# Patient Record
Sex: Female | Born: 1940
Health system: Southern US, Community
[De-identification: ages and names within clinical notes are randomized; demographics above are authoritative.]

## PROBLEM LIST (undated history)

## (undated) DIAGNOSIS — Z973 Presence of spectacles and contact lenses: Secondary | ICD-10-CM

## (undated) DIAGNOSIS — R351 Nocturia: Secondary | ICD-10-CM

## (undated) DIAGNOSIS — R0600 Dyspnea, unspecified: Secondary | ICD-10-CM

## (undated) DIAGNOSIS — C679 Malignant neoplasm of bladder, unspecified: Secondary | ICD-10-CM

## (undated) DIAGNOSIS — R7989 Other specified abnormal findings of blood chemistry: Secondary | ICD-10-CM

## (undated) DIAGNOSIS — I2692 Saddle embolus of pulmonary artery without acute cor pulmonale: Secondary | ICD-10-CM

## (undated) DIAGNOSIS — R319 Hematuria, unspecified: Secondary | ICD-10-CM

## (undated) DIAGNOSIS — I2699 Other pulmonary embolism without acute cor pulmonale: Secondary | ICD-10-CM

## (undated) DIAGNOSIS — R778 Other specified abnormalities of plasma proteins: Secondary | ICD-10-CM

## (undated) DIAGNOSIS — R350 Frequency of micturition: Secondary | ICD-10-CM

## (undated) DIAGNOSIS — R300 Dysuria: Secondary | ICD-10-CM

## (undated) DIAGNOSIS — I10 Essential (primary) hypertension: Secondary | ICD-10-CM

## (undated) DIAGNOSIS — D649 Anemia, unspecified: Secondary | ICD-10-CM

## (undated) DIAGNOSIS — R0609 Other forms of dyspnea: Secondary | ICD-10-CM

## (undated) DIAGNOSIS — I2510 Atherosclerotic heart disease of native coronary artery without angina pectoris: Secondary | ICD-10-CM

## (undated) DIAGNOSIS — Z972 Presence of dental prosthetic device (complete) (partial): Secondary | ICD-10-CM

## (undated) DIAGNOSIS — I272 Pulmonary hypertension, unspecified: Secondary | ICD-10-CM

## (undated) HISTORY — DX: Pulmonary hypertension, unspecified: I27.20

## (undated) HISTORY — DX: Essential (primary) hypertension: I10

## (undated) HISTORY — DX: Other specified abnormalities of plasma proteins: R77.8

## (undated) HISTORY — DX: Other pulmonary embolism without acute cor pulmonale: I26.99

## (undated) HISTORY — DX: Anemia, unspecified: D64.9

## (undated) HISTORY — DX: Dyspnea, unspecified: R06.00

## (undated) HISTORY — DX: Malignant neoplasm of bladder, unspecified: C67.9

## (undated) HISTORY — DX: Other specified abnormal findings of blood chemistry: R79.89

## (undated) HISTORY — DX: Other forms of dyspnea: R06.09

## (undated) HISTORY — DX: Saddle embolus of pulmonary artery without acute cor pulmonale: I26.92

## (undated) HISTORY — DX: Atherosclerotic heart disease of native coronary artery without angina pectoris: I25.10

## (undated) HISTORY — PX: NO PAST SURGERIES: SHX2092

---

## 2014-10-30 ENCOUNTER — Ambulatory Visit: Payer: Self-pay | Admitting: Internal Medicine

## 2014-11-12 ENCOUNTER — Encounter: Payer: Self-pay | Admitting: *Deleted

## 2014-11-27 ENCOUNTER — Encounter: Payer: Self-pay | Admitting: Internal Medicine

## 2014-11-27 ENCOUNTER — Ambulatory Visit (INDEPENDENT_AMBULATORY_CARE_PROVIDER_SITE_OTHER): Payer: PPO | Admitting: Internal Medicine

## 2014-11-27 ENCOUNTER — Ambulatory Visit: Payer: Self-pay | Admitting: Internal Medicine

## 2014-11-27 VITALS — BP 140/100 | HR 75 | Temp 98.0°F | Resp 20 | Ht 63.5 in | Wt 175.0 lb

## 2014-11-27 DIAGNOSIS — Z23 Encounter for immunization: Secondary | ICD-10-CM | POA: Diagnosis not present

## 2014-11-27 DIAGNOSIS — I10 Essential (primary) hypertension: Secondary | ICD-10-CM

## 2014-11-27 DIAGNOSIS — N814 Uterovaginal prolapse, unspecified: Secondary | ICD-10-CM | POA: Diagnosis not present

## 2014-11-27 DIAGNOSIS — R319 Hematuria, unspecified: Secondary | ICD-10-CM | POA: Diagnosis not present

## 2014-11-27 HISTORY — DX: Essential (primary) hypertension: I10

## 2014-11-27 MED ORDER — AMLODIPINE BESYLATE 10 MG PO TABS: 10.0000 mg | ORAL_TABLET | Freq: Every day | ORAL | Status: DC

## 2014-11-27 NOTE — Progress Notes (Signed)
Patient ID: Catherine Snyder, female   DOB: Mar 20, 1940, 74 y.o.   MRN: [id number]    Location:    [facility]   Place of Service:  OFFICE    Advanced Directive information Does patient have an advance directive?: No, Would patient like information on creating an advanced directive?: Yes - Educational materials given  Chief Complaint  Patient presents with  . Establish Care    New patient Establish care  . Medical Management of Chronic Issues    Hypertensiion    HPI:  74 yo female seen today as a new pt. She has a hx HTN but has been out of med x several weeks. She was taking amlodipine 10mg  daily.   She has intermittent hematuria (passes blood clot) at end of urine stream x 1 yr. No dysuria but has increased frequency. Non smoker. She has never seen a urologist. She c/o prolapse uterus and would like to be seen by GYN.  She relocated from [place] in March 2015 and no labs since then  Past Medical History  Diagnosis Date  . Hypertension     History reviewed. No pertinent past surgical history.  Patient Care Team: Gildardo Cranker, DO as PCP - General (Internal Medicine)  Social History   Social History  . Marital Status: Divorced    Spouse Name: N/A  . Number of Children: N/A  . Years of Education: N/A   Occupational History  . Not on file.   Social History Main Topics  . Smoking status: Never Smoker   . Smokeless tobacco: Never Used  . Alcohol Use: No  . Drug Use: Not on file  . Sexual Activity: Not on file   Other Topics Concern  . Not on file   Social History Narrative    Diet:   Do you drink/eat things with caffeine? Yes coffee   Marital status: Divorced                              What year were you married?    Do you live in a house, apartment, assisted living, condo, trailer, etc)? House   Is it one or more stories? 1 story   How many persons live in your home? 4   Do you have any pets in your home? 0   Current or past profession: Dietitian   Do you exercise?  Yes                                       Type & how often:  Walk daily   Do you have a living will? No   Do you have a DNR Form? No   Do you have a POA/HPOA forms? No     reports that she has never smoked. She has never used smokeless tobacco. She reports that she does not drink alcohol. Her drug history is not on file.  Family History  Problem Relation Age of Onset  . Heart disease Father    Family Status  Relation Status Death Age  . Mother Deceased   . Father Deceased   . Sister Alive   . Brother Alive   . Daughter Alive   . Son Alive   . Sister Alive   . Sister Alive   . Brother Deceased   . Brother Deceased   . Brother Deceased   .  Brother Deceased   . Brother Deceased      There is no immunization history on file for this patient.  No Known Allergies  Medications: Patient's Medications  New Prescriptions   No medications on file  Previous Medications   AMLODIPINE (NORVASC) 10 MG TABLET    Take 10 mg by mouth daily.   ASPIRIN 81 MG CHEWABLE TABLET    Chew 81 mg by mouth daily.   CHOLECALCIFEROL (VITAMIN D) 1000 UNITS TABLET    Take 1,000 Units by mouth daily.   IBUPROFEN (ADVIL,MOTRIN) 200 MG TABLET    Take 200 mg by mouth as needed.   MULTIPLE VITAMINS-MINERALS (ONE-A-DAY WOMENS 50+ ADVANTAGE) TABS    Take 1 tablet by mouth daily.  Modified Medications   No medications on file  Discontinued Medications   No medications on file    Review of Systems  Constitutional: Negative for fever, chills, diaphoresis, activity change, appetite change and fatigue.  HENT: Positive for dental problem (dentures; upper). Negative for ear pain and sore throat.   Eyes: Positive for visual disturbance (wears glasses; has cats).  Respiratory: Negative for cough, chest tightness and shortness of breath.   Cardiovascular: Negative for chest pain, palpitations and leg swelling.  Gastrointestinal: Negative for nausea, vomiting, abdominal pain, diarrhea, constipation and blood in  stool.  Genitourinary: Positive for frequency and hematuria. Negative for dysuria.  Musculoskeletal: Positive for back pain. Negative for arthralgias.  Neurological: Negative for dizziness, tremors, numbness and headaches.  Psychiatric/Behavioral: Negative for sleep disturbance. The patient is not nervous/anxious.     Filed Vitals:   11/27/14 0854  BP: 140/100  Pulse: 75  Temp: 98 F (36.7 C)  TempSrc: Oral  Resp: 20  Height: 5' 3.5" (1.613 m)  Weight: 175 lb (79.379 kg)  SpO2: 97%   Body mass index is 30.51 kg/(m^2).  Physical Exam  Constitutional: She is oriented to person, place, and time. She appears well-developed and well-nourished.  HENT:  Mouth/Throat: Oropharynx is clear and moist. No oropharyngeal exudate.  Eyes: Pupils are equal, round, and reactive to light. No scleral icterus.  Neck: Neck supple. Carotid bruit is not present. No tracheal deviation present. No thyromegaly present.  Cardiovascular: Normal rate, regular rhythm, normal heart sounds and intact distal pulses.  Exam reveals no gallop and no friction rub.   No murmur heard. trace LE edema b/l. no calf TTP.   Pulmonary/Chest: Effort normal and breath sounds normal. No stridor. No respiratory distress. She has no wheezes. She has no rales.  Abdominal: Soft. Bowel sounds are normal. She exhibits no distension and no mass. There is no hepatomegaly. There is no tenderness. There is no rebound and no guarding.  Lymphadenopathy:    She has no cervical adenopathy.  Neurological: She is alert and oriented to person, place, and time.  Skin: Skin is warm and dry. No rash noted.  Psychiatric: She has a normal mood and affect. Her behavior is normal. Judgment and thought content normal.     Labs reviewed: No results found for any previous visit.  No results found.   Assessment/Plan   ICD-9-CM ICD-10-CM   1. Essential hypertension - uncontrolled; off meds 401.9 I10 amLODipine (NORVASC) 10 MG tablet     CBC  with Differential     CMP     TSH     Lipid Panel  2. Hematuria - etiology unknown but possibly due to #3 599.70 R31.9 CMP     TSH  3. Prolapsed uterus 618.1 N81.4 Ambulatory  referral to Gynecology     Urinalysis with Reflex Microscopic    --flu shot today  --cont current meds as ordered  --fasting labs today  --f/u in 1 mo for CPE/MMSE/ECG  Lashawna Poche S. Perlie Gold  [facility] [address] [place], [place] [address] [phone] Cell (Monday-Friday 8 AM - 5 PM) [phone] After 5 PM and follow prompts

## 2014-11-27 NOTE — Patient Instructions (Signed)
Flu shot given today  Take all medications as ordered  Follow up in 1 month for CPE  Will call with lab results/GYN referral

## 2014-11-28 LAB — COMPREHENSIVE METABOLIC PANEL
ALBUMIN: 4.1 g/dL (ref 3.5–4.8)
ALT: 16 IU/L (ref 0–32)
AST: 24 IU/L (ref 0–40)
Albumin/Globulin Ratio: 1.3 (ref 1.1–2.5)
Alkaline Phosphatase: 110 IU/L (ref 39–117)
BILIRUBIN TOTAL: 0.5 mg/dL (ref 0.0–1.2)
BUN / CREAT RATIO: 18 (ref 11–26)
BUN: 13 mg/dL (ref 8–27)
CALCIUM: 9.5 mg/dL (ref 8.7–10.3)
CHLORIDE: 102 mmol/L (ref 97–108)
CO2: 23 mmol/L (ref 18–29)
Creatinine, Ser: 0.73 mg/dL (ref 0.57–1.00)
GFR, EST AFRICAN AMERICAN: 95 mL/min/{1.73_m2} (ref >59)
GFR, EST NON AFRICAN AMERICAN: 83 mL/min/{1.73_m2} (ref >59)
GLUCOSE: 111 mg/dL — AB (ref 65–99)
Globulin, Total: 3.2 g/dL (ref 1.5–4.5)
Potassium: 3.9 mmol/L (ref 3.5–5.2)
Sodium: 142 mmol/L (ref 134–144)
TOTAL PROTEIN: 7.3 g/dL (ref 6.0–8.5)

## 2014-11-28 LAB — MICROSCOPIC EXAMINATION
Casts: NONE SEEN /lpf
RBC, UA: >30 /hpf — AB (ref 0–?)

## 2014-11-28 LAB — CBC WITH DIFFERENTIAL/PLATELET
BASOS: 1 %
Basophils Absolute: 0 10*3/uL (ref 0.0–0.2)
EOS (ABSOLUTE): 0.1 10*3/uL (ref 0.0–0.4)
Eos: 3 %
HEMOGLOBIN: 12.7 g/dL (ref 11.1–15.9)
Hematocrit: 37.9 % (ref 34.0–46.6)
IMMATURE GRANS (ABS): 0 10*3/uL (ref 0.0–0.1)
Immature Granulocytes: 0 %
LYMPHS: 36 %
Lymphocytes Absolute: 1.3 10*3/uL (ref 0.7–3.1)
MCH: 28.9 pg (ref 26.6–33.0)
MCHC: 33.5 g/dL (ref 31.5–35.7)
MCV: 86 fL (ref 79–97)
MONOCYTES: 11 %
Monocytes Absolute: 0.4 10*3/uL (ref 0.1–0.9)
NEUTROS ABS: 1.8 10*3/uL (ref 1.4–7.0)
Neutrophils: 49 %
Platelets: 258 10*3/uL (ref 150–379)
RBC: 4.39 x10E6/uL (ref 3.77–5.28)
RDW: 14.6 % (ref 12.3–15.4)
WBC: 3.6 10*3/uL (ref 3.4–10.8)

## 2014-11-28 LAB — URINALYSIS, ROUTINE W REFLEX MICROSCOPIC
Bilirubin, UA: NEGATIVE
Glucose, UA: NEGATIVE
Ketones, UA: NEGATIVE
Leukocytes, UA: NEGATIVE
NITRITE UA: NEGATIVE
PH UA: 5.5 (ref 5.0–7.5)
Specific Gravity, UA: 1.017 (ref 1.005–1.030)
UUROB: 0.2 mg/dL (ref 0.2–1.0)

## 2014-11-28 LAB — LIPID PANEL
CHOLESTEROL TOTAL: 182 mg/dL (ref 100–199)
Chol/HDL Ratio: 4.2 ratio units (ref 0.0–4.4)
HDL: 43 mg/dL (ref >39)
LDL CALC: 113 mg/dL — AB (ref 0–99)
TRIGLYCERIDES: 128 mg/dL (ref 0–149)
VLDL CHOLESTEROL CAL: 26 mg/dL (ref 5–40)

## 2014-11-28 LAB — TSH: TSH: 0.914 u[IU]/mL (ref 0.450–4.500)

## 2014-12-05 ENCOUNTER — Other Ambulatory Visit: Payer: Self-pay

## 2014-12-05 DIAGNOSIS — R319 Hematuria, unspecified: Secondary | ICD-10-CM

## 2015-01-03 ENCOUNTER — Encounter: Payer: Self-pay | Admitting: Internal Medicine

## 2015-02-26 ENCOUNTER — Encounter: Payer: PPO | Admitting: Internal Medicine

## 2015-04-04 ENCOUNTER — Encounter: Payer: Self-pay | Admitting: Internal Medicine

## 2015-04-04 ENCOUNTER — Ambulatory Visit (INDEPENDENT_AMBULATORY_CARE_PROVIDER_SITE_OTHER): Payer: PPO | Admitting: Internal Medicine

## 2015-04-04 VITALS — BP 132/92 | HR 68 | Temp 98.1°F | Resp 20 | Ht 64.0 in | Wt 171.0 lb

## 2015-04-04 DIAGNOSIS — I10 Essential (primary) hypertension: Secondary | ICD-10-CM

## 2015-04-04 DIAGNOSIS — R319 Hematuria, unspecified: Secondary | ICD-10-CM

## 2015-04-04 DIAGNOSIS — H60391 Other infective otitis externa, right ear: Secondary | ICD-10-CM

## 2015-04-04 LAB — POCT URINALYSIS DIPSTICK

## 2015-04-04 MED ORDER — NEOMYCIN-POLYMYXIN-HC 3.5-10000-1 OT SOLN: 4.0000 [drp] | Freq: Three times a day (TID) | OTIC | Status: DC

## 2015-04-04 NOTE — Progress Notes (Signed)
Patient ID: Catherine Snyder, female   DOB: March 12, 1941, 75 y.o.   MRN: [id number]    Location:    [facility]   Place of Service:   OFFICE  Chief Complaint  Patient presents with  . Acute Visit    Patients c/o blood in urine patient says she is also passing clots thruogh her urine and having pain in right  ear  . OTHER    Daughter in room with patient    HPI:  75 yo female seen today for gross hematuria. She says urine is dark colored (cola-like) all week. She denies N/V, abdominal pain, f/c, dysuria, urgency/frequency. She saw GYN and no further w/u needed. No hx kidney problems. She has HTN. No personal hx tobacco abuse. She cancelled her appt to see urology when it was scheduled after her last visit in September 2016.  She also c/o episode of severe right otalgia. No hearing loss. (+) tinnitus. No d/c.  Daughter and infant great granddaughter present  Past Medical History  Diagnosis Date  . Hypertension     History reviewed. No pertinent past surgical history.  Patient Care Team: Gildardo Cranker, DO as PCP - General (Internal Medicine)  Social History   Social History  . Marital Status: Divorced    Spouse Name: N/A  . Number of Children: N/A  . Years of Education: N/A   Occupational History  . Not on file.   Social History Main Topics  . Smoking status: Never Smoker   . Smokeless tobacco: Never Used  . Alcohol Use: No  . Drug Use: Not on file  . Sexual Activity: Not on file   Other Topics Concern  . Not on file   Social History Narrative    Diet:   Do you drink/eat things with caffeine? Yes coffee   Marital status: Divorced                              What year were you married?    Do you live in a house, apartment, assisted living, condo, trailer, etc)? House   Is it one or more stories? 1 story   How many persons live in your home? 4   Do you have any pets in your home? 0   Current or past profession: Dietitian   Do you exercise? Yes                                        Type & how often:  Walk daily   Do you have a living will? No   Do you have a DNR Form? No   Do you have a POA/HPOA forms? No     reports that she has never smoked. She has never used smokeless tobacco. She reports that she does not drink alcohol. Her drug history is not on file.  No Known Allergies  Medications: Patient's Medications  New Prescriptions   No medications on file  Previous Medications   AMLODIPINE (NORVASC) 10 MG TABLET    Take 1 tablet (10 mg total) by mouth daily.   ASPIRIN 81 MG CHEWABLE TABLET    Chew 81 mg by mouth daily.   CHOLECALCIFEROL (VITAMIN D) 1000 UNITS TABLET    Take 1,000 Units by mouth daily.   IBUPROFEN (ADVIL,MOTRIN) 200 MG TABLET    Take 200 mg by mouth as  needed.   MULTIPLE VITAMINS-MINERALS (ONE-A-DAY WOMENS 50+ ADVANTAGE) TABS    Take 1 tablet by mouth daily.  Modified Medications   No medications on file  Discontinued Medications   No medications on file    Review of Systems  HENT: Positive for tinnitus.   Genitourinary: Positive for hematuria.  All other systems reviewed and are negative.   Filed Vitals:   04/04/15 1141  BP: 132/92  Pulse: 68  Temp: 98.1 F (36.7 C)  TempSrc: Oral  Resp: 20  Height: 5\' 4"  (1.626 m)  Weight: 171 lb (77.565 kg)  SpO2: 97%   Body mass index is 29.34 kg/(m^2).  Physical Exam  Constitutional: She is oriented to person, place, and time. She appears well-developed and well-nourished. No distress.  HENT:  Left TM airfluid level but no redness of d/c in external ear canal. Right external ear canal swelling with yellowish d/c and TTP. TM intact b/l without bulging  Cardiovascular: Normal rate, regular rhythm and intact distal pulses.  Exam reveals no gallop and no friction rub.   Murmur (1/6 SEM) heard. No LE edema b/l. No calf TTP  Pulmonary/Chest: Effort normal and breath sounds normal. No respiratory distress. She has no wheezes. She has no rales. She exhibits no tenderness.  Abdominal:  Soft. Bowel sounds are normal. She exhibits no distension and no mass. There is no tenderness (no CVAT or suprapubic TTP). There is no rebound and no guarding.  No pulsatile mass or bruit noted  Neurological: She is oriented to person, place, and time.  Skin: Skin is warm and dry. No rash noted.  Psychiatric: She has a normal mood and affect. Her behavior is normal. Thought content normal.     Labs reviewed: No visits with results within 3 Month(s) from this visit. Latest known visit with results is:  Office Visit on 11/27/2014  Component Date Value Ref Range Status  . WBC 11/27/2014 3.6  3.4 - 10.8 x10E3/uL Final  . RBC 11/27/2014 4.39  3.77 - 5.28 x10E6/uL Final  . Hemoglobin 11/27/2014 12.7  11.1 - 15.9 g/dL Final  . Hematocrit 11/27/2014 37.9  34.0 - 46.6 % Final  . MCV 11/27/2014 86  79 - 97 fL Final  . MCH 11/27/2014 28.9  26.6 - 33.0 pg Final  . MCHC 11/27/2014 33.5  31.5 - 35.7 g/dL Final  . RDW 11/27/2014 14.6  12.3 - 15.4 % Final  . Platelets 11/27/2014 258  150 - 379 x10E3/uL Final  . Neutrophils 11/27/2014 49   Final  . Lymphs 11/27/2014 36   Final  . Monocytes 11/27/2014 11   Final  . Eos 11/27/2014 3   Final  . Basos 11/27/2014 1   Final  . Neutrophils Absolute 11/27/2014 1.8  1.4 - 7.0 x10E3/uL Final  . Lymphocytes Absolute 11/27/2014 1.3  0.7 - 3.1 x10E3/uL Final  . Monocytes Absolute 11/27/2014 0.4  0.1 - 0.9 x10E3/uL Final  . EOS (ABSOLUTE) 11/27/2014 0.1  0.0 - 0.4 x10E3/uL Final  . Basophils Absolute 11/27/2014 0.0  0.0 - 0.2 x10E3/uL Final  . Immature Granulocytes 11/27/2014 0   Final  . Immature Grans (Abs) 11/27/2014 0.0  0.0 - 0.1 x10E3/uL Final  . Glucose 11/27/2014 111* 65 - 99 mg/dL Final  . BUN 11/27/2014 13  8 - 27 mg/dL Final  . Creatinine, Ser 11/27/2014 0.73  0.57 - 1.00 mg/dL Final  . GFR calc non Af Amer 11/27/2014 83  >59 mL/min/1.73 Final  . GFR calc Af Amer 11/27/2014 95  >  59 mL/min/1.73 Final  . BUN/Creatinine Ratio 11/27/2014 18  11 - 26  Final  . Sodium 11/27/2014 142  134 - 144 mmol/L Final  . Potassium 11/27/2014 3.9  3.5 - 5.2 mmol/L Final  . Chloride 11/27/2014 102  97 - 108 mmol/L Final  . CO2 11/27/2014 23  18 - 29 mmol/L Final  . Calcium 11/27/2014 9.5  8.7 - 10.3 mg/dL Final  . Total Protein 11/27/2014 7.3  6.0 - 8.5 g/dL Final  . Albumin 11/27/2014 4.1  3.5 - 4.8 g/dL Final  . Globulin, Total 11/27/2014 3.2  1.5 - 4.5 g/dL Final  . Albumin/Globulin Ratio 11/27/2014 1.3  1.1 - 2.5 Final  . Bilirubin Total 11/27/2014 0.5  0.0 - 1.2 mg/dL Final  . Alkaline Phosphatase 11/27/2014 110  39 - 117 IU/L Final  . AST 11/27/2014 24  0 - 40 IU/L Final  . ALT 11/27/2014 16  0 - 32 IU/L Final  . TSH 11/27/2014 0.914  0.450 - 4.500 uIU/mL Final  . Specific Gravity, UA 11/27/2014 1.017  1.005 - 1.030 Final  . pH, UA 11/27/2014 5.5  5.0 - 7.5 Final  . Color, UA 11/27/2014 Yellow  Yellow Final  . Appearance Ur 11/27/2014 Cloudy* Clear Final  . Leukocytes, UA 11/27/2014 Negative  Negative Final  . Protein, UA 11/27/2014 1+* Negative/Trace Final  . Glucose, UA 11/27/2014 Negative  Negative Final  . Ketones, UA 11/27/2014 Negative  Negative Final  . RBC, UA 11/27/2014 3+* Negative Final  . Bilirubin, UA 11/27/2014 Negative  Negative Final  . Urobilinogen, Ur 11/27/2014 0.2  0.2 - 1.0 mg/dL Final  . Nitrite, UA 11/27/2014 Negative  Negative Final  . Microscopic Examination 11/27/2014 See below:   Final   Microscopic was indicated and was performed.  . Cholesterol, Total 11/27/2014 182  100 - 199 mg/dL Final  . Triglycerides 11/27/2014 128  0 - 149 mg/dL Final  . HDL 11/27/2014 43  >39 mg/dL Final   Comment: According to ATP-III Guidelines, HDL-C >59 mg/dL is considered a negative risk factor for CHD.   Marland Kitchen VLDL Cholesterol Cal 11/27/2014 26  5 - 40 mg/dL Final  . LDL Calculated 11/27/2014 113* 0 - 99 mg/dL Final  . Chol/HDL Ratio 11/27/2014 4.2  0.0 - 4.4 ratio units Final   Comment:                                   T.  Chol/HDL Ratio                                             Men  Women                               1/2 Avg.Risk  3.4    3.3                                   Avg.Risk  5.0    4.4                                2X Avg.Risk  9.6    7.1  3X Avg.Risk 23.4   11.0   . WBC, UA 11/27/2014 0-5  0 -  5 /hpf Final  . RBC, UA 11/27/2014 >30* 0 -  2 /hpf Final  . Epithelial Cells (non renal) 11/27/2014 0-10  0 - 10 /hpf Final  . Casts 11/27/2014 None seen  None seen /lpf Final  . Mucus, UA 11/27/2014 Present  Not Estab. Final  . Bacteria, UA 11/27/2014 Many* None seen/Few Final    No results found.   Assessment/Plan   ICD-9-CM ICD-10-CM   1. Hematuria 599.70 [id number] Basic Metabolic Panel     CBC with Differential  2. Otitis, externa, infective, right 380.10 H60.391 neomycin-polymyxin-hydrocortisone (CORTISPORIN) otic solution  3. Essential hypertension 401.9 [id number] Basic Metabolic Panel   Use ear drops for 1 week  Continue current medications as ordered  Will call with urology referral. Emphasized importance of her following through with appt. She verbalized understanding  Follow up as scheduled for CPE  [facility] S. Perlie Gold  [facility] and [facility] [address] [place], [place] [address] [phone] Cell (Monday-Friday 8 AM - 5 PM) [phone] After 5 PM and follow prompts

## 2015-04-04 NOTE — Patient Instructions (Signed)
Continue current medications as ordered  Will call with urology referral  Follow up as scheduled for CPE

## 2015-04-05 LAB — CBC WITH DIFFERENTIAL/PLATELET
Basophils Absolute: 0 10*3/uL (ref 0.0–0.2)
Basos: 0 %
EOS (ABSOLUTE): 0.1 10*3/uL (ref 0.0–0.4)
Eos: 2 %
HEMATOCRIT: 37.7 % (ref 34.0–46.6)
HEMOGLOBIN: 12.8 g/dL (ref 11.1–15.9)
Immature Grans (Abs): 0 10*3/uL (ref 0.0–0.1)
Immature Granulocytes: 0 %
LYMPHS ABS: 1.3 10*3/uL (ref 0.7–3.1)
Lymphs: 32 %
MCH: 29.8 pg (ref 26.6–33.0)
MCHC: 34 g/dL (ref 31.5–35.7)
MCV: 88 fL (ref 79–97)
Monocytes Absolute: 0.5 10*3/uL (ref 0.1–0.9)
Monocytes: 11 %
NEUTROS ABS: 2.2 10*3/uL (ref 1.4–7.0)
Neutrophils: 55 %
Platelets: 306 10*3/uL (ref 150–379)
RBC: 4.3 x10E6/uL (ref 3.77–5.28)
RDW: 14.9 % (ref 12.3–15.4)
WBC: 4.1 10*3/uL (ref 3.4–10.8)

## 2015-04-05 LAB — BASIC METABOLIC PANEL
BUN / CREAT RATIO: 18 (ref 11–26)
BUN: 12 mg/dL (ref 8–27)
CHLORIDE: 101 mmol/L (ref 96–106)
CO2: 24 mmol/L (ref 18–29)
Calcium: 9.3 mg/dL (ref 8.7–10.3)
Creatinine, Ser: 0.67 mg/dL (ref 0.57–1.00)
GFR calc Af Amer: 101 mL/min/{1.73_m2} (ref >59)
GFR calc non Af Amer: 87 mL/min/{1.73_m2} (ref >59)
GLUCOSE: 106 mg/dL — AB (ref 65–99)
Potassium: 3.8 mmol/L (ref 3.5–5.2)
SODIUM: 142 mmol/L (ref 134–144)

## 2015-04-06 LAB — URINE CULTURE: Organism ID, Bacteria: NO GROWTH

## 2015-05-07 ENCOUNTER — Encounter: Payer: Self-pay | Admitting: Internal Medicine

## 2015-05-07 ENCOUNTER — Ambulatory Visit (INDEPENDENT_AMBULATORY_CARE_PROVIDER_SITE_OTHER): Payer: PPO | Admitting: Internal Medicine

## 2015-05-07 VITALS — BP 138/80 | HR 68 | Temp 98.1°F | Resp 20 | Ht 64.0 in | Wt 174.8 lb

## 2015-05-07 DIAGNOSIS — I10 Essential (primary) hypertension: Secondary | ICD-10-CM | POA: Diagnosis not present

## 2015-05-07 DIAGNOSIS — Z0000 Encounter for general adult medical examination without abnormal findings: Secondary | ICD-10-CM | POA: Diagnosis not present

## 2015-05-07 DIAGNOSIS — R319 Hematuria, unspecified: Secondary | ICD-10-CM | POA: Diagnosis not present

## 2015-05-07 NOTE — Patient Instructions (Signed)
Encouraged her to exercise 30-45 minutes 4-5 times per week. Eat a well balanced diet. Avoid smoking. Limit alcohol intake. Wear seatbelt when riding in the car. Wear sun block (SPF >50) when spending extended times outside.  Follow up in 6 mos for routine visit.   Keep appt with urology as scheduled

## 2015-05-07 NOTE — Progress Notes (Signed)
Patient ID: Catherine Snyder, female   DOB: [birth year], 76 y.o.   MRN: [id number] Subjective:     Catherine Snyder is a 75 y.o. female and is here for a comprehensive physical exam. The patient reports problems - dysuria withoccasional clots in urine. She has appt to see urology on March 13th.  HTN - BP controlled on amlodipine. She takes ASA daily  She has intermittent hematuria (passes blood clot) at end of urine stream x 1 yr. No dysuria but has increased frequency. Non smoker.   She relocated from [place] in March 2015  Past Medical History  Diagnosis Date  . Hypertension    No past surgical history on file. Family History  Problem Relation Age of Onset  . Heart disease Father     Social History   Social History  . Marital Status: Divorced    Spouse Name: N/A  . Number of Children: N/A  . Years of Education: N/A   Occupational History  . Not on file.   Social History Main Topics  . Smoking status: Never Smoker   . Smokeless tobacco: Never Used  . Alcohol Use: No  . Drug Use: Not on file  . Sexual Activity: Not on file   Other Topics Concern  . Not on file   Social History Narrative    Diet:   Do you drink/eat things with caffeine? Yes coffee   Marital status: Divorced                              What year were you married?    Do you live in a house, apartment, assisted living, condo, trailer, etc)? House   Is it one or more stories? 1 story   How many persons live in your home? 4   Do you have any pets in your home? 0   Current or past profession: Dietitian   Do you exercise? Yes                                       Type & how often:  Walk daily   Do you have a living will? No   Do you have a DNR Form? No   Do you have a POA/HPOA forms? No   Health Maintenance  Topic Date Due  . MAMMOGRAM  01/08/1960  . TETANUS/TDAP  01/07/1961  . COLONOSCOPY  01/08/1992  . ZOSTAVAX  01/07/2002  . DEXA SCAN  01/08/2007  . PNA vac Low Risk Adult (1 of 2 - PCV13) 01/08/2007   . INFLUENZA VACCINE  10/14/2015   Current Outpatient Prescriptions on File Prior to Visit  Medication Sig Dispense Refill  . amLODipine (NORVASC) 10 MG tablet Take 1 tablet (10 mg total) by mouth daily. 90 tablet 1  . aspirin 81 MG chewable tablet Chew 81 mg by mouth daily.    . cholecalciferol (VITAMIN D) 1000 UNITS tablet Take 1,000 Units by mouth daily.    Marland Kitchen ibuprofen (ADVIL,MOTRIN) 200 MG tablet Take 200 mg by mouth as needed.    . Multiple Vitamins-Minerals (ONE-A-DAY WOMENS 50+ ADVANTAGE) TABS Take 1 tablet by mouth daily.    Marland Kitchen neomycin-polymyxin-hydrocortisone (CORTISPORIN) otic solution Place 4 drops into the right ear 3 (three) times daily. Use for 1 week 10 mL 0   No current facility-administered medications on file prior to visit.  Review of Systems   Review of Systems  Constitutional: Negative for fever, chills and malaise/fatigue.  HENT: Negative for sore throat and tinnitus.   Eyes: Negative for blurred vision and double vision.  Respiratory: Negative for cough, shortness of breath and wheezing.   Cardiovascular: Negative for chest pain, palpitations, orthopnea and leg swelling.  Gastrointestinal: Negative for heartburn, nausea, vomiting, abdominal pain, diarrhea, constipation and blood in stool.  Genitourinary: Positive for dysuria (at end of urine stream) and hematuria (occasional clots). Negative for urgency and frequency.  Musculoskeletal: Positive for back pain and joint pain. Negative for myalgias and falls.  Skin: Negative for rash.  Neurological: Negative for dizziness, tingling, tremors, sensory change, focal weakness, seizures, loss of consciousness, weakness and headaches.  Endo/Heme/Allergies: Negative for environmental allergies. Does not bruise/bleed easily.  Psychiatric/Behavioral: Negative for depression and memory loss. The patient is not nervous/anxious and does not have insomnia.      Objective:   Filed Vitals:   05/07/15 1500  BP: 138/80   Pulse: 68  Temp: 98.1 F (36.7 C)  TempSrc: Oral  Resp: 20  Height: 5\' 4"  (1.626 m)  Weight: 174 lb 12.8 oz (79.289 kg)  SpO2: 98%       Physical Exam  Constitutional: She is oriented to person, place, and time and well-developed, well-nourished, and in no distress.  HENT:  Head: Normocephalic and atraumatic.  Right Ear: External ear normal.  Left Ear: External ear normal.  Mouth/Throat: Oropharynx is clear and moist. No oropharyngeal exudate.  Eyes: Conjunctivae and EOM are normal. Pupils are equal, round, and reactive to light. No scleral icterus.  Neck: Normal range of motion. Neck supple. Carotid bruit is not present. No tracheal deviation present. No thyromegaly present.  Cardiovascular: Normal rate, regular rhythm, normal heart sounds and intact distal pulses.  Exam reveals no gallop and no friction rub.   No murmur heard. Pulmonary/Chest: Effort normal and breath sounds normal. She has no wheezes. She has no rhonchi. She has no rales. She exhibits no tenderness. Right breast exhibits no inverted nipple, no mass, no nipple discharge, no skin change and no tenderness. Left breast exhibits no inverted nipple, no mass, no nipple discharge, no skin change and no tenderness. Breasts are symmetrical.  Abdominal: Soft. Bowel sounds are normal. She exhibits no distension and no mass. There is no hepatosplenomegaly. There is no tenderness. There is no rebound and no guarding.  Musculoskeletal: Normal range of motion.  Lymphadenopathy:    She has no cervical adenopathy.  Neurological: She is alert and oriented to person, place, and time. She has normal reflexes. Gait normal.  Skin: Skin is warm and dry. No rash noted.  Psychiatric: Mood, memory, affect and judgment normal.   Recent Results (from the past 2160 hour(s))  Basic Metabolic Panel     Status: Abnormal   Collection Time: 04/04/15 12:42 PM  Result Value Ref Range   Glucose 106 (H) 65 - 99 mg/dL   BUN 12 8 - 27 mg/dL    Creatinine, Ser 0.67 0.57 - 1.00 mg/dL   GFR calc non Af Catherine 87 >59 mL/min/1.73   GFR calc Af Catherine 101 >59 mL/min/1.73   BUN/Creatinine Ratio 18 11 - 26   Sodium 142 134 - 144 mmol/L   Potassium 3.8 3.5 - 5.2 mmol/L   Chloride 101 96 - 106 mmol/L   CO2 24 18 - 29 mmol/L   Calcium 9.3 8.7 - 10.3 mg/dL  CBC with Differential     Status: None  Collection Time: 04/04/15 12:42 PM  Result Value Ref Range   WBC 4.1 3.4 - 10.8 x10E3/uL   RBC 4.30 3.77 - 5.28 x10E6/uL   Hemoglobin 12.8 11.1 - 15.9 g/dL   Hematocrit 37.7 34.0 - 46.6 %   MCV 88 79 - 97 fL   MCH 29.8 26.6 - 33.0 pg   MCHC 34.0 31.5 - 35.7 g/dL   RDW 14.9 12.3 - 15.4 %   Platelets 306 150 - 379 x10E3/uL   Neutrophils 55 %   Lymphs 32 %   Monocytes 11 %   Eos 2 %   Basos 0 %   Neutrophils Absolute 2.2 1.4 - 7.0 x10E3/uL   Lymphocytes Absolute 1.3 0.7 - 3.1 x10E3/uL   Monocytes Absolute 0.5 0.1 - 0.9 x10E3/uL   EOS (ABSOLUTE) 0.1 0.0 - 0.4 x10E3/uL   Basophils Absolute 0.0 0.0 - 0.2 x10E3/uL   Immature Granulocytes 0 %   Immature Grans (Abs) 0.0 0.0 - 0.1 x10E3/uL  Urine culture     Status: None   Collection Time: 04/04/15  2:09 PM  Result Value Ref Range   Urine Culture, Routine Final report    Urine Culture result 1 No growth   POC Urinalysis Dipstick     Status: Abnormal   Collection Time: 04/04/15  3:27 PM  Result Value Ref Range   Color, UA dk burgundy     Comment: All values are N/A or left blank due to hematuria.   Clarity, UA opaque    Glucose, UA N/A    Bilirubin, UA N/A    Ketones, UA N/A    Spec Grav, UA     Blood, UA Large    pH, UA     Protein, UA N/A    Urobilinogen, UA     Nitrite, UA N/A    Leukocytes, UA  Negative   ECG OBTAINED AND REVIEWED BY MYSELF: NSR @ 66 bpm, PACs, LAD, LAE, poor R wave progression, no acute ischemic changes. No other ECG available to compare   Assessment:    Healthy female exam.       ICD-9-CM ICD-10-CM   1. Well adult exam V70.0 Z00.00   2. Hematuria 599.70  R31.9 Urinalysis with Reflex Microscopic  3. Essential hypertension 401.9 I10   4. Morbid obesity due to excess calories ([place]) 278.01 E66.01     Plan:     See After Visit Summary for Counseling Recommendations     Pt is UTD on health maintenance. Vaccinations are UTD. Pt maintains a healthy lifestyle. Encouraged pt to exercise 30-45 minutes 4-5 times per week. Eat a well balanced diet. Avoid smoking. Limit alcohol intake. Wear seatbelt when riding in the car. Wear sun block (SPF >50) when spending extended times outside.  Follow up in 6 mos for routine visit.   Keep appt with urology as scheduled  [place] S. Perlie Gold  [facility] [address] [place], [place] [address] [phone] Cell (Monday-Friday 8 AM - 5 PM) [phone] After 5 PM and follow prompts

## 2015-05-08 ENCOUNTER — Other Ambulatory Visit: Payer: Self-pay

## 2015-05-08 LAB — MICROSCOPIC EXAMINATION
CASTS: NONE SEEN /LPF
Epithelial Cells (non renal): NONE SEEN /hpf (ref 0–10)

## 2015-05-08 LAB — URINALYSIS, ROUTINE W REFLEX MICROSCOPIC
BILIRUBIN UA: NEGATIVE
GLUCOSE, UA: NEGATIVE
NITRITE UA: POSITIVE — AB
PH UA: 5.5 (ref 5.0–7.5)
Specific Gravity, UA: 1.027 (ref 1.005–1.030)
UUROB: 1 mg/dL (ref 0.2–1.0)

## 2015-05-08 MED ORDER — FLUCONAZOLE 100 MG PO TABS: ORAL_TABLET | ORAL | Status: DC

## 2015-05-10 LAB — URINE CULTURE

## 2015-05-10 LAB — SPECIMEN STATUS REPORT

## 2015-05-26 DIAGNOSIS — R310 Gross hematuria: Secondary | ICD-10-CM | POA: Diagnosis not present

## 2015-05-26 DIAGNOSIS — Z0000 Encounter for general adult medical examination without abnormal findings: Secondary | ICD-10-CM | POA: Diagnosis not present

## 2015-05-26 DIAGNOSIS — R300 Dysuria: Secondary | ICD-10-CM | POA: Diagnosis not present

## 2015-06-05 DIAGNOSIS — R310 Gross hematuria: Secondary | ICD-10-CM | POA: Diagnosis not present

## 2015-06-10 ENCOUNTER — Other Ambulatory Visit: Payer: Self-pay | Admitting: Urology

## 2015-06-10 ENCOUNTER — Telehealth: Payer: Self-pay | Admitting: *Deleted

## 2015-06-10 NOTE — Telephone Encounter (Signed)
Received fax from [facility], Dr. Nicolette Bang [phone] Fax: [phone] for surgery clearance for Transurethral Resection of bladder. Tumor, Bilateral Retragrade Pyelograms. Scheduled for July 07, 2015-[facility]  Given to Dr. Eulas Post to review

## 2015-06-18 DIAGNOSIS — R310 Gross hematuria: Secondary | ICD-10-CM | POA: Diagnosis not present

## 2015-06-18 DIAGNOSIS — N3289 Other specified disorders of bladder: Secondary | ICD-10-CM | POA: Diagnosis not present

## 2015-06-18 DIAGNOSIS — Z0000 Encounter for general adult medical examination without abnormal findings: Secondary | ICD-10-CM | POA: Diagnosis not present

## 2015-07-01 ENCOUNTER — Encounter (HOSPITAL_BASED_OUTPATIENT_CLINIC_OR_DEPARTMENT_OTHER): Payer: Self-pay | Admitting: *Deleted

## 2015-07-01 NOTE — Progress Notes (Addendum)
SPOKE W/ PT AND DAUGHTER .  NPO AFTER MN W/ EXCEPTION CLEAR LIQUIDS UNTIL 0930 (NO CREAM/ MILK PRODUCTS).  ARRIVE AT 1400.  NEEDS ISTAT.  CURRENT EKG IN CHART AND EPIC.  WILL TAKE NORVASC AM DOS W/ SIPS OF WATER.

## 2015-07-07 ENCOUNTER — Encounter (HOSPITAL_BASED_OUTPATIENT_CLINIC_OR_DEPARTMENT_OTHER)
Admission: RE | Disposition: A | Discharge status: Home / Self Care | Payer: Self-pay | Source: Ambulatory Visit | Attending: Urology

## 2015-07-07 ENCOUNTER — Ambulatory Visit (HOSPITAL_BASED_OUTPATIENT_CLINIC_OR_DEPARTMENT_OTHER): Payer: PPO | Admitting: Anesthesiology

## 2015-07-07 ENCOUNTER — Ambulatory Visit (HOSPITAL_BASED_OUTPATIENT_CLINIC_OR_DEPARTMENT_OTHER)
Admission: RE | Admit: 2015-07-07 | Discharge: 2015-07-07 | Disposition: A | Discharge status: Home / Self Care | Payer: PPO | Source: Ambulatory Visit | Attending: Urology | Admitting: Urology

## 2015-07-07 ENCOUNTER — Encounter (HOSPITAL_BASED_OUTPATIENT_CLINIC_OR_DEPARTMENT_OTHER): Payer: Self-pay | Admitting: *Deleted

## 2015-07-07 DIAGNOSIS — Z7982 Long term (current) use of aspirin: Secondary | ICD-10-CM | POA: Insufficient documentation

## 2015-07-07 DIAGNOSIS — N3289 Other specified disorders of bladder: Secondary | ICD-10-CM | POA: Diagnosis not present

## 2015-07-07 DIAGNOSIS — Z79899 Other long term (current) drug therapy: Secondary | ICD-10-CM | POA: Diagnosis not present

## 2015-07-07 DIAGNOSIS — C678 Malignant neoplasm of overlapping sites of bladder: Secondary | ICD-10-CM | POA: Insufficient documentation

## 2015-07-07 DIAGNOSIS — D494 Neoplasm of unspecified behavior of bladder: Secondary | ICD-10-CM | POA: Diagnosis not present

## 2015-07-07 DIAGNOSIS — I10 Essential (primary) hypertension: Secondary | ICD-10-CM | POA: Diagnosis not present

## 2015-07-07 DIAGNOSIS — C679 Malignant neoplasm of bladder, unspecified: Secondary | ICD-10-CM | POA: Diagnosis not present

## 2015-07-07 HISTORY — DX: Dysuria: R30.0

## 2015-07-07 HISTORY — PX: TRANSURETHRAL RESECTION OF BLADDER TUMOR WITH GYRUS (TURBT-GYRUS): SHX6458

## 2015-07-07 HISTORY — DX: Presence of dental prosthetic device (complete) (partial): Z97.2

## 2015-07-07 HISTORY — PX: CYSTOSCOPY W/ RETROGRADES: SHX1426

## 2015-07-07 HISTORY — DX: Frequency of micturition: R35.0

## 2015-07-07 HISTORY — DX: Nocturia: R35.1

## 2015-07-07 HISTORY — DX: Presence of spectacles and contact lenses: Z97.3

## 2015-07-07 HISTORY — DX: Hematuria, unspecified: R31.9

## 2015-07-07 LAB — POCT I-STAT, CHEM 8
BUN: 13 mg/dL (ref 6–20)
CALCIUM ION: 1.16 mmol/L (ref 1.13–1.30)
CHLORIDE: 107 mmol/L (ref 101–111)
CREATININE: 0.6 mg/dL (ref 0.44–1.00)
GLUCOSE: 96 mg/dL (ref 65–99)
HCT: 31 % — ABNORMAL LOW (ref 36.0–46.0)
HEMOGLOBIN: 10.5 g/dL — AB (ref 12.0–15.0)
POTASSIUM: 3.4 mmol/L — AB (ref 3.5–5.1)
Sodium: 143 mmol/L (ref 135–145)
TCO2: 20 mmol/L (ref 0–100)

## 2015-07-07 SURGERY — TRANSURETHRAL RESECTION OF BLADDER TUMOR WITH GYRUS (TURBT-GYRUS)
Anesthesia: General | Site: Bladder

## 2015-07-07 MED ORDER — LIDOCAINE HCL (CARDIAC) 20 MG/ML IV SOLN
INTRAVENOUS | Status: DC | PRN
  Administered 2015-07-07: 60 mg via INTRAVENOUS

## 2015-07-07 MED ORDER — ONDANSETRON HCL 4 MG/2ML IJ SOLN
INTRAMUSCULAR | Status: AC
  Filled 2015-07-07: qty 2

## 2015-07-07 MED ORDER — IOPAMIDOL (ISOVUE-370) INJECTION 76%
INTRAVENOUS | Status: DC | PRN
  Administered 2015-07-07: 10 mL

## 2015-07-07 MED ORDER — CEFAZOLIN SODIUM-DEXTROSE 2-4 GM/100ML-% IV SOLN
2.0000 g | INTRAVENOUS | Status: AC
  Administered 2015-07-07: 2 g via INTRAVENOUS
  Filled 2015-07-07: qty 100

## 2015-07-07 MED ORDER — PROPOFOL 10 MG/ML IV BOLUS
INTRAVENOUS | Status: DC | PRN
  Administered 2015-07-07: 160 mg via INTRAVENOUS

## 2015-07-07 MED ORDER — MITOMYCIN CHEMO FOR BLADDER INSTILLATION 40 MG
40.0000 mg | Freq: Once | INTRAVENOUS | Status: AC
  Administered 2015-07-07: 40 mg via INTRAVESICAL
  Filled 2015-07-07: qty 40

## 2015-07-07 MED ORDER — CEFAZOLIN SODIUM 1-5 GM-% IV SOLN
1.0000 g | INTRAVENOUS | Status: DC
  Filled 2015-07-07: qty 50

## 2015-07-07 MED ORDER — CEFAZOLIN SODIUM-DEXTROSE 2-4 GM/100ML-% IV SOLN
INTRAVENOUS | Status: AC
  Filled 2015-07-07: qty 100

## 2015-07-07 MED ORDER — FENTANYL CITRATE (PF) 100 MCG/2ML IJ SOLN
INTRAMUSCULAR | Status: DC | PRN
  Administered 2015-07-07 (×4): 25 ug via INTRAVENOUS

## 2015-07-07 MED ORDER — SUCCINYLCHOLINE CHLORIDE 20 MG/ML IJ SOLN
INTRAMUSCULAR | Status: DC | PRN
  Administered 2015-07-07: 100 mg via INTRAVENOUS

## 2015-07-07 MED ORDER — PROPOFOL 10 MG/ML IV BOLUS
INTRAVENOUS | Status: AC
  Filled 2015-07-07: qty 20

## 2015-07-07 MED ORDER — DEXAMETHASONE SODIUM PHOSPHATE 10 MG/ML IJ SOLN
INTRAMUSCULAR | Status: AC
  Filled 2015-07-07: qty 1

## 2015-07-07 MED ORDER — KETOROLAC TROMETHAMINE 30 MG/ML IJ SOLN
INTRAMUSCULAR | Status: DC | PRN
  Administered 2015-07-07: 30 mg via INTRAVENOUS

## 2015-07-07 MED ORDER — LIDOCAINE HCL (CARDIAC) 20 MG/ML IV SOLN
INTRAVENOUS | Status: AC
  Filled 2015-07-07: qty 5

## 2015-07-07 MED ORDER — FENTANYL CITRATE (PF) 100 MCG/2ML IJ SOLN
25.0000 ug | INTRAMUSCULAR | Status: DC | PRN
  Filled 2015-07-07: qty 1

## 2015-07-07 MED ORDER — LACTATED RINGERS IV SOLN
INTRAVENOUS | Status: DC
  Administered 2015-07-07 (×2): via INTRAVENOUS
  Filled 2015-07-07: qty 1000

## 2015-07-07 MED ORDER — ONDANSETRON HCL 4 MG/2ML IJ SOLN
INTRAMUSCULAR | Status: DC | PRN
  Administered 2015-07-07: 4 mg via INTRAVENOUS

## 2015-07-07 MED ORDER — EPHEDRINE SULFATE 50 MG/ML IJ SOLN
INTRAMUSCULAR | Status: DC | PRN
  Administered 2015-07-07 (×2): 10 mg via INTRAVENOUS

## 2015-07-07 MED ORDER — PROMETHAZINE HCL 25 MG/ML IJ SOLN
6.2500 mg | INTRAMUSCULAR | Status: DC | PRN
Start: 2015-07-07 — End: 2015-07-07
  Filled 2015-07-07: qty 1

## 2015-07-07 MED ORDER — DEXAMETHASONE SODIUM PHOSPHATE 4 MG/ML IJ SOLN
INTRAMUSCULAR | Status: DC | PRN
  Administered 2015-07-07: 10 mg via INTRAVENOUS

## 2015-07-07 MED ORDER — HYDROCODONE-ACETAMINOPHEN 5-325 MG PO TABS: 1.0000 | ORAL_TABLET | Freq: Four times a day (QID) | ORAL | Status: DC | PRN

## 2015-07-07 MED ORDER — SODIUM CHLORIDE 0.9 % IR SOLN
Status: DC | PRN
  Administered 2015-07-07: 3000 mL via INTRAVESICAL
  Administered 2015-07-07: 2000 mL via INTRAVESICAL
  Administered 2015-07-07: 1000 mL via INTRAVESICAL

## 2015-07-07 MED ORDER — FENTANYL CITRATE (PF) 100 MCG/2ML IJ SOLN
INTRAMUSCULAR | Status: AC
  Filled 2015-07-07: qty 2

## 2015-07-07 SURGICAL SUPPLY — 24 items
BAG DRAIN URO-CYSTO SKYTR STRL (DRAIN) ×8 IMPLANT
BAG URINE DRAINAGE (UROLOGICAL SUPPLIES) ×4 IMPLANT
BASKET ZERO TIP NITINOL 2.4FR (BASKET) IMPLANT
CATH FOLEY 3WAY 30CC 22F (CATHETERS) ×4 IMPLANT
CATH INTERMIT  6FR 70CM (CATHETERS) ×4 IMPLANT
CLOTH BEACON ORANGE TIMEOUT ST (SAFETY) ×4 IMPLANT
GLOVE BIO SURGEON STRL SZ8 (GLOVE) ×4 IMPLANT
GLOVE SURG SS PI 7.5 STRL IVOR (GLOVE) ×4 IMPLANT
GOWN STRL REUS W/ TWL LRG LVL3 (GOWN DISPOSABLE) ×4 IMPLANT
GOWN STRL REUS W/ TWL XL LVL3 (GOWN DISPOSABLE) ×2 IMPLANT
GOWN STRL REUS W/TWL LRG LVL3 (GOWN DISPOSABLE) ×8 IMPLANT
GOWN STRL REUS W/TWL XL LVL3 (GOWN DISPOSABLE) ×6 IMPLANT
GUIDEWIRE ANG ZIPWIRE 038X150 (WIRE) IMPLANT
GUIDEWIRE STR DUAL SENSOR (WIRE) ×4 IMPLANT
HOLDER FOLEY CATH W/STRAP (MISCELLANEOUS) ×4 IMPLANT
IV NS 1000ML (IV SOLUTION) ×4
IV NS 1000ML BAXH (IV SOLUTION) ×4 IMPLANT
IV NS IRRIG 3000ML ARTHROMATIC (IV SOLUTION) ×4 IMPLANT
KIT ROOM TURNOVER WOR (KITS) ×4 IMPLANT
LOOP CUT BIPOLAR 24F LRG (ELECTROSURGICAL) ×4 IMPLANT
MANIFOLD NEPTUNE II (INSTRUMENTS) ×4 IMPLANT
PACK CYSTO (CUSTOM PROCEDURE TRAY) ×4 IMPLANT
TUBE CONNECTING 12'X1/4 (SUCTIONS) ×1
TUBE CONNECTING 12X1/4 (SUCTIONS) ×3 IMPLANT

## 2015-07-07 NOTE — Transfer of Care (Signed)
Immediate Anesthesia Transfer of Care Note  Patient: Catherine Snyder  Procedure(s) Performed: Procedure(s) (LRB): TRANSURETHRAL RESECTION OF BLADDER TUMOR WITH GYRUS (TURBT-GYRUS) (N/A) CYSTOSCOPY WITH RETROGRADE PYELOGRAM (Bilateral)  Patient Location: PACU  Anesthesia Type: General  Level of Consciousness: awake, sedated, patient cooperative and responds to stimulation  Airway & Oxygen Therapy: Patient Spontanous Breathing and Patient connected to face mask oxygen  Post-op Assessment: Report given to PACU RN, Post -op Vital signs reviewed and stable and Patient moving all extremities  Post vital signs: Reviewed and stable  Complications: No apparent anesthesia complications

## 2015-07-07 NOTE — H&P (Signed)
Urology Admission H&P  Chief Complaint: gross hematuria  History of Present Illness: Catherine Snyder is a 75yo with a 6 month history of gross hematuria who was found on CT scan to have a large bladder tumor. She has dysuria and intermittent gross hematuria.  Past Medical History  Diagnosis Date  . Hypertension   . Bladder tumor   . Hematuria   . Frequency of urination   . Dysuria   . Nocturia   . Wears glasses   . Wears partial dentures     upper   Past Surgical History  Procedure Laterality Date  . No past surgeries      Home Medications:  Prescriptions prior to admission  Medication Sig Dispense Refill Last Dose  . amLODipine (NORVASC) 10 MG tablet Take 1 tablet (10 mg total) by mouth daily. (Patient taking differently: Take 10 mg by mouth every morning. ) 90 tablet 1 07/07/2015 at 0930  . aspirin 81 MG chewable tablet Chew 81 mg by mouth daily.   Past Week at Unknown time  . ibuprofen (ADVIL,MOTRIN) 200 MG tablet Take 200 mg by mouth as needed.   Past Month at Unknown time  . cholecalciferol (VITAMIN D) 1000 UNITS tablet Take 1,000 Units by mouth daily.   More than a month at Unknown time  . Multiple Vitamins-Minerals (ONE-A-DAY WOMENS 50+ ADVANTAGE) TABS Take 1 tablet by mouth daily.   More than a month at Unknown time   Allergies: No Known Allergies  Family History  Problem Relation Age of Onset  . Heart disease Father    Social History:  reports that she has never smoked. She has never used smokeless tobacco. She reports that she does not drink alcohol or use illicit drugs.  Review of Systems  Genitourinary: Positive for dysuria and hematuria.  All other systems reviewed and are negative.   Physical Exam:  Vital signs in last 24 hours: Temp:  [98.3 F (36.8 C)] 98.3 F (36.8 C) (04/24 1258) Pulse Rate:  [79] 79 (04/24 1258) Resp:  [20] 20 (04/24 1258) BP: (144)/(85) 144/85 mmHg (04/24 1258) SpO2:  [100 %] 100 % (04/24 1258) Weight:  [79.833 kg (176 lb)] 79.833 kg  (176 lb) (04/24 1258) Physical Exam  Constitutional: She is oriented to person, place, and time. She appears well-developed and well-nourished.  HENT:  Head: Normocephalic and atraumatic.  Eyes: EOM are normal. Pupils are equal, round, and reactive to light.  Neck: Normal range of motion. No thyromegaly present.  Cardiovascular: Normal rate and regular rhythm.   Respiratory: Effort normal. No respiratory distress.  GI: Soft. She exhibits no distension and no mass. There is no tenderness. There is no rebound and no guarding.  Musculoskeletal: Normal range of motion.  Neurological: She is alert and oriented to person, place, and time.  Skin: Skin is warm and dry.  Psychiatric: She has a normal mood and affect. Her behavior is normal. Judgment and thought content normal.    Laboratory Data:  Results for orders placed or performed during the hospital encounter of 07/07/15 (from the past 24 hour(s))  I-STAT, chem 8     Status: Abnormal   Collection Time: 07/07/15  1:33 PM  Result Value Ref Range   Sodium 143 135 - 145 mmol/L   Potassium 3.4 (L) 3.5 - 5.1 mmol/L   Chloride 107 101 - 111 mmol/L   BUN 13 6 - 20 mg/dL   Creatinine, Ser 0.60 0.44 - 1.00 mg/dL   Glucose, Bld 96 65 -  99 mg/dL   Calcium, Ion 1.16 1.13 - 1.30 mmol/L   TCO2 20 0 - 100 mmol/L   Hemoglobin 10.5 (L) 12.0 - 15.0 g/dL   HCT 31.0 (L) 36.0 - 46.0 %   No results found for this or any previous visit (from the past 240 hour(s)). Creatinine:  Recent Labs  07/07/15 1333  CREATININE 0.60   Baseline Creatinine: 0.6  Impression/Assessment:  74yo with bladder tumor, gross hematuria  Plan:  The risks/benefits/alternatives to TURBT with mitomycin C instillation was explained to the patient and she understands and wishes to proceed with surgery  Nicolette Bang 07/07/2015, 3:08 PM

## 2015-07-07 NOTE — Anesthesia Procedure Notes (Signed)
Procedure Name: Intubation Date/Time: 07/07/2015 3:33 PM Performed by: Justice Rocher Pre-anesthesia Checklist: Patient identified, Emergency Drugs available, Suction available and Patient being monitored Patient Re-evaluated:Patient Re-evaluated prior to inductionOxygen Delivery Method: Circle System Utilized Preoxygenation: Pre-oxygenation with 100% oxygen Intubation Type: IV induction Ventilation: Mask ventilation without difficulty Laryngoscope Size: Mac and 3 Grade View: Grade II Tube type: Oral Tube size: 7.0 mm Number of attempts: 1 Airway Equipment and Method: Stylet and Oral airway Placement Confirmation: ETT inserted through vocal cords under direct vision,  positive ETCO2 and breath sounds checked- equal and bilateral Secured at: 22 cm Tube secured with: Tape Dental Injury: Teeth and Oropharynx as per pre-operative assessment  Comments: 3 teeth intact after intubation as preop

## 2015-07-07 NOTE — Brief Op Note (Signed)
07/07/2015  4:21 PM  PATIENT:  Catherine Snyder  74 y.o. female  PRE-OPERATIVE DIAGNOSIS:  BLADDER TUMOR  POST-OPERATIVE DIAGNOSIS:  BLADDER TUMOR  PROCEDURE:  Procedure(s): TRANSURETHRAL RESECTION OF BLADDER TUMOR WITH GYRUS (TURBT-GYRUS) (N/A) CYSTOSCOPY WITH RETROGRADE PYELOGRAM (Bilateral)  SURGEON:  Surgeon(s) and Role:    * Cleon Gustin, MD - Primary  PHYSICIAN ASSISTANT:   ASSISTANTS: none   ANESTHESIA:   general  EBL:  Total I/O In: 1000 [I.V.:1000] Out: 5 [Blood:5]  BLOOD ADMINISTERED:none  DRAINS: Urinary Catheter (Foley)   LOCAL MEDICATIONS USED:  NONE  SPECIMEN:  Source of Specimen:  right lateral wall tumor, bladder neck tumor, posterior wall tumor  DISPOSITION OF SPECIMEN:  PATHOLOGY  COUNTS:  YES  TOURNIQUET:  * No tourniquets in log *  DICTATION: .Note written in EPIC  PLAN OF CARE: Discharge to home after PACU  PATIENT DISPOSITION:  PACU - hemodynamically stable.   Delay start of Pharmacological VTE agent (>24hrs) due to surgical blood loss or risk of bleeding: not applicable

## 2015-07-07 NOTE — Discharge Instructions (Signed)
Transurethral Procedure  Medications: Resume all your other meds from home  Activity: 1. No heavy lifting > 10 pounds for 2 weeks. 2. No sexual activity for 2 weeks. 3. No strenuous activity for 2 weeks. 4. No driving while on narcotic pain medications. 5. Drink plenty of water. 6. Continue to walk at home - you can still get blood clots when you are at home so keep active but don't over do it. 7. Your urine may have some blood in it - make sure you drink plenty of water. Call or come to the ER immediately if you catheter stops draining or you are unable to urinate.  Bathing: You can shower. You can take a bath unless you have a foley catheter in place.  Signs / Symptoms to call: 1. Call if you have a fever greater than 101.5  2. Uncontrolled nausea / vomiting, uncontrolled pain / dizziness, unable to urinate, leg swelling / leg pain, or any other concerns.   You can reach [place] at [phone]    Foley Catheter Care, Adult A Foley catheter is a soft, flexible tube that is placed into the bladder to drain urine. A Foley catheter may be inserted if:  You leak urine or are not able to control when you urinate (urinary incontinence).  You are not able to urinate when you need to (urinary retention).  You had prostate surgery or surgery on the genitals.  You have certain medical conditions, such as multiple sclerosis, dementia, or a spinal cord injury. If you are going home with a Foley catheter in place, follow the instructions below. TAKING CARE OF THE CATHETER 1. Wash your hands with soap and water. 2. Using mild soap and warm water on a clean washcloth:  Clean the area on your body closest to the catheter insertion site using a circular motion, moving away from the catheter. Never wipe toward the catheter because this could sweep bacteria up into the urethra and cause infection.  Remove all traces of soap. Pat the area dry with a  clean towel. For males, reposition the foreskin. 3. Attach the catheter to your leg so there is no tension on the catheter. Use adhesive tape or a leg strap. If you are using adhesive tape, remove any sticky residue left behind by the previous tape you used. 4. Keep the drainage bag below the level of the bladder, but keep it off the floor. 5. Check throughout the day to be sure the catheter is working and urine is draining freely. Make sure the tubing does not become kinked. 6. Do not pull on the catheter or try to remove it. Pulling could damage internal tissues. TAKING CARE OF THE DRAINAGE BAGS You will be given two drainage bags to take home. One is a large overnight drainage bag, and the other is a smaller leg bag that fits underneath clothing. You may wear the overnight bag at any time, but you should never wear the smaller leg bag at night. Follow the instructions below for how to empty, change, and clean your drainage bags. Emptying the Drainage Bag You must empty your drainage bag when it is  - full or at least 2-3 times a day. 1. Wash your hands with soap and water. 2. Keep the drainage bag below your hips, below the level of your bladder. This stops urine from going back into the tubing and into your bladder. 3. Hold the dirty bag over the toilet or a clean container. 4. Open the pour  spout at the bottom of the bag and empty the urine into the toilet or container. Do not let the pour spout touch the toilet, container, or any other surface. Doing so can place bacteria on the bag, which can cause an infection. 5. Clean the pour spout with a gauze pad or cotton ball that has rubbing alcohol on it. 6. Close the pour spout. 7. Attach the bag to your leg with adhesive tape or a leg strap. 8. Wash your hands well. Changing the Drainage Bag Change your drainage bag once a month or sooner if it starts to smell bad or look dirty. Below are steps to follow when changing the drainage bag. 1. Wash  your hands with soap and water. 2. Pinch off the rubber catheter so that urine does not spill out. 3. Disconnect the catheter tube from the drainage tube at the connection valve. Do not let the tubes touch any surface. 4. Clean the end of the catheter tube with an alcohol wipe. Use a different alcohol wipe to clean the end of the drainage tube. 5. Connect the catheter tube to the drainage tube of the clean drainage bag. 6. Attach the new bag to the leg with adhesive tape or a leg strap. Avoid attaching the new bag too tightly. 7. Wash your hands well. Cleaning the Drainage Bag 1. Wash your hands with soap and water. 2. Wash the bag in warm, soapy water. 3. Rinse the bag thoroughly with warm water. 4. Fill the bag with a solution of white vinegar and water (1 cup vinegar to 1 qt warm water [.2 L vinegar to 1 L warm water]). Close the bag and soak it for 30 minutes in the solution. 5. Rinse the bag with warm water. 6. Hang the bag to dry with the pour spout open and hanging downward. 7. Store the clean bag (once it is dry) in a clean plastic bag. 8. Wash your hands well. PREVENTING INFECTION  Wash your hands before and after handling your catheter.  Take showers daily and wash the area where the catheter enters your body. Do not take baths. Replace wet leg straps with dry ones, if this applies.  Do not use powders, sprays, or lotions on the genital area. Only use creams, lotions, or ointments as directed by your caregiver.  For females, wipe from front to back after each bowel movement.  Drink enough fluids to keep your urine clear or pale yellow unless you have a fluid restriction.  Do not let the drainage bag or tubing touch or lie on the floor.  Wear cotton underwear to absorb moisture and to keep your skin drier. SEEK MEDICAL CARE IF:   Your urine is cloudy or smells unusually bad.  Your catheter becomes clogged.  You are not draining urine into the bag or your bladder feels  full.  Your catheter starts to leak. SEEK IMMEDIATE MEDICAL CARE IF:   You have pain, swelling, redness, or pus where the catheter enters the body.  You have pain in the abdomen, legs, lower back, or bladder.  You have a fever.  You see blood fill the catheter, or your urine is pink or red.  You have nausea, vomiting, or chills.  Your catheter gets pulled out. MAKE SURE YOU:   Understand these instructions.  Will watch your condition.  Will get help right away if you are not doing well or get worse.   This information is not intended to replace advice given to you  by your health care provider. Make sure you discuss any questions you have with your health care provider.   Document Released: 03/01/2005 Document Revised: 07/16/2013 Document Reviewed: 02/21/2012 Elsevier Interactive Patient Education 2016 [place] Anesthesia Home Care Instructions  Activity: Get plenty of rest for the remainder of the day. A responsible adult should stay with you for 24 hours following the procedure.  For the next 24 hours, DO NOT: -Drive a car -Paediatric nurse -Drink alcoholic beverages -Take any medication unless instructed by your physician -Make any legal decisions or sign important papers.  Meals: Start with liquid foods such as gelatin or soup. Progress to regular foods as tolerated. Avoid greasy, spicy, heavy foods. If nausea and/or vomiting occur, drink only clear liquids until the nausea and/or vomiting subsides. Call your physician if vomiting continues.  Special Instructions/Symptoms: Your throat may feel dry or sore from the anesthesia or the breathing tube placed in your throat during surgery. If this causes discomfort, gargle with warm salt water. The discomfort should disappear within 24 hours.  If you had a scopolamine patch placed behind your ear for the management of post- operative nausea and/or vomiting:  1. The medication in the patch is effective for  72 hours, after which it should be removed.  Wrap patch in a tissue and discard in the trash. Wash hands thoroughly with soap and water. 2. You may remove the patch earlier than 72 hours if you experience unpleasant side effects which may include dry mouth, dizziness or visual disturbances. 3. Avoid touching the patch. Wash your hands with soap and water after contact with the patch.

## 2015-07-07 NOTE — Op Note (Signed)
Preoperative diagnosis: Bladder Tumor   Postop diagnosis: Same   Procedure: 1. Cystoscopy  2. Bilateral retrograde pyelography  3. Intra-operative fluoroscopy, under 1 hour, with interpretation  4.Transurethral resection of bladder tumor, large 5cm  5. Instillation of mitomycin C 40mg    Attending: Nicolette Bang   Anesthesia: General   Estimated blood loss: 5 cc   Drains: 1. 22 French Foley catheter   2. Left 6x26 JJ ureteral Stent   Specimens: Bladder tumor   Antibiotics: Ancef   Findings: 5cm papillary bladder tumor involving the right lateral wall. 1cm tumor involving the left bladder neck. 2 1cm tumors on the posterior wall. No filling defects in bilateral collecting system   Indications: Patient is a 75 year old with a history of gross hematuria and a bladder tumor found on CT scan. After discussing treatment options patient decided to proceed with transurethral resection of bladder tumor   Procedure in detail: Prior to procedure consetn was obtained. Patient was brought to the operating room and briefing was done sure correct patient, correct procedure, correct site. General anesthesia was in administered patient was placed in the dorsal lithotomy position. The rigid 33 French cystoscope was passed urethra and bladder. Bladder was inspected masses or lesions and we noted a d 6 cm lesion on the left lateral wall of the bladder involving the right lateral wall, bladder neck and posterior wall. We then cannulated the right ureteral orifice with a 6 French ureteral catheter. A gentle retrograde was obtained in findings noted above. We then turned our attention to the left ureteral orifice. The left ureteral orifice was cannulated with a 6 French ureteral catheter. A gentle retrograde was obtained in findings noted above.  We removed the cystoscope and placed a 27 French resectoscope in the bladder. Using bipolar electrocautery were then removed the 2 bladder tumors. We removed the  bladder tumors down to the base exposing muscle. We then removed the pieces and sent them for pathology. Once good hemostasis was noted the bladder was then drained and a 22 French Foley catheter was placed. We then instilled mitomycin C 40mg  into the bladder and capped the foley. This concluded the procedure which resulted by the patient.   Complications: None   Condition: Stable, extubated, transferred to PACU.   Plan: The patient will be discharged home after 1 hour instillation time for mitomycin. She will followup in 5 days for foley removal.

## 2015-07-07 NOTE — Anesthesia Postprocedure Evaluation (Signed)
Anesthesia Post Note  Patient: Catherine Snyder  Procedure(s) Performed: Procedure(s) (LRB): TRANSURETHRAL RESECTION OF BLADDER TUMOR WITH GYRUS (TURBT-GYRUS) (N/A) CYSTOSCOPY WITH RETROGRADE PYELOGRAM (Bilateral)  Patient location during evaluation: PACU Anesthesia Type: General Level of consciousness: awake and alert Pain management: pain level controlled Vital Signs Assessment: post-procedure vital signs reviewed and stable Respiratory status: spontaneous breathing, nonlabored ventilation, respiratory function stable and patient connected to nasal cannula oxygen Cardiovascular status: blood pressure returned to baseline and stable Postop Assessment: no signs of nausea or vomiting Anesthetic complications: no    Last Vitals:  Filed Vitals:   07/07/15 1645 07/07/15 1700  BP: 137/81 147/88  Pulse: 74 70  Temp:    Resp: 20 16    Last Pain:  Filed Vitals:   07/07/15 1702  PainSc: [place] DAVID

## 2015-07-07 NOTE — Anesthesia Preprocedure Evaluation (Addendum)
Anesthesia Evaluation  Patient identified by MRN, date of birth, ID band Patient awake    Reviewed: Allergy & Precautions, H&P , NPO status , Patient's Chart, lab work & pertinent test results  History of Anesthesia Complications Negative for: history of anesthetic complications  Airway Mallampati: II  TM Distance: >3 FB Neck ROM: full    Dental  (+) Missing, Partial Upper   Pulmonary neg pulmonary ROS,    Pulmonary exam normal breath sounds clear to auscultation       Cardiovascular hypertension, Pt. on medications Normal cardiovascular exam Rhythm:regular Rate:Normal - Friction Rub    Neuro/Psych negative neurological ROS     GI/Hepatic negative GI ROS, Neg liver ROS,   Endo/Other  negative endocrine ROS  Renal/GU negative Renal ROS     Musculoskeletal   Abdominal   Peds  Hematology negative hematology ROS (+)   Anesthesia Other Findings   Reproductive/Obstetrics negative OB ROS                            Anesthesia Physical Anesthesia Plan  ASA: II  Anesthesia Plan: General   Post-op Pain Management:    Induction: Intravenous  Airway Management Planned: Oral ETT  Additional Equipment:   Intra-op Plan:   Post-operative Plan:   Informed Consent: I have reviewed the patients History and Physical, chart, labs and discussed the procedure including the risks, benefits and alternatives for the proposed anesthesia with the patient or authorized representative who has indicated his/her understanding and acceptance.   Dental Advisory Given  Plan Discussed with: Anesthesiologist, CRNA and Surgeon  Anesthesia Plan Comments: (Will intubate for paralysis given surgeon preference)       Anesthesia Quick Evaluation

## 2015-07-08 ENCOUNTER — Encounter (HOSPITAL_BASED_OUTPATIENT_CLINIC_OR_DEPARTMENT_OTHER): Payer: Self-pay | Admitting: Urology

## 2015-07-15 DIAGNOSIS — N3289 Other specified disorders of bladder: Secondary | ICD-10-CM | POA: Diagnosis not present

## 2015-07-23 DIAGNOSIS — Z0000 Encounter for general adult medical examination without abnormal findings: Secondary | ICD-10-CM | POA: Diagnosis not present

## 2015-07-23 DIAGNOSIS — R300 Dysuria: Secondary | ICD-10-CM | POA: Diagnosis not present

## 2015-07-23 DIAGNOSIS — C679 Malignant neoplasm of bladder, unspecified: Secondary | ICD-10-CM | POA: Diagnosis not present

## 2015-07-23 DIAGNOSIS — N390 Urinary tract infection, site not specified: Secondary | ICD-10-CM | POA: Diagnosis not present

## 2015-08-15 DIAGNOSIS — R300 Dysuria: Secondary | ICD-10-CM | POA: Diagnosis not present

## 2015-10-17 DIAGNOSIS — C670 Malignant neoplasm of trigone of bladder: Secondary | ICD-10-CM | POA: Diagnosis not present

## 2015-10-20 ENCOUNTER — Other Ambulatory Visit: Payer: Self-pay | Admitting: Internal Medicine

## 2015-10-20 DIAGNOSIS — I10 Essential (primary) hypertension: Secondary | ICD-10-CM

## 2015-11-30 ENCOUNTER — Encounter (HOSPITAL_COMMUNITY): Payer: Self-pay

## 2015-11-30 ENCOUNTER — Emergency Department (HOSPITAL_COMMUNITY): Payer: PPO

## 2015-11-30 ENCOUNTER — Emergency Department (HOSPITAL_COMMUNITY)
Admission: EM | Admit: 2015-11-30 | Discharge: 2015-12-01 | Disposition: A | Discharge status: Home / Self Care | Payer: PPO | Attending: Emergency Medicine | Admitting: Emergency Medicine

## 2015-11-30 DIAGNOSIS — M533 Sacrococcygeal disorders, not elsewhere classified: Secondary | ICD-10-CM | POA: Insufficient documentation

## 2015-11-30 DIAGNOSIS — M25552 Pain in left hip: Secondary | ICD-10-CM | POA: Diagnosis not present

## 2015-11-30 DIAGNOSIS — M5432 Sciatica, left side: Secondary | ICD-10-CM

## 2015-11-30 DIAGNOSIS — M545 Low back pain: Secondary | ICD-10-CM | POA: Diagnosis not present

## 2015-11-30 DIAGNOSIS — Z7982 Long term (current) use of aspirin: Secondary | ICD-10-CM | POA: Diagnosis not present

## 2015-11-30 DIAGNOSIS — I10 Essential (primary) hypertension: Secondary | ICD-10-CM | POA: Insufficient documentation

## 2015-11-30 MED ORDER — DEXAMETHASONE SODIUM PHOSPHATE 10 MG/ML IJ SOLN
10.0000 mg | Freq: Once | INTRAMUSCULAR | Status: AC
  Administered 2015-11-30: 10 mg via INTRAMUSCULAR
  Filled 2015-11-30: qty 1

## 2015-11-30 MED ORDER — OXYCODONE-ACETAMINOPHEN 5-325 MG PO TABS
1.0000 | ORAL_TABLET | Freq: Once | ORAL | Status: AC
Start: 2015-11-30 — End: 2015-11-30
  Administered 2015-11-30: 1 via ORAL
  Filled 2015-11-30: qty 1

## 2015-11-30 MED ORDER — METHYLPREDNISOLONE 4 MG PO TBPK: ORAL_TABLET | ORAL | 0 refills | Status: DC

## 2015-11-30 MED ORDER — TRAMADOL HCL 50 MG PO TABS: 50.0000 mg | ORAL_TABLET | Freq: Four times a day (QID) | ORAL | 0 refills | Status: DC | PRN

## 2015-11-30 NOTE — ED Notes (Signed)
Updated Pt and family on wait. Pt did not need anything at the moment. Vitals reassessed.

## 2015-11-30 NOTE — ED Triage Notes (Signed)
Patient complains of back and left hip pain x 2 days. States that the pain is worse with any movement. Denies trauma.

## 2015-11-30 NOTE — ED Provider Notes (Signed)
[facility] DEPT Provider Note   CSN: [id number] Arrival date & time: 11/30/15  1930  By signing my name below, I, Catherine Snyder, attest that this documentation has been prepared under the direction and in the presence of Catherine Greek, MD. Electronically Signed: Reola Snyder, ED Scribe. 11/30/15. 11:14 PM.   History   Chief Complaint Chief Complaint  Patient presents with  . Back Pain  . Hip Pain   The history is provided by the patient and a relative. No language interpreter was used.   HPI Comments: Catherine Snyder is a 75 y.o. female with a PMHx of HTN, who presents to the Emergency Department complaining of gradual onset left-sided lower back/hip pain onset ~2 days ago. Her pain does not radiate and she notes that it is constant. Denies any known trauma. Her pain is exacerbated with any movement. Pt has been taking Ibuprofen at home with minimal relief of her pain. Denies numbness, weakness, dysuria, urgency, frequency, hematuria, or any other associated symptoms.   Past Medical History:  Diagnosis Date  . Bladder tumor   . Dysuria   . Frequency of urination   . Hematuria   . Hypertension   . Nocturia   . Wears glasses   . Wears partial dentures    upper   Patient Active Problem List   Diagnosis Date Noted  . Hypertension 11/27/2014   Past Surgical History:  Procedure Laterality Date  . CYSTOSCOPY W/ RETROGRADES Bilateral 07/07/2015   Procedure: CYSTOSCOPY WITH RETROGRADE PYELOGRAM;  Surgeon: Cleon Gustin, MD;  Location: [facility];  Service: Urology;  Laterality: Bilateral;  . NO PAST SURGERIES    . TRANSURETHRAL RESECTION OF BLADDER TUMOR WITH GYRUS (TURBT-GYRUS) N/A 07/07/2015   Procedure: TRANSURETHRAL RESECTION OF BLADDER TUMOR WITH GYRUS (TURBT-GYRUS);  Surgeon: Cleon Gustin, MD;  Location: [facility];  Service: Urology;  Laterality: N/A;    OB History    No data available       Home  Medications    Prior to Admission medications   Medication Sig Start Date End Date Taking? Authorizing Provider  amLODipine (NORVASC) 10 MG tablet take 1 tablet by mouth once daily 10/21/15   Gildardo Cranker, DO  aspirin 81 MG chewable tablet Chew 81 mg by mouth daily.    Historical Provider, MD  cholecalciferol (VITAMIN D) 1000 UNITS tablet Take 1,000 Units by mouth daily.    Historical Provider, MD  HYDROcodone-acetaminophen (NORCO) 5-325 MG tablet Take 1 tablet by mouth every 6 (six) hours as needed for moderate pain. 07/07/15   Cleon Gustin, MD  ibuprofen (ADVIL,MOTRIN) 200 MG tablet Take 200 mg by mouth as needed.    Historical Provider, MD  methylPREDNISolone (MEDROL DOSEPAK) 4 MG TBPK tablet As directed 11/30/15   Catherine Greek, MD  Multiple Vitamins-Minerals (ONE-A-DAY WOMENS 50+ ADVANTAGE) TABS Take 1 tablet by mouth daily.    Historical Provider, MD  traMADol (ULTRAM) 50 MG tablet Take 1 tablet (50 mg total) by mouth every 6 (six) hours as needed. 11/30/15   Catherine Greek, MD    Family History Family History  Problem Relation Age of Onset  . Heart disease Father     Social History Social History  Substance Use Topics  . Smoking status: Never Smoker  . Smokeless tobacco: Never Used  . Alcohol use No     Allergies   Review of patient's allergies indicates no known allergies.   Review of Systems Review  of Systems  Genitourinary: Negative for difficulty urinating, dysuria, frequency, hematuria and urgency.  Musculoskeletal: Positive for arthralgias (left hip) and back pain (lower).  Neurological: Negative for weakness and numbness.  All other systems reviewed and are negative.  Physical Exam Updated Vital Signs BP 146/80   Pulse (!) 56   Temp 98.3 F (36.8 C) (Oral)   Resp 12   SpO2 100%   Physical Exam  Constitutional: She is oriented to person, place, and time. She appears well-developed and well-nourished. No distress.  HENT:  Head:  Normocephalic and atraumatic.  Right Ear: Hearing normal.  Left Ear: Hearing normal.  Nose: Nose normal.  Mouth/Throat: Oropharynx is clear and moist and mucous membranes are normal.  Eyes: Conjunctivae and EOM are normal. Pupils are equal, round, and reactive to light.  Neck: Normal range of motion. Neck supple.  Cardiovascular: Regular rhythm, S1 normal and S2 normal.  Exam reveals no gallop and no friction rub.   No murmur heard. Pulmonary/Chest: Effort normal and breath sounds normal. No respiratory distress. She exhibits no tenderness.  Abdominal: Soft. Normal appearance and bowel sounds are normal. There is no hepatosplenomegaly. There is no tenderness. There is no rebound, no guarding, no tenderness at McBurney's point and negative Murphy's sign. No hernia.  Musculoskeletal: Normal range of motion.  Mild positive straight leg raise at 45 degrees on the left. Strength, reflex, and sensation intact. Point tenderness in the SI joint in the left. DP pulses are intact.   Neurological: She is alert and oriented to person, place, and time. She has normal strength and normal reflexes. She displays normal reflexes. No cranial nerve deficit or sensory deficit. Coordination normal. GCS eye subscore is 4. GCS verbal subscore is 5. GCS motor subscore is 6.  Reflex Scores:      Patellar reflexes are 2+ on the right side and 2+ on the left side. Skin: Skin is warm, dry and intact. No rash noted. No cyanosis.  Psychiatric: She has a normal mood and affect. Her speech is normal and behavior is normal. Thought content normal.  Nursing note and vitals reviewed.  ED Treatments / Results  DIAGNOSTIC STUDIES: Oxygen Saturation is 100% on RA, normal by my interpretation.   COORDINATION OF CARE: 11:10 PM-Discussed next steps with pt. Pt verbalized understanding and is agreeable with the plan.   Labs (all labs ordered are listed, but only abnormal results are displayed) Labs Reviewed - No data to  display  Radiology Dg Hip Unilat W Or Wo Pelvis 2-3 Views Left  Result Date: 11/30/2015 CLINICAL DATA:  Left hip pain 3 days with limited range of motion. No injury. EXAM: DG HIP (WITH OR WITHOUT PELVIS) 2-3V LEFT COMPARISON:  None. FINDINGS: There mild symmetric degenerative changes of the hips. No evidence of acute fracture or dislocation. Degenerative change of the spine and sacroiliac joints. IMPRESSION: No acute findings. Mild degenerative change of the hips. Electronically Signed   By: Marin Olp M.D.   On: 11/30/2015 21:29    Procedures Procedures (including critical care time)  Medications Ordered in ED Medications  oxyCODONE-acetaminophen (PERCOCET/ROXICET) 5-325 MG per tablet 1 tablet (not administered)  dexamethasone (DECADRON) injection 10 mg (10 mg Intramuscular Given 11/30/15 2318)     Initial Impression / Assessment and Plan / ED Course  I have reviewed the triage vital signs and the nursing notes.  Pertinent labs & imaging results that were available during my care of the patient were reviewed by me and considered in my medical  decision making (see chart for details).  Clinical Course   Patient presents with complaints of pain in the left hip area. Patient having pain that starts in the SI joint region and radiates across the hip. Pain is significantly worsened with flexion at the hip. She has normal strength, sensation, reflexes. No foot drop. No bowel or bladder changes. The significant reproducibility of the pain with movement is consistent with musculoskeletal pain. As she does not have any neurologic deficits, to be treated empirically and conservatively. She will contact her primary doctor in the morning for a recheck to see if she is improving with steroids. Will also provide analgesia.  Final Clinical Impressions(s) / ED Diagnoses   Final diagnoses:  Sciatica of left side    New Prescriptions New Prescriptions   METHYLPREDNISOLONE (MEDROL DOSEPAK) 4 MG  TBPK TABLET    As directed   TRAMADOL (ULTRAM) 50 MG TABLET    Take 1 tablet (50 mg total) by mouth every 6 (six) hours as needed.     Catherine Greek, MD 11/30/15 [phone]

## 2015-12-11 LAB — HM MAMMOGRAPHY

## 2016-02-02 ENCOUNTER — Encounter: Payer: Self-pay | Admitting: Nurse Practitioner

## 2016-02-02 ENCOUNTER — Ambulatory Visit (INDEPENDENT_AMBULATORY_CARE_PROVIDER_SITE_OTHER): Payer: PPO | Admitting: Nurse Practitioner

## 2016-02-02 VITALS — BP 126/82 | HR 85 | Temp 97.6°F | Resp 18 | Ht 63.0 in | Wt 170.6 lb

## 2016-02-02 DIAGNOSIS — I10 Essential (primary) hypertension: Secondary | ICD-10-CM

## 2016-02-02 DIAGNOSIS — R9431 Abnormal electrocardiogram [ECG] [EKG]: Secondary | ICD-10-CM

## 2016-02-02 DIAGNOSIS — R0609 Other forms of dyspnea: Secondary | ICD-10-CM

## 2016-02-02 LAB — CBC WITH DIFFERENTIAL/PLATELET
Basophils Absolute: 0 cells/uL (ref 0–200)
Basophils Relative: 0 %
EOS PCT: 1 %
Eosinophils Absolute: 54 cells/uL (ref 15–500)
HCT: 34.8 % — ABNORMAL LOW (ref 35.0–45.0)
HEMOGLOBIN: 10.7 g/dL — AB (ref 11.7–15.5)
LYMPHS ABS: 1836 {cells}/uL (ref 850–3900)
LYMPHS PCT: 34 %
MCH: 22.7 pg — AB (ref 27.0–33.0)
MCHC: 30.7 g/dL — AB (ref 32.0–36.0)
MCV: 73.7 fL — ABNORMAL LOW (ref 80.0–100.0)
MONOS PCT: 12 %
MPV: 9.4 fL (ref 7.5–12.5)
Monocytes Absolute: 648 cells/uL (ref 200–950)
NEUTROS PCT: 53 %
Neutro Abs: 2862 cells/uL (ref 1500–7800)
PLATELETS: 277 10*3/uL (ref 140–400)
RBC: 4.72 MIL/uL (ref 3.80–5.10)
RDW: 20.3 % — ABNORMAL HIGH (ref 11.0–15.0)
WBC: 5.4 10*3/uL (ref 3.8–10.8)

## 2016-02-02 LAB — BASIC METABOLIC PANEL WITH GFR
BUN: 13 mg/dL (ref 7–25)
CALCIUM: 9.4 mg/dL (ref 8.6–10.4)
CO2: 25 mmol/L (ref 20–31)
CREATININE: 0.76 mg/dL (ref 0.60–0.93)
Chloride: 107 mmol/L (ref 98–110)
GFR, EST AFRICAN AMERICAN: 89 mL/min (ref >=60)
GFR, EST NON AFRICAN AMERICAN: 78 mL/min (ref >=60)
Glucose, Bld: 116 mg/dL — ABNORMAL HIGH (ref 65–99)
Potassium: 4.1 mmol/L (ref 3.5–5.3)
SODIUM: 140 mmol/L (ref 135–146)

## 2016-02-02 MED ORDER — FUROSEMIDE 20 MG PO TABS: 20.0000 mg | ORAL_TABLET | Freq: Every day | ORAL | 1 refills | Status: DC

## 2016-02-02 NOTE — Patient Instructions (Addendum)
Chest x-ray ordered --- to complete at [facility]. Labs drawn --- will contact with results Begin Lasix 20 mg daily   If symptoms worsen or chest pains occur go to hospital immediately

## 2016-02-02 NOTE — Progress Notes (Signed)
Careteam: Patient Care Team: Gildardo Cranker, DO as PCP - General (Internal Medicine)  Advanced Directive information Does patient have an advance directive?: No  No Known Allergies  Chief Complaint  Patient presents with  . Acute Visit    SOB x 2-3 days with any activity. Pt had a cold/chest congestion 1 month ago.   . Other    Does not want flu vaccine     HPI: Patient is a 75 y.o. female seen in the office today with complaints of shortness of breath. The patients daughter and granddaughter have accompanied her to this appointment. The patient reports she gets extremely short of breath with any activity and that it started within the past week. When she is short of breath she also has a nonproductive cough. She is unable to walk up the 6 stairs they have in their home or from the living room to the kitchen, which is approximately 10-15 feet. The patient denies pain with the shortness of breath. Shortness of breath has never occurred when at rest. No history of COPD or asthma; the patient is a nonsmoker. The daughter and granddaughter do smoke, but not in the house. When the shortness of breath occurs the patient sits or lies down and rests and it goes away.  She does report feeling like her heart skips a beat or flutters sometimes. Keeps feet elevated to prevent swelling. Notices increase in edema in the evening, improves in am.  Lots of cramps in legs.  Sleeps in recliner for comfort, denies shortness of breath when lying down.  Does not eat foods high in sodium. Denies any chest pain. Denies dark, tarry stools or bruising. Admits that she sits quite a bit since this has started.  Review of Systems:  Review of Systems  Constitutional: Negative for appetite change, chills, fever and unexpected weight change.  HENT: Positive for congestion. Negative for rhinorrhea, sneezing and sore throat.   Respiratory: Positive for cough, chest tightness (when coughing and feeling SOB), shortness  of breath and wheezing (a little). Negative for choking.   Cardiovascular: Positive for palpitations and leg swelling. Negative for chest pain.  Gastrointestinal: Negative for abdominal pain and blood in stool.  Neurological: Negative for dizziness, weakness and light-headedness.  Hematological: Does not bruise/bleed easily.  Psychiatric/Behavioral: Negative for sleep disturbance.    Past Medical History:  Diagnosis Date  . Bladder tumor   . Dysuria   . Frequency of urination   . Hematuria   . Hypertension   . Nocturia   . Wears glasses   . Wears partial dentures    upper   Past Surgical History:  Procedure Laterality Date  . CYSTOSCOPY W/ RETROGRADES Bilateral 07/07/2015   Procedure: CYSTOSCOPY WITH RETROGRADE PYELOGRAM;  Surgeon: Cleon Gustin, MD;  Location: [facility];  Service: Urology;  Laterality: Bilateral;  . NO PAST SURGERIES    . TRANSURETHRAL RESECTION OF BLADDER TUMOR WITH GYRUS (TURBT-GYRUS) N/A 07/07/2015   Procedure: TRANSURETHRAL RESECTION OF BLADDER TUMOR WITH GYRUS (TURBT-GYRUS);  Surgeon: Cleon Gustin, MD;  Location: [facility];  Service: Urology;  Laterality: N/A;   Social History:   reports that she has never smoked. She has never used smokeless tobacco. She reports that she does not drink alcohol or use drugs.  Family History  Problem Relation Age of Onset  . Heart disease Father     Medications: Patient's Medications  New Prescriptions   No medications on file  Previous Medications  AMLODIPINE (NORVASC) 10 MG TABLET    take 1 tablet by mouth once daily   ASPIRIN 81 MG CHEWABLE TABLET    Chew 81 mg by mouth daily.  Modified Medications   No medications on file  Discontinued Medications   CHOLECALCIFEROL (VITAMIN D) 1000 UNITS TABLET    Take 1,000 Units by mouth daily.   HYDROCODONE-ACETAMINOPHEN (NORCO) 5-325 MG TABLET    Take 1 tablet by mouth every 6 (six) hours as needed for moderate pain.   IBUPROFEN  (ADVIL,MOTRIN) 200 MG TABLET    Take 200 mg by mouth as needed.   METHYLPREDNISOLONE (MEDROL DOSEPAK) 4 MG TBPK TABLET    As directed   MULTIPLE VITAMINS-MINERALS (ONE-A-DAY WOMENS 50+ ADVANTAGE) TABS    Take 1 tablet by mouth daily.   TRAMADOL (ULTRAM) 50 MG TABLET    Take 1 tablet (50 mg total) by mouth every 6 (six) hours as needed.     Physical Exam:  Vitals:   02/02/16 1506  BP: 126/82  Pulse: 85  Resp: 18  Temp: 97.6 F (36.4 C)  TempSrc: Oral  SpO2: 95%  Weight: 170 lb 9.6 oz (77.4 kg)  Height: '5\' 3"'  (1.6 m)   Body mass index is 30.22 kg/m.  Physical Exam  Constitutional: She is oriented to person, place, and time. She appears well-developed and well-nourished.  HENT:  Head: Normocephalic.  Mouth/Throat: Oropharynx is clear and moist.  Eyes: Pupils are equal, round, and reactive to light.  Cardiovascular: Normal rate, regular rhythm and normal heart sounds.   Pulmonary/Chest: Breath sounds normal.  Shortness of breath upon exertion and with talking.  Abdominal: Soft. Bowel sounds are normal.  Neurological: She is alert and oriented to person, place, and time.  Skin: Skin is warm and dry.  Psychiatric: She has a normal mood and affect. Her behavior is normal. Judgment and thought content normal.    Labs reviewed: Basic Metabolic Panel:  Recent Labs  04/04/15 1242 07/07/15 1333  NA 142 143  K 3.8 3.4*  CL 101 107  CO2 24  --   GLUCOSE 106* 96  BUN 12 13  CREATININE 0.67 0.60  CALCIUM 9.3  --    Liver Function Tests: No results for input(s): AST, ALT, ALKPHOS, BILITOT, PROT, ALBUMIN in the last 8760 hours. No results for input(s): LIPASE, AMYLASE in the last 8760 hours. No results for input(s): AMMONIA in the last 8760 hours. CBC:  Recent Labs  04/04/15 1242 07/07/15 1333  WBC 4.1  --   NEUTROABS 2.2  --   HGB  --  10.5*  HCT 37.7 31.0*  MCV 88  --   PLT 306  --    Lipid Panel: No results for input(s): CHOL, HDL, LDLCALC, TRIG, CHOLHDL,  LDLDIRECT in the last 8760 hours. TSH: No results for input(s): TSH in the last 8760 hours. A1C: No results found for: HGBA1C   Assessment/Plan 1. Dyspnea on exertion - No increase in lower extremity swelling. No chest pain.  - O2 sat dropped from 94% to 91% while walking down the hall. Upon sitting the O2 sat came back up to 94% - Hgb has decreased from 12.8 on 04/04/15 to 10.5 on 07/07/15, f/u CBC today - EKG: T wave inversion in V2 and V3 that are new since 05/07/15 - Cardiology referral place and pt has appt tomorrow at 3 pm - DG Chest 2 View - BMP with eGFR to re-check potassium level  - Started Lasix 20 mg PO daily - No  recent lipids   2. Essential hypertension - Controlled. BP 126/82 today. - Continue taking ASA 81 mg and Norvasc 10 mg  3. EKG abnormalities - T wave inversion in V2 and V3 that are new since 05/07/15 - Ambulatory referral to Cardiology - ASA 81 mg daily  Pt, daughter, and granddaughter education extensively that is shortness of breath worsens, chest pains occur or pt has any decline to go to the ED immediately. All express understanding.   Carlos American. Harle Battiest  [facility] [phone] 8 am - 5 pm) [phone] (after hours)

## 2016-02-03 ENCOUNTER — Inpatient Hospital Stay (HOSPITAL_COMMUNITY)
Admission: EM | Admit: 2016-02-03 | Discharge: 2016-02-11 | DRG: 286 | Disposition: A | Discharge status: Home / Self Care | Payer: PPO | Attending: Internal Medicine | Admitting: Internal Medicine

## 2016-02-03 ENCOUNTER — Other Ambulatory Visit: Payer: Self-pay | Admitting: Student

## 2016-02-03 ENCOUNTER — Ambulatory Visit (INDEPENDENT_AMBULATORY_CARE_PROVIDER_SITE_OTHER): Payer: PPO | Admitting: Student

## 2016-02-03 ENCOUNTER — Encounter (HOSPITAL_COMMUNITY): Payer: Self-pay

## 2016-02-03 ENCOUNTER — Encounter: Payer: Self-pay | Admitting: Student

## 2016-02-03 ENCOUNTER — Telehealth: Payer: Self-pay | Admitting: Physician Assistant

## 2016-02-03 ENCOUNTER — Encounter: Payer: Self-pay | Admitting: Physician Assistant

## 2016-02-03 ENCOUNTER — Other Ambulatory Visit: Payer: Self-pay

## 2016-02-03 ENCOUNTER — Inpatient Hospital Stay (HOSPITAL_COMMUNITY): Payer: PPO

## 2016-02-03 VITALS — BP 126/70 | HR 78 | Ht 63.0 in | Wt 171.0 lb

## 2016-02-03 DIAGNOSIS — I255 Ischemic cardiomyopathy: Secondary | ICD-10-CM | POA: Diagnosis not present

## 2016-02-03 DIAGNOSIS — R0609 Other forms of dyspnea: Secondary | ICD-10-CM | POA: Diagnosis not present

## 2016-02-03 DIAGNOSIS — R7989 Other specified abnormal findings of blood chemistry: Secondary | ICD-10-CM | POA: Diagnosis not present

## 2016-02-03 DIAGNOSIS — I2602 Saddle embolus of pulmonary artery with acute cor pulmonale: Secondary | ICD-10-CM

## 2016-02-03 DIAGNOSIS — I25118 Atherosclerotic heart disease of native coronary artery with other forms of angina pectoris: Secondary | ICD-10-CM | POA: Diagnosis present

## 2016-02-03 DIAGNOSIS — Z7982 Long term (current) use of aspirin: Secondary | ICD-10-CM | POA: Diagnosis not present

## 2016-02-03 DIAGNOSIS — Z8249 Family history of ischemic heart disease and other diseases of the circulatory system: Secondary | ICD-10-CM | POA: Diagnosis not present

## 2016-02-03 DIAGNOSIS — E876 Hypokalemia: Secondary | ICD-10-CM | POA: Diagnosis not present

## 2016-02-03 DIAGNOSIS — I2729 Other secondary pulmonary hypertension: Secondary | ICD-10-CM | POA: Diagnosis not present

## 2016-02-03 DIAGNOSIS — Z8551 Personal history of malignant neoplasm of bladder: Secondary | ICD-10-CM | POA: Diagnosis not present

## 2016-02-03 DIAGNOSIS — R748 Abnormal levels of other serum enzymes: Secondary | ICD-10-CM | POA: Diagnosis not present

## 2016-02-03 DIAGNOSIS — D509 Iron deficiency anemia, unspecified: Secondary | ICD-10-CM | POA: Diagnosis not present

## 2016-02-03 DIAGNOSIS — I214 Non-ST elevation (NSTEMI) myocardial infarction: Secondary | ICD-10-CM | POA: Diagnosis not present

## 2016-02-03 DIAGNOSIS — I2489 Other forms of acute ischemic heart disease: Secondary | ICD-10-CM

## 2016-02-03 DIAGNOSIS — I97618 Postprocedural hemorrhage and hematoma of a circulatory system organ or structure following other circulatory system procedure: Secondary | ICD-10-CM | POA: Diagnosis not present

## 2016-02-03 DIAGNOSIS — J9601 Acute respiratory failure with hypoxia: Secondary | ICD-10-CM | POA: Diagnosis present

## 2016-02-03 DIAGNOSIS — I2699 Other pulmonary embolism without acute cor pulmonale: Secondary | ICD-10-CM | POA: Clinically undetermined

## 2016-02-03 DIAGNOSIS — I2692 Saddle embolus of pulmonary artery without acute cor pulmonale: Secondary | ICD-10-CM

## 2016-02-03 DIAGNOSIS — Z79899 Other long term (current) drug therapy: Secondary | ICD-10-CM

## 2016-02-03 DIAGNOSIS — I2609 Other pulmonary embolism with acute cor pulmonale: Secondary | ICD-10-CM | POA: Diagnosis not present

## 2016-02-03 DIAGNOSIS — I2510 Atherosclerotic heart disease of native coronary artery without angina pectoris: Secondary | ICD-10-CM | POA: Diagnosis not present

## 2016-02-03 DIAGNOSIS — I248 Other forms of acute ischemic heart disease: Secondary | ICD-10-CM | POA: Diagnosis not present

## 2016-02-03 DIAGNOSIS — Z66 Do not resuscitate: Secondary | ICD-10-CM | POA: Diagnosis not present

## 2016-02-03 DIAGNOSIS — R0600 Dyspnea, unspecified: Secondary | ICD-10-CM

## 2016-02-03 DIAGNOSIS — R778 Other specified abnormalities of plasma proteins: Secondary | ICD-10-CM

## 2016-02-03 DIAGNOSIS — Z01818 Encounter for other preprocedural examination: Secondary | ICD-10-CM

## 2016-02-03 DIAGNOSIS — I2721 Secondary pulmonary arterial hypertension: Secondary | ICD-10-CM | POA: Diagnosis not present

## 2016-02-03 DIAGNOSIS — R0602 Shortness of breath: Secondary | ICD-10-CM | POA: Diagnosis not present

## 2016-02-03 DIAGNOSIS — I272 Pulmonary hypertension, unspecified: Secondary | ICD-10-CM

## 2016-02-03 DIAGNOSIS — I959 Hypotension, unspecified: Secondary | ICD-10-CM | POA: Diagnosis not present

## 2016-02-03 DIAGNOSIS — I10 Essential (primary) hypertension: Secondary | ICD-10-CM | POA: Diagnosis present

## 2016-02-03 DIAGNOSIS — D649 Anemia, unspecified: Secondary | ICD-10-CM

## 2016-02-03 HISTORY — DX: Other forms of acute ischemic heart disease: I24.89

## 2016-02-03 HISTORY — DX: Other forms of acute ischemic heart disease: I24.8

## 2016-02-03 LAB — BASIC METABOLIC PANEL WITH GFR
BUN: 15 mg/dL (ref 7–25)
CHLORIDE: 106 mmol/L (ref 98–110)
CO2: 23 mmol/L (ref 20–31)
Calcium: 8.9 mg/dL (ref 8.6–10.4)
Creat: 0.84 mg/dL (ref 0.60–0.93)
GFR, Est African American: 79 mL/min (ref >=60)
GFR, Est Non African American: 69 mL/min (ref >=60)
Glucose, Bld: 109 mg/dL — ABNORMAL HIGH (ref 65–99)
POTASSIUM: 3.4 mmol/L — AB (ref 3.5–5.3)
Sodium: 140 mmol/L (ref 135–146)

## 2016-02-03 LAB — CBC WITH DIFFERENTIAL/PLATELET
BASOS ABS: 0 {cells}/uL (ref 0–200)
BASOS ABS: 0 10*3/uL (ref 0.0–0.1)
Basophils Relative: 0 %
Basophils Relative: 0 %
EOS PCT: 1 %
Eosinophils Absolute: 50 cells/uL (ref 15–500)
Eosinophils Absolute: 0.1 10*3/uL (ref 0.0–0.7)
Eosinophils Relative: 1 %
HCT: 34.4 % — ABNORMAL LOW (ref 35.0–45.0)
HEMATOCRIT: 33.9 % — AB (ref 36.0–46.0)
HEMOGLOBIN: 10.7 g/dL — AB (ref 11.7–15.5)
Hemoglobin: 10.6 g/dL — ABNORMAL LOW (ref 12.0–15.0)
LYMPHS ABS: 1600 {cells}/uL (ref 850–3900)
LYMPHS PCT: 30 %
Lymphocytes Relative: 32 %
Lymphs Abs: 1.4 10*3/uL (ref 0.7–4.0)
MCH: 22 pg — ABNORMAL LOW (ref 27.0–33.0)
MCH: 22.5 pg — ABNORMAL LOW (ref 26.0–34.0)
MCHC: 31.1 g/dL — ABNORMAL LOW (ref 32.0–36.0)
MCHC: 31.3 g/dL (ref 30.0–36.0)
MCV: 70.6 fL — ABNORMAL LOW (ref 80.0–100.0)
MCV: 72 fL — AB (ref 78.0–100.0)
MPV: 9 fL (ref 7.5–12.5)
Monocytes Absolute: 450 cells/uL (ref 200–950)
Monocytes Absolute: 0.4 10*3/uL (ref 0.1–1.0)
Monocytes Relative: 9 %
Monocytes Relative: 8 %
NEUTROS ABS: 2900 {cells}/uL (ref 1500–7800)
NEUTROS ABS: 2.8 10*3/uL (ref 1.7–7.7)
Neutrophils Relative %: 58 %
Neutrophils Relative %: 61 %
Platelets: 261 10*3/uL (ref 140–400)
Platelets: 256 10*3/uL (ref 150–400)
RBC: 4.87 MIL/uL (ref 3.80–5.10)
RBC: 4.71 MIL/uL (ref 3.87–5.11)
RDW: 20.6 % — ABNORMAL HIGH (ref 11.0–15.0)
RDW: 19.9 % — ABNORMAL HIGH (ref 11.5–15.5)
WBC: 5 10*3/uL (ref 3.8–10.8)
WBC: 4.6 10*3/uL (ref 4.0–10.5)

## 2016-02-03 LAB — BASIC METABOLIC PANEL
ANION GAP: 11 (ref 5–15)
BUN: 16 mg/dL (ref 6–20)
CO2: 21 mmol/L — AB (ref 22–32)
Calcium: 9.1 mg/dL (ref 8.9–10.3)
Chloride: 107 mmol/L (ref 101–111)
Creatinine, Ser: 1 mg/dL (ref 0.44–1.00)
GFR calc Af Amer: >60 mL/min (ref >60)
GFR calc non Af Amer: 54 mL/min — ABNORMAL LOW (ref >60)
GLUCOSE: 130 mg/dL — AB (ref 65–99)
POTASSIUM: 3.2 mmol/L — AB (ref 3.5–5.1)
Sodium: 139 mmol/L (ref 135–145)

## 2016-02-03 LAB — TSH: TSH: 1.87 m[IU]/L

## 2016-02-03 LAB — BRAIN NATRIURETIC PEPTIDE: BRAIN NATRIURETIC PEPTIDE: 477.2 pg/mL — AB (ref <100)

## 2016-02-03 LAB — I-STAT TROPONIN, ED: Troponin i, poc: 0.19 ng/mL (ref 0.00–0.08)

## 2016-02-03 LAB — TROPONIN I: TROPONIN I: 0.43 ng/mL — AB (ref <=0.05)

## 2016-02-03 LAB — APTT: APTT: 25 s (ref 22–34)

## 2016-02-03 LAB — PROTIME-INR
INR: 1.2 — ABNORMAL HIGH
PROTHROMBIN TIME: 12.5 s — AB (ref 9.0–11.5)

## 2016-02-03 MED ORDER — SODIUM CHLORIDE 0.9 % IV SOLN: 250.0000 mL | INTRAVENOUS | Status: DC | PRN

## 2016-02-03 MED ORDER — ASPIRIN 81 MG PO CHEW: 324.0000 mg | CHEWABLE_TABLET | ORAL | Status: AC

## 2016-02-03 MED ORDER — SODIUM CHLORIDE 0.9% FLUSH: 3.0000 mL | INTRAVENOUS | Status: DC | PRN

## 2016-02-03 MED ORDER — SODIUM CHLORIDE 0.9% FLUSH: 3.0000 mL | Freq: Two times a day (BID) | INTRAVENOUS | Status: DC

## 2016-02-03 MED ORDER — ONDANSETRON HCL 4 MG/2ML IJ SOLN: 4.0000 mg | Freq: Four times a day (QID) | INTRAMUSCULAR | Status: DC | PRN

## 2016-02-03 MED ORDER — ASPIRIN 300 MG RE SUPP: 300.0000 mg | RECTAL | Status: AC

## 2016-02-03 MED ORDER — LISINOPRIL 2.5 MG PO TABS: 2.5000 mg | ORAL_TABLET | Freq: Every day | ORAL | 1 refills | Status: DC

## 2016-02-03 MED ORDER — SODIUM CHLORIDE 0.9 % WEIGHT BASED INFUSION: 1.0000 mL/kg/h | INTRAVENOUS | Status: DC

## 2016-02-03 MED ORDER — ASPIRIN EC 81 MG PO TBEC: 81.0000 mg | DELAYED_RELEASE_TABLET | Freq: Every day | ORAL | Status: DC

## 2016-02-03 MED ORDER — SODIUM CHLORIDE 0.9 % WEIGHT BASED INFUSION
3.0000 mL/kg/h | INTRAVENOUS | Status: DC
  Administered 2016-02-04: 3 mL/kg/h via INTRAVENOUS

## 2016-02-03 MED ORDER — HEPARIN BOLUS VIA INFUSION
4000.0000 [IU] | Freq: Once | INTRAVENOUS | Status: AC
  Administered 2016-02-03: 4000 [IU] via INTRAVENOUS
  Filled 2016-02-03: qty 4000

## 2016-02-03 MED ORDER — NITROGLYCERIN 0.4 MG SL SUBL: 0.4000 mg | SUBLINGUAL_TABLET | SUBLINGUAL | Status: DC | PRN

## 2016-02-03 MED ORDER — HEPARIN (PORCINE) IN NACL 100-0.45 UNIT/ML-% IJ SOLN
850.0000 [IU]/h | INTRAMUSCULAR | Status: DC
  Administered 2016-02-03: 850 [IU]/h via INTRAVENOUS
  Filled 2016-02-03: qty 250

## 2016-02-03 MED ORDER — ATORVASTATIN CALCIUM 80 MG PO TABS
80.0000 mg | ORAL_TABLET | Freq: Every day | ORAL | Status: DC
  Administered 2016-02-03 – 2016-02-10 (×8): 80 mg via ORAL
  Filled 2016-02-03 (×9): qty 1

## 2016-02-03 MED ORDER — ACETAMINOPHEN 325 MG PO TABS
650.0000 mg | ORAL_TABLET | ORAL | Status: DC | PRN
  Administered 2016-02-04: 650 mg via ORAL
  Filled 2016-02-03 (×2): qty 2

## 2016-02-03 MED ORDER — ASPIRIN 81 MG PO CHEW
81.0000 mg | CHEWABLE_TABLET | ORAL | Status: AC
  Administered 2016-02-04: 81 mg via ORAL
  Filled 2016-02-03: qty 1

## 2016-02-03 NOTE — ED Triage Notes (Signed)
Pt states she had SOB x 1 week; pt states she was seen yesterday and earlier today; pt states she was released but was called at home to come back because something was off in her blood work; pt states she was told to come back to be admitted; pt denies pain on arrival. Pt a&ox 4 on arrival; no acute distress noted a triage;

## 2016-02-03 NOTE — Progress Notes (Signed)
ANTICOAGULATION CONSULT NOTE - Initial Consult  Pharmacy Consult for Heparin  Indication: chest pain/ACS  No Known Allergies  Patient Measurements: Height: 5\' 3"  (160 cm) Weight: 166 lb 12.8 oz (75.7 kg) IBW/kg (Calculated) : 52.4  Vital Signs: Temp: 98.1 F (36.7 C) (11/21 2259) Temp Source: Oral (11/21 2259) BP: 123/93 (11/21 2218) Pulse Rate: 72 (11/21 2218)  Labs:  Recent Labs  02/02/16 1607 02/03/16 1619 02/03/16 2046  HGB 10.7* 10.7* 10.6*  HCT 34.8* 34.4* 33.9*  PLT 277 261 256  APTT  --  25  --   LABPROT  --  12.5*  --   INR  --  1.2*  --   CREATININE 0.76 0.84 1.00  TROPONINI  --  0.43*  --    Estimated Creatinine Clearance: 48.1 mL/min (by C-G formula based on SCr of 1 mg/dL).  Medical History: Past Medical History:  Diagnosis Date  . Bladder cancer ([place])    a. s/p resection in 06/2015  . Dysuria   . Frequency of urination   . Hematuria   . Hypertension   . Nocturia   . Wears glasses   . Wears partial dentures    upper   Assessment: 75 y/o F to start heparin for NSTEMI. Hgb 10.6, renal function good, other labs reviewed. PTA meds reviewed.   Goal of Therapy:  Heparin level 0.3-0.7 units/ml Monitor platelets by anticoagulation protocol: Yes   Plan:  -Heparin 4000 units BOLUS -Start heparin drip at 850 units/hr -0800 HL -Daily CBC/HL -Monitor for bleeding  Narda Bonds 02/03/2016,11:31 PM

## 2016-02-03 NOTE — H&P (Signed)
History & Physical    Patient ID: Catherine Snyder MRN: [id number], DOB/AGE: Mar 09, 1941   Admit date: 02/03/2016   Primary Physician: Gildardo Cranker, DO Primary Cardiologist: Claiborne Billings  Patient Profile    40 y o woman with NSTEMI  Past Medical History    Past Medical History:  Diagnosis Date  . Bladder cancer ([place])    a. s/p resection in 06/2015  . Dysuria   . Frequency of urination   . Hematuria   . Hypertension   . Nocturia   . Wears glasses   . Wears partial dentures    upper    Past Surgical History:  Procedure Laterality Date  . CYSTOSCOPY W/ RETROGRADES Bilateral 07/07/2015   Procedure: CYSTOSCOPY WITH RETROGRADE PYELOGRAM;  Surgeon: Cleon Gustin, MD;  Location: [facility];  Service: Urology;  Laterality: Bilateral;  . NO PAST SURGERIES    . TRANSURETHRAL RESECTION OF BLADDER TUMOR WITH GYRUS (TURBT-GYRUS) N/A 07/07/2015   Procedure: TRANSURETHRAL RESECTION OF BLADDER TUMOR WITH GYRUS (TURBT-GYRUS);  Surgeon: Cleon Gustin, MD;  Location: [facility];  Service: Urology;  Laterality: N/A;     Allergies  No Known Allergies  History of Present Illness    Catherine Snyder is a 75 y.o. female with past medical history of HTN, bladder CA (s/p resection in 06/2015), and family history of CAD who presents to ED as direct admit for NSTEMI. She was seen in cardiology clinic earlier today for evaluation of DOE suspected to be her anginal equivalent.   She reports having worsening dyspnea with exertion for the past week. She is usually able to perform activities without any symptoms. Has been raking leaves within the past month and also mopping floors. Starting last Wednesday, she was unable to perform these activities. She now becomes short of breath with walking less than 5 feet in her house or climbing 6 stairs. She denies any associated chest discomfort, palpitations, lightheadedness, or dizziness. No associated orthopnea, PND, or  lower extremity edema. Her daughter who is with her today is very concerned at how last her mother's condition has deteriorated.   EKG was reported as showing new TWI in V2 and V3 which were new from 04/2015, granted this has not yet been scanned into the computer for review. She was started on PO Lasix 20mg  daily and referred for outpatient Cardiology follow-up.   She denies any prior cardiac history. No known MIs or cardiac arrhythmias. No history of hyperlipidemia or Type II DM. No prior tobacco use. Does have an occasional glass of red wine. No recreational drug use. She does have a significant family history of coronary disease, with her brother having a fatal MI in his 75's, father having an MI in his 11's, and her mother had a fatal MI at age 32.  In cardiology clinic a trop was drawn and came back 0.4. She was sent to ED for admit with plan for LHC in am.  Home Medications    Prior to Admission medications   Medication Sig Start Date End Date Taking? Authorizing Provider  amLODipine (NORVASC) 10 MG tablet take 1 tablet by mouth once daily 10/21/15   Gildardo Cranker, DO  aspirin 81 MG chewable tablet Chew 81 mg by mouth daily.    Historical Provider, MD  furosemide (LASIX) 20 MG tablet Take 1 tablet (20 mg total) by mouth daily. 02/02/16   Lauree Chandler, NP  lisinopril (PRINIVIL,ZESTRIL) 2.5 MG tablet Take 1 tablet (2.5 mg  total) by mouth daily. 02/03/16 05/03/16  Erma Heritage, PA    Family History    Family History  Problem Relation Age of Onset  . CAD Mother 65  . Heart disease Father 27  . CAD Brother 67    Social History    Social History   Social History  . Marital status: Single    Spouse name: N/A  . Number of children: N/A  . Years of education: N/A   Occupational History  . Retired Automotive engineer    Social History Main Topics  . Smoking status: Never Smoker  . Smokeless tobacco: Never Used  . Alcohol use No  . Drug use: No  . Sexual activity: No   Other  Topics Concern  . Not on file   Social History Narrative    Diet:   Do you drink/eat things with caffeine? Yes coffee   Marital status: Divorced                              What year were you married?    Do you live in a house, apartment, assisted living, condo, trailer, etc)? House   Is it one or more stories? 1 story   How many persons live in your home? 4   Do you have any pets in your home? 0   Current or past profession: Dietitian   Do you exercise? Yes                                       Type & how often:  Walk daily   Do you have a living will? No   Do you have a DNR Form? No   Do you have a POA/HPOA forms? No     Review of Systems    General:  No chills, fever, night sweats or weight changes.  Cardiovascular:  No chest pain, dyspnea on exertion, edema, orthopnea, palpitations, paroxysmal nocturnal dyspnea. Dermatological: No rash, lesions/masses Respiratory: No cough, dyspnea Urologic: No hematuria, dysuria Abdominal:   No nausea, vomiting, diarrhea, bright red blood per rectum, melena, or hematemesis Neurologic:  No visual changes, wkns, changes in mental status. All other systems reviewed and are otherwise negative except as noted above.  Physical Exam    Blood pressure 115/77, pulse 84, temperature 97.7 F (36.5 C), temperature source Oral, resp. rate 19, height 5\' 3"  (1.6 m), weight 77.1 kg (170 lb), SpO2 100 %.  General: Pleasant, NAD Psych: Normal affect. Neuro: Alert and oriented X 3. Moves all extremities spontaneously. HEENT: Normal  Neck: Supple without bruits or JVD. Lungs:  Resp regular and unlabored, CTA. Heart: RRR no s3, s4, or murmurs. Abdomen: Soft, non-tender, non-distended, BS + x 4.  Extremities: No clubbing, cyanosis or edema. DP/PT/Radials 2+ and equal bilaterally.  Labs    Troponin (Point of Care Test) No results for input(s): TROPIPOC in the last 72 hours.  Recent Labs  02/03/16 1619  TROPONINI 0.43*   Lab Results  Component  Value Date   WBC 5.0 02/03/2016   HGB 10.7 (L) 02/03/2016   HCT 34.4 (L) 02/03/2016   MCV 70.6 (L) 02/03/2016   PLT 261 02/03/2016    Recent Labs Lab 02/03/16 1619  NA 140  K 3.4*  CL 106  CO2 23  BUN 15  CREATININE 0.84  CALCIUM 8.9  GLUCOSE 109*  Lab Results  Component Value Date   CHOL 182 11/27/2014   HDL 43 11/27/2014   LDLCALC 113 (H) 11/27/2014   TRIG 128 11/27/2014   No results found for: [facility]   Radiology Studies    No results found.  ECG & Cardiac Imaging    NSR wit T wave inversions in II/III also in V3. No q waves  Assessment & Plan    89 y o woman with NSTEMI. No other sign comorbidities besides controlled HTN.  Plan: - ASA full dose - Hep drip - LHC in am - TTE ordered - Atorvastatin ordered.  - She is already on ACEI and newly started on lasix. This can likely be stopped as she appears euvolemic today  Signed, Cristina Gong, MD 02/03/2016, 9:19 PM

## 2016-02-03 NOTE — Telephone Encounter (Signed)
Paged by [facility] lab. Troponin of 0.43. Spoke with patient and daughter. No chest pain however continues to have dyspnea. Daughter will take the patient to [facility] ER for further evaluation. She will need admission and cath tomorrow. Pending other labs.

## 2016-02-03 NOTE — ED Provider Notes (Signed)
[facility] DEPT Provider Note   CSN: [id number] Arrival date & time: 02/03/16  1942     History   Chief Complaint Chief Complaint  Patient presents with  . Shortness of Breath    HPI Catherine Snyder is a 75 y.o. female.  HPI Patient presents to the emergency room to be admitted to the hospital. Patient has been having trouble with shortness of breath. Patient has had difficulty with worsening over the past week. Prior to that she would be raking leaves in the yard and mopping floors without any difficulty. Starting last Wednesday she was unable to do those activities without getting short of breath. No even walking across the room in her house or climbing 6 stairs she will get short of breath. Denies any chest discomfort. No lightheadedness or dizziness. Patient saw her primary care provider yesterday.  She was urgently referred to cardiology and was seen in the office today. Plans were made for an outpatient echocardiogram and cardiac catheterization this Monday. Patient had outpatient laboratory tests that showed an elevated troponin. She was told to come back to the hospital to be admitted to the hospital in anticipation of cardiac catheterization tomorrow.  She denies any chest pain.  She denies any shortness of breath at rest. Past Medical History:  Diagnosis Date  . Bladder cancer ([place])    a. s/p resection in 06/2015  . Dysuria   . Frequency of urination   . Hematuria   . Hypertension   . Nocturia   . Wears glasses   . Wears partial dentures    upper    Patient Active Problem List   Diagnosis Date Noted  . Hypertension 11/27/2014    Past Surgical History:  Procedure Laterality Date  . CYSTOSCOPY W/ RETROGRADES Bilateral 07/07/2015   Procedure: CYSTOSCOPY WITH RETROGRADE PYELOGRAM;  Surgeon: Cleon Gustin, MD;  Location: [facility];  Service: Urology;  Laterality: Bilateral;  . NO PAST SURGERIES    . TRANSURETHRAL RESECTION OF BLADDER TUMOR  WITH GYRUS (TURBT-GYRUS) N/A 07/07/2015   Procedure: TRANSURETHRAL RESECTION OF BLADDER TUMOR WITH GYRUS (TURBT-GYRUS);  Surgeon: Cleon Gustin, MD;  Location: [facility];  Service: Urology;  Laterality: N/A;    OB History    No data available       Home Medications    Prior to Admission medications   Medication Sig Start Date End Date Taking? Authorizing Provider  amLODipine (NORVASC) 10 MG tablet take 1 tablet by mouth once daily 10/21/15   Gildardo Cranker, DO  aspirin 81 MG chewable tablet Chew 81 mg by mouth daily.    Historical Provider, MD  furosemide (LASIX) 20 MG tablet Take 1 tablet (20 mg total) by mouth daily. 02/02/16   Lauree Chandler, NP  lisinopril (PRINIVIL,ZESTRIL) 2.5 MG tablet Take 1 tablet (2.5 mg total) by mouth daily. 02/03/16 05/03/16  Erma Heritage, PA    Family History Family History  Problem Relation Age of Onset  . CAD Mother 38  . Heart disease Father 59  . CAD Brother 42    Social History Social History  Substance Use Topics  . Smoking status: Never Smoker  . Smokeless tobacco: Never Used  . Alcohol use No     Allergies   Patient has no known allergies.   Review of Systems Review of Systems  All other systems reviewed and are negative.    Physical Exam Updated Vital Signs BP 115/77 (BP Location: Right Arm)   Pulse  84   Temp 97.7 F (36.5 C) (Oral)   Resp 19   Ht 5\' 3"  (1.6 m)   Wt 77.1 kg   SpO2 100%   BMI 30.11 kg/m   Physical Exam  Constitutional: No distress.  HENT:  Head: Normocephalic and atraumatic.  Right Ear: External ear normal.  Left Ear: External ear normal.  Eyes: Conjunctivae are normal. Right eye exhibits no discharge. Left eye exhibits no discharge. No scleral icterus.  Neck: Neck supple. No tracheal deviation present.  Cardiovascular: Normal rate, regular rhythm and intact distal pulses.   Pulmonary/Chest: Effort normal and breath sounds normal. No stridor. No respiratory distress.  She has no wheezes. She has no rales.  Abdominal: Soft. Bowel sounds are normal. She exhibits no distension. There is no tenderness. There is no rebound and no guarding.  Musculoskeletal: She exhibits no edema or tenderness.  Neurological: She is alert. She has normal strength. No cranial nerve deficit (no facial droop, extraocular movements intact, no slurred speech) or sensory deficit. She exhibits normal muscle tone. She displays no seizure activity. Coordination normal.  Skin: Skin is warm and dry. No rash noted. She is not diaphoretic.  Psychiatric: She has a normal mood and affect.  Nursing note and vitals reviewed.    ED Treatments / Results  Labs (all labs ordered are listed, but only abnormal results are displayed) Labs Reviewed  CBC WITH DIFFERENTIAL/PLATELET  BASIC METABOLIC PANEL  I-STAT TROPOININ, ED    EKG  EKG Interpretation  Date/Time:  Tuesday February 03 2016 19:47:07 EST Ventricular Rate:  90 PR Interval:  142 QRS Duration: 86 QT Interval:  394 QTC Calculation: 481 R Axis:   -13 Text Interpretation:  Normal sinus rhythm Septal infarct , age undetermined Abnormal ECG Nonspecific T wave abnormality No old tracing to compare Confirmed by [facility]  MD, DAVID ([id number]) on 02/03/2016 7:57:11 PM        Procedures Procedures (including critical care time)  Medications Ordered in ED Medications - No data to display   Initial Impression / Assessment and Plan / ED Course  I have reviewed the triage vital signs and the nursing notes.  Pertinent labs & imaging results that were available during my care of the patient were reviewed by me and considered in my medical decision making (see chart for details).  Clinical Course as of Feb 02 2053  Tue Feb 03, 2016  2050 Labs reviewed from earlier today.  Elevated troponin and BNP noted.  [JK]    Clinical Course User Index [JK] Dorie Rank, MD    Pt was seen in the cardiology office today.  Sx are concerning for an  anginal equivalent.  No chest pain now at rest.  Abnormal outpatient troponin.  Plan is for cardiac cath tomorrow.  Discussed with Cardiology on call.  Will plan on admission.  Final Clinical Impressions(s) / ED Diagnoses   Final diagnoses:  Elevated troponin  Dyspnea on exertion    New Prescriptions New Prescriptions   No medications on file     Dorie Rank, MD 02/03/16 2055

## 2016-02-03 NOTE — ED Notes (Signed)
Delay in lab draw,  edp talking to pt.

## 2016-02-03 NOTE — Progress Notes (Signed)
Cardiology Office Note    Date:  02/03/2016   ID:  Catherine Snyder, DOB 03-Jan-1941, MRN [id number]  PCP:  Gildardo Cranker, DO  Cardiologist:  New to [facility] - Dr. Claiborne Billings  Chief Complaint  Patient presents with  . Shortness of Breath    History of Present Illness:    Catherine Snyder is a 75 y.o. female with past medical history of HTN, bladder CA (s/p resection in 06/2015), and family history of CAD who presents to the office today as a new patient referral from her PCP.  Was just seen by her PCP yesterday for worsening dyspnea with exertion. Reported having these symptoms when walking around the house or climbing 6 stairs in the home. She denies any associated chest discomfort but did report having a dry cough and occasional palpitations. Oxygen saturations were checked in the office and noted to go from 94% to 91% with ambulation. BMET performed and showed stable electrolytes with creatinine stable at 0.76.  EKG was reported as showing new TWI in V2 and V3 which were new from 04/2015, granted this has not yet been scanned into the computer for review. She was started on PO Lasix 20mg  daily and referred for outpatient Cardiology follow-up.   In talking with the patient today, she reports having worsening dyspnea with exertion for the past week. She is usually able to perform activities without any symptoms. Has been raking leaves within the past month and also mopping floors. Starting last Wednesday, she was unable to perform these activities. She now becomes short of breath with walking less than 5 feet in her house or climbing 6 stairs. She denies any associated chest discomfort, palpitations, lightheadedness, or dizziness. No associated orthopnea, PND, or lower extremity edema. Her daughter who is with her today is very concerned at how last her mother's condition has deteriorated.   She denies any prior cardiac history. No known MIs or cardiac arrhythmias. No history of hyperlipidemia  or Type II DM. No prior tobacco use. Does have an occasional glass of red wine. No recreational drug use. She does have a significant family history of coronary disease, with her brother having a fatal MI in his 61's, father having an MI in his 65's, and her mother had a fatal MI at age 67.   Past Medical History:  Diagnosis Date  . Bladder cancer ([place])    a. s/p resection in 06/2015  . Dysuria   . Frequency of urination   . Hematuria   . Hypertension   . Nocturia   . Wears glasses   . Wears partial dentures    upper    Past Surgical History:  Procedure Laterality Date  . CYSTOSCOPY W/ RETROGRADES Bilateral 07/07/2015   Procedure: CYSTOSCOPY WITH RETROGRADE PYELOGRAM;  Surgeon: Cleon Gustin, MD;  Location: [facility];  Service: Urology;  Laterality: Bilateral;  . NO PAST SURGERIES    . TRANSURETHRAL RESECTION OF BLADDER TUMOR WITH GYRUS (TURBT-GYRUS) N/A 07/07/2015   Procedure: TRANSURETHRAL RESECTION OF BLADDER TUMOR WITH GYRUS (TURBT-GYRUS);  Surgeon: Cleon Gustin, MD;  Location[facility];  Service: Urology;  Laterality: N/A;    Current Medications: Outpatient Medications Prior to Visit  Medication Sig Dispense Refill  . amLODipine (NORVASC) 10 MG tablet take 1 tablet by mouth once daily 90 tablet 1  . aspirin 81 MG chewable tablet Chew 81 mg by mouth daily.    . furosemide (LASIX) 20 MG tablet Take 1  tablet (20 mg total) by mouth daily. 30 tablet 1   No facility-administered medications prior to visit.      Allergies:   Patient has no known allergies.   Social History   Social History  . Marital status: Single    Spouse name: N/A  . Number of children: N/A  . Years of education: N/A   Occupational History  . Retired Automotive engineer    Social History Main Topics  . Smoking status: Never Smoker  . Smokeless tobacco: Never Used  . Alcohol use No  . Drug use: No  . Sexual activity: No   Other Topics Concern  . None    Social History Narrative    Diet:   Do you drink/eat things with caffeine? Yes coffee   Marital status: Divorced                              What year were you married?    Do you live in a house, apartment, assisted living, condo, trailer, etc)? House   Is it one or more stories? 1 story   How many persons live in your home? 4   Do you have any pets in your home? 0   Current or past profession: Dietitian   Do you exercise? Yes                                       Type & how often:  Walk daily   Do you have a living will? No   Do you have a DNR Form? No   Do you have a POA/HPOA forms? No     Family History:  The patient's family history includes Heart disease in her father.   Review of Systems:   Please see the history of present illness.     General:  No chills, fever, night sweats or weight changes.  Cardiovascular:  No chest pain, edema, orthopnea, palpitations, paroxysmal nocturnal dyspnea. Positive for dyspnea with exertion.  Dermatological: No rash, lesions/masses Respiratory: No cough, Positive for dyspnea. Urologic: No hematuria, dysuria Abdominal:   No nausea, vomiting, diarrhea, bright red blood per rectum, melena, or hematemesis Neurologic:  No visual changes, wkns, changes in mental status. All other systems reviewed and are otherwise negative except as noted above.   Physical Exam:    VS:  BP 126/70   Pulse 78   Ht 5\' 3"  (1.6 m)   Wt 171 lb (77.6 kg)   BMI 30.29 kg/m    General: Well developed, well nourished [place] American female appearing in no acute distress. Head: Normocephalic, atraumatic, sclera non-icteric, no xanthomas, nares are without discharge.  Neck: No carotid bruits. JVD not elevated.  Lungs: Respirations regular and unlabored, without wheezes or rales.  Heart: Regular rate and rhythm with occasional ectopic beats. No S3 or S4.  No murmur, no rubs, or gallops appreciated. Abdomen: Soft, non-tender, non-distended with normoactive bowel  sounds. No hepatomegaly. No rebound/guarding. No obvious abdominal masses. Msk:  Strength and tone appear normal for age. No joint deformities or effusions. Extremities: No clubbing or cyanosis. No edema.  Distal pedal pulses are 2+ bilaterally. Neuro: Alert and oriented X 3. Moves all extremities spontaneously. No focal deficits noted. Psych:  Responds to questions appropriately with a normal affect. Skin: No rashes or lesions noted  Wt Readings from Last 3 Encounters:  02/03/16 171 lb (77.6 kg)  02/02/16 170 lb 9.6 oz (77.4 kg)  07/07/15 176 lb (79.8 kg)     Studies/Labs Reviewed:   EKG:  EKG is ordered today. The ekg ordered today demonstrates NSR with PAC's, HR 81, and TWI in V3 (new from tracing in 04/2015) with diffuse T-wave   Recent Labs: 02/02/2016: BUN 13; Creat 0.76; Hemoglobin 10.7; Platelets 277; Potassium 4.1; Sodium 140   Lipid Panel    Component Value Date/Time   CHOL 182 11/27/2014 1018   TRIG 128 11/27/2014 1018   HDL 43 11/27/2014 1018   CHOLHDL 4.2 11/27/2014 1018   LDLCALC 113 (H) 11/27/2014 1018    Additional studies/ records that were reviewed today include:  EKG: 04/2015: NSR with occasional PAC's. T-wave flattening along anterior leads.   Assessment:    1. DOE (dyspnea on exertion)   2. Essential hypertension   3. Family history of premature CAD   4. Preoperative testing     Plan:   In order of problems listed above:  1. Dyspnea on Exertion - the patient reports having worsening dyspnea with exertion for the past week. Had been very active at baseline, performing yard work and cooking. Now, she becomes short of breath with walking less than 5 feet in her house or climbing 6 stairs. She denies any associated chest discomfort, palpitations, lightheadedness, or dizziness. No associated orthopnea, PND, or lower extremity edema. No prior cardiac history.  - she does not appear volume overloaded on physical exam. I am concerned this new onset dyspnea  with exertion is her anginal equivalent. She does have cardiac risk factors including HTN and a strong family history of CAD. - discussed the patient with Dr. Claiborne Billings ([facility] DOD) who also evaluated the patient. The option was given to have a cardiac catheterization tomorrow or this upcoming Monday (02/09/2016). The patient wishes to wait until Monday so she can be home for Thanksgiving. We will plan for a STAT echocardiogram tomorrow to assess LV function and wall motion. Will check pre-cath labs along with a STAT Troponin today. If her troponin is elevated, she is aware she will need to be directly admitted to the hospital today. If she develops chest discomfort with exertion or at rest, she was instructed to proceed to the ED.  - we will plan for a cardiac catheterization on 02/09/2016 with Dr. Claiborne Billings. The patient understands that risks included but are not limited to stroke (1 in 1000), death (1 in 84), kidney failure [usually temporary] (1 in 500), bleeding (1 in 200), allergic reaction [possibly serious] (1 in 200).  - continue ASA, Amlodipine, and Lasix. Will add Lisinopril 2.5mg  daily to assist with afterload reduction (reports a history of hypotension so will start with a lower-dose which can be titrated as BP allows).   2. Essential HTN - BP well-controlled at 126/70 at today's visit.  - continue Amlodipine 10mg  daily and Lasix 20mg  daily. Add Lisinopril 2.5mg  daily as above.   3. Family History of CAD - the patient has a significant family history of CAD with her brother having a fatal MI in his 63's, father having an MI in his 84's, and her mother having a fatal MI at age 71. - plan for ischemic evaluation as above in the setting of her new-onset dyspnea with exertion.   4. Chronic Microcytic Anemia - Hgb at 10.7 on 02/02/2016, close to baseline. - the patient denies any evidence of active bleeding. Continue to monitor.    Medication Adjustments/Labs  and Tests Ordered: Current medicines  are reviewed at length with the patient today.  Concerns regarding medicines are outlined above.  Medication changes, Labs and Tests ordered today are listed in the Patient Instructions below. Patient Instructions  Medication Instructions: START Lisinopril 2.5 mg daily.  Labwork: Your physician recommends that you return for lab work today: STAT--BNP, Troponin; CBC, BMET, TSH, PTT, PT-INR   Testing/Procedures: Your physician has requested that you have an echocardiogram--scheduled for 11/22 at 1 pm at [address] Echocardiography is a painless test that uses sound waves to create images of your heart. It provides your doctor with information about the size and shape of your heart and how well your heart's chambers and valves are working. This procedure takes approximately one hour. There are no restrictions for this procedure.  Your physician has requested that you have a cardiac catheterization--11/27 with Dr. Claiborne Billings. Cardiac catheterization is used to diagnose and/or treat various heart conditions. Doctors may recommend this procedure for a number of different reasons. The most common reason is to evaluate chest pain. Chest pain can be a symptom of coronary artery disease (CAD), and cardiac catheterization can show whether plaque is narrowing or blocking your heart's arteries. This procedure is also used to evaluate the valves, as well as measure the blood flow and oxygen levels in different parts of your heart. For further information please visit [email or web address][email or web address].   Following your catheterization, you will not be allowed to drive for 3 days.  No lifting, pushing, or pulling greater that 10 pounds is allowed for 1 week.  You will be required to have the following tests prior to the procedure:  1. Blood work-the blood work can be done no more than 14 days prior to the procedure.  It can be done at any [facility] lab.  There is one downstairs on the first floor of this building  and one in the [facility] building [phone]).  2. Chest Xray-the chest xray order has already been placed at the [facility] Building--schedule for 02/04/16..     Follow-Up: After CATH,  Signed, Erma Heritage, PA  02/03/2016 5:40 PM    [facility] [address], [place], [place]  [address] Phone: [phone]; Fax: [phone]  [address] [place], [place] [address] Phone: [phone]

## 2016-02-03 NOTE — Patient Instructions (Signed)
Medication Instructions: START Lisinopril 2.5 mg daily.  Labwork: Your physician recommends that you return for lab work today: STAT--BNP, Troponin; CBC, BMET, TSH, PTT, PT-INR   Testing/Procedures: Your physician has requested that you have an echocardiogram--scheduled for 11/22 at 1 pm at [address] [place] Echocardiography is a painless test that uses sound waves to create images of your heart. It provides your doctor with information about the size and shape of your heart and how well your heart's chambers and valves are working. This procedure takes approximately one hour. There are no restrictions for this procedure.  Your physician has requested that you have a cardiac catheterization--11/27 with Dr. Claiborne Billings. Cardiac catheterization is used to diagnose and/or treat various heart conditions. Doctors may recommend this procedure for a number of different reasons. The most common reason is to evaluate chest pain. Chest pain can be a symptom of coronary artery disease (CAD), and cardiac catheterization can show whether plaque is narrowing or blocking your heart's arteries. This procedure is also used to evaluate the valves, as well as measure the blood flow and oxygen levels in different parts of your heart. For further information please visit [email or web address][email or web address].   Following your catheterization, you will not be allowed to drive for 3 days.  No lifting, pushing, or pulling greater that 10 pounds is allowed for 1 week.  You will be required to have the following tests prior to the procedure:  1. Blood work-the blood work can be done no more than 14 days prior to the procedure.  It can be done at any [facility] lab.  There is one downstairs on the first floor of this building and one in the [facility] building [phone] [address]).  2. Chest Xray-the chest xray order has already been placed at the [facility] Building--schedule for 02/04/16..       Follow-Up: After CATH,

## 2016-02-04 ENCOUNTER — Inpatient Hospital Stay (HOSPITAL_COMMUNITY): Payer: PPO

## 2016-02-04 ENCOUNTER — Other Ambulatory Visit (HOSPITAL_COMMUNITY): Payer: PPO

## 2016-02-04 ENCOUNTER — Encounter (HOSPITAL_COMMUNITY): Payer: Self-pay | Admitting: Cardiovascular Disease

## 2016-02-04 ENCOUNTER — Encounter (HOSPITAL_COMMUNITY)
Admission: EM | Disposition: A | Discharge status: Home / Self Care | Payer: Self-pay | Source: Home / Self Care | Attending: Cardiology

## 2016-02-04 DIAGNOSIS — I2721 Secondary pulmonary arterial hypertension: Secondary | ICD-10-CM | POA: Diagnosis not present

## 2016-02-04 DIAGNOSIS — I2609 Other pulmonary embolism with acute cor pulmonale: Secondary | ICD-10-CM | POA: Diagnosis not present

## 2016-02-04 DIAGNOSIS — I10 Essential (primary) hypertension: Secondary | ICD-10-CM | POA: Diagnosis not present

## 2016-02-04 DIAGNOSIS — I97618 Postprocedural hemorrhage and hematoma of a circulatory system organ or structure following other circulatory system procedure: Secondary | ICD-10-CM | POA: Diagnosis not present

## 2016-02-04 DIAGNOSIS — R0609 Other forms of dyspnea: Secondary | ICD-10-CM

## 2016-02-04 DIAGNOSIS — Z66 Do not resuscitate: Secondary | ICD-10-CM | POA: Diagnosis not present

## 2016-02-04 DIAGNOSIS — R918 Other nonspecific abnormal finding of lung field: Secondary | ICD-10-CM | POA: Diagnosis not present

## 2016-02-04 DIAGNOSIS — I2510 Atherosclerotic heart disease of native coronary artery without angina pectoris: Secondary | ICD-10-CM | POA: Diagnosis not present

## 2016-02-04 DIAGNOSIS — I959 Hypotension, unspecified: Secondary | ICD-10-CM | POA: Diagnosis not present

## 2016-02-04 DIAGNOSIS — I255 Ischemic cardiomyopathy: Secondary | ICD-10-CM

## 2016-02-04 DIAGNOSIS — I248 Other forms of acute ischemic heart disease: Secondary | ICD-10-CM | POA: Diagnosis not present

## 2016-02-04 DIAGNOSIS — Z8551 Personal history of malignant neoplasm of bladder: Secondary | ICD-10-CM | POA: Diagnosis not present

## 2016-02-04 DIAGNOSIS — I2729 Other secondary pulmonary hypertension: Secondary | ICD-10-CM | POA: Diagnosis not present

## 2016-02-04 DIAGNOSIS — I2692 Saddle embolus of pulmonary artery without acute cor pulmonale: Secondary | ICD-10-CM | POA: Diagnosis not present

## 2016-02-04 DIAGNOSIS — R778 Other specified abnormalities of plasma proteins: Secondary | ICD-10-CM

## 2016-02-04 DIAGNOSIS — J9601 Acute respiratory failure with hypoxia: Secondary | ICD-10-CM | POA: Diagnosis not present

## 2016-02-04 DIAGNOSIS — R7989 Other specified abnormal findings of blood chemistry: Secondary | ICD-10-CM

## 2016-02-04 DIAGNOSIS — I25118 Atherosclerotic heart disease of native coronary artery with other forms of angina pectoris: Secondary | ICD-10-CM | POA: Diagnosis not present

## 2016-02-04 HISTORY — PX: CARDIAC CATHETERIZATION: SHX172

## 2016-02-04 LAB — CBC
HEMATOCRIT: 32.7 % — AB (ref 36.0–46.0)
HEMATOCRIT: 31.9 % — AB (ref 36.0–46.0)
Hemoglobin: 10.1 g/dL — ABNORMAL LOW (ref 12.0–15.0)
Hemoglobin: 9.7 g/dL — ABNORMAL LOW (ref 12.0–15.0)
MCH: 22.3 pg — AB (ref 26.0–34.0)
MCH: 22 pg — ABNORMAL LOW (ref 26.0–34.0)
MCHC: 30.9 g/dL (ref 30.0–36.0)
MCHC: 30.4 g/dL (ref 30.0–36.0)
MCV: 72.3 fL — AB (ref 78.0–100.0)
MCV: 72.3 fL — ABNORMAL LOW (ref 78.0–100.0)
Platelets: 234 10*3/uL (ref 150–400)
Platelets: 223 10*3/uL (ref 150–400)
RBC: 4.52 MIL/uL (ref 3.87–5.11)
RBC: 4.41 MIL/uL (ref 3.87–5.11)
RDW: 20 % — AB (ref 11.5–15.5)
RDW: 19.8 % — AB (ref 11.5–15.5)
WBC: 4.9 10*3/uL (ref 4.0–10.5)
WBC: 4.3 10*3/uL (ref 4.0–10.5)

## 2016-02-04 LAB — BASIC METABOLIC PANEL
Anion gap: 10 (ref 5–15)
BUN: 12 mg/dL (ref 6–20)
CHLORIDE: 109 mmol/L (ref 101–111)
CO2: 22 mmol/L (ref 22–32)
Calcium: 8.8 mg/dL — ABNORMAL LOW (ref 8.9–10.3)
Creatinine, Ser: 0.84 mg/dL (ref 0.44–1.00)
GFR calc Af Amer: >60 mL/min (ref >60)
GFR calc non Af Amer: >60 mL/min (ref >60)
GLUCOSE: 134 mg/dL — AB (ref 65–99)
POTASSIUM: 3.6 mmol/L (ref 3.5–5.1)
Sodium: 141 mmol/L (ref 135–145)

## 2016-02-04 LAB — POCT I-STAT 3, ART BLOOD GAS (G3+)
ACID-BASE DEFICIT: 3 mmol/L — AB (ref 0.0–2.0)
BICARBONATE: 20.1 mmol/L (ref 20.0–28.0)
O2 SAT: 93 %
PO2 ART: 62 mmHg — AB (ref 83.0–108.0)
TCO2: 21 mmol/L (ref 0–100)
pCO2 arterial: 28.1 mmHg — ABNORMAL LOW (ref 32.0–48.0)
pH, Arterial: 7.462 — ABNORMAL HIGH (ref 7.350–7.450)

## 2016-02-04 LAB — POCT I-STAT 3, VENOUS BLOOD GAS (G3P V)
Acid-base deficit: 2 mmol/L (ref 0.0–2.0)
Bicarbonate: 21.7 mmol/L (ref 20.0–28.0)
O2 SAT: 48 %
PCO2 VEN: 32.3 mmHg — AB (ref 44.0–60.0)
PO2 VEN: 25 mmHg — AB (ref 32.0–45.0)
TCO2: 23 mmol/L (ref 0–100)
pH, Ven: 7.435 — ABNORMAL HIGH (ref 7.250–7.430)

## 2016-02-04 LAB — TROPONIN I
TROPONIN I: 0.27 ng/mL — AB (ref <0.03)
TROPONIN I: 0.28 ng/mL — AB (ref <0.03)
TROPONIN I: 0.29 ng/mL — AB (ref <0.03)

## 2016-02-04 LAB — ECHOCARDIOGRAM COMPLETE
Height: 63 in
Weight: 2668.8 oz

## 2016-02-04 LAB — CREATININE, SERUM: Creatinine, Ser: 0.79 mg/dL (ref 0.44–1.00)

## 2016-02-04 LAB — CARDIAC CATHETERIZATION: Cath EF Quantitative: 50 %

## 2016-02-04 LAB — POCT ACTIVATED CLOTTING TIME: ACTIVATED CLOTTING TIME: 120 s

## 2016-02-04 LAB — HEPARIN LEVEL (UNFRACTIONATED): Heparin Unfractionated: 0.44 IU/mL (ref 0.30–0.70)

## 2016-02-04 SURGERY — RIGHT/LEFT HEART CATH AND CORONARY ANGIOGRAPHY
Anesthesia: LOCAL

## 2016-02-04 MED ORDER — FENTANYL CITRATE (PF) 100 MCG/2ML IJ SOLN
INTRAMUSCULAR | Status: DC | PRN
  Administered 2016-02-04: 25 ug via INTRAVENOUS

## 2016-02-04 MED ORDER — IOPAMIDOL (ISOVUE-370) INJECTION 76%
INTRAVENOUS | Status: AC
  Filled 2016-02-04: qty 100

## 2016-02-04 MED ORDER — SODIUM CHLORIDE 0.9 % IV SOLN: 250.0000 mL | INTRAVENOUS | Status: DC | PRN

## 2016-02-04 MED ORDER — ONDANSETRON HCL 4 MG/2ML IJ SOLN: 4.0000 mg | Freq: Four times a day (QID) | INTRAMUSCULAR | Status: DC | PRN

## 2016-02-04 MED ORDER — MIDAZOLAM HCL 2 MG/2ML IJ SOLN
INTRAMUSCULAR | Status: AC
  Filled 2016-02-04: qty 2

## 2016-02-04 MED ORDER — HEPARIN (PORCINE) IN NACL 2-0.9 UNIT/ML-% IJ SOLN
INTRAMUSCULAR | Status: AC
  Filled 2016-02-04: qty 1000

## 2016-02-04 MED ORDER — IOPAMIDOL (ISOVUE-370) INJECTION 76%
INTRAVENOUS | Status: DC | PRN
  Administered 2016-02-04: 75 mL via INTRA_ARTERIAL

## 2016-02-04 MED ORDER — SODIUM CHLORIDE 0.9 % IV SOLN: INTRAVENOUS | Status: DC

## 2016-02-04 MED ORDER — SODIUM CHLORIDE 0.9% FLUSH
3.0000 mL | Freq: Two times a day (BID) | INTRAVENOUS | Status: DC
  Administered 2016-02-04 – 2016-02-07 (×5): 3 mL via INTRAVENOUS

## 2016-02-04 MED ORDER — AMLODIPINE BESYLATE 10 MG PO TABS
10.0000 mg | ORAL_TABLET | Freq: Every day | ORAL | Status: DC
  Administered 2016-02-04 – 2016-02-11 (×8): 10 mg via ORAL
  Filled 2016-02-04 (×9): qty 1

## 2016-02-04 MED ORDER — SODIUM CHLORIDE 0.9% FLUSH: 3.0000 mL | INTRAVENOUS | Status: DC | PRN

## 2016-02-04 MED ORDER — TECHNETIUM TO 99M ALBUMIN AGGREGATED
4.1000 | Freq: Once | INTRAVENOUS | Status: AC | PRN
Start: 2016-02-04 — End: 2016-02-04
  Administered 2016-02-04: 4.1 via INTRAVENOUS

## 2016-02-04 MED ORDER — LIDOCAINE HCL (PF) 1 % IJ SOLN
INTRAMUSCULAR | Status: DC | PRN
  Administered 2016-02-04: 15 mL

## 2016-02-04 MED ORDER — LIDOCAINE HCL (PF) 1 % IJ SOLN
INTRAMUSCULAR | Status: AC
  Filled 2016-02-04: qty 30

## 2016-02-04 MED ORDER — ACETAMINOPHEN 325 MG PO TABS
650.0000 mg | ORAL_TABLET | ORAL | Status: DC | PRN
  Administered 2016-02-04 – 2016-02-07 (×2): 650 mg via ORAL
  Filled 2016-02-04: qty 2

## 2016-02-04 MED ORDER — FENTANYL CITRATE (PF) 100 MCG/2ML IJ SOLN
INTRAMUSCULAR | Status: AC
  Filled 2016-02-04: qty 2

## 2016-02-04 MED ORDER — ASPIRIN 81 MG PO CHEW
81.0000 mg | CHEWABLE_TABLET | Freq: Every day | ORAL | Status: DC
  Administered 2016-02-05 – 2016-02-11 (×7): 81 mg via ORAL
  Filled 2016-02-04 (×7): qty 1

## 2016-02-04 MED ORDER — MIDAZOLAM HCL 2 MG/2ML IJ SOLN
INTRAMUSCULAR | Status: DC | PRN
  Administered 2016-02-04: 2 mg via INTRAVENOUS

## 2016-02-04 MED ORDER — HEPARIN (PORCINE) IN NACL 2-0.9 UNIT/ML-% IJ SOLN
INTRAMUSCULAR | Status: DC | PRN
  Administered 2016-02-04: 1000 mL

## 2016-02-04 MED ORDER — DIAZEPAM 5 MG PO TABS
5.0000 mg | ORAL_TABLET | ORAL | Status: DC | PRN
  Administered 2016-02-04: 5 mg via ORAL
  Filled 2016-02-04: qty 1

## 2016-02-04 MED ORDER — TECHNETIUM TC 99M DIETHYLENETRIAME-PENTAACETIC ACID: 30.3000 | Freq: Once | INTRAVENOUS | Status: DC | PRN

## 2016-02-04 MED ORDER — ENOXAPARIN SODIUM 40 MG/0.4ML ~~LOC~~ SOLN: 40.0000 mg | SUBCUTANEOUS | Status: DC

## 2016-02-04 SURGICAL SUPPLY — 11 items
CATH INFINITI MULTIPACK ST 5F (CATHETERS) ×2 IMPLANT
CATH SWAN GANZ 7F STRAIGHT (CATHETERS) ×2 IMPLANT
CATH SWAN GANZ 7FR S TIP (CATHETERS) ×2 IMPLANT
KIT HEART LEFT (KITS) ×2 IMPLANT
PACK CARDIAC CATHETERIZATION (CUSTOM PROCEDURE TRAY) ×2 IMPLANT
SHEATH PINNACLE 5F 10CM (SHEATH) ×2 IMPLANT
SHEATH PINNACLE 7F 10CM (SHEATH) ×2 IMPLANT
SYR MEDRAD MARK V 150ML (SYRINGE) ×2 IMPLANT
TRANSDUCER W/STOPCOCK (MISCELLANEOUS) ×2 IMPLANT
WIRE EMERALD 3MM-J .025X260CM (WIRE) ×2 IMPLANT
WIRE EMERALD 3MM-J .035X150CM (WIRE) ×2 IMPLANT

## 2016-02-04 NOTE — Progress Notes (Addendum)
    The patient was seen in the morning prior to catheterization by Dr. Claiborne Billings who Know her quite well yesterday prior to her admission. She underwent cardiac catheterization which revealed minimal CAD but pulmonary hypertension was noted. I discussed with Dr. Claiborne Billings his thoughts, he was concerned about evaluating her overall global function and right ventricular function as well as pressures with an echocardiogram. We then discussed potential diagnoses that would lead to pulmonary hypertension with her started at presentation over last several months. We felt like excluding pulmonary embolus was necessary and therefore we will order a ventilation perfusion scan for this evening hopefully to be done prior to discharge. Would tentatively start her on oral intake coagulation in the morning pending the results of this evaluation.  She is on amlodipine at home which would be beneficial for pulmonary hypertension. She had a relatively low LVEDP, therefore I would be reluctant to push diuresis too far.  The plan for tonight will be to have her echocardiogram done and read as well as potentially VQ scan. The remainder evaluation can probably be done as an outpatient. I would want to do inhibitory oxygen saturations to evaluate if she would benefit from home oxygen therapy.  Otherwise I think the plan will be for evaluation overnight and then consider discharge tomorrow if her studies are done and she is stable from a hemodynamically standpoint.   Glenetta Hew, M.D., M.S. Interventional Cardiologist   Pager # [phone] Phone # [phone] [address] [place], [place] [address]   Addendum: The patient did have a VQ scan which showed intermediate probability with least to moderate wedge-shaped peripheral defects within the superior posterior right upper lobe as well as a small wedge-shaped defect in the posterior left upper lobe. This in conjunction with mild RV dilation and reduced RV  function on echo with elevated pressures on cath and echo would suggest potentially chronic PE.  We will start Xarelto treatment dose beginning tomorrow.  Glenetta Hew, MD

## 2016-02-04 NOTE — Progress Notes (Signed)
Pt bedrest has ended. No bleeding or complications at site. Will continue to monitor. Etta Quill, RN

## 2016-02-04 NOTE — Progress Notes (Signed)
  Echocardiogram 2D Echocardiogram has been performed.  Catherine Snyder 02/04/2016, 3:13 PM

## 2016-02-04 NOTE — Progress Notes (Signed)
Site area: rt groin Site Prior to Removal:  Level 0 Pressure Applied For:20 minutes Manual:   yes Patient Status During Pull: sleeping  Post Pull Site:  Level 0 Post Pull Instructions Given:  yes Post Pull Pulses Present: rt DP palpable Dressing Applied:  yes Bedrest begins @ 12:00 Comments:

## 2016-02-04 NOTE — Interval H&P Note (Signed)
Cath Lab Visit (complete for each Cath Lab visit)  Clinical Evaluation Leading to the Procedure:   ACS: No.  Non-ACS:    Anginal Classification: CCS III  Anti-ischemic medical therapy: Minimal Therapy (1 class of medications)  Non-Invasive Test Results: No non-invasive testing performed  Prior CABG: No previous CABG      History and Physical Interval Note:  02/04/2016 10:12 AM  Catherine Snyder  has presented today for surgery, with the diagnosis of SOB  The various methods of treatment have been discussed with the patient and family. After consideration of risks, benefits and other options for treatment, the patient has consented to  Procedure(s): RIGHT/LEFT HEART CATH AND CORONARY ANGIOGRAPHY (N/A) as a surgical intervention .  The patient's history has been reviewed, patient examined, no change in status, stable for surgery.  I have reviewed the patient's chart and labs.  Questions were answered to the patient's satisfaction.     Shelva Majestic

## 2016-02-05 DIAGNOSIS — I2699 Other pulmonary embolism without acute cor pulmonale: Secondary | ICD-10-CM

## 2016-02-05 DIAGNOSIS — R748 Abnormal levels of other serum enzymes: Secondary | ICD-10-CM

## 2016-02-05 DIAGNOSIS — I248 Other forms of acute ischemic heart disease: Secondary | ICD-10-CM

## 2016-02-05 DIAGNOSIS — I2729 Other secondary pulmonary hypertension: Secondary | ICD-10-CM

## 2016-02-05 DIAGNOSIS — I2510 Atherosclerotic heart disease of native coronary artery without angina pectoris: Secondary | ICD-10-CM | POA: Diagnosis not present

## 2016-02-05 DIAGNOSIS — I10 Essential (primary) hypertension: Secondary | ICD-10-CM

## 2016-02-05 HISTORY — DX: Other pulmonary embolism without acute cor pulmonale: I26.99

## 2016-02-05 HISTORY — DX: Other pulmonary embolism without acute cor pulmonale: I27.29

## 2016-02-05 LAB — CBC
HEMATOCRIT: 30.4 % — AB (ref 36.0–46.0)
Hemoglobin: 9.3 g/dL — ABNORMAL LOW (ref 12.0–15.0)
MCH: 22.2 pg — ABNORMAL LOW (ref 26.0–34.0)
MCHC: 30.6 g/dL (ref 30.0–36.0)
MCV: 72.6 fL — AB (ref 78.0–100.0)
Platelets: 221 10*3/uL (ref 150–400)
RBC: 4.19 MIL/uL (ref 3.87–5.11)
RDW: 19.9 % — ABNORMAL HIGH (ref 11.5–15.5)
WBC: 5.9 10*3/uL (ref 4.0–10.5)

## 2016-02-05 LAB — BASIC METABOLIC PANEL
Anion gap: 8 (ref 5–15)
BUN: 10 mg/dL (ref 6–20)
CALCIUM: 8.7 mg/dL — AB (ref 8.9–10.3)
CO2: 21 mmol/L — AB (ref 22–32)
CREATININE: 0.79 mg/dL (ref 0.44–1.00)
Chloride: 111 mmol/L (ref 101–111)
GFR calc non Af Amer: >60 mL/min (ref >60)
GLUCOSE: 116 mg/dL — AB (ref 65–99)
Potassium: 3.7 mmol/L (ref 3.5–5.1)
Sodium: 140 mmol/L (ref 135–145)

## 2016-02-05 MED ORDER — RIVAROXABAN 15 MG PO TABS
15.0000 mg | ORAL_TABLET | Freq: Two times a day (BID) | ORAL | Status: DC
  Administered 2016-02-05 – 2016-02-06 (×3): 15 mg via ORAL
  Filled 2016-02-05 (×3): qty 1

## 2016-02-05 NOTE — Progress Notes (Signed)
Patient Name: Catherine Snyder Date of Encounter: 02/05/2016  Primary Cardiologist: Claiborne Billings   Patient Profile     75-year-old woman with a history of hypertension, bladder cancer status post resection as well as a family history of CAD he was seen by cardiology on November 21 for progressively worsening exertional dyspnea simply walking around the house. No associated chest discomfort. She did have some T-wave inversions in V2 and V3 that were new from February 2017. Original plan was for her to have an echocardiogram and undergo outpatient right and left heart catheterization, however for the evaluation they checked BNP level and troponin levels which were elevated. She was therefore brought to the hospital for admission for possible non-STEMI. She was taken to the Cath Lab on November 22 did not show any obstructive coronary disease. What was noted was elevated right ventricular pressures and pulmonary hypertension.  Post Plan to check an echocardiogram and VQ scan. The echocardiogram confirmed elevated right-sided pressures with mild to moderately reduced RV function with mild RV dilation suggestive of pulmonary hypertension. The Cath Lab holding pressures were slightly lower than echocardiographic evaluation which is usually the case. There was however significant pulmonary hypertension noted. VQ scan performed yesterday suggested at least 3 possible wedge-shaped defects that could be consistent with pulmonary emboli of on certain acuity.  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 Her daughter is worried because she's been sleeping a lot since she's been here. Her only on  oxygen.  Inpatient Medications    Scheduled Meds: . amLODipine  10 mg Oral Daily  . aspirin  81 mg Oral Daily  . atorvastatin  80 mg Oral q1800  . Rivaroxaban  15 mg Oral BID WC  . sodium chloride flush  3 mL Intravenous Q12H   Continuous Infusions: . sodium chloride 125 mL/hr at 02/04/16 2300   PRN Meds: sodium chloride, acetaminophen, diazepam, nitroGLYCERIN, ondansetron (ZOFRAN) IV, sodium chloride flush, technetium TC 104M diethylenetriame-pentaacetic acid   Vital Signs    Vitals:   02/04/16 2152 02/04/16 2252 02/04/16 2327 02/05/16 0539  BP: (!) 150/86 120/88  138/88  Pulse: 71 62 66 68  Resp: 15 (!) 30 (!) 33 (!) 25  Temp: 98.2 F (36.8 C)   98.3 F (36.8 C)  TempSrc: Oral   Oral  SpO2: 96% (!) 88% 92% 96%  Weight:    76.3 kg (168 lb 4.8 oz)  Height:    5\' 3"  (1.6 m)    Intake/Output Summary (Last 24 hours) at 02/05/16 1015 Last data filed at 02/05/16 0900  Gross per 24 hour  Intake          1131.25 ml  Output                0 ml  Net          1131.25 ml   Filed Weights   02/03/16 2259 02/04/16 0536 02/05/16 0539  Weight: 75.7 kg (166 lb 12.8 oz) 75.7 kg (166 lb 12.8 oz) 76.3 kg (168 lb 4.8 oz)    Physical Exam   General: Well developed, well nourished African American female appearing in no acute distress.Relatively drowsy appearing  Neck: No carotid bruits. JVD not elevated.  Lungs: Respirations regular and unlabored, without wheezes or rales.  Heart: Regular rate and rhythm with occasional ectopic beats. No S3 or S4.  No murmur, no rubs, or gallops appreciated. Abdomen: Soft, non-tender, non-distended with normoactive bowel sounds. No hepatomegaly. No rebound/guarding. No obvious abdominal masses. Extremities: No clubbing or cyanosis. No edema.  Distal pedal pulses are 2+ bilaterally. Neuro: Alert and oriented X 3. Moves all extremities spontaneously. No focal deficits noted. Psych:  Responds to questions appropriately with a normal affect.  Labs     CBC  Recent Labs  02/03/16 1619 02/03/16 2046  02/04/16 1633 02/05/16 0301  WBC 5.0 4.6  < > 4.3 5.9  NEUTROABS 2,900 2.8  --   --   --   HGB 10.7* 10.6*  < > 9.7* 9.3*  HCT 34.4* 33.9*  < > 31.9* 30.4*  MCV 70.6* 72.0*  < > 72.3* 72.6*  PLT 261 256  < > 223 221  < > = values in this interval not displayed. Basic Metabolic Panel  Recent Labs  02/04/16 0741 02/04/16 1633 02/05/16 0301  NA 141  --  140  K 3.6  --  3.7  CL 109  --  111  CO2 22  --  21*  GLUCOSE 134*  --  116*  BUN 12  --  10  CREATININE 0.84 0.79 0.79  CALCIUM 8.8*  --  8.7*   Liver Function Tests No results for input(s): AST, ALT, ALKPHOS, BILITOT, PROT, ALBUMIN in the last 72 hours. No results for input(s): LIPASE, AMYLASE in the last 72 hours. Cardiac Enzymes  Recent Labs  02/04/16 0000 02/04/16 0741 02/04/16 1633  TROPONINI 0.29* 0.28* 0.27*   BNP Invalid input(s): POCBNP D-Dimer No results for input(s): DDIMER in the last 72 hours. Hemoglobin A1C No results for input(s): HGBA1C in the last 72 hours. Fasting Lipid Panel No results for input(s): CHOL, HDL, LDLCALC, TRIG, CHOLHDL, LDLDIRECT in the last 72 hours. Thyroid Function Tests  Recent Labs  02/03/16 1619  TSH 1.87    Telemetry    Normal sinus rhythm in 60s - Personally Reviewed  ECG    Not checked  Radiology    Dg Chest 2 View  Result Date: 02/03/2016 CLINICAL DATA:  Shortness of breath EXAM: CHEST  2 VIEW COMPARISON:  None. FINDINGS: No acute infiltrate, consolidation, or pleural effusion. Mild cardiomegaly without overt failure. Tortuous ectatic aorta with atherosclerosis. No pneumothorax. IMPRESSION: 1. Mild cardiomegaly without overt failure 2. Mildly tortuous aorta with atherosclerosis 3. No acute infiltrate Electronically Signed   By: Donavan Foil M.D.   On: 02/03/2016 22:18   Nm Pulmonary Perf And Vent  Result Date: 02/04/2016 CLINICAL DATA:  Pulmonary hypertension EXAM: NUCLEAR MEDICINE VENTILATION -  PERFUSION LUNG SCAN TECHNIQUE: Ventilation images were obtained in multiple projections using inhaled aerosol Tc-4m DTPA. Perfusion images were obtained in multiple projections after intravenous injection of Tc-59m MAA. RADIOPHARMACEUTICALS:  30.3 mCi Technetium-19m DTPA aerosol inhalation and 4.10 mCi Technetium-23m MAA IV COMPARISON:  Chest x-ray 02/03/2016 FINDINGS: Ventilation: No focal ventilation defect. Perfusion: There are at least 2 moderate wedge-shaped peripheral defects within the superior and posterior upper right lung. Additional small wedge-shaped perfusion defect posterior left upper lobe. There is cardiomegaly. IMPRESSION: Overall intermediate probability V/Q scan. Electronically Signed   By: Donavan Foil M.D.   On: 02/04/2016 21:31    Cardiac Studies    Right and Left Heart Catheterization 02/04/2016:  Mid LAD lesion, 40 %  stenosed.  Hemodynamic findings consistent with pulmonary hypertension. Fick Cardiac Output 3.85 L/min  Fick Cardiac Output Index 2.15 (L/min)/BSA  Thermal Cardiac Output 4.07 L/min  Thermal Cardiac Output Index 2.27 (L/min)/BSA  RA A Wave 8 mmHg  RA V Wave 5 mmHg  RA Mean 5 mmHg  RV Systolic Pressure 54 mmHg  RV Diastolic Pressure 0 mmHg  RV EDP 8 mmHg  PA Systolic Pressure 55 mmHg  PA Diastolic Pressure 3 mmHg  PA Mean 29 mmHg  PW A Wave 10 mmHg  PW V Wave 11 mmHg  PW Mean 7 mmHg  AO Systolic Pressure [id number] mmHg  AO Diastolic Pressure 67 mmHg  AO Mean 87 mmHg  LV Systolic Pressure [id number] mmHg  LV Diastolic Pressure 1 mmHg  LV EDP 3 mmHg  Arterial Occlusion Pressure Extended Systolic Pressure 99991111 mmHg  Arterial Occlusion Pressure Extended Diastolic Pressure 69 mmHg  Arterial Occlusion Pressure Extended Mean Pressure 87 mmHg  Left Ventricular Apex Extended Systolic Pressure [id number] mmHg  Left Ventricular Apex Extended Diastolic Pressure 2 mmHg  Left Ventricular Apex Extended EDP Pressure 5 mmHg -- would suggest primary pulmonary HTN     Low normal  to mild LV dysfunction with an ejection fraction of 50%.  There is left ventricular hypertrophy and a suggestion of subtle mild mid anterolateral hypocontractility.  Mild nonobstructive CAD with evidence for a 40% LAD.  She stenosis in the region of calcification in the LAD immediately after these septal perforator and diagonal vessel; normal left circumflex and normal RCA.  Dilated aortic root.  RECOMMENDATION: The patient has experienced significant dyspnea with exertion over the past several weeks which is new. Pulmonary pressures are elevated with  RV/PA systolic in the [id number].   She has normal left ventricular end-diastolic pressure.  I suspect the patient's dyspnea is secondary to her pulmonary hypertension.  A 2-D echo Doppler study will be done today to assess right-sided heart chambers.  Consider evaluation for possible PE in the etiology of PHTN, and consider an evaluation for possible obstructive sleep apnea.  If the above studies are negative, an evaluation for primary pulmonary hypertension may be necessary.   2-D Echocardiogram 02/04/2016: Moderate LVH. EF 55-60%. No regional wall motion and amount is. Grade 1 diastolic dysfunction. Moderately thickened aortic leaflets but no stenosis with mild regurgitation. Mildly dilated RV with mild moderately reduced function. Moderately dilated right atrium. Peak PA pressures estimated at 67 mmHg.  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 Based on her right heart findings, cannot exclude chronic pulmonary embolism as etiology for her pulmonary hypertension. --> Will start treatment dose Xarelto today.  Would still consider continued evaluation for other etiologies for pulmonary attention including OSA and pulmonary fibrosis.   Active Problems:   Elevated troponin/Demand ischemia of myocardium (HCC) - Not  NSTEMI; minimal coronary artery disease noted. .  Available data suggested secondary to potentially chronic thromboembolism is the source of the elevated right heart pressures and elevated troponin.      Dyspnea on exertion - most consistent now with prominent pulmonary hypertension with the leading cause potentially being chronic pulmonary emboli.  Okay to continue low-dose Lasix for discharge, but most likely with her low EDP, this is not a primary jugular issue.    Essential hypertension -- blood pressure looks stable on current medications   Coronary artery disease, non-occlusive - 40% LAD  lesion. Would continue risk factor modification - with statin and aspirin  I would like to see her in bleeding in the hallways today. We want to measure oxygen levels both at rest and while walking as well as while sleeping. This would be in determine if she would benefit from home oxygen. With her being his sleepy as she is I need to see she does off any sedation medicines. She is ambulating the hallway prove that she is stable. Without in mind I think the best plan will be to monitor her today ambulating with nursing, monitor oxygen levels were 44 hours to determine if she benefits from home oxygen. I would hope to discharge her tomorrow morning.  She will follow-up as scheduled with Dr. Claiborne Billings, and would potentially recommend Pulmonary/CHF-Pulmonary Hypertension clinic evaluation to consider potential additional treatment option being on amlodipine   Signed, Glenetta Hew, MD  02/05/2016, 10:15 AM

## 2016-02-05 NOTE — Discharge Instructions (Signed)
Information on my medicine - XARELTO (rivaroxaban)  This medication education was reviewed with me or my healthcare representative as part of my discharge preparation.   WHY WAS XARELTO PRESCRIBED FOR YOU? Xarelto was prescribed to treat blood clots that may have been found in the veins of your legs (deep vein thrombosis) or in your lungs (pulmonary embolism) and to reduce the risk of them occurring again.  What do you need to know about Xarelto? The starting dose is one 15 mg tablet taken TWICE daily with food for the FIRST 21 DAYS then on (enter date) 12/15  the dose is changed to one 20 mg tablet taken ONCE A DAY with your evening meal.  DO NOT stop taking Xarelto without talking to the health care provider who prescribed the medication.  Refill your prescription for 20 mg tablets before you run out.  After discharge, you should have regular check-up appointments with your healthcare provider that is prescribing your Xarelto.  In the future your dose may need to be changed if your kidney function changes by a significant amount.  What do you do if you miss a dose? If you are taking Xarelto TWICE DAILY and you miss a dose, take it as soon as you remember. You may take two 15 mg tablets (total 30 mg) at the same time then resume your regularly scheduled 15 mg twice daily the next day.  If you are taking Xarelto ONCE DAILY and you miss a dose, take it as soon as you remember on the same day then continue your regularly scheduled once daily regimen the next day. Do not take two doses of Xarelto at the same time.   Important Safety Information Xarelto is a blood thinner medicine that can cause bleeding. You should call your healthcare provider right away if you experience any of the following: ? Bleeding from an injury or your nose that does not stop. ? Unusual colored urine (red or dark brown) or unusual colored stools (red or black). ? Unusual bruising for unknown reasons. ? A  serious fall or if you hit your head (even if there is no bleeding).  Some medicines may interact with Xarelto and might increase your risk of bleeding while on Xarelto. To help avoid this, consult your healthcare provider or pharmacist prior to using any new prescription or non-prescription medications, including herbals, vitamins, non-steroidal anti-inflammatory drugs (NSAIDs) and supplements.  This website has more information on Xarelto: [email or web address]

## 2016-02-05 NOTE — Progress Notes (Signed)
SATURATION QUALIFICATIONS: (This note is used to comply with regulatory documentation for home oxygen)  Patient Saturations on Room Air at Rest = 100%  Patient Saturations on Room Air while Ambulating = 98%  Patient Saturations on 0 Liters of oxygen while Ambulating = 98%  Please briefly explain why patient needs home oxygen:  N/A

## 2016-02-06 ENCOUNTER — Inpatient Hospital Stay (HOSPITAL_COMMUNITY): Payer: PPO

## 2016-02-06 ENCOUNTER — Encounter (HOSPITAL_COMMUNITY): Payer: Self-pay | Admitting: Radiology

## 2016-02-06 DIAGNOSIS — R748 Abnormal levels of other serum enzymes: Secondary | ICD-10-CM | POA: Diagnosis not present

## 2016-02-06 DIAGNOSIS — R0602 Shortness of breath: Secondary | ICD-10-CM | POA: Diagnosis not present

## 2016-02-06 DIAGNOSIS — I2729 Other secondary pulmonary hypertension: Secondary | ICD-10-CM | POA: Diagnosis not present

## 2016-02-06 DIAGNOSIS — I2699 Other pulmonary embolism without acute cor pulmonale: Secondary | ICD-10-CM

## 2016-02-06 DIAGNOSIS — I248 Other forms of acute ischemic heart disease: Secondary | ICD-10-CM | POA: Diagnosis not present

## 2016-02-06 DIAGNOSIS — I2510 Atherosclerotic heart disease of native coronary artery without angina pectoris: Secondary | ICD-10-CM | POA: Diagnosis not present

## 2016-02-06 DIAGNOSIS — I2609 Other pulmonary embolism with acute cor pulmonale: Secondary | ICD-10-CM | POA: Diagnosis not present

## 2016-02-06 LAB — HEPARIN LEVEL (UNFRACTIONATED): Heparin Unfractionated: >2.2 IU/mL — ABNORMAL HIGH (ref 0.30–0.70)

## 2016-02-06 LAB — APTT: aPTT: 37 seconds — ABNORMAL HIGH (ref 24–36)

## 2016-02-06 MED ORDER — IOPAMIDOL (ISOVUE-370) INJECTION 76%
INTRAVENOUS | Status: AC
  Administered 2016-02-06: 80 mL
  Filled 2016-02-06: qty 100

## 2016-02-06 MED ORDER — HEPARIN (PORCINE) IN NACL 100-0.45 UNIT/ML-% IJ SOLN
1050.0000 [IU]/h | INTRAMUSCULAR | Status: DC
  Administered 2016-02-06: 1150 [IU]/h via INTRAVENOUS
  Administered 2016-02-07: 1050 [IU]/h via INTRAVENOUS
  Filled 2016-02-06 (×3): qty 250

## 2016-02-06 NOTE — Progress Notes (Signed)
Chief Complaint: Patient was seen in consultation today for PE lysis at the request of Dr. Glenetta Hew  Referring Physician(s): Dr. Glenetta Hew Dr. Baltazar Apo  Supervising Physician: Jacqulynn Cadet  Patient Status: [facility] - In-pt  History of Present Illness: Catherine Snyder is a 75 y.o. female admitted with SOB and chest discomfort. Had extensive workup and is found to have significant bilateral PE with evidence of right strain by both CTA and Echo imaging. She was initially placed on Xarelto. She continues to have DOE and feels very winded/SOB even with standing at the sink. PCMM and IR have been asked to eval for possible PE lysis. Pulmonary note and recs reviewed. PMHx, meds, labs, imaging, allergies reviewed Family at bedside. NO history of brain bleed, PUD,  Had TURBT in April of this year for bladder cancer and has recovered well from that.  Past Medical History:  Diagnosis Date  . Bladder cancer ([place])    a. s/p resection in 06/2015  . Dysuria   . Frequency of urination   . Hematuria   . Hypertension   . Nocturia   . Wears glasses   . Wears partial dentures    upper    Past Surgical History:  Procedure Laterality Date  . CARDIAC CATHETERIZATION N/A 02/04/2016   Procedure: RIGHT/LEFT HEART CATH AND CORONARY ANGIOGRAPHY;  Surgeon: Troy Sine, MD;  Location: [place] [facility];  Service: Cardiovascular;  Laterality: N/A;  . CYSTOSCOPY W/ RETROGRADES Bilateral 07/07/2015   Procedure: CYSTOSCOPY WITH RETROGRADE PYELOGRAM;  Surgeon: Cleon Gustin, MD;  Location: [facility];  Service: Urology;  Laterality: Bilateral;  . NO PAST SURGERIES    . TRANSURETHRAL RESECTION OF BLADDER TUMOR WITH GYRUS (TURBT-GYRUS) N/A 07/07/2015   Procedure: TRANSURETHRAL RESECTION OF BLADDER TUMOR WITH GYRUS (TURBT-GYRUS);  Surgeon: Cleon Gustin, MD;  Location: [facility];  Service: Urology;  Laterality: N/A;    Allergies: Patient has no  known allergies.  Medications:  Current Facility-Administered Medications:  .  0.9 %  sodium chloride infusion, 250 mL, Intravenous, PRN, Troy Sine, MD .  acetaminophen (TYLENOL) tablet 650 mg, 650 mg, Oral, Q4H PRN, Troy Sine, MD, 650 mg at 02/04/16 1233 .  amLODipine (NORVASC) tablet 10 mg, 10 mg, Oral, Daily, Leonie Man, MD, 10 mg at 02/06/16 0955 .  aspirin chewable tablet 81 mg, 81 mg, Oral, Daily, Troy Sine, MD, 81 mg at 02/06/16 0955 .  atorvastatin (LIPITOR) tablet 80 mg, 80 mg, Oral, q1800, Cristina Gong, MD, 80 mg at 02/05/16 1653 .  heparin ADULT infusion 100 units/mL (25000 units/22mL sodium chloride 0.45%), 1,150 Units/hr, Intravenous, Continuous, Leonie Man, MD .  nitroGLYCERIN (NITROSTAT) SL tablet 0.4 mg, 0.4 mg, Sublingual, Q5 Min x 3 PRN, Cristina Gong, MD .  ondansetron (ZOFRAN) injection 4 mg, 4 mg, Intravenous, Q6H PRN, Troy Sine, MD .  sodium chloride flush (NS) 0.9 % injection 3 mL, 3 mL, Intravenous, Q12H, Troy Sine, MD, 3 mL at 02/06/16 1000 .  sodium chloride flush (NS) 0.9 % injection 3 mL, 3 mL, Intravenous, PRN, Troy Sine, MD .  technetium TC 15M diethylenetriame-pentaacetic acid (DTPA) injection 123456 millicurie, 123456 millicurie, Inhalation, Once PRN, Sherryl Barters, MD    Family History  Problem Relation Age of Onset  . CAD Mother 73  . Heart disease Father 70  . CAD Brother 57    Social History   Social History  . Marital status:  Single    Spouse name: N/A  . Number of children: N/A  . Years of education: N/A   Occupational History  . Retired Automotive engineer    Social History Main Topics  . Smoking status: Never Smoker  . Smokeless tobacco: Never Used  . Alcohol use No  . Drug use: No  . Sexual activity: No   Other Topics Concern  . None   Social History Narrative    Diet:   Do you drink/eat things with caffeine? Yes coffee   Marital status: Divorced                              What year were you married?     Do you live in a house, apartment, assisted living, condo, trailer, etc)? House   Is it one or more stories? 1 story   How many persons live in your home? 4   Do you have any pets in your home? 0   Current or past profession: Dietitian   Do you exercise? Yes                                       Type & how often:  Walk daily   Do you have a living will? No   Do you have a DNR Form? No   Do you have a POA/HPOA forms? No    Review of Systems: A 12 point ROS discussed and pertinent positives are indicated in the HPI above.  All other systems are negative.  Review of Systems  Vital Signs: BP 120/72 (BP Location: Left Arm)   Pulse 68   Temp 98.4 F (36.9 C) (Oral)   Resp (!) 22   Ht 5\' 3"  (1.6 m)   Wt 166 lb 9.6 oz (75.6 kg)   SpO2 91%   BMI 29.51 kg/m   Physical Exam  Constitutional: She is oriented to person, place, and time. She appears well-developed. No distress.  HENT:  Head: Normocephalic.  Mouth/Throat: Oropharynx is clear and moist.  Neck: Normal range of motion. No JVD present. No tracheal deviation present.  Cardiovascular: Normal rate, regular rhythm and normal heart sounds.   Pulmonary/Chest: Effort normal and breath sounds normal. No respiratory distress. She has no wheezes.  Abdominal: Soft. Bowel sounds are normal. She exhibits no distension. There is no tenderness.  Neurological: She is alert and oriented to person, place, and time.  Psychiatric: She has a normal mood and affect. Judgment normal.    Mallampati Score:  MD Evaluation Airway: WNL Heart: WNL Abdomen: WNL Chest/ Lungs: WNL ASA  Classification: 3 Mallampati/Airway Score: One  Imaging: Dg Chest 2 View  Result Date: 02/03/2016 CLINICAL DATA:  Shortness of breath EXAM: CHEST  2 VIEW COMPARISON:  None. FINDINGS: No acute infiltrate, consolidation, or pleural effusion. Mild cardiomegaly without overt failure. Tortuous ectatic aorta with atherosclerosis. No pneumothorax. IMPRESSION: 1. Mild  cardiomegaly without overt failure 2. Mildly tortuous aorta with atherosclerosis 3. No acute infiltrate Electronically Signed   By: Donavan Foil M.D.   On: 02/03/2016 22:18   Ct Angio Chest Pe W Or Wo Contrast  Result Date: 02/06/2016 CLINICAL DATA:  Shortness of Breath, of positive V/Q scan EXAM: CT ANGIOGRAPHY CHEST WITH CONTRAST TECHNIQUE: Multidetector CT imaging of the chest was performed using the standard protocol during bolus administration of intravenous contrast. Multiplanar CT  image reconstructions and MIPs were obtained to evaluate the vascular anatomy. CONTRAST:  80 mL Isovue 370. COMPARISON:  02/04/2016 FINDINGS: Cardiovascular: Thoracic aorta shows diffuse atherosclerotic calcifications without aneurysmal dilatation. Pulmonary artery is well visualized and demonstrates significant bilateral pulmonary emboli. There is dilatation of the right ventricle with a an RV/LV ratio of 1.8 consistent with right heart strain. Coronary calcifications are noted. Mediastinum/Nodes: Thoracic inlet is within normal limits. No significant hilar or mediastinal adenopathy is noted. No axillary adenopathy is seen. Lungs/Pleura: Lungs are well aerated bilaterally without focal confluent infiltrate or sizable effusion. No findings to suggest pulmonary infarct are noted. No pneumothorax is seen. Upper Abdomen: Within normal limits. Musculoskeletal: Within normal limits. Review of the MIP images confirms the above findings. IMPRESSION: Positive for acute PE with CT evidence of right heart strain (RV/LV Ratio = 1.8) consistent with at least submassive (intermediate risk) PE. The presence of right heart strain has been associated with an increased risk of morbidity and mortality. Please activate Code PE by paging [phone]. Critical Value/emergent results were called by telephone at the time of interpretation on 02/06/2016 at 12:30 pm to Dr. Ellyn Hack, who verbally acknowledged these results. Electronically Signed   By:  Inez Catalina M.D.   On: 02/06/2016 12:31   Nm Pulmonary Perf And Vent  Result Date: 02/04/2016 CLINICAL DATA:  Pulmonary hypertension EXAM: NUCLEAR MEDICINE VENTILATION - PERFUSION LUNG SCAN TECHNIQUE: Ventilation images were obtained in multiple projections using inhaled aerosol Tc-9m DTPA. Perfusion images were obtained in multiple projections after intravenous injection of Tc-88m MAA. RADIOPHARMACEUTICALS:  30.3 mCi Technetium-76m DTPA aerosol inhalation and 4.10 mCi Technetium-1m MAA IV COMPARISON:  Chest x-ray 02/03/2016 FINDINGS: Ventilation: No focal ventilation defect. Perfusion: There are at least 2 moderate wedge-shaped peripheral defects within the superior and posterior upper right lung. Additional small wedge-shaped perfusion defect posterior left upper lobe. There is cardiomegaly. IMPRESSION: Overall intermediate probability V/Q scan. Electronically Signed   By: Donavan Foil M.D.   On: 02/04/2016 21:31    Labs:  CBC:  Recent Labs  02/03/16 2046 02/04/16 0741 02/04/16 1633 02/05/16 0301  WBC 4.6 4.9 4.3 5.9  HGB 10.6* 10.1* 9.7* 9.3*  HCT 33.9* 32.7* 31.9* 30.4*  PLT 256 234 223 221    COAGS:  Recent Labs  02/03/16 1619 02/06/16 1312  INR 1.2*  --   APTT 25 37*    BMP:  Recent Labs  02/03/16 1619 02/03/16 2046 02/04/16 0741 02/04/16 1633 02/05/16 0301  NA 140 139 141  --  140  K 3.4* 3.2* 3.6  --  3.7  CL 106 107 109  --  111  CO2 23 21* 22  --  21*  GLUCOSE 109* 130* 134*  --  116*  BUN 15 16 12   --  10  CALCIUM 8.9 9.1 8.8*  --  8.7*  CREATININE 0.84 1.00 0.84 0.79 0.79  GFRNONAA 69 54* >60 >60 >60  GFRAA 79 >60 >60 >60 >60    Assessment and Plan: Submassive PE with right heart strain Has not made much symptomatic progress on anticoagulation She is a good candidate for catheter directed/EKOS PE lysis therapy. Thorough discussion explaining indication and goal of this procedure vs conservative mgmt. Family and pt opts for lytic  therapy Labs reviewed. Xarelto has been stopped, heparin drip started. Risks and Benefits discussed with the patient including, but not limited to bleeding, possible life threatening bleeding and need for blood product transfusion, vascular injury, stroke, contrast induced renal failure, limb loss and  infection. All of the patient's questions were answered, patient is agreeable to proceed. Consent signed and in chart.    Thank you for this interesting consult.  I greatly enjoyed meeting Catherine Snyder and look forward to participating in their care.  A copy of this report was sent to the requesting provider on this date.  Electronically Signed: Ascencion Dike 02/06/2016, 4:49 PM   I spent a total of 40 minutes in face to face in clinical consultation, greater than 50% of which was counseling/coordinating care for PE lysis

## 2016-02-06 NOTE — Consult Note (Signed)
Name: Catherine Snyder MRN: [id number] DOB: November 14, 1940    ADMISSION DATE:  02/03/2016 CONSULTATION DATE:  02/06/16   REFERRING MD :  Dr Ellyn Hack, Cardiology  REASON FOR CONSULTATION:  Acute pulmonary embolism with right heart strain  BRIEF PATIENT DESCRIPTION: 75 year old woman with hypertension and treated bladder cancer, admitted for evaluation of severe dyspnea and found to have acute bilateral PE with right heart strain. Being considered for possible catheter directed lysis  SIGNIFICANT EVENTS  11/21 admitted for acute dyspnea, concern for possible CAD  STUDIES:  11/22 left and right catheterization >> nonobstructive coronary artery disease, pulmonary hypertension with estimated PA systolic in the [id number] >> V/q intermediate prob with wedge shaped defects,  11/24 CT-PA >> confirmed B large PE w evidence r heart strain   HISTORY OF PRESENT ILLNESS:  75 y.o.femalewith past medical history of HTN, bladder CA (s/p resection in 06/2015), and family history of CAD who presented to ED 11/21 with DOE and hypoxemia beginning 6 days PTA. Underwent L and R cath given suspicion for CAD. L cath was reassuring, R showed RV strain and PAH. V/q was intermediate prob, wedge shaped perfusion defects. CT-PA confirmed B large PE. PCCM consulted regarding suitability for catheter directed lytics.   PAST MEDICAL HISTORY :   has a past medical history of Bladder cancer ([place]); Dysuria; Frequency of urination; Hematuria; Hypertension; Nocturia; Wears glasses; and Wears partial dentures.  has a past surgical history that includes No past surgeries; Transurethral resection of bladder tumor with gyrus (turbt-gyrus) (N/A, 07/07/2015); Cystoscopy w/ retrogrades (Bilateral, 07/07/2015); and Cardiac catheterization (N/A, 02/04/2016). Prior to Admission medications   Medication Sig Start Date End Date Taking? Authorizing Provider  amLODipine (NORVASC) 10 MG tablet take 1 tablet by mouth once daily 10/21/15  Yes  Gildardo Cranker, DO  aspirin 81 MG chewable tablet Chew 81 mg by mouth daily.   Yes Historical Provider, MD  furosemide (LASIX) 20 MG tablet Take 1 tablet (20 mg total) by mouth daily. 02/02/16  Yes Lauree Chandler, NP  lisinopril (PRINIVIL,ZESTRIL) 2.5 MG tablet Take 1 tablet (2.5 mg total) by mouth daily. 02/03/16 05/03/16 Yes Erma Heritage, PA   No Known Allergies  FAMILY HISTORY:  family history includes CAD (age of onset: 81) in her brother; CAD (age of onset: 67) in her mother; Heart disease (age of onset: 60) in her father. SOCIAL HISTORY:  reports that she has never smoked. She has never used smokeless tobacco. She reports that she does not drink alcohol or use drugs.  REVIEW OF SYSTEMS:   Constitutional: Negative for fever, chills, weight loss, malaise/fatigue and diaphoresis.  HENT: Negative for hearing loss, ear pain, nosebleeds, congestion, sore throat, neck pain, tinnitus and ear discharge.   Eyes: Negative for blurred vision, double vision, photophobia, pain, discharge and redness.  Respiratory: Negative for cough, hemoptysis, sputum production, shortness of breath, wheezing and stridor.   Cardiovascular: Negative for chest pain, palpitations, orthopnea, claudication, leg swelling and PND.  Gastrointestinal: Negative for heartburn, nausea, vomiting, abdominal pain, diarrhea, constipation, blood in stool and melena.  Genitourinary: Negative for dysuria, urgency, frequency, hematuria and flank pain.  Musculoskeletal: Negative for myalgias, back pain, joint pain and falls.  Skin: Negative for itching and rash.  Neurological: Negative for dizziness, tingling, tremors, sensory change, speech change, focal weakness, seizures, loss of consciousness, weakness and headaches.  Endo/Heme/Allergies: Negative for environmental allergies and polydipsia. Does not bruise/bleed easily.  SUBJECTIVE:  Pt still with significant SOB with any exertion.   VITAL  SIGNS: Temp:  [98.3 F (36.8  C)-99 F (37.2 C)] 98.4 F (36.9 C) (11/24 1442) Pulse Rate:  [64-78] 68 (11/24 1442) Resp:  [22-56] 22 (11/24 0600) BP: (120-125)/(72-85) 120/72 (11/24 1442) SpO2:  [91 %-99 %] 91 % (11/24 1442) Weight:  [75.6 kg (166 lb 9.6 oz)] 75.6 kg (166 lb 9.6 oz) (11/24 0600)  PHYSICAL EXAMINATION: General:  Pleasant overwt woman, NAD Neuro:  Awake and appropriate, non-focal HEENT:  Op clear, no lesions Cardiovascular:  Regular, 2/6 sys M Lungs:  Distant, decreased at bases.  Abdomen:  benign   Recent Labs Lab 02/03/16 2046 02/04/16 0741 02/04/16 1633 02/05/16 0301  NA 139 141  --  140  K 3.2* 3.6  --  3.7  CL 107 109  --  111  CO2 21* 22  --  21*  BUN 16 12  --  10  CREATININE 1.00 0.84 0.79 0.79  GLUCOSE 130* 134*  --  116*    Recent Labs Lab 02/04/16 0741 02/04/16 1633 02/05/16 0301  HGB 10.1* 9.7* 9.3*  HCT 32.7* 31.9* 30.4*  WBC 4.9 4.3 5.9  PLT 234 223 221   Ct Angio Chest Pe W Or Wo Contrast  Result Date: 02/06/2016 CLINICAL DATA:  Shortness of Breath, of positive V/Q scan EXAM: CT ANGIOGRAPHY CHEST WITH CONTRAST TECHNIQUE: Multidetector CT imaging of the chest was performed using the standard protocol during bolus administration of intravenous contrast. Multiplanar CT image reconstructions and MIPs were obtained to evaluate the vascular anatomy. CONTRAST:  80 mL Isovue 370. COMPARISON:  02/04/2016 FINDINGS: Cardiovascular: Thoracic aorta shows diffuse atherosclerotic calcifications without aneurysmal dilatation. Pulmonary artery is well visualized and demonstrates significant bilateral pulmonary emboli. There is dilatation of the right ventricle with a an RV/LV ratio of 1.8 consistent with right heart strain. Coronary calcifications are noted. Mediastinum/Nodes: Thoracic inlet is within normal limits. No significant hilar or mediastinal adenopathy is noted. No axillary adenopathy is seen. Lungs/Pleura: Lungs are well aerated bilaterally without focal confluent infiltrate  or sizable effusion. No findings to suggest pulmonary infarct are noted. No pneumothorax is seen. Upper Abdomen: Within normal limits. Musculoskeletal: Within normal limits. Review of the MIP images confirms the above findings. IMPRESSION: Positive for acute PE with CT evidence of right heart strain (RV/LV Ratio = 1.8) consistent with at least submassive (intermediate risk) PE. The presence of right heart strain has been associated with an increased risk of morbidity and mortality. Please activate Code PE by paging [phone]. Critical Value/emergent results were called by telephone at the time of interpretation on 02/06/2016 at 12:30 pm to Dr. Ellyn Hack, who verbally acknowledged these results. Electronically Signed   By: Inez Catalina M.D.   On: 02/06/2016 12:31   Nm Pulmonary Perf And Vent  Result Date: 02/04/2016 CLINICAL DATA:  Pulmonary hypertension EXAM: NUCLEAR MEDICINE VENTILATION - PERFUSION LUNG SCAN TECHNIQUE: Ventilation images were obtained in multiple projections using inhaled aerosol Tc-23m DTPA. Perfusion images were obtained in multiple projections after intravenous injection of Tc-51m MAA. RADIOPHARMACEUTICALS:  30.3 mCi Technetium-57m DTPA aerosol inhalation and 4.10 mCi Technetium-45m MAA IV COMPARISON:  Chest x-ray 02/03/2016 FINDINGS: Ventilation: No focal ventilation defect. Perfusion: There are at least 2 moderate wedge-shaped peripheral defects within the superior and posterior upper right lung. Additional small wedge-shaped perfusion defect posterior left upper lobe. There is cardiomegaly. IMPRESSION: Overall intermediate probability V/Q scan. Electronically Signed   By: Donavan Foil M.D.   On: 02/04/2016 21:31    ASSESSMENT / PLAN: Acute bilateral pulmonary emboli Acute respiratory  failure with hypoxemia Secondary pulmonary hypertension with RV strain  Recommendations:  Based on her PASP, her degree of dyspnea and RV fxn I agree with referral to IR for catheter directed  lysis. I discussed some of the risks and benefits with the patient and her daughter today. They are agreeable to speak with IR about performing. She will need to move to ICU to undergo. In prep for possible procedure we will hold xarelto and start heparin this pm.    Baltazar Apo, MD, PhD 02/06/2016, 4:28 PM [facility] [phone] or if no answer [phone]

## 2016-02-06 NOTE — Progress Notes (Addendum)
ANTICOAGULATION CONSULT NOTE - Initial Consult  Pharmacy Consult for Heparin  Indication: chest pain/ACS  No Known Allergies  Patient Measurements: Height: 5\' 3"  (160 cm) Weight: 166 lb 9.6 oz (75.6 kg) IBW/kg (Calculated) : 52.4  Vital Signs: Temp: 98.3 F (36.8 C) (11/24 0600) Temp Source: Oral (11/24 0600) BP: 124/85 (11/24 0600) Pulse Rate: 69 (11/24 0600)  Labs:  Recent Labs  02/03/16 1619  02/04/16 0000 02/04/16 0741 02/04/16 1633 02/05/16 0301  HGB 10.7*  < >  --  10.1* 9.7* 9.3*  HCT 34.4*  < >  --  32.7* 31.9* 30.4*  PLT 261  < >  --  234 223 221  APTT 25  --   --   --   --   --   LABPROT 12.5*  --   --   --   --   --   INR 1.2*  --   --   --   --   --   HEPARINUNFRC  --   --   --  0.44  --   --   CREATININE 0.84  < >  --  0.84 0.79 0.79  TROPONINI 0.43*  --  0.29* 0.28* 0.27*  --   < > = values in this interval not displayed. Estimated Creatinine Clearance: 60.1 mL/min (by C-G formula based on SCr of 0.79 mg/dL).  Medical History: Past Medical History:  Diagnosis Date  . Bladder cancer ([place])    a. s/p resection in 06/2015  . Dysuria   . Frequency of urination   . Hematuria   . Hypertension   . Nocturia   . Wears glasses   . Wears partial dentures    upper   Assessment: 75 y/o F to restart heparin for bilateral PE. CT angio positive for acute PE consistent with at least submassive PE.  He was initially transitioned to xarelto, for which he received 3 doses (last dose 0955 this am). Per MD, thrombolysis is being considered. Baseline aPTT and HL ordered. Due to severity of condition, will start heparin sooner than recommended by our protocol, but withhold bolus. CBC stable. No signs/symptoms of bleeding noted.   Goal of Therapy:  Heparin level 0.3-0.7 units/ml Monitor platelets by anticoagulation protocol: Yes  Goal aPTT 66-102s   Plan:  - Start heparin 1200 units/hr beginning at 1600 - Discontinue aPTT orders if heparin level correlative at  baseline - Daily CBC/HL - Monitor for bleeding - F/u thrombolysis plans  Dierdre Harness, Cain Sieve, PharmD Clinical Pharmacy Resident [phone] (Pager) 02/06/2016 1:12 PM

## 2016-02-06 NOTE — Progress Notes (Signed)
Case d/w by eMD with bedside MD Dr Lamonte Sakai - who feels patient meets indication for EKOS. I then relayed this to Dr Catha Brow of IR who will eval patient and get patient in schedule for IR EKOS 02/07/16 . Patient will need to go to ICU after procedure  Dr. Brand Males, M.D., [facility].C.P Pulmonary and Critical Care Medicine Staff Physician [facility] Pager: [phone], If no answer or between  15:00h - 7:00h: call [phone]  02/06/2016 4:15 PM

## 2016-02-06 NOTE — Progress Notes (Addendum)
Patient Name: Catherine Snyder Date of Encounter: 02/06/2016  Primary Cardiologist: Claiborne Billings   Patient Profile     75-year-old woman with a history of hypertension, bladder cancer status post resection as well as a family history of CAD he was seen by cardiology on November 21 for progressively worsening exertional dyspnea simply walking around the house. No associated chest discomfort. She did have some T-wave inversions in V2 and V3 that were new from February 2017. Original plan was for her to have an echocardiogram and undergo outpatient right and left heart catheterization, however for the evaluation they checked BNP level and troponin levels which were elevated. She was therefore brought to the hospital for admission for possible non-STEMI. She was taken to the Cath Lab on November 22 did not show any obstructive coronary disease. What was noted was elevated right ventricular pressures and pulmonary hypertension.  Post Plan to check an echocardiogram and VQ scan. The echocardiogram confirmed elevated right-sided pressures with mild to moderately reduced RV function with mild RV dilation suggestive of pulmonary hypertension. The Cath Lab holding pressures were slightly lower than echocardiographic evaluation which is usually the case. There was however significant pulmonary hypertension noted. VQ scan performed yesterday suggested at least 3 possible wedge-shaped defects that could be consistent with pulmonary emboli of on certain acuity.   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 Did not desat with walking - but she noted only being able to walk to RN station.  Inpatient Medications    Scheduled Meds: .  amLODipine  10 mg Oral Daily  . aspirin  81 mg Oral Daily  . atorvastatin  80 mg Oral q1800  . Rivaroxaban  15 mg Oral BID WC  . sodium chloride flush  3 mL Intravenous Q12H   Continuous Infusions:  PRN Meds: sodium chloride, acetaminophen, nitroGLYCERIN, ondansetron (ZOFRAN) IV, sodium chloride flush, technetium TC 25M diethylenetriame-pentaacetic acid   Vital Signs    Vitals:   02/05/16 2311 02/05/16 2335 02/06/16 0113 02/06/16 0600  BP:    124/85  Pulse: 64 66 70 69  Resp: (!) 27 (!) 26 (!) 28 (!) 22  Temp:    98.3 F (36.8 C)  TempSrc:    Oral  SpO2: 96% 96% 94% 95%  Weight:    166 lb 9.6 oz (75.6 kg)  Height:        Intake/Output Summary (Last 24 hours) at 02/06/16 0903 Last data filed at 02/05/16 2335  Gross per 24 hour  Intake              243 ml  Output                0 ml  Net              243 ml   Filed Weights   02/04/16 0536 02/05/16 0539 02/06/16 0600  Weight: 166 lb 12.8 oz (75.7 kg) 168 lb 4.8 oz (76.3 kg) 166 lb 9.6 oz (75.6 kg)    Physical Exam    GEN: Well nourished, well developed, in no acute distress, but looks fatigued. HEENT: Grossly normal.  Neck: Supple, mildly elevated JVD, carotid bruits, or masses. Cardiac: RRR, no murmurs, rubs, or  gallops. No clubbing, cyanosis, edema.  Radials/DP/PT 2+ and equal bilaterally.  Respiratory:  Respirations regular and unlabored, clear to auscultation bilaterally. GI: Soft, nontender, nondistended, BS + x 4. Neuro:  Strength and sensation are intact. Psych: AAOx3.  Very flat affect.    Labs    CBC  Recent Labs  02/03/16 1619 02/03/16 2046  02/04/16 1633 02/05/16 0301  WBC 5.0 4.6  < > 4.3 5.9  NEUTROABS 2,900 2.8  --   --   --   HGB 10.7* 10.6*  < > 9.7* 9.3*  HCT 34.4* 33.9*  < > 31.9* 30.4*  MCV 70.6* 72.0*  < > 72.3* 72.6*  PLT 261 256  < > 223 221  < > = values in this interval not displayed. Basic Metabolic Panel  Recent Labs  02/04/16 0741 02/04/16 1633 02/05/16 0301  NA 141  --   140  K 3.6  --  3.7  CL 109  --  111  CO2 22  --  21*  GLUCOSE 134*  --  116*  BUN 12  --  10  CREATININE 0.84 0.79 0.79  CALCIUM 8.8*  --  8.7*   Liver Function Tests No results for input(s): AST, ALT, ALKPHOS, BILITOT, PROT, ALBUMIN in the last 72 hours. No results for input(s): LIPASE, AMYLASE in the last 72 hours. Cardiac Enzymes  Recent Labs  02/04/16 0000 02/04/16 0741 02/04/16 1633  TROPONINI 0.29* 0.28* 0.27*   BNP Invalid input(s): POCBNP D-Dimer No results for input(s): DDIMER in the last 72 hours. Hemoglobin A1C No results for input(s): HGBA1C in the last 72 hours. Fasting Lipid Panel No results for input(s): CHOL, HDL, LDLCALC, TRIG, CHOLHDL, LDLDIRECT in the last 72 hours. Thyroid Function Tests  Recent Labs  02/03/16 1619  TSH 1.87    Telemetry    NSR - Personally Reviewed  ECG    Not checked. - Personally Reviewed  Radiology    Nm Pulmonary Perf And Vent  Result Date: 02/04/2016 CLINICAL DATA:  Pulmonary hypertension EXAM: NUCLEAR MEDICINE VENTILATION - PERFUSION LUNG SCAN TECHNIQUE: Ventilation images were obtained in multiple projections using inhaled aerosol Tc-53m DTPA. Perfusion images were obtained in multiple projections after intravenous injection of Tc-35m MAA. RADIOPHARMACEUTICALS:  30.3 mCi Technetium-42m DTPA aerosol inhalation and 4.10 mCi Technetium-43m MAA IV COMPARISON:  Chest x-ray 02/03/2016 FINDINGS: Ventilation: No focal ventilation defect. Perfusion: There are at least 2 moderate wedge-shaped peripheral defects within the superior and posterior upper right lung. Additional small wedge-shaped perfusion defect posterior left upper lobe. There is cardiomegaly. IMPRESSION: Overall intermediate probability V/Q scan. Electronically Signed   By: Donavan Foil M.D.   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 Based on her right heart findings, cannot exclude chronic pulmonary embolism as etiology for her pulmonary hypertension. --> Started treatment dose Xarelto today.  Would still consider continued evaluation for other etiologies for pulmonary attention including OSA and pulmonary fibrosis. -- with her feeling so dyspneic, will order PFTs & consult Pulm Med to be sure we are not missing anything.  I discussed her case with Dr. Haroldine Laws - we reviewed the Echo images -- he recommended Chest CTA (to look for large PE as well as pulmonary architecture).  Also ordered Serology panels for Pulm HTN.  Active Problems:  Elevated troponin/Demand ischemia of myocardium (HCC) - Not NSTEMI; minimal coronary artery disease noted. .  Available data suggested secondary to potentially chronic thromboembolism is the source of the elevated right heart pressures and elevated troponin.      Dyspnea on exertion - most consistent now with prominent pulmonary hypertension with the leading cause potentially being chronic pulmonary emboli.  Okay to continue low-dose Lasix for discharge, but most likely with her low EDP, this is not a primary pulmonary issue.  Concerning how weak & dyspneic she is - order PFTs & pulm consult.    Essential hypertension -- blood pressure looks stable on current medications   Coronary artery disease, non-occlusive - 40% LAD lesion. Would continue risk factor modification - with statin and aspirin  I would like to see her continue to ambulate in the hallways today.  Did not see desat yesterday. This would be in determine if she would benefit from home oxygen.  She is essentially OK for d/c from a Cardiac Standpoint with minimal CAD & mild Troponin elevation, but with her level of dyspnea will broaden work-up & ask for Pulm Med assistance (already discussed with Dr. Haroldine Laws)  She will follow-up as  scheduled with Dr. Claiborne Billings, and would potentially recommend Pulmonary/CHF-Pulmonary Hypertension clinic evaluation to consider potential additional treatment option being on amlodipine   Signed, Glenetta Hew, MD  02/06/2016 10:43 AM   ADDENDUM - called by Radiologist 2/2 large bilateral PEs noted on CTA. He recommended considering IR consult.  Talked with Dr. Geroge Baseman from IR who recommends PCCM involvement .  Will convert to IV Heparin from Xarelto.  PCCM to see - consider possible Directed Lytics by tomorrow if she does not improve.  Glenetta Hew, MD

## 2016-02-07 ENCOUNTER — Encounter (HOSPITAL_COMMUNITY): Payer: Self-pay | Admitting: Interventional Radiology

## 2016-02-07 ENCOUNTER — Inpatient Hospital Stay (HOSPITAL_COMMUNITY): Payer: PPO

## 2016-02-07 DIAGNOSIS — I2692 Saddle embolus of pulmonary artery without acute cor pulmonale: Secondary | ICD-10-CM

## 2016-02-07 DIAGNOSIS — I248 Other forms of acute ischemic heart disease: Secondary | ICD-10-CM | POA: Diagnosis not present

## 2016-02-07 DIAGNOSIS — I2729 Other secondary pulmonary hypertension: Secondary | ICD-10-CM | POA: Diagnosis not present

## 2016-02-07 DIAGNOSIS — I2699 Other pulmonary embolism without acute cor pulmonale: Secondary | ICD-10-CM | POA: Diagnosis not present

## 2016-02-07 DIAGNOSIS — R748 Abnormal levels of other serum enzymes: Secondary | ICD-10-CM | POA: Diagnosis not present

## 2016-02-07 DIAGNOSIS — I2510 Atherosclerotic heart disease of native coronary artery without angina pectoris: Secondary | ICD-10-CM | POA: Diagnosis not present

## 2016-02-07 DIAGNOSIS — I2609 Other pulmonary embolism with acute cor pulmonale: Secondary | ICD-10-CM

## 2016-02-07 HISTORY — PX: IR GENERIC HISTORICAL: IMG1180011

## 2016-02-07 LAB — GLUCOSE, CAPILLARY: GLUCOSE-CAPILLARY: 117 mg/dL — AB (ref 65–99)

## 2016-02-07 LAB — CBC
HCT: 33.6 % — ABNORMAL LOW (ref 36.0–46.0)
HCT: 31.9 % — ABNORMAL LOW (ref 36.0–46.0)
HCT: 28.6 % — ABNORMAL LOW (ref 36.0–46.0)
HEMATOCRIT: 33.1 % — AB (ref 36.0–46.0)
HEMOGLOBIN: 10.3 g/dL — AB (ref 12.0–15.0)
Hemoglobin: 10.1 g/dL — ABNORMAL LOW (ref 12.0–15.0)
Hemoglobin: 9.6 g/dL — ABNORMAL LOW (ref 12.0–15.0)
Hemoglobin: 8.8 g/dL — ABNORMAL LOW (ref 12.0–15.0)
MCH: 22.7 pg — AB (ref 26.0–34.0)
MCH: 22 pg — ABNORMAL LOW (ref 26.0–34.0)
MCH: 22.1 pg — ABNORMAL LOW (ref 26.0–34.0)
MCH: 22.5 pg — ABNORMAL LOW (ref 26.0–34.0)
MCHC: 31.1 g/dL (ref 30.0–36.0)
MCHC: 30.1 g/dL (ref 30.0–36.0)
MCHC: 30.1 g/dL (ref 30.0–36.0)
MCHC: 30.8 g/dL (ref 30.0–36.0)
MCV: 73.1 fL — AB (ref 78.0–100.0)
MCV: 73.2 fL — ABNORMAL LOW (ref 78.0–100.0)
MCV: 73.5 fL — ABNORMAL LOW (ref 78.0–100.0)
MCV: 73.1 fL — ABNORMAL LOW (ref 78.0–100.0)
PLATELETS: 250 10*3/uL (ref 150–400)
Platelets: 277 10*3/uL (ref 150–400)
Platelets: 269 10*3/uL (ref 150–400)
Platelets: 249 K/uL (ref 150–400)
RBC: 4.53 MIL/uL (ref 3.87–5.11)
RBC: 4.59 MIL/uL (ref 3.87–5.11)
RBC: 4.34 MIL/uL (ref 3.87–5.11)
RBC: 3.91 MIL/uL (ref 3.87–5.11)
RDW: 20 % — ABNORMAL HIGH (ref 11.5–15.5)
RDW: 20 % — AB (ref 11.5–15.5)
RDW: 20 % — ABNORMAL HIGH (ref 11.5–15.5)
RDW: 19.9 % — ABNORMAL HIGH (ref 11.5–15.5)
WBC: 5.4 10*3/uL (ref 4.0–10.5)
WBC: 4.2 10*3/uL (ref 4.0–10.5)
WBC: 4.6 10*3/uL (ref 4.0–10.5)
WBC: 4.3 K/uL (ref 4.0–10.5)

## 2016-02-07 LAB — FIBRINOGEN
FIBRINOGEN: 386 mg/dL (ref 210–475)
FIBRINOGEN: 342 mg/dL (ref 210–475)

## 2016-02-07 LAB — MRSA PCR SCREENING: MRSA by PCR: NEGATIVE

## 2016-02-07 LAB — HEPATITIS PANEL, ACUTE
HEP A IGM: NEGATIVE
HEP B C IGM: NEGATIVE
HEP B S AG: NEGATIVE

## 2016-02-07 LAB — ANTI-SCLERODERMA ANTIBODY: Scleroderma (Scl-70) (ENA) Antibody, IgG: <0.2 AI (ref 0.0–0.9)

## 2016-02-07 LAB — MPO/PR-3 (ANCA) ANTIBODIES

## 2016-02-07 LAB — HEPARIN LEVEL (UNFRACTIONATED)
HEPARIN UNFRACTIONATED: 1.25 [IU]/mL — AB (ref 0.30–0.70)
HEPARIN UNFRACTIONATED: 0.76 [IU]/mL — AB (ref 0.30–0.70)
Heparin Unfractionated: 1.48 IU/mL — ABNORMAL HIGH (ref 0.30–0.70)

## 2016-02-07 LAB — APTT
APTT: 92 s — AB (ref 24–36)
aPTT: 113 seconds — ABNORMAL HIGH (ref 24–36)
aPTT: 80 seconds — ABNORMAL HIGH (ref 24–36)

## 2016-02-07 LAB — RHEUMATOID FACTOR: Rhuematoid fact SerPl-aCnc: <10 IU/mL (ref 0.0–13.9)

## 2016-02-07 LAB — HIV ANTIBODY (ROUTINE TESTING W REFLEX): HIV Screen 4th Generation wRfx: NONREACTIVE

## 2016-02-07 MED ORDER — MIDAZOLAM HCL 2 MG/2ML IJ SOLN
INTRAMUSCULAR | Status: AC | PRN
  Administered 2016-02-07: 0.5 mg via INTRAVENOUS

## 2016-02-07 MED ORDER — FENTANYL CITRATE (PF) 100 MCG/2ML IJ SOLN
INTRAMUSCULAR | Status: AC | PRN
  Administered 2016-02-07: 50 ug via INTRAVENOUS

## 2016-02-07 MED ORDER — SODIUM CHLORIDE 0.9% FLUSH: 3.0000 mL | INTRAVENOUS | Status: DC | PRN

## 2016-02-07 MED ORDER — SODIUM CHLORIDE 0.9 % IV SOLN
INTRAVENOUS | Status: DC
  Administered 2016-02-07: 13:00:00 via INTRAVENOUS

## 2016-02-07 MED ORDER — LIDOCAINE HCL 1 % IJ SOLN
INTRAMUSCULAR | Status: AC
  Filled 2016-02-07: qty 20

## 2016-02-07 MED ORDER — SODIUM CHLORIDE 0.9 % IV SOLN
12.0000 mg | Freq: Once | INTRAVENOUS | Status: AC
  Administered 2016-02-07: 12 mg via INTRAVENOUS
  Filled 2016-02-07: qty 12

## 2016-02-07 MED ORDER — FENTANYL CITRATE (PF) 100 MCG/2ML IJ SOLN
INTRAMUSCULAR | Status: AC
  Administered 2016-02-07: 12.5 ug via INTRAVENOUS
  Filled 2016-02-07: qty 2

## 2016-02-07 MED ORDER — IOPAMIDOL (ISOVUE-300) INJECTION 61%
INTRAVENOUS | Status: AC
  Administered 2016-02-07: 20 mL
  Filled 2016-02-07: qty 50

## 2016-02-07 MED ORDER — SODIUM CHLORIDE 0.9 % IV SOLN: 250.0000 mL | INTRAVENOUS | Status: DC | PRN

## 2016-02-07 MED ORDER — MIDAZOLAM HCL 2 MG/2ML IJ SOLN
INTRAMUSCULAR | Status: AC
  Filled 2016-02-07: qty 2

## 2016-02-07 MED ORDER — SODIUM CHLORIDE 0.9 % IV SOLN: INTRAVENOUS | Status: DC

## 2016-02-07 MED ORDER — SODIUM CHLORIDE 0.9% FLUSH
3.0000 mL | Freq: Two times a day (BID) | INTRAVENOUS | Status: DC
  Administered 2016-02-07: 3 mL via INTRAVENOUS

## 2016-02-07 MED ORDER — LIDOCAINE HCL 1 % IJ SOLN
INTRAMUSCULAR | Status: AC | PRN
  Administered 2016-02-07: 10 mL

## 2016-02-07 NOTE — Sedation Documentation (Signed)
Patient denies pain and is resting comfortably.  

## 2016-02-07 NOTE — Progress Notes (Signed)
ANTICOAGULATION CONSULT NOTE  Pharmacy Consult for Heparin  Indication: chest pain/ACS  No Known Allergies  Patient Measurements: Height: 5\' 3"  (160 cm) Weight: 166 lb 9.6 oz (75.6 kg) IBW/kg (Calculated) : 52.4  Vital Signs: Temp: 98.4 F (36.9 C) (11/24 1942) Temp Source: Oral (11/24 1942) BP: 114/72 (11/24 1942) Pulse Rate: 69 (11/24 2100)  Labs:  Recent Labs  02/04/16 0741 02/04/16 1633 02/05/16 0301 02/06/16 1312 02/06/16 2345  HGB 10.1* 9.7* 9.3*  --   --   HCT 32.7* 31.9* 30.4*  --   --   PLT 234 223 221  --   --   APTT  --   --   --  37* 113*  HEPARINUNFRC 0.44  --   --  >2.20*  --   CREATININE 0.84 0.79 0.79  --   --   TROPONINI 0.28* 0.27*  --   --   --    Estimated Creatinine Clearance: 60.1 mL/min (by C-G formula based on SCr of 0.79 mg/dL).  Assessment: 75 y.o. female with PE for heparin.   Goal of Therapy:  Heparin level 0.3-0.7 units/ml Monitor platelets by anticoagulation protocol: Yes  Goal aPTT 66-102s   Plan:  Decrease Heparin 1050 units/hr APTT in 8 hrs F/U plan for thrombolysis  Phillis Knack, PharmD, BCPS  02/07/2016 12:46 AM

## 2016-02-07 NOTE — Progress Notes (Signed)
Pt arrived on the unit. Alert, oriented, denies pail. IR team at the bedside connecting EKO system

## 2016-02-07 NOTE — Progress Notes (Signed)
Interventional Radiology Progress Note  Called to pt bedside for bleeding around sheath.  Arrived at pt bedside, PCCM physician holding pressure and bleeding under control.  Significant blood on bedsheets, estimate 1 unit lost.   Site evaluated, there is brisk bleeding from around the more lateral sheath site.  Bleeding easily controlled with manual pressure.  STAT CBC, Type and Cross and fibrinogen sent.   tPA is 98% completed (only 30 minutes infusion remaining).  EKOS catheters removed and discarded.   Vital signs initially stable but pt then became hypotensive to 68/48.  1.8 L fluid bolus administered through 14F sheaths followed by 1 unit PRBCs.  Pressure responded very quickly, pt remained alert.  Following fluid administration, the more lateral sheath was removed.  Pressure held for 40 minutes with successful hemostasis.  Site re-prepped with chlorhexadine and a sterile transparent bandage was applied.  Care and plan discussed with patient and family.  They are aware and at bedside.   1.) Monitor closely for signs of continued bleeding 2.) Trend H&H transfuse additional if needed.    Signed,  Criselda Peaches, MD

## 2016-02-07 NOTE — Progress Notes (Signed)
Name: Catherine Snyder MRN: [id number] DOB: 1940-08-10    ADMISSION DATE:  02/03/2016 CONSULTATION DATE:  02/06/16   REFERRING MD :  Dr Ellyn Hack, Cardiology  REASON FOR CONSULTATION:  Acute pulmonary embolism with right heart strain  BRIEF PATIENT DESCRIPTION: 75 year old woman with hypertension and treated bladder cancer, admitted for evaluation of severe dyspnea and found to have acute bilateral PE with right heart strain. Being considered for possible catheter directed lysis  SIGNIFICANT EVENTS  11/21 admitted for acute dyspnea, concern for possible CAD  STUDIES:  11/22 left and right catheterization >> nonobstructive coronary artery disease, pulmonary hypertension with estimated PA systolic in the [id number] >> V/q intermediate prob with wedge shaped defects,  11/24 CT-PA >> confirmed B large PE w evidence r heart strain   HISTORY OF PRESENT ILLNESS:  75 y.o.femalewith past medical history of HTN, bladder CA (s/p resection in 06/2015), and family history of CAD who presented to ED 11/21 with DOE and hypoxemia beginning 6 days PTA. Underwent L and R cath given suspicion for CAD. L cath was reassuring, R showed RV strain and PAH. V/q was intermediate prob, wedge shaped perfusion defects. CT-PA confirmed B large PE. PCCM consulted regarding suitability for catheter directed lytics.    SUBJECTIVE:  Significantly less SOB. Comfortable laying flat.  On going EKOS lytic therapy.   VITAL SIGNS: Temp:  [98.4 F (36.9 C)-98.8 F (37.1 C)] 98.8 F (37.1 C) (11/25 1257) Pulse Rate:  [57-70] 63 (11/25 1212) Resp:  [18-27] 22 (11/25 1212) BP: (105-149)/(72-85) 132/85 (11/25 1212) SpO2:  [90 %-97 %] 91 % (11/25 1212) Weight:  [74.8 kg (165 lb)] 74.8 kg (165 lb) (11/25 0534)  PHYSICAL EXAMINATION: General:  Pleasant overwt woman, NAD.  Neuro:  Awake and appropriate, non-focal HEENT:  Op clear, no lesions Cardiovascular:  Regular, 2/6 sys M Lungs:  Distant, decreased at bases.    Abdomen:  benign   Recent Labs Lab 02/03/16 2046 02/04/16 0741 02/04/16 1633 02/05/16 0301  NA 139 141  --  140  K 3.2* 3.6  --  3.7  CL 107 109  --  111  CO2 21* 22  --  21*  BUN 16 12  --  10  CREATININE 1.00 0.84 0.79 0.79  GLUCOSE 130* 134*  --  116*    Recent Labs Lab 02/05/16 0301 02/07/16 0725 02/07/16 1305  HGB 9.3* 10.3* 10.1*  HCT 30.4* 33.1* 33.6*  WBC 5.9 5.4 4.2  PLT 221 277 269   Ct Angio Chest Pe W Or Wo Contrast  Result Date: 02/06/2016 CLINICAL DATA:  Shortness of Breath, of positive V/Q scan EXAM: CT ANGIOGRAPHY CHEST WITH CONTRAST TECHNIQUE: Multidetector CT imaging of the chest was performed using the standard protocol during bolus administration of intravenous contrast. Multiplanar CT image reconstructions and MIPs were obtained to evaluate the vascular anatomy. CONTRAST:  80 mL Isovue 370. COMPARISON:  02/04/2016 FINDINGS: Cardiovascular: Thoracic aorta shows diffuse atherosclerotic calcifications without aneurysmal dilatation. Pulmonary artery is well visualized and demonstrates significant bilateral pulmonary emboli. There is dilatation of the right ventricle with a an RV/LV ratio of 1.8 consistent with right heart strain. Coronary calcifications are noted. Mediastinum/Nodes: Thoracic inlet is within normal limits. No significant hilar or mediastinal adenopathy is noted. No axillary adenopathy is seen. Lungs/Pleura: Lungs are well aerated bilaterally without focal confluent infiltrate or sizable effusion. No findings to suggest pulmonary infarct are noted. No pneumothorax is seen. Upper Abdomen: Within normal limits. Musculoskeletal: Within normal limits. Review of the MIP  images confirms the above findings. IMPRESSION: Positive for acute PE with CT evidence of right heart strain (RV/LV Ratio = 1.8) consistent with at least submassive (intermediate risk) PE. The presence of right heart strain has been associated with an increased risk of morbidity and  mortality. Please activate Code PE by paging [phone]. Critical Value/emergent results were called by telephone at the time of interpretation on 02/06/2016 at 12:30 pm to Dr. Ellyn Hack, who verbally acknowledged these results. Electronically Signed   By: Inez Catalina M.D.   On: 02/06/2016 12:31   Ir Angiogram Pulmonary Bilateral Selective  Result Date: 02/07/2016 INDICATION: 75 year old female with sub massive pulmonary embolus and acute cor pulmonale. EXAM: IR INFUSION THROMBOL ARTERIAL INITIAL (MS); ADDITIONAL ARTERIOGRAPHY; IR ULTRASOUND GUIDANCE VASC ACCESS RIGHT; BILATERAL PULMONARY ARTERIOGRAPHY COMPARISON:  Chest CTA 02/06/2016 MEDICATIONS: None. ANESTHESIA/SEDATION: Versed 0.5 mg IV; Fentanyl 50 mcg IV Moderate Sedation Time:  53 minutes The patient was continuously monitored during the procedure by the interventional radiology nurse under my direct supervision. FLUOROSCOPY TIME:  Fluoroscopy Time: 6 minutes 18 seconds (66 mGy). COMPLICATIONS: None immediate. TECHNIQUE: Informed written consent was obtained from the patient after a thorough discussion of the procedural risks, benefits and alternatives. All questions were addressed. Maximal Sterile Barrier Technique was utilized including caps, mask, sterile gowns, sterile gloves, sterile drape, hand hygiene and skin antiseptic. A timeout was performed prior to the initiation of the procedure. The right common femoral vein was interrogated with ultrasound. An image was obtained and stored for the medical record. Local anesthesia was attained by infiltration with 1% lidocaine. A small dermatotomy was made. Under real-time sonographic guidance, the vessel was punctured with a 21 gauge micropuncture needle. Using standard technique, the initial micro needle was exchanged over a 0.018 micro wire for a transitional 4 [place] micro sheath. The micro sheath was then exchanged over a 0.035 wire for a working 6 [place] vascular sheath. Using the same technique, a  second ultrasound-guided puncture of the right common femoral vein was made slightly medial to the first. The micro needle was then exchanged over the same steps for a second working 6 [place] vascular sheath. There are now 2 separate sheaths in the right common femoral vein. Starting with more lateral sheath, a C2 cobra catheter was advanced over a a Bentson wire in used to navigate through the heart and into the left main pulmonary artery. A left main pulmonary arteriogram was performed. Multiple filling defects are identified consistent with multifocal PG. The catheter was then navigated into the left lower lobe pulmonary artery. Repeat arteriography was performed confirming the anatomic position. A Rosen wire was advanced into the left lower lobe pulmonary artery through the catheter. Attention was turned to the more medial 6 French sheath. Again, a C2 cobra catheter was advanced over a Bentson wire into the heart and navigated through the heart and into the right main pulmonary artery. A right main pulmonary arteriogram was performed. There is significant outflow obstruction with a large clot at the bifurcation into the upper and lower lobe pulmonary arteries. Pressures were measured. The pulmonary arterial pressure was 53/29 for a mean of 30 mm Hg. The catheter was advanced into the right lower lobe pulmonary artery. Contrast injection was performed under fluoroscopy to confirm the anatomic position. A Rosen wire was advanced. Two EKOS 12 cm infusion length ultrasound assisted infusion catheters were then selected in advanced over the wires and positioned in the right and left lower lobe pulmonary arteries. Bilateral pulmonary arterial I cysts  was then initiated at a rate of 0.5 milligrams/hour per catheter for a total rate of 1.0 milligrams/hour. The catheters were secured in place. The patient tolerated the procedure well. FINDINGS: 1. Multifocal bilateral pulmonary emboli. 2. Mean main pulmonary artery  pressure 30 mm Hg consistent with pulmonary arterial hypertension. IMPRESSION: Successful initiation of bilateral pulmonary artery ultrasound accelerated catheter directed thrombolysis. Signed, Criselda Peaches, MD [facility] Radiology Electronically Signed   By: Jacqulynn Cadet M.D.   On: 02/07/2016 14:50   Ir Angiogram Selective Each Additional Vessel  Result Date: 02/07/2016 INDICATION: 75 year old female with sub massive pulmonary embolus and acute cor pulmonale. EXAM: IR INFUSION THROMBOL ARTERIAL INITIAL (MS); ADDITIONAL ARTERIOGRAPHY; IR ULTRASOUND GUIDANCE VASC ACCESS RIGHT; BILATERAL PULMONARY ARTERIOGRAPHY COMPARISON:  Chest CTA 02/06/2016 MEDICATIONS: None. ANESTHESIA/SEDATION: Versed 0.5 mg IV; Fentanyl 50 mcg IV Moderate Sedation Time:  53 minutes The patient was continuously monitored during the procedure by the interventional radiology nurse under my direct supervision. FLUOROSCOPY TIME:  Fluoroscopy Time: 6 minutes 18 seconds (66 mGy). COMPLICATIONS: None immediate. TECHNIQUE: Informed written consent was obtained from the patient after a thorough discussion of the procedural risks, benefits and alternatives. All questions were addressed. Maximal Sterile Barrier Technique was utilized including caps, mask, sterile gowns, sterile gloves, sterile drape, hand hygiene and skin antiseptic. A timeout was performed prior to the initiation of the procedure. The right common femoral vein was interrogated with ultrasound. An image was obtained and stored for the medical record. Local anesthesia was attained by infiltration with 1% lidocaine. A small dermatotomy was made. Under real-time sonographic guidance, the vessel was punctured with a 21 gauge micropuncture needle. Using standard technique, the initial micro needle was exchanged over a 0.018 micro wire for a transitional 4 [place] micro sheath. The micro sheath was then exchanged over a 0.035 wire  for a working 6 [place] vascular sheath. Using the same technique, a second ultrasound-guided puncture of the right common femoral vein was made slightly medial to the first. The micro needle was then exchanged over the same steps for a second working 6 [place] vascular sheath. There are now 2 separate sheaths in the right common femoral vein. Starting with more lateral sheath, a C2 cobra catheter was advanced over a a Bentson wire in used to navigate through the heart and into the left main pulmonary artery. A left main pulmonary arteriogram was performed. Multiple filling defects are identified consistent with multifocal PG. The catheter was then navigated into the left lower lobe pulmonary artery. Repeat arteriography was performed confirming the anatomic position. A Rosen wire was advanced into the left lower lobe pulmonary artery through the catheter. Attention was turned to the more medial 6 French sheath. Again, a C2 cobra catheter was advanced over a Bentson wire into the heart and navigated through the heart and into the right main pulmonary artery. A right main pulmonary arteriogram was performed. There is significant outflow obstruction with a large clot at the bifurcation into the upper and lower lobe pulmonary arteries. Pressures were measured. The pulmonary arterial pressure was 53/29 for a mean of 30 mm Hg. The catheter was advanced into the right lower lobe pulmonary artery. Contrast injection was performed under fluoroscopy to confirm the anatomic position. A Rosen wire was advanced. Two EKOS 12 cm infusion length ultrasound assisted infusion catheters were then selected in advanced over the wires and positioned in the right and left lower lobe pulmonary arteries. Bilateral pulmonary arterial I cysts was then initiated  at a rate of 0.5 milligrams/hour per catheter for a total rate of 1.0 milligrams/hour. The catheters were secured in place. The patient tolerated the procedure well. FINDINGS: 1.  Multifocal bilateral pulmonary emboli. 2. Mean main pulmonary artery pressure 30 mm Hg consistent with pulmonary arterial hypertension. IMPRESSION: Successful initiation of bilateral pulmonary artery ultrasound accelerated catheter directed thrombolysis. Signed, Criselda Peaches, MD [facility] Radiology Electronically Signed   By: Jacqulynn Cadet M.D.   On: 02/07/2016 14:50   Ir Angiogram Selective Each Additional Vessel  Result Date: 02/07/2016 INDICATION: 75 year old female with sub massive pulmonary embolus and acute cor pulmonale. EXAM: IR INFUSION THROMBOL ARTERIAL INITIAL (MS); ADDITIONAL ARTERIOGRAPHY; IR ULTRASOUND GUIDANCE VASC ACCESS RIGHT; BILATERAL PULMONARY ARTERIOGRAPHY COMPARISON:  Chest CTA 02/06/2016 MEDICATIONS: None. ANESTHESIA/SEDATION: Versed 0.5 mg IV; Fentanyl 50 mcg IV Moderate Sedation Time:  53 minutes The patient was continuously monitored during the procedure by the interventional radiology nurse under my direct supervision. FLUOROSCOPY TIME:  Fluoroscopy Time: 6 minutes 18 seconds (66 mGy). COMPLICATIONS: None immediate. TECHNIQUE: Informed written consent was obtained from the patient after a thorough discussion of the procedural risks, benefits and alternatives. All questions were addressed. Maximal Sterile Barrier Technique was utilized including caps, mask, sterile gowns, sterile gloves, sterile drape, hand hygiene and skin antiseptic. A timeout was performed prior to the initiation of the procedure. The right common femoral vein was interrogated with ultrasound. An image was obtained and stored for the medical record. Local anesthesia was attained by infiltration with 1% lidocaine. A small dermatotomy was made. Under real-time sonographic guidance, the vessel was punctured with a 21 gauge micropuncture needle. Using standard technique, the initial micro needle was exchanged over a 0.018 micro wire for a transitional 4  [place] micro sheath. The micro sheath was then exchanged over a 0.035 wire for a working 6 [place] vascular sheath. Using the same technique, a second ultrasound-guided puncture of the right common femoral vein was made slightly medial to the first. The micro needle was then exchanged over the same steps for a second working 6 [place] vascular sheath. There are now 2 separate sheaths in the right common femoral vein. Starting with more lateral sheath, a C2 cobra catheter was advanced over a a Bentson wire in used to navigate through the heart and into the left main pulmonary artery. A left main pulmonary arteriogram was performed. Multiple filling defects are identified consistent with multifocal PG. The catheter was then navigated into the left lower lobe pulmonary artery. Repeat arteriography was performed confirming the anatomic position. A Rosen wire was advanced into the left lower lobe pulmonary artery through the catheter. Attention was turned to the more medial 6 French sheath. Again, a C2 cobra catheter was advanced over a Bentson wire into the heart and navigated through the heart and into the right main pulmonary artery. A right main pulmonary arteriogram was performed. There is significant outflow obstruction with a large clot at the bifurcation into the upper and lower lobe pulmonary arteries. Pressures were measured. The pulmonary arterial pressure was 53/29 for a mean of 30 mm Hg. The catheter was advanced into the right lower lobe pulmonary artery. Contrast injection was performed under fluoroscopy to confirm the anatomic position. A Rosen wire was advanced. Two EKOS 12 cm infusion length ultrasound assisted infusion catheters were then selected in advanced over the wires and positioned in the right and left lower lobe pulmonary arteries. Bilateral pulmonary arterial I cysts was then initiated at a rate  of 0.5 milligrams/hour per catheter for a total rate of 1.0 milligrams/hour. The catheters were  secured in place. The patient tolerated the procedure well. FINDINGS: 1. Multifocal bilateral pulmonary emboli. 2. Mean main pulmonary artery pressure 30 mm Hg consistent with pulmonary arterial hypertension. IMPRESSION: Successful initiation of bilateral pulmonary artery ultrasound accelerated catheter directed thrombolysis. Signed, Criselda Peaches, MD [facility] Radiology Electronically Signed   By: Jacqulynn Cadet M.D.   On: 02/07/2016 14:50   Ir US Guide Vasc Access Right  Result Date: 02/07/2016 INDICATION: 75 year old female with sub massive pulmonary embolus and acute cor pulmonale. EXAM: IR INFUSION THROMBOL ARTERIAL INITIAL (MS); ADDITIONAL ARTERIOGRAPHY; IR ULTRASOUND GUIDANCE VASC ACCESS RIGHT; BILATERAL PULMONARY ARTERIOGRAPHY COMPARISON:  Chest CTA 02/06/2016 MEDICATIONS: None. ANESTHESIA/SEDATION: Versed 0.5 mg IV; Fentanyl 50 mcg IV Moderate Sedation Time:  53 minutes The patient was continuously monitored during the procedure by the interventional radiology nurse under my direct supervision. FLUOROSCOPY TIME:  Fluoroscopy Time: 6 minutes 18 seconds (66 mGy). COMPLICATIONS: None immediate. TECHNIQUE: Informed written consent was obtained from the patient after a thorough discussion of the procedural risks, benefits and alternatives. All questions were addressed. Maximal Sterile Barrier Technique was utilized including caps, mask, sterile gowns, sterile gloves, sterile drape, hand hygiene and skin antiseptic. A timeout was performed prior to the initiation of the procedure. The right common femoral vein was interrogated with ultrasound. An image was obtained and stored for the medical record. Local anesthesia was attained by infiltration with 1% lidocaine. A small dermatotomy was made. Under real-time sonographic guidance, the vessel was punctured with a 21 gauge micropuncture needle. Using standard technique, the initial micro needle was  exchanged over a 0.018 micro wire for a transitional 4 [place] micro sheath. The micro sheath was then exchanged over a 0.035 wire for a working 6 [place] vascular sheath. Using the same technique, a second ultrasound-guided puncture of the right common femoral vein was made slightly medial to the first. The micro needle was then exchanged over the same steps for a second working 6 [place] vascular sheath. There are now 2 separate sheaths in the right common femoral vein. Starting with more lateral sheath, a C2 cobra catheter was advanced over a a Bentson wire in used to navigate through the heart and into the left main pulmonary artery. A left main pulmonary arteriogram was performed. Multiple filling defects are identified consistent with multifocal PG. The catheter was then navigated into the left lower lobe pulmonary artery. Repeat arteriography was performed confirming the anatomic position. A Rosen wire was advanced into the left lower lobe pulmonary artery through the catheter. Attention was turned to the more medial 6 French sheath. Again, a C2 cobra catheter was advanced over a Bentson wire into the heart and navigated through the heart and into the right main pulmonary artery. A right main pulmonary arteriogram was performed. There is significant outflow obstruction with a large clot at the bifurcation into the upper and lower lobe pulmonary arteries. Pressures were measured. The pulmonary arterial pressure was 53/29 for a mean of 30 mm Hg. The catheter was advanced into the right lower lobe pulmonary artery. Contrast injection was performed under fluoroscopy to confirm the anatomic position. A Rosen wire was advanced. Two EKOS 12 cm infusion length ultrasound assisted infusion catheters were then selected in advanced over the wires and positioned in the right and left lower lobe pulmonary arteries. Bilateral pulmonary arterial I cysts was then initiated at a rate of 0.5 milligrams/hour  per catheter for a  total rate of 1.0 milligrams/hour. The catheters were secured in place. The patient tolerated the procedure well. FINDINGS: 1. Multifocal bilateral pulmonary emboli. 2. Mean main pulmonary artery pressure 30 mm Hg consistent with pulmonary arterial hypertension. IMPRESSION: Successful initiation of bilateral pulmonary artery ultrasound accelerated catheter directed thrombolysis. Signed, Criselda Peaches, MD [facility] Radiology Electronically Signed   By: Jacqulynn Cadet M.D.   On: 02/07/2016 14:50   Ir Infusion Thrombol Arterial Initial (ms)  Result Date: 02/07/2016 INDICATION: 75 year old female with sub massive pulmonary embolus and acute cor pulmonale. EXAM: IR INFUSION THROMBOL ARTERIAL INITIAL (MS); ADDITIONAL ARTERIOGRAPHY; IR ULTRASOUND GUIDANCE VASC ACCESS RIGHT; BILATERAL PULMONARY ARTERIOGRAPHY COMPARISON:  Chest CTA 02/06/2016 MEDICATIONS: None. ANESTHESIA/SEDATION: Versed 0.5 mg IV; Fentanyl 50 mcg IV Moderate Sedation Time:  53 minutes The patient was continuously monitored during the procedure by the interventional radiology nurse under my direct supervision. FLUOROSCOPY TIME:  Fluoroscopy Time: 6 minutes 18 seconds (66 mGy). COMPLICATIONS: None immediate. TECHNIQUE: Informed written consent was obtained from the patient after a thorough discussion of the procedural risks, benefits and alternatives. All questions were addressed. Maximal Sterile Barrier Technique was utilized including caps, mask, sterile gowns, sterile gloves, sterile drape, hand hygiene and skin antiseptic. A timeout was performed prior to the initiation of the procedure. The right common femoral vein was interrogated with ultrasound. An image was obtained and stored for the medical record. Local anesthesia was attained by infiltration with 1% lidocaine. A small dermatotomy was made. Under real-time sonographic guidance, the vessel was punctured with a 21 gauge micropuncture  needle. Using standard technique, the initial micro needle was exchanged over a 0.018 micro wire for a transitional 4 [place] micro sheath. The micro sheath was then exchanged over a 0.035 wire for a working 6 [place] vascular sheath. Using the same technique, a second ultrasound-guided puncture of the right common femoral vein was made slightly medial to the first. The micro needle was then exchanged over the same steps for a second working 6 [place] vascular sheath. There are now 2 separate sheaths in the right common femoral vein. Starting with more lateral sheath, a C2 cobra catheter was advanced over a a Bentson wire in used to navigate through the heart and into the left main pulmonary artery. A left main pulmonary arteriogram was performed. Multiple filling defects are identified consistent with multifocal PG. The catheter was then navigated into the left lower lobe pulmonary artery. Repeat arteriography was performed confirming the anatomic position. A Rosen wire was advanced into the left lower lobe pulmonary artery through the catheter. Attention was turned to the more medial 6 [place] sheath. Again, a C2 cobra catheter was advanced over a Bentson wire into the heart and navigated through the heart and into the right main pulmonary artery. A right main pulmonary arteriogram was performed. There is significant outflow obstruction with a large clot at the bifurcation into the upper and lower lobe pulmonary arteries. Pressures were measured. The pulmonary arterial pressure was 53/29 for a mean of 30 mm Hg. The catheter was advanced into the right lower lobe pulmonary artery. Contrast injection was performed under fluoroscopy to confirm the anatomic position. A Rosen wire was advanced. Two EKOS 12 cm infusion length ultrasound assisted infusion catheters were then selected in advanced over the wires and positioned in the right and left lower lobe pulmonary arteries. Bilateral pulmonary arterial I cysts was then  initiated at a rate of 0.5 milligrams/hour per catheter  for a total rate of 1.0 milligrams/hour. The catheters were secured in place. The patient tolerated the procedure well. FINDINGS: 1. Multifocal bilateral pulmonary emboli. 2. Mean main pulmonary artery pressure 30 mm Hg consistent with pulmonary arterial hypertension. IMPRESSION: Successful initiation of bilateral pulmonary artery ultrasound accelerated catheter directed thrombolysis. Signed, Criselda Peaches, MD [facility] Radiology Electronically Signed   By: Jacqulynn Cadet M.D.   On: 02/07/2016 14:50   Ir Infusion Thrombol Arterial Initial (ms)  Result Date: 02/07/2016 INDICATION: 75 year old female with sub massive pulmonary embolus and acute cor pulmonale. EXAM: IR INFUSION THROMBOL ARTERIAL INITIAL (MS); ADDITIONAL ARTERIOGRAPHY; IR ULTRASOUND GUIDANCE VASC ACCESS RIGHT; BILATERAL PULMONARY ARTERIOGRAPHY COMPARISON:  Chest CTA 02/06/2016 MEDICATIONS: None. ANESTHESIA/SEDATION: Versed 0.5 mg IV; Fentanyl 50 mcg IV Moderate Sedation Time:  53 minutes The patient was continuously monitored during the procedure by the interventional radiology nurse under my direct supervision. FLUOROSCOPY TIME:  Fluoroscopy Time: 6 minutes 18 seconds (66 mGy). COMPLICATIONS: None immediate. TECHNIQUE: Informed written consent was obtained from the patient after a thorough discussion of the procedural risks, benefits and alternatives. All questions were addressed. Maximal Sterile Barrier Technique was utilized including caps, mask, sterile gowns, sterile gloves, sterile drape, hand hygiene and skin antiseptic. A timeout was performed prior to the initiation of the procedure. The right common femoral vein was interrogated with ultrasound. An image was obtained and stored for the medical record. Local anesthesia was attained by infiltration with 1% lidocaine. A small dermatotomy was made. Under real-time sonographic  guidance, the vessel was punctured with a 21 gauge micropuncture needle. Using standard technique, the initial micro needle was exchanged over a 0.018 micro wire for a transitional 4 [place] micro sheath. The micro sheath was then exchanged over a 0.035 wire for a working 6 [place] vascular sheath. Using the same technique, a second ultrasound-guided puncture of the right common femoral vein was made slightly medial to the first. The micro needle was then exchanged over the same steps for a second working 6 [place] vascular sheath. There are now 2 separate sheaths in the right common femoral vein. Starting with more lateral sheath, a C2 cobra catheter was advanced over a a Bentson wire in used to navigate through the heart and into the left main pulmonary artery. A left main pulmonary arteriogram was performed. Multiple filling defects are identified consistent with multifocal PG. The catheter was then navigated into the left lower lobe pulmonary artery. Repeat arteriography was performed confirming the anatomic position. A Rosen wire was advanced into the left lower lobe pulmonary artery through the catheter. Attention was turned to the more medial 6 French sheath. Again, a C2 cobra catheter was advanced over a Bentson wire into the heart and navigated through the heart and into the right main pulmonary artery. A right main pulmonary arteriogram was performed. There is significant outflow obstruction with a large clot at the bifurcation into the upper and lower lobe pulmonary arteries. Pressures were measured. The pulmonary arterial pressure was 53/29 for a mean of 30 mm Hg. The catheter was advanced into the right lower lobe pulmonary artery. Contrast injection was performed under fluoroscopy to confirm the anatomic position. A Rosen wire was advanced. Two EKOS 12 cm infusion length ultrasound assisted infusion catheters were then selected in advanced over the wires and positioned in the right and left lower lobe  pulmonary arteries. Bilateral pulmonary arterial I cysts was then initiated at a rate of 0.5 milligrams/hour per catheter for a total  rate of 1.0 milligrams/hour. The catheters were secured in place. The patient tolerated the procedure well. FINDINGS: 1. Multifocal bilateral pulmonary emboli. 2. Mean main pulmonary artery pressure 30 mm Hg consistent with pulmonary arterial hypertension. IMPRESSION: Successful initiation of bilateral pulmonary artery ultrasound accelerated catheter directed thrombolysis. Signed, Criselda Peaches, MD [facility] Radiology Electronically Signed   By: Jacqulynn Cadet M.D.   On: 02/07/2016 14:50    ASSESSMENT  Acute bilateral pulmonary emboli. 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 Ongoing EKOS lytic.  Acute respiratory failure with hypoxemia. Clinically improved. On [place] now.  Secondary pulmonary hypertension with RV strain HTN  Plan : Keep in ICU while getting EKOS.  NPO for now as she is laying flat.  EKOS protocol Cont IVF.  Check CBGs while NPO for EKOS (laying flat) Potential transfer in am after EKOS.  Pt is DNR  J. Shirl Harris, MD 02/07/2016, 3:45 PM  Pulmonary and Critical Care Pager [phone] After 3 pm or if no answer, call [phone]

## 2016-02-07 NOTE — Progress Notes (Signed)
ANTICOAGULATION CONSULT NOTE - Follow Up Consult  Pharmacy Consult for Heparin  Indication: chest pain/ACS  No Known Allergies  Patient Measurements: Height: 5\' 3"  (160 cm) Weight: 165 lb (74.8 kg) IBW/kg (Calculated) : 52.4  Vital Signs: Temp: 98.6 F (37 C) (11/25 0534) Temp Source: Oral (11/25 0534) BP: 129/85 (11/25 0534) Pulse Rate: 70 (11/25 0534)  Labs:  Recent Labs  02/04/16 1633 02/05/16 0301 02/06/16 1312 02/06/16 2345 02/07/16 0725 02/07/16 0904  HGB 9.7* 9.3*  --   --  10.3*  --   HCT 31.9* 30.4*  --   --  33.1*  --   PLT 223 221  --   --  277  --   APTT  --   --  37* 113*  --  80*  HEPARINUNFRC  --   --  >2.20*  --  1.48*  --   CREATININE 0.79 0.79  --   --   --   --   TROPONINI 0.27*  --   --   --   --   --    Estimated Creatinine Clearance: 59.8 mL/min (by C-G formula based on SCr of 0.79 mg/dL).  Medical History: Past Medical History:  Diagnosis Date  . Bladder cancer ([place])    a. s/p resection in 06/2015  . Dysuria   . Frequency of urination   . Hematuria   . Hypertension   . Nocturia   . Wears glasses   . Wears partial dentures    upper   Assessment: 75 y/o F on heparin for bilateral PE. CT angio positive for acute PE consistent with at least submassive PE.  He was initially transitioned to xarelto, for which he received 3 doses (last dose 0955 on 11/24). EKOS planned for today. HL this am 1.48, aPTT 80 sec (within goal), still not correlative. Hgb stable. PLTC wnl. No s/sx of bleeding noted. Continue at current rate and check confirmatory aPTT.   Goal of Therapy:  Heparin level 0.3-0.7 units/ml Monitor platelets by anticoagulation protocol: Yes  Goal aPTT 66-102s   Plan:  - Continue heparin at 1050 units/hr - 8 hr aPTT - Goal aPTT is 66-102s - Daily CBC, heparin level, aPTT - F/u EKOS plans today.  Dierdre Harness, Cain Sieve, PharmD Clinical Pharmacy Resident [phone] (Pager) 02/07/2016 10:10 AM

## 2016-02-07 NOTE — Progress Notes (Signed)
ANTICOAGULATION CONSULT NOTE - Follow Up Consult  Pharmacy Consult for Heparin  Indication: chest pain/ACS  No Known Allergies  Patient Measurements: Height: 5\' 3"  (160 cm) Weight: 165 lb (74.8 kg) IBW/kg (Calculated) : 52.4  Vital Signs: Temp: 99 F (37.2 C) (11/25 1921) Temp Source: Oral (11/25 1921) BP: 98/81 (11/25 1845) Pulse Rate: 70 (11/25 1845)  Labs:  Recent Labs  02/05/16 0301  02/06/16 2345 02/07/16 0725 02/07/16 0904 02/07/16 1305 02/07/16 1824  HGB 9.3*  --   --  10.3*  --  10.1* 9.6*  HCT 30.4*  --   --  33.1*  --  33.6* 31.9*  PLT 221  --   --  277  --  269 250  APTT  --   < > 113*  --  80*  --  92*  HEPARINUNFRC  --   < >  --  1.48*  --  1.25* 0.76*  CREATININE 0.79  --   --   --   --   --   --   < > = values in this interval not displayed. Estimated Creatinine Clearance: 59.8 mL/min (by C-G formula based on SCr of 0.79 mg/dL).  Medical History: Past Medical History:  Diagnosis Date  . Bladder cancer ([place])    a. s/p resection in 06/2015  . Dysuria   . Frequency of urination   . Hematuria   . Hypertension   . Nocturia   . Wears glasses   . Wears partial dentures    upper   Assessment: 75 y/o F on heparin for bilateral PE. CT angio positive for acute PE consistent with at least submassive PE.  He was initially transitioned to xarelto, for which he received 3 doses (last dose 0955 on 11/24). She is now undergoing catheter directed thrombolysis for 12 hours - aPTT= 92, HL= 0.76   Goal of Therapy:  Heparin level= 0.3-0.5 Monitor platelets by anticoagulation protocol: Yes  Goal aPTT 66-84   Plan:  -Decrease heparin to 950 units/hr - Daily CBC, heparin level, aPTT  Hildred Laser, Pharm D 02/07/2016 7:33 PM

## 2016-02-07 NOTE — Procedures (Signed)
Interventional Radiology Procedure Note  Procedure:  1.) Bilateral pulmonary angiogram 2.) Main mean pulm artery pressure 30 mmHg 3.) Initiation bilateral pulmonary artery [place] accelerated catheter directed thrombolysis   Vascular Access: Right CFV - 104F x2  Complications: None  Estimated Blood Loss: 0  Recommendations: - [place] next 12 hrs - Will check in am  Signed,  Criselda Peaches, MD

## 2016-02-07 NOTE — Progress Notes (Signed)
Patient Name: Catherine Snyder Date of Encounter: 02/07/2016  Primary Cardiologist: Dr. 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  Inpatient Medications    Scheduled Meds: . alteplase (tPA/ ACTIVASE) PE Lysis 12 mg/250 mL (BILATERAL)  12 mg Intravenous Once  . alteplase (tPA/ ACTIVASE) PE Lysis 12 mg/250 mL (BILATERAL)  12 mg Intravenous Once  . amLODipine  10 mg Oral Daily  . aspirin  81 mg Oral Daily  . atorvastatin  80 mg Oral q1800  . sodium chloride flush  3 mL Intravenous Q12H  . sodium chloride flush  3 mL Intravenous Q12H   Continuous Infusions: . sodium chloride 35 mL/hr at 02/07/16 1245  . sodium chloride 35 mL/hr at 02/07/16 1245  . sodium chloride    . sodium chloride    . heparin 1,050 Units/hr (02/07/16 1241)   PRN Meds: sodium chloride, sodium chloride, acetaminophen, nitroGLYCERIN, ondansetron (ZOFRAN) IV, sodium chloride flush, sodium chloride flush, technetium TC 22M diethylenetriame-pentaacetic acid   Vital Signs    Vitals:   02/07/16 1150 02/07/16 1155 02/07/16 1212 02/07/16 1257  BP: 105/72 129/80 132/85   Pulse: 68 (!) 59 63   Resp: (!) 21 (!) 21 (!) 22   Temp:    98.8 F (37.1 C)  TempSrc:    Oral  SpO2: 90% 90% 91%   Weight:      Height:        Intake/Output Summary (Last 24 hours) at 02/07/16 1423 Last data filed at 02/07/16 0904  Gross per 24 hour  Intake           109.46 ml  Output                0 ml  Net           109.46 ml   Filed Weights   02/05/16 0539 02/06/16 0600 02/07/16 0534  Weight: 168 lb 4.8 oz (76.3 kg) 166 lb 9.6 oz (75.6 kg) 165 lb (74.8 kg)    Physical Exam   Gen.: Appears comfortable. HEENT:  Conjunctiva and lids normal, oropharynx clear. Neck: Supple, no elevated JVP or carotid bruits, no thyromegaly. Lungs: Clear to auscultation, nonlabored breathing at rest. Cardiac: Regular rate and rhythm, no S3, no pericardial rub. Abdomen: Soft, nontender, bowel sounds present, no guarding or rebound. Extremities: No pitting edema, distal pulses 2+.  Labs    CBC  Recent Labs  02/07/16 0725 02/07/16 1305  WBC 5.4 4.2  HGB 10.3* 10.1*  HCT 33.1* 33.6*  MCV 73.1* 73.2*  PLT 277 [id number]   Basic Metabolic Panel  Recent Labs  02/04/16 1633 02/05/16 0301  NA  --  140  K  --  3.7  CL  --  111  CO2  --  21*  GLUCOSE  --  116*  BUN  --  10  CREATININE 0.79 0.79  CALCIUM  --  8.7*   Cardiac Enzymes  Recent Labs  02/04/16 1633  TROPONINI 0.27*   Telemetry    I personally reviewed telemetry monitoring which shows sinus rhythm.  ECG    I personally reviewed the tracing from 02/04/2016 which shows sinus rhythm with R  in lead V1 and V2, nonspecific ST-T changes.   Radiology    Ct Angio Chest Pe W Or Wo Contrast  Result Date: 02/06/2016 CLINICAL DATA:  Shortness of Breath, of positive V/Q scan EXAM: CT ANGIOGRAPHY CHEST WITH CONTRAST TECHNIQUE: Multidetector CT imaging of the chest was performed using the standard protocol during bolus administration of intravenous contrast. Multiplanar CT image reconstructions and MIPs were obtained to evaluate the vascular anatomy. CONTRAST:  80 mL Isovue 370. COMPARISON:  02/04/2016 FINDINGS: Cardiovascular: Thoracic aorta shows diffuse atherosclerotic calcifications without aneurysmal dilatation. Pulmonary artery is well visualized and demonstrates significant bilateral pulmonary emboli. There is dilatation of the right ventricle with a an RV/LV ratio of 1.8 consistent with right heart strain. Coronary calcifications are noted. Mediastinum/Nodes: Thoracic inlet is within normal limits. No significant hilar or mediastinal adenopathy is noted.  No axillary adenopathy is seen. Lungs/Pleura: Lungs are well aerated bilaterally without focal confluent infiltrate or sizable effusion. No findings to suggest pulmonary infarct are noted. No pneumothorax is seen. Upper Abdomen: Within normal limits. Musculoskeletal: Within normal limits. Review of the MIP images confirms the above findings. IMPRESSION: Positive for acute PE with CT evidence of right heart strain (RV/LV Ratio = 1.8) consistent with at least submassive (intermediate risk) PE. The presence of right heart strain has been associated with an increased risk of morbidity and mortality. Please activate Code PE by paging [phone]. Critical Value/emergent results were called by telephone at the time of interpretation on 02/06/2016 at 12:30 pm to Dr. Ellyn Hack, who verbally acknowledged these results. Electronically Signed   By: Inez Catalina M.D.   On: 02/06/2016 12:31    Cardiac Studies   Echocardiogram 02/04/2016: Study Conclusions  - Left ventricle: The cavity size was normal. Wall thickness was   increased in a pattern of moderate LVH. Systolic function was   normal. The estimated ejection fraction was in the range of 55%   to 60%. Wall motion was normal; there were no regional wall   motion abnormalities. Doppler parameters are consistent with   abnormal left ventricular relaxation (grade 1 diastolic   dysfunction). - Aortic valve: Moderately calcified annulus. Moderately thickened,   moderately calcified leaflets. There was mild regurgitation. - Right ventricle: The cavity size was mildly dilated. Systolic   function was mildly to moderately reduced. - Right atrium: The atrium was moderately dilated. - Tricuspid valve: There was mild-moderate regurgitation. - Pulmonary arteries: Systolic pressure was moderately to severely   increased. PA peak pressure: 67 mm Hg (S).  Patient Profile     75 year old woman with hypertension and nonobstructive CAD by recent cardiac  catheterization, subsequent diagnosed with large bilateral pulmonary emboli associated with pulmonary hypertension and RV dysfunction. Patient seen by critical care and now undergoing catheter directed thrombolysis with observation in the ICU.  Assessment & Plan    1. Submassive bilateral pulmonary emboli, now undergoing catheter directed thrombolysis. Echocardiogram demonstrated RV dysfunction with PASP 67 mmHg.  2. Nonobstructive CAD based on cardiac catheterization from November 22.  3. Elevated troponin I, likely secondary to problem #1.  4. Essential hypertension.  Continue ICU observation with catheter directed thrombolysis. Otherwise he noted a medically stable from a cardiac perspective. She continues on ASA, Norvasc and Lipitor.  Signed, Satira Sark, M.D., F.A.C.C.  02/07/2016, 2:23 PM

## 2016-02-07 NOTE — Progress Notes (Signed)
Bladder scan was done. 990 ml retaining. Foley placed per standing order. Pt tolerated well. MD aware

## 2016-02-08 ENCOUNTER — Inpatient Hospital Stay (HOSPITAL_COMMUNITY): Payer: PPO

## 2016-02-08 DIAGNOSIS — I2729 Other secondary pulmonary hypertension: Secondary | ICD-10-CM | POA: Diagnosis not present

## 2016-02-08 DIAGNOSIS — R748 Abnormal levels of other serum enzymes: Secondary | ICD-10-CM | POA: Diagnosis not present

## 2016-02-08 DIAGNOSIS — I2699 Other pulmonary embolism without acute cor pulmonale: Secondary | ICD-10-CM | POA: Diagnosis not present

## 2016-02-08 DIAGNOSIS — I2609 Other pulmonary embolism with acute cor pulmonale: Secondary | ICD-10-CM | POA: Diagnosis not present

## 2016-02-08 DIAGNOSIS — I517 Cardiomegaly: Secondary | ICD-10-CM | POA: Diagnosis not present

## 2016-02-08 DIAGNOSIS — I2692 Saddle embolus of pulmonary artery without acute cor pulmonale: Secondary | ICD-10-CM | POA: Diagnosis not present

## 2016-02-08 DIAGNOSIS — I2510 Atherosclerotic heart disease of native coronary artery without angina pectoris: Secondary | ICD-10-CM | POA: Diagnosis not present

## 2016-02-08 DIAGNOSIS — I248 Other forms of acute ischemic heart disease: Secondary | ICD-10-CM | POA: Diagnosis not present

## 2016-02-08 LAB — FIBRINOGEN
FIBRINOGEN: 294 mg/dL (ref 210–475)
Fibrinogen: 313 mg/dL (ref 210–475)
Fibrinogen: 299 mg/dL (ref 210–475)
Fibrinogen: 333 mg/dL (ref 210–475)

## 2016-02-08 LAB — CBC
HEMATOCRIT: 27.6 % — AB (ref 36.0–46.0)
HEMATOCRIT: 28.1 % — AB (ref 36.0–46.0)
HEMATOCRIT: 29.7 % — AB (ref 36.0–46.0)
HEMOGLOBIN: 8.5 g/dL — AB (ref 12.0–15.0)
HEMOGLOBIN: 8.7 g/dL — AB (ref 12.0–15.0)
HEMOGLOBIN: 9.2 g/dL — AB (ref 12.0–15.0)
MCH: 23.2 pg — ABNORMAL LOW (ref 26.0–34.0)
MCH: 23.4 pg — AB (ref 26.0–34.0)
MCH: 23.5 pg — ABNORMAL LOW (ref 26.0–34.0)
MCHC: 30.8 g/dL (ref 30.0–36.0)
MCHC: 31 g/dL (ref 30.0–36.0)
MCHC: 31 g/dL (ref 30.0–36.0)
MCV: 75.4 fL — ABNORMAL LOW (ref 78.0–100.0)
MCV: 75.5 fL — AB (ref 78.0–100.0)
MCV: 75.8 fL — AB (ref 78.0–100.0)
PLATELETS: 194 10*3/uL (ref 150–400)
Platelets: 204 10*3/uL (ref 150–400)
Platelets: 213 10*3/uL (ref 150–400)
RBC: 3.66 MIL/uL — AB (ref 3.87–5.11)
RBC: 3.72 MIL/uL — AB (ref 3.87–5.11)
RBC: 3.92 MIL/uL (ref 3.87–5.11)
RDW: 20.9 % — ABNORMAL HIGH (ref 11.5–15.5)
RDW: 20.4 % — ABNORMAL HIGH (ref 11.5–15.5)
RDW: 20.7 % — AB (ref 11.5–15.5)
WBC: 7 10*3/uL (ref 4.0–10.5)
WBC: 6.5 10*3/uL (ref 4.0–10.5)
WBC: 6.3 10*3/uL (ref 4.0–10.5)

## 2016-02-08 LAB — APTT
APTT: 74 s — AB (ref 24–36)
aPTT: 28 seconds (ref 24–36)

## 2016-02-08 LAB — BASIC METABOLIC PANEL
Anion gap: 6 (ref 5–15)
BUN: 5 mg/dL — ABNORMAL LOW (ref 6–20)
CHLORIDE: 110 mmol/L (ref 101–111)
CO2: 23 mmol/L (ref 22–32)
Calcium: 8 mg/dL — ABNORMAL LOW (ref 8.9–10.3)
Creatinine, Ser: 0.66 mg/dL (ref 0.44–1.00)
Glucose, Bld: 123 mg/dL — ABNORMAL HIGH (ref 65–99)
POTASSIUM: 3.3 mmol/L — AB (ref 3.5–5.1)
SODIUM: 139 mmol/L (ref 135–145)

## 2016-02-08 LAB — HEPARIN LEVEL (UNFRACTIONATED)
HEPARIN UNFRACTIONATED: 0.59 [IU]/mL (ref 0.30–0.70)
HEPARIN UNFRACTIONATED: 0.31 [IU]/mL (ref 0.30–0.70)

## 2016-02-08 LAB — ABO/RH: ABO/RH(D): AB POS

## 2016-02-08 MED ORDER — SODIUM CHLORIDE 0.9 % IV SOLN
INTRAVENOUS | Status: DC
  Administered 2016-02-09: 70 mL/h via INTRAVENOUS

## 2016-02-08 MED ORDER — SODIUM CHLORIDE 0.9 % IV BOLUS (SEPSIS)
800.0000 mL | Freq: Once | INTRAVENOUS | Status: AC
  Administered 2016-02-07: 800 mL via INTRAVENOUS

## 2016-02-08 MED ORDER — SODIUM CHLORIDE 0.9 % IV BOLUS (SEPSIS)
1000.0000 mL | Freq: Once | INTRAVENOUS | Status: AC
  Administered 2016-02-07: 1000 mL via INTRAVENOUS

## 2016-02-08 MED ORDER — POTASSIUM CHLORIDE CRYS ER 20 MEQ PO TBCR
40.0000 meq | EXTENDED_RELEASE_TABLET | Freq: Once | ORAL | Status: AC
  Administered 2016-02-08: 40 meq via ORAL
  Filled 2016-02-08: qty 2

## 2016-02-08 MED ORDER — HEPARIN (PORCINE) IN NACL 100-0.45 UNIT/ML-% IJ SOLN
1000.0000 [IU]/h | INTRAMUSCULAR | Status: DC
  Administered 2016-02-08: 950 [IU]/h via INTRAVENOUS
  Administered 2016-02-09 – 2016-02-10 (×2): 1000 [IU]/h via INTRAVENOUS
  Filled 2016-02-08 (×4): qty 250

## 2016-02-08 MED ORDER — FENTANYL CITRATE (PF) 100 MCG/2ML IJ SOLN
12.5000 ug | Freq: Once | INTRAMUSCULAR | Status: AC
  Administered 2016-02-07: 12.5 ug via INTRAVENOUS
  Filled 2016-02-08: qty 2

## 2016-02-08 NOTE — Progress Notes (Signed)
ANTICOAGULATION CONSULT NOTE - Follow Up Consult  Pharmacy Consult for Heparin  Indication: chest pain/ACS  No Known Allergies  Patient Measurements: Height: 5\' 3"  (160 cm) Weight: 165 lb (74.8 kg) IBW/kg (Calculated) : 52.4  Vital Signs: Temp: 98.3 F (36.8 C) (11/26 1600) Temp Source: Oral (11/26 1600) BP: 104/66 (11/26 1800) Pulse Rate: 76 (11/26 1800)  Labs:  Recent Labs  02/07/16 1824 02/07/16 2319 02/08/16 0038 02/08/16 0420 02/08/16 0500 02/08/16 1222 02/08/16 1819  HGB 9.6* 8.8*  --   --  8.5* 8.7* 9.2*  HCT 31.9* 28.6*  --   --  27.6* 28.1* 29.7*  PLT 250 249  --   --  204 194 213  APTT 92*  --   --   --  28  --  74*  HEPARINUNFRC 0.76* 0.59  --  <0.10*  --   --  0.31  CREATININE  --   --  0.66  --   --   --   --    Estimated Creatinine Clearance: 59.8 mL/min (by C-G formula based on SCr of 0.66 mg/dL).  Medical History: Past Medical History:  Diagnosis Date  . Bladder cancer ([place])    a. s/p resection in 06/2015  . Dysuria   . Frequency of urination   . Hematuria   . Hypertension   . Nocturia   . Wears glasses   . Wears partial dentures    upper   Assessment: 75 y/o F on heparin for bilateral PE. CT angio positive for acute PE consistent with at least submassive PE.  He was initially transitioned to xarelto, for which he received 3 doses (last dose 0955 on 11/24).   Patient's heparin was stopped secondary to massive bleeding from EKOS sheath site. All catheters have been removed and will restart heparin per Dr. Laurence Ferrari at previous dose.  Initial aPTT and HL are therapeutic and look to be correlating. Nurse reports no further issues with bleeding. Will increase slightly to maintain therapeutic level and use heparin levels going forward.  Goal of Therapy:  Heparin level 0.3-0.7 Monitor platelets by anticoagulation protocol: Yes    Plan:  Increase heparin to 1000 units/hr 6h HL Daily HL/CBC Monitor s/sx of bleeding F/u oral anticoagulant  plan  Andrey Cota. Diona Foley, PharmD, [place] Clinical Pharmacist Pager [phone] 02/08/2016 7:00 PM

## 2016-02-08 NOTE — Progress Notes (Signed)
Patient Name: Catherine Snyder Date of Encounter: 02/08/2016  Primary Cardiologist: Dr. Lorie Phenix Problem List     Principal Problem:   Pulmonary hypertension due to thromboembolism [facility]) Active Problems:   Essential hypertension   Demand ischemia of myocardium (HCC) - Not NSTEMI   Elevated troponin   Dyspnea on exertion   Coronary artery disease, non-occlusive   Acute saddle pulmonary embolism without acute cor pulmonale (HCC)    Subjective   No complaints today. Events of the evening are noted.  Inpatient Medications    Scheduled Meds: . amLODipine  10 mg Oral Daily  . aspirin  81 mg Oral Daily  . atorvastatin  80 mg Oral q1800  . sodium chloride flush  3 mL Intravenous Q12H  . sodium chloride flush  3 mL Intravenous Q12H   Continuous Infusions: . sodium chloride 35 mL/hr at 02/07/16 1245  . sodium chloride 35 mL/hr at 02/07/16 1245  . sodium chloride    . sodium chloride    . sodium chloride     PRN Meds: sodium chloride, sodium chloride, acetaminophen, nitroGLYCERIN, ondansetron (ZOFRAN) IV, sodium chloride flush, sodium chloride flush, technetium TC 12M diethylenetriame-pentaacetic acid   Vital Signs    Vitals:   02/08/16 0915 02/08/16 0930 02/08/16 0945 02/08/16 1000  BP:    126/77  Pulse: 66 66 64 64  Resp: 20 (!) 21 (!) 23 (!) 21  Temp:      TempSrc:      SpO2: 100% 100% 100% 100%  Weight:      Height:        Intake/Output Summary (Last 24 hours) at 02/08/16 1122 Last data filed at 02/08/16 1000  Gross per 24 hour  Intake           3355.5 ml  Output             2625 ml  Net            730.5 ml   Filed Weights   02/05/16 0539 02/06/16 0600 02/07/16 0534  Weight: 168 lb 4.8 oz (76.3 kg) 166 lb 9.6 oz (75.6 kg) 165 lb (74.8 kg)    Physical Exam   Gen.: Appears comfortable. HEENT: Conjunctiva and lids normal, oropharynx clear. Neck: Supple, no elevated JVP or carotid bruits, no thyromegaly. Lungs: Clear to auscultation,  nonlabored breathing at rest. Cardiac: Regular rate and rhythm, no S3, no pericardial rub. Abdomen: Soft, nontender, bowel sounds present, no guarding or rebound. Extremities: No pitting edema, distal pulses 2+.  Labs    CBC  Recent Labs  02/07/16 2319 02/08/16 0500  WBC 4.3 7.0  HGB 8.8* 8.5*  HCT 28.6* 27.6*  MCV 73.1* 75.4*  PLT 249 0000000   Basic Metabolic Panel  Recent Labs  02/08/16 0038  NA 139  K 3.3*  CL 110  CO2 23  GLUCOSE 123*  BUN 5*  CREATININE 0.66  CALCIUM 8.0*    Telemetry    I personally reviewed telemetry monitoring which shows sinus rhythm.  ECG    I personally reviewed the tracing from 02/04/2016 which shows sinus rhythm with R in lead V1 and V2, nonspecific ST-T changes.   Radiology    Ct Angio Chest Pe W Or Wo Contrast  Result Date: 02/06/2016 CLINICAL DATA:  Shortness of Breath, of positive V/Q scan EXAM: CT ANGIOGRAPHY CHEST WITH CONTRAST TECHNIQUE: Multidetector CT imaging of the chest was performed using the standard protocol during bolus administration of intravenous contrast. Multiplanar CT  image reconstructions and MIPs were obtained to evaluate the vascular anatomy. CONTRAST:  80 mL Isovue 370. COMPARISON:  02/04/2016 FINDINGS: Cardiovascular: Thoracic aorta shows diffuse atherosclerotic calcifications without aneurysmal dilatation. Pulmonary artery is well visualized and demonstrates significant bilateral pulmonary emboli. There is dilatation of the right ventricle with a an RV/LV ratio of 1.8 consistent with right heart strain. Coronary calcifications are noted. Mediastinum/Nodes: Thoracic inlet is within normal limits. No significant hilar or mediastinal adenopathy is noted. No axillary adenopathy is seen. Lungs/Pleura: Lungs are well aerated bilaterally without focal confluent infiltrate or sizable effusion. No findings to suggest pulmonary infarct are noted. No pneumothorax is seen. Upper Abdomen: Within normal limits. Musculoskeletal:  Within normal limits. Review of the MIP images confirms the above findings. IMPRESSION: Positive for acute PE with CT evidence of right heart strain (RV/LV Ratio = 1.8) consistent with at least submassive (intermediate risk) PE. The presence of right heart strain has been associated with an increased risk of morbidity and mortality. Please activate Code PE by paging [phone]. Critical Value/emergent results were called by telephone at the time of interpretation on 02/06/2016 at 12:30 pm to Dr. Ellyn Hack, who verbally acknowledged these results. Electronically Signed   By: Inez Catalina M.D.   On: 02/06/2016 12:31   Ir Angiogram Pulmonary Bilateral Selective  Result Date: 02/07/2016 INDICATION: 75 year old female with sub massive pulmonary embolus and acute cor pulmonale. EXAM: IR INFUSION THROMBOL ARTERIAL INITIAL (MS); ADDITIONAL ARTERIOGRAPHY; IR ULTRASOUND GUIDANCE VASC ACCESS RIGHT; BILATERAL PULMONARY ARTERIOGRAPHY COMPARISON:  Chest CTA 02/06/2016 MEDICATIONS: None. ANESTHESIA/SEDATION: Versed 0.5 mg IV; Fentanyl 50 mcg IV Moderate Sedation Time:  53 minutes The patient was continuously monitored during the procedure by the interventional radiology nurse under my direct supervision. FLUOROSCOPY TIME:  Fluoroscopy Time: 6 minutes 18 seconds (66 mGy). COMPLICATIONS: None immediate. TECHNIQUE: Informed written consent was obtained from the patient after a thorough discussion of the procedural risks, benefits and alternatives. All questions were addressed. Maximal Sterile Barrier Technique was utilized including caps, mask, sterile gowns, sterile gloves, sterile drape, hand hygiene and skin antiseptic. A timeout was performed prior to the initiation of the procedure. The right common femoral vein was interrogated with ultrasound. An image was obtained and stored for the medical record. Local anesthesia was attained by infiltration with 1% lidocaine. A small dermatotomy was made. Under real-time sonographic  guidance, the vessel was punctured with a 21 gauge micropuncture needle. Using standard technique, the initial micro needle was exchanged over a 0.018 micro wire for a transitional 4 [place] micro sheath. The micro sheath was then exchanged over a 0.035 wire for a working 6 [place] vascular sheath. Using the same technique, a second ultrasound-guided puncture of the right common femoral vein was made slightly medial to the first. The micro needle was then exchanged over the same steps for a second working 6 [place] vascular sheath. There are now 2 separate sheaths in the right common femoral vein. Starting with more lateral sheath, a C2 cobra catheter was advanced over a a Bentson wire in used to navigate through the heart and into the left main pulmonary artery. A left main pulmonary arteriogram was performed. Multiple filling defects are identified consistent with multifocal PG. The catheter was then navigated into the left lower lobe pulmonary artery. Repeat arteriography was performed confirming the anatomic position. A Rosen wire was advanced into the left lower lobe pulmonary artery through the catheter. Attention was turned to the more medial 6 French sheath. Again, a C2 cobra catheter was advanced  over a Bentson wire into the heart and navigated through the heart and into the right main pulmonary artery. A right main pulmonary arteriogram was performed. There is significant outflow obstruction with a large clot at the bifurcation into the upper and lower lobe pulmonary arteries. Pressures were measured. The pulmonary arterial pressure was 53/29 for a mean of 30 mm Hg. The catheter was advanced into the right lower lobe pulmonary artery. Contrast injection was performed under fluoroscopy to confirm the anatomic position. A Rosen wire was advanced. Two EKOS 12 cm infusion length ultrasound assisted infusion catheters were then selected in advanced over the wires and positioned in the right and left lower lobe  pulmonary arteries. Bilateral pulmonary arterial I cysts was then initiated at a rate of 0.5 milligrams/hour per catheter for a total rate of 1.0 milligrams/hour. The catheters were secured in place. The patient tolerated the procedure well. FINDINGS: 1. Multifocal bilateral pulmonary emboli. 2. Mean main pulmonary artery pressure 30 mm Hg consistent with pulmonary arterial hypertension. IMPRESSION: Successful initiation of bilateral pulmonary artery ultrasound accelerated catheter directed thrombolysis. Signed, Criselda Peaches, MD [facility] Radiology Electronically Signed   By: Jacqulynn Cadet M.D.   On: 02/07/2016 14:50   Ir Angiogram Selective Each Additional Vessel  Result Date: 02/07/2016 INDICATION: 75 year old female with sub massive pulmonary embolus and acute cor pulmonale. EXAM: IR INFUSION THROMBOL ARTERIAL INITIAL (MS); ADDITIONAL ARTERIOGRAPHY; IR ULTRASOUND GUIDANCE VASC ACCESS RIGHT; BILATERAL PULMONARY ARTERIOGRAPHY COMPARISON:  Chest CTA 02/06/2016 MEDICATIONS: None. ANESTHESIA/SEDATION: Versed 0.5 mg IV; Fentanyl 50 mcg IV Moderate Sedation Time:  53 minutes The patient was continuously monitored during the procedure by the interventional radiology nurse under my direct supervision. FLUOROSCOPY TIME:  Fluoroscopy Time: 6 minutes 18 seconds (66 mGy). COMPLICATIONS: None immediate. TECHNIQUE: Informed written consent was obtained from the patient after a thorough discussion of the procedural risks, benefits and alternatives. All questions were addressed. Maximal Sterile Barrier Technique was utilized including caps, mask, sterile gowns, sterile gloves, sterile drape, hand hygiene and skin antiseptic. A timeout was performed prior to the initiation of the procedure. The right common femoral vein was interrogated with ultrasound. An image was obtained and stored for the medical record. Local anesthesia was attained by infiltration with 1%  lidocaine. A small dermatotomy was made. Under real-time sonographic guidance, the vessel was punctured with a 21 gauge micropuncture needle. Using standard technique, the initial micro needle was exchanged over a 0.018 micro wire for a transitional 4 [place] micro sheath. The micro sheath was then exchanged over a 0.035 wire for a working 6 [place] vascular sheath. Using the same technique, a second ultrasound-guided puncture of the right common femoral vein was made slightly medial to the first. The micro needle was then exchanged over the same steps for a second working 6 [place] vascular sheath. There are now 2 separate sheaths in the right common femoral vein. Starting with more lateral sheath, a C2 cobra catheter was advanced over a a Bentson wire in used to navigate through the heart and into the left main pulmonary artery. A left main pulmonary arteriogram was performed. Multiple filling defects are identified consistent with multifocal PG. The catheter was then navigated into the left lower lobe pulmonary artery. Repeat arteriography was performed confirming the anatomic position. A Rosen wire was advanced into the left lower lobe pulmonary artery through the catheter. Attention was turned to the more medial 6 French sheath. Again, a C2 cobra catheter was advanced over a [place]  wire into the heart and navigated through the heart and into the right main pulmonary artery. A right main pulmonary arteriogram was performed. There is significant outflow obstruction with a large clot at the bifurcation into the upper and lower lobe pulmonary arteries. Pressures were measured. The pulmonary arterial pressure was 53/29 for a mean of 30 mm Hg. The catheter was advanced into the right lower lobe pulmonary artery. Contrast injection was performed under fluoroscopy to confirm the anatomic position. A Rosen wire was advanced. Two EKOS 12 cm infusion length ultrasound assisted infusion catheters were then selected in  advanced over the wires and positioned in the right and left lower lobe pulmonary arteries. Bilateral pulmonary arterial I cysts was then initiated at a rate of 0.5 milligrams/hour per catheter for a total rate of 1.0 milligrams/hour. The catheters were secured in place. The patient tolerated the procedure well. FINDINGS: 1. Multifocal bilateral pulmonary emboli. 2. Mean main pulmonary artery pressure 30 mm Hg consistent with pulmonary arterial hypertension. IMPRESSION: Successful initiation of bilateral pulmonary artery ultrasound accelerated catheter directed thrombolysis. Signed, Criselda Peaches, MD [facility] Radiology Electronically Signed   By: Jacqulynn Cadet M.D.   On: 02/07/2016 14:50   Ir Angiogram Selective Each Additional Vessel  Result Date: 02/07/2016 INDICATION: 75 year old female with sub massive pulmonary embolus and acute cor pulmonale. EXAM: IR INFUSION THROMBOL ARTERIAL INITIAL (MS); ADDITIONAL ARTERIOGRAPHY; IR ULTRASOUND GUIDANCE VASC ACCESS RIGHT; BILATERAL PULMONARY ARTERIOGRAPHY COMPARISON:  Chest CTA 02/06/2016 MEDICATIONS: None. ANESTHESIA/SEDATION: Versed 0.5 mg IV; Fentanyl 50 mcg IV Moderate Sedation Time:  53 minutes The patient was continuously monitored during the procedure by the interventional radiology nurse under my direct supervision. FLUOROSCOPY TIME:  Fluoroscopy Time: 6 minutes 18 seconds (66 mGy). COMPLICATIONS: None immediate. TECHNIQUE: Informed written consent was obtained from the patient after a thorough discussion of the procedural risks, benefits and alternatives. All questions were addressed. Maximal Sterile Barrier Technique was utilized including caps, mask, sterile gowns, sterile gloves, sterile drape, hand hygiene and skin antiseptic. A timeout was performed prior to the initiation of the procedure. The right common femoral vein was interrogated with ultrasound. An image was obtained and stored for the  medical record. Local anesthesia was attained by infiltration with 1% lidocaine. A small dermatotomy was made. Under real-time sonographic guidance, the vessel was punctured with a 21 gauge micropuncture needle. Using standard technique, the initial micro needle was exchanged over a 0.018 micro wire for a transitional 4 [place] micro sheath. The micro sheath was then exchanged over a 0.035 wire for a working 6 [place] vascular sheath. Using the same technique, a second ultrasound-guided puncture of the right common femoral vein was made slightly medial to the first. The micro needle was then exchanged over the same steps for a second working 6 [place] vascular sheath. There are now 2 separate sheaths in the right common femoral vein. Starting with more lateral sheath, a C2 cobra catheter was advanced over a a Bentson wire in used to navigate through the heart and into the left main pulmonary artery. A left main pulmonary arteriogram was performed. Multiple filling defects are identified consistent with multifocal PG. The catheter was then navigated into the left lower lobe pulmonary artery. Repeat arteriography was performed confirming the anatomic position. A Rosen wire was advanced into the left lower lobe pulmonary artery through the catheter. Attention was turned to the more medial 6 French sheath. Again, a C2 cobra catheter was advanced over a Bentson wire into the  heart and navigated through the heart and into the right main pulmonary artery. A right main pulmonary arteriogram was performed. There is significant outflow obstruction with a large clot at the bifurcation into the upper and lower lobe pulmonary arteries. Pressures were measured. The pulmonary arterial pressure was 53/29 for a mean of 30 mm Hg. The catheter was advanced into the right lower lobe pulmonary artery. Contrast injection was performed under fluoroscopy to confirm the anatomic position. A Rosen wire was advanced. Two EKOS 12 cm infusion  length ultrasound assisted infusion catheters were then selected in advanced over the wires and positioned in the right and left lower lobe pulmonary arteries. Bilateral pulmonary arterial I cysts was then initiated at a rate of 0.5 milligrams/hour per catheter for a total rate of 1.0 milligrams/hour. The catheters were secured in place. The patient tolerated the procedure well. FINDINGS: 1. Multifocal bilateral pulmonary emboli. 2. Mean main pulmonary artery pressure 30 mm Hg consistent with pulmonary arterial hypertension. IMPRESSION: Successful initiation of bilateral pulmonary artery ultrasound accelerated catheter directed thrombolysis. Signed, Criselda Peaches, MD [facility] Radiology Electronically Signed   By: Jacqulynn Cadet M.D.   On: 02/07/2016 14:50   Ir US Guide Vasc Access Right  Result Date: 02/07/2016 INDICATION: 75 year old female with sub massive pulmonary embolus and acute cor pulmonale. EXAM: IR INFUSION THROMBOL ARTERIAL INITIAL (MS); ADDITIONAL ARTERIOGRAPHY; IR ULTRASOUND GUIDANCE VASC ACCESS RIGHT; BILATERAL PULMONARY ARTERIOGRAPHY COMPARISON:  Chest CTA 02/06/2016 MEDICATIONS: None. ANESTHESIA/SEDATION: Versed 0.5 mg IV; Fentanyl 50 mcg IV Moderate Sedation Time:  53 minutes The patient was continuously monitored during the procedure by the interventional radiology nurse under my direct supervision. FLUOROSCOPY TIME:  Fluoroscopy Time: 6 minutes 18 seconds (66 mGy). COMPLICATIONS: None immediate. TECHNIQUE: Informed written consent was obtained from the patient after a thorough discussion of the procedural risks, benefits and alternatives. All questions were addressed. Maximal Sterile Barrier Technique was utilized including caps, mask, sterile gowns, sterile gloves, sterile drape, hand hygiene and skin antiseptic. A timeout was performed prior to the initiation of the procedure. The right common femoral vein was interrogated with  ultrasound. An image was obtained and stored for the medical record. Local anesthesia was attained by infiltration with 1% lidocaine. A small dermatotomy was made. Under real-time sonographic guidance, the vessel was punctured with a 21 gauge micropuncture needle. Using standard technique, the initial micro needle was exchanged over a 0.018 micro wire for a transitional 4 [place] micro sheath. The micro sheath was then exchanged over a 0.035 wire for a working 6 [place] vascular sheath. Using the same technique, a second ultrasound-guided puncture of the right common femoral vein was made slightly medial to the first. The micro needle was then exchanged over the same steps for a second working 6 [place] vascular sheath. There are now 2 separate sheaths in the right common femoral vein. Starting with more lateral sheath, a C2 cobra catheter was advanced over a a Bentson wire in used to navigate through the heart and into the left main pulmonary artery. A left main pulmonary arteriogram was performed. Multiple filling defects are identified consistent with multifocal PG. The catheter was then navigated into the left lower lobe pulmonary artery. Repeat arteriography was performed confirming the anatomic position. A Rosen wire was advanced into the left lower lobe pulmonary artery through the catheter. Attention was turned to the more medial 6 French sheath. Again, a C2 cobra catheter was advanced over a Bentson wire into the heart and navigated  through the heart and into the right main pulmonary artery. A right main pulmonary arteriogram was performed. There is significant outflow obstruction with a large clot at the bifurcation into the upper and lower lobe pulmonary arteries. Pressures were measured. The pulmonary arterial pressure was 53/29 for a mean of 30 mm Hg. The catheter was advanced into the right lower lobe pulmonary artery. Contrast injection was performed under fluoroscopy to confirm the anatomic position. A  Rosen wire was advanced. Two EKOS 12 cm infusion length ultrasound assisted infusion catheters were then selected in advanced over the wires and positioned in the right and left lower lobe pulmonary arteries. Bilateral pulmonary arterial I cysts was then initiated at a rate of 0.5 milligrams/hour per catheter for a total rate of 1.0 milligrams/hour. The catheters were secured in place. The patient tolerated the procedure well. FINDINGS: 1. Multifocal bilateral pulmonary emboli. 2. Mean main pulmonary artery pressure 30 mm Hg consistent with pulmonary arterial hypertension. IMPRESSION: Successful initiation of bilateral pulmonary artery ultrasound accelerated catheter directed thrombolysis. Signed, Criselda Peaches, MD [facility] Radiology Electronically Signed   By: Jacqulynn Cadet M.D.   On: 02/07/2016 14:50   Dg Chest Port 1 View  Result Date: 02/08/2016 CLINICAL DATA:  75 year old female with sub massive pulmonary embolus status post bilateral pulmonary artery ultrasound accelerated catheter directed thrombolysis yesterday. Initial encounter. EXAM: PORTABLE CHEST 1 VIEW COMPARISON:  Chest CTA 02/06/2016. Chest radiographs 02/03/2016 and earlier. FINDINGS: Portable AP supine view at 0434 hours. Stable cardiomegaly and mediastinal contours. Relatively stable lung volumes compared to 02/03/2016. Allowing for portable technique the lungs are clear. No pneumothorax or pleural effusion. Visualized tracheal air column is within normal limits. IMPRESSION: Stable cardiomegaly and no acute cardiopulmonary abnormality following pulmonary artery catheter directed thrombolysis. Electronically Signed   By: Genevie Ann M.D.   On: 02/08/2016 06:36   Ir Infusion Thrombol Arterial Initial (ms)  Result Date: 02/07/2016 INDICATION: 75 year old female with sub massive pulmonary embolus and acute cor pulmonale. EXAM: IR INFUSION THROMBOL ARTERIAL INITIAL (MS); ADDITIONAL  ARTERIOGRAPHY; IR ULTRASOUND GUIDANCE VASC ACCESS RIGHT; BILATERAL PULMONARY ARTERIOGRAPHY COMPARISON:  Chest CTA 02/06/2016 MEDICATIONS: None. ANESTHESIA/SEDATION: Versed 0.5 mg IV; Fentanyl 50 mcg IV Moderate Sedation Time:  53 minutes The patient was continuously monitored during the procedure by the interventional radiology nurse under my direct supervision. FLUOROSCOPY TIME:  Fluoroscopy Time: 6 minutes 18 seconds (66 mGy). COMPLICATIONS: None immediate. TECHNIQUE: Informed written consent was obtained from the patient after a thorough discussion of the procedural risks, benefits and alternatives. All questions were addressed. Maximal Sterile Barrier Technique was utilized including caps, mask, sterile gowns, sterile gloves, sterile drape, hand hygiene and skin antiseptic. A timeout was performed prior to the initiation of the procedure. The right common femoral vein was interrogated with ultrasound. An image was obtained and stored for the medical record. Local anesthesia was attained by infiltration with 1% lidocaine. A small dermatotomy was made. Under real-time sonographic guidance, the vessel was punctured with a 21 gauge micropuncture needle. Using standard technique, the initial micro needle was exchanged over a 0.018 micro wire for a transitional 4 [place] micro sheath. The micro sheath was then exchanged over a 0.035 wire for a working 6 [place] vascular sheath. Using the same technique, a second ultrasound-guided puncture of the right common femoral vein was made slightly medial to the first. The micro needle was then exchanged over the same steps for a second working 6 [place] vascular sheath. There are now 2  separate sheaths in the right common femoral vein. Starting with more lateral sheath, a C2 cobra catheter was advanced over a a Bentson wire in used to navigate through the heart and into the left main pulmonary artery. A left main pulmonary arteriogram was performed. Multiple filling defects are  identified consistent with multifocal PG. The catheter was then navigated into the left lower lobe pulmonary artery. Repeat arteriography was performed confirming the anatomic position. A Rosen wire was advanced into the left lower lobe pulmonary artery through the catheter. Attention was turned to the more medial 6 [place] sheath. Again, a C2 cobra catheter was advanced over a Bentson wire into the heart and navigated through the heart and into the right main pulmonary artery. A right main pulmonary arteriogram was performed. There is significant outflow obstruction with a large clot at the bifurcation into the upper and lower lobe pulmonary arteries. Pressures were measured. The pulmonary arterial pressure was 53/29 for a mean of 30 mm Hg. The catheter was advanced into the right lower lobe pulmonary artery. Contrast injection was performed under fluoroscopy to confirm the anatomic position. A Rosen wire was advanced. Two EKOS 12 cm infusion length ultrasound assisted infusion catheters were then selected in advanced over the wires and positioned in the right and left lower lobe pulmonary arteries. Bilateral pulmonary arterial I cysts was then initiated at a rate of 0.5 milligrams/hour per catheter for a total rate of 1.0 milligrams/hour. The catheters were secured in place. The patient tolerated the procedure well. FINDINGS: 1. Multifocal bilateral pulmonary emboli. 2. Mean main pulmonary artery pressure 30 mm Hg consistent with pulmonary arterial hypertension. IMPRESSION: Successful initiation of bilateral pulmonary artery ultrasound accelerated catheter directed thrombolysis. Signed, Criselda Peaches, MD [facility] Radiology Electronically Signed   By: Jacqulynn Cadet M.D.   On: 02/07/2016 14:50   Ir Infusion Thrombol Arterial Initial (ms)  Result Date: 02/07/2016 INDICATION: 75 year old female with sub massive pulmonary embolus and acute cor pulmonale.  EXAM: IR INFUSION THROMBOL ARTERIAL INITIAL (MS); ADDITIONAL ARTERIOGRAPHY; IR ULTRASOUND GUIDANCE VASC ACCESS RIGHT; BILATERAL PULMONARY ARTERIOGRAPHY COMPARISON:  Chest CTA 02/06/2016 MEDICATIONS: None. ANESTHESIA/SEDATION: Versed 0.5 mg IV; Fentanyl 50 mcg IV Moderate Sedation Time:  53 minutes The patient was continuously monitored during the procedure by the interventional radiology nurse under my direct supervision. FLUOROSCOPY TIME:  Fluoroscopy Time: 6 minutes 18 seconds (66 mGy). COMPLICATIONS: None immediate. TECHNIQUE: Informed written consent was obtained from the patient after a thorough discussion of the procedural risks, benefits and alternatives. All questions were addressed. Maximal Sterile Barrier Technique was utilized including caps, mask, sterile gowns, sterile gloves, sterile drape, hand hygiene and skin antiseptic. A timeout was performed prior to the initiation of the procedure. The right common femoral vein was interrogated with ultrasound. An image was obtained and stored for the medical record. Local anesthesia was attained by infiltration with 1% lidocaine. A small dermatotomy was made. Under real-time sonographic guidance, the vessel was punctured with a 21 gauge micropuncture needle. Using standard technique, the initial micro needle was exchanged over a 0.018 micro wire for a transitional 4 [place] micro sheath. The micro sheath was then exchanged over a 0.035 wire for a working 6 [place] vascular sheath. Using the same technique, a second ultrasound-guided puncture of the right common femoral vein was made slightly medial to the first. The micro needle was then exchanged over the same steps for a second working 6 [place] vascular sheath. There are now 2 separate sheaths in  the right common femoral vein. Starting with more lateral sheath, a C2 cobra catheter was advanced over a a Bentson wire in used to navigate through the heart and into the left main pulmonary artery. A left main  pulmonary arteriogram was performed. Multiple filling defects are identified consistent with multifocal PG. The catheter was then navigated into the left lower lobe pulmonary artery. Repeat arteriography was performed confirming the anatomic position. A Rosen wire was advanced into the left lower lobe pulmonary artery through the catheter. Attention was turned to the more medial 6 French sheath. Again, a C2 cobra catheter was advanced over a Bentson wire into the heart and navigated through the heart and into the right main pulmonary artery. A right main pulmonary arteriogram was performed. There is significant outflow obstruction with a large clot at the bifurcation into the upper and lower lobe pulmonary arteries. Pressures were measured. The pulmonary arterial pressure was 53/29 for a mean of 30 mm Hg. The catheter was advanced into the right lower lobe pulmonary artery. Contrast injection was performed under fluoroscopy to confirm the anatomic position. A Rosen wire was advanced. Two EKOS 12 cm infusion length ultrasound assisted infusion catheters were then selected in advanced over the wires and positioned in the right and left lower lobe pulmonary arteries. Bilateral pulmonary arterial I cysts was then initiated at a rate of 0.5 milligrams/hour per catheter for a total rate of 1.0 milligrams/hour. The catheters were secured in place. The patient tolerated the procedure well. FINDINGS: 1. Multifocal bilateral pulmonary emboli. 2. Mean main pulmonary artery pressure 30 mm Hg consistent with pulmonary arterial hypertension. IMPRESSION: Successful initiation of bilateral pulmonary artery ultrasound accelerated catheter directed thrombolysis. Signed, Criselda Peaches, MD [facility] Radiology Electronically Signed   By: Jacqulynn Cadet M.D.   On: 02/07/2016 14:50    Cardiac Studies   Echocardiogram 02/04/2016: Study Conclusions  - Left ventricle: The  cavity size was normal. Wall thickness was   increased in a pattern of moderate LVH. Systolic function was   normal. The estimated ejection fraction was in the range of 55%   to 60%. Wall motion was normal; there were no regional wall   motion abnormalities. Doppler parameters are consistent with   abnormal left ventricular relaxation (grade 1 diastolic   dysfunction). - Aortic valve: Moderately calcified annulus. Moderately thickened,   moderately calcified leaflets. There was mild regurgitation. - Right ventricle: The cavity size was mildly dilated. Systolic   function was mildly to moderately reduced. - Right atrium: The atrium was moderately dilated. - Tricuspid valve: There was mild-moderate regurgitation. - Pulmonary arteries: Systolic pressure was moderately to severely   increased. PA peak pressure: 67 mm Hg (S).  Patient Profile     75 year old woman with hypertension and nonobstructive CAD by recent cardiac catheterization, subsequent diagnosed with large bilateral pulmonary emboli associated with pulmonary hypertension and RV dysfunction. Patient seen by critical care and underwent catheter directed thrombolysis with observation in the ICU. Experienced significant bleeding at sheath sites last night and treament stopped. Sheaths pulled and she is stable at this time.  Assessment & Plan    1. Submassive bilateral pulmonary emboli, underwent catheter directed thrombolysis. Had significant bleeding at sheath sites and treatment stopped. Sheaths pulled. Echocardiogram demonstrated RV dysfunction with PASP 67 mmHg.  2. Nonobstructive CAD based on cardiac catheterization from November 22. No angina.  3. Elevated troponin I, likely secondary to problem #1.  4. Essential hypertension.  She  continues on ASA, Norvasc and Lipitor from a cardiac perspective. Expect she will be able to move out of unit for further management of pulmonary emboli and eventual transition to oral  anticoagulant - Pulmonary managing.  Signed, Satira Sark, M.D., F.A.C.C.  02/08/2016, 11:22 AM

## 2016-02-08 NOTE — Progress Notes (Signed)
Arrived to bedside for routine eco and groin assessment, linen saturated with bright red blood and clots, catheters appear in position, pt resting calmly with no sensation of drainage or discomfort family at bedside. manual pressure immediately applied by this rn & charge nurse paged IR doctor, elink notified and camera visual done by Dr Elsworth Soho, orders received by md to stop heparin and tpa infusions. Pt daughter to bedside, IR doctor updated & md states will be in within 10 minutes.

## 2016-02-08 NOTE — Progress Notes (Signed)
IR md at bedside holding manual pressure to right groin

## 2016-02-08 NOTE — Progress Notes (Addendum)
Name: Catherine Snyder MRN: [id number] DOB: Jan 14, 1941    ADMISSION DATE:  02/03/2016 CONSULTATION DATE:  02/06/16   REFERRING MD :  Dr Ellyn Hack, Cardiology  REASON FOR CONSULTATION:  Acute pulmonary embolism with right heart strain  BRIEF PATIENT DESCRIPTION: 75 year old woman with hypertension and treated bladder cancer, admitted for evaluation of severe dyspnea and found to have acute bilateral PE with right heart strain. Being considered for possible catheter directed lysis  SIGNIFICANT EVENTS  11/21 admitted for acute dyspnea, concern for possible CAD  STUDIES:  11/22 left and right catheterization >> nonobstructive coronary artery disease, pulmonary hypertension with estimated PA systolic in the [id number] >> V/q intermediate prob with wedge shaped defects,  11/24 CT-PA >> confirmed B large PE w evidence r heart strain   HISTORY OF PRESENT ILLNESS:  75 y.o.femalewith past medical history of HTN, bladder CA (s/p resection in 06/2015), and family history of CAD who presented to ED 11/21 with DOE and hypoxemia beginning 6 days PTA. Underwent L and R cath given suspicion for CAD. L cath was reassuring, R showed RV strain and PAH. V/q was intermediate prob, wedge shaped perfusion defects. CT-PA confirmed B large PE. PCCM consulted regarding suitability for catheter directed lytics.    SUBJECTIVE:  Had bleeding per femoral IV site last night. Got 2 upRBC and IVF 1.7 L.   Comfortable. No bleeding this am.   VITAL SIGNS: Temp:  [97.3 F (36.3 C)-99.1 F (37.3 C)] 98.3 F (36.8 C) (11/26 1600) Pulse Rate:  [58-76] 76 (11/26 1800) Resp:  [14-23] 22 (11/26 1800) BP: (104-126)/(62-96) 104/66 (11/26 1800) SpO2:  [95 %-100 %] 98 % (11/26 1800)  PHYSICAL EXAMINATION: General:  Pleasant overwt woman, NAD.  Neuro:  Awake and appropriate, non-focal HEENT:  Op clear, no lesions Cardiovascular:  Regular, 2/6 sys M Lungs:  Distant, decreased at bases.  Abdomen:  benign   Recent  Labs Lab 02/04/16 0741 02/04/16 1633 02/05/16 0301 02/08/16 0038  NA 141  --  140 139  K 3.6  --  3.7 3.3*  CL 109  --  111 110  CO2 22  --  21* 23  BUN 12  --  10 5*  CREATININE 0.84 0.79 0.79 0.66  GLUCOSE 134*  --  116* 123*    Recent Labs Lab 02/08/16 0500 02/08/16 1222 02/08/16 1819  HGB 8.5* 8.7* 9.2*  HCT 27.6* 28.1* 29.7*  WBC 7.0 6.5 6.3  PLT 204 194 213   Ir Angiogram Pulmonary Bilateral Selective  Result Date: 02/07/2016 INDICATION: 75 year old female with sub massive pulmonary embolus and acute cor pulmonale. EXAM: IR INFUSION THROMBOL ARTERIAL INITIAL (MS); ADDITIONAL ARTERIOGRAPHY; IR ULTRASOUND GUIDANCE VASC ACCESS RIGHT; BILATERAL PULMONARY ARTERIOGRAPHY COMPARISON:  Chest CTA 02/06/2016 MEDICATIONS: None. ANESTHESIA/SEDATION: Versed 0.5 mg IV; Fentanyl 50 mcg IV Moderate Sedation Time:  53 minutes The patient was continuously monitored during the procedure by the interventional radiology nurse under my direct supervision. FLUOROSCOPY TIME:  Fluoroscopy Time: 6 minutes 18 seconds (66 mGy). COMPLICATIONS: None immediate. TECHNIQUE: Informed written consent was obtained from the patient after a thorough discussion of the procedural risks, benefits and alternatives. All questions were addressed. Maximal Sterile Barrier Technique was utilized including caps, mask, sterile gowns, sterile gloves, sterile drape, hand hygiene and skin antiseptic. A timeout was performed prior to the initiation of the procedure. The right common femoral vein was interrogated with ultrasound. An image was obtained and stored for the medical record. Local anesthesia was attained by infiltration with 1% lidocaine.  A small dermatotomy was made. Under real-time sonographic guidance, the vessel was punctured with a 21 gauge micropuncture needle. Using standard technique, the initial micro needle was exchanged over a 0.018 micro wire for a transitional 4 [place] micro sheath. The micro sheath was then  exchanged over a 0.035 wire for a working 6 [place] vascular sheath. Using the same technique, a second ultrasound-guided puncture of the right common femoral vein was made slightly medial to the first. The micro needle was then exchanged over the same steps for a second working 6 [place] vascular sheath. There are now 2 separate sheaths in the right common femoral vein. Starting with more lateral sheath, a C2 cobra catheter was advanced over a a Bentson wire in used to navigate through the heart and into the left main pulmonary artery. A left main pulmonary arteriogram was performed. Multiple filling defects are identified consistent with multifocal PG. The catheter was then navigated into the left lower lobe pulmonary artery. Repeat arteriography was performed confirming the anatomic position. A Rosen wire was advanced into the left lower lobe pulmonary artery through the catheter. Attention was turned to the more medial 6 French sheath. Again, a C2 cobra catheter was advanced over a Bentson wire into the heart and navigated through the heart and into the right main pulmonary artery. A right main pulmonary arteriogram was performed. There is significant outflow obstruction with a large clot at the bifurcation into the upper and lower lobe pulmonary arteries. Pressures were measured. The pulmonary arterial pressure was 53/29 for a mean of 30 mm Hg. The catheter was advanced into the right lower lobe pulmonary artery. Contrast injection was performed under fluoroscopy to confirm the anatomic position. A Rosen wire was advanced. Two EKOS 12 cm infusion length ultrasound assisted infusion catheters were then selected in advanced over the wires and positioned in the right and left lower lobe pulmonary arteries. Bilateral pulmonary arterial I cysts was then initiated at a rate of 0.5 milligrams/hour per catheter for a total rate of 1.0 milligrams/hour. The catheters were secured in place. The patient tolerated the  procedure well. FINDINGS: 1. Multifocal bilateral pulmonary emboli. 2. Mean main pulmonary artery pressure 30 mm Hg consistent with pulmonary arterial hypertension. IMPRESSION: Successful initiation of bilateral pulmonary artery ultrasound accelerated catheter directed thrombolysis. Signed, Criselda Peaches, MD [facility] Radiology Electronically Signed   By: Jacqulynn Cadet M.D.   On: 02/07/2016 14:50   Ir Angiogram Selective Each Additional Vessel  Result Date: 02/07/2016 INDICATION: 75 year old female with sub massive pulmonary embolus and acute cor pulmonale. EXAM: IR INFUSION THROMBOL ARTERIAL INITIAL (MS); ADDITIONAL ARTERIOGRAPHY; IR ULTRASOUND GUIDANCE VASC ACCESS RIGHT; BILATERAL PULMONARY ARTERIOGRAPHY COMPARISON:  Chest CTA 02/06/2016 MEDICATIONS: None. ANESTHESIA/SEDATION: Versed 0.5 mg IV; Fentanyl 50 mcg IV Moderate Sedation Time:  53 minutes The patient was continuously monitored during the procedure by the interventional radiology nurse under my direct supervision. FLUOROSCOPY TIME:  Fluoroscopy Time: 6 minutes 18 seconds (66 mGy). COMPLICATIONS: None immediate. TECHNIQUE: Informed written consent was obtained from the patient after a thorough discussion of the procedural risks, benefits and alternatives. All questions were addressed. Maximal Sterile Barrier Technique was utilized including caps, mask, sterile gowns, sterile gloves, sterile drape, hand hygiene and skin antiseptic. A timeout was performed prior to the initiation of the procedure. The right common femoral vein was interrogated with ultrasound. An image was obtained and stored for the medical record. Local anesthesia was attained by infiltration with 1% lidocaine. A small dermatotomy  was made. Under real-time sonographic guidance, the vessel was punctured with a 21 gauge micropuncture needle. Using standard technique, the initial micro needle was exchanged over a 0.018 micro  wire for a transitional 4 [place] micro sheath. The micro sheath was then exchanged over a 0.035 wire for a working 6 [place] vascular sheath. Using the same technique, a second ultrasound-guided puncture of the right common femoral vein was made slightly medial to the first. The micro needle was then exchanged over the same steps for a second working 6 [place] vascular sheath. There are now 2 separate sheaths in the right common femoral vein. Starting with more lateral sheath, a C2 cobra catheter was advanced over a a Bentson wire in used to navigate through the heart and into the left main pulmonary artery. A left main pulmonary arteriogram was performed. Multiple filling defects are identified consistent with multifocal PG. The catheter was then navigated into the left lower lobe pulmonary artery. Repeat arteriography was performed confirming the anatomic position. A Rosen wire was advanced into the left lower lobe pulmonary artery through the catheter. Attention was turned to the more medial 6 French sheath. Again, a C2 cobra catheter was advanced over a Bentson wire into the heart and navigated through the heart and into the right main pulmonary artery. A right main pulmonary arteriogram was performed. There is significant outflow obstruction with a large clot at the bifurcation into the upper and lower lobe pulmonary arteries. Pressures were measured. The pulmonary arterial pressure was 53/29 for a mean of 30 mm Hg. The catheter was advanced into the right lower lobe pulmonary artery. Contrast injection was performed under fluoroscopy to confirm the anatomic position. A Rosen wire was advanced. Two EKOS 12 cm infusion length ultrasound assisted infusion catheters were then selected in advanced over the wires and positioned in the right and left lower lobe pulmonary arteries. Bilateral pulmonary arterial I cysts was then initiated at a rate of 0.5 milligrams/hour per catheter for a total rate of 1.0  milligrams/hour. The catheters were secured in place. The patient tolerated the procedure well. FINDINGS: 1. Multifocal bilateral pulmonary emboli. 2. Mean main pulmonary artery pressure 30 mm Hg consistent with pulmonary arterial hypertension. IMPRESSION: Successful initiation of bilateral pulmonary artery ultrasound accelerated catheter directed thrombolysis. Signed, Criselda Peaches, MD [facility] Radiology Electronically Signed   By: Jacqulynn Cadet M.D.   On: 02/07/2016 14:50   Ir Angiogram Selective Each Additional Vessel  Result Date: 02/07/2016 INDICATION: 75 year old female with sub massive pulmonary embolus and acute cor pulmonale. EXAM: IR INFUSION THROMBOL ARTERIAL INITIAL (MS); ADDITIONAL ARTERIOGRAPHY; IR ULTRASOUND GUIDANCE VASC ACCESS RIGHT; BILATERAL PULMONARY ARTERIOGRAPHY COMPARISON:  Chest CTA 02/06/2016 MEDICATIONS: None. ANESTHESIA/SEDATION: Versed 0.5 mg IV; Fentanyl 50 mcg IV Moderate Sedation Time:  53 minutes The patient was continuously monitored during the procedure by the interventional radiology nurse under my direct supervision. FLUOROSCOPY TIME:  Fluoroscopy Time: 6 minutes 18 seconds (66 mGy). COMPLICATIONS: None immediate. TECHNIQUE: Informed written consent was obtained from the patient after a thorough discussion of the procedural risks, benefits and alternatives. All questions were addressed. Maximal Sterile Barrier Technique was utilized including caps, mask, sterile gowns, sterile gloves, sterile drape, hand hygiene and skin antiseptic. A timeout was performed prior to the initiation of the procedure. The right common femoral vein was interrogated with ultrasound. An image was obtained and stored for the medical record. Local anesthesia was attained by infiltration with 1% lidocaine. A small dermatotomy was made. Under  real-time sonographic guidance, the vessel was punctured with a 21 gauge micropuncture needle. Using  standard technique, the initial micro needle was exchanged over a 0.018 micro wire for a transitional 4 [place] micro sheath. The micro sheath was then exchanged over a 0.035 wire for a working 6 [place] vascular sheath. Using the same technique, a second ultrasound-guided puncture of the right common femoral vein was made slightly medial to the first. The micro needle was then exchanged over the same steps for a second working 6 [place] vascular sheath. There are now 2 separate sheaths in the right common femoral vein. Starting with more lateral sheath, a C2 cobra catheter was advanced over a a Bentson wire in used to navigate through the heart and into the left main pulmonary artery. A left main pulmonary arteriogram was performed. Multiple filling defects are identified consistent with multifocal PG. The catheter was then navigated into the left lower lobe pulmonary artery. Repeat arteriography was performed confirming the anatomic position. A Rosen wire was advanced into the left lower lobe pulmonary artery through the catheter. Attention was turned to the more medial 6 French sheath. Again, a C2 cobra catheter was advanced over a Bentson wire into the heart and navigated through the heart and into the right main pulmonary artery. A right main pulmonary arteriogram was performed. There is significant outflow obstruction with a large clot at the bifurcation into the upper and lower lobe pulmonary arteries. Pressures were measured. The pulmonary arterial pressure was 53/29 for a mean of 30 mm Hg. The catheter was advanced into the right lower lobe pulmonary artery. Contrast injection was performed under fluoroscopy to confirm the anatomic position. A Rosen wire was advanced. Two EKOS 12 cm infusion length ultrasound assisted infusion catheters were then selected in advanced over the wires and positioned in the right and left lower lobe pulmonary arteries. Bilateral pulmonary arterial I cysts was then initiated at a  rate of 0.5 milligrams/hour per catheter for a total rate of 1.0 milligrams/hour. The catheters were secured in place. The patient tolerated the procedure well. FINDINGS: 1. Multifocal bilateral pulmonary emboli. 2. Mean main pulmonary artery pressure 30 mm Hg consistent with pulmonary arterial hypertension. IMPRESSION: Successful initiation of bilateral pulmonary artery ultrasound accelerated catheter directed thrombolysis. Signed, Criselda Peaches, MD [facility] Radiology Electronically Signed   By: Jacqulynn Cadet M.D.   On: 02/07/2016 14:50   Ir US Guide Vasc Access Right  Result Date: 02/07/2016 INDICATION: 75 year old female with sub massive pulmonary embolus and acute cor pulmonale. EXAM: IR INFUSION THROMBOL ARTERIAL INITIAL (MS); ADDITIONAL ARTERIOGRAPHY; IR ULTRASOUND GUIDANCE VASC ACCESS RIGHT; BILATERAL PULMONARY ARTERIOGRAPHY COMPARISON:  Chest CTA 02/06/2016 MEDICATIONS: None. ANESTHESIA/SEDATION: Versed 0.5 mg IV; Fentanyl 50 mcg IV Moderate Sedation Time:  53 minutes The patient was continuously monitored during the procedure by the interventional radiology nurse under my direct supervision. FLUOROSCOPY TIME:  Fluoroscopy Time: 6 minutes 18 seconds (66 mGy). COMPLICATIONS: None immediate. TECHNIQUE: Informed written consent was obtained from the patient after a thorough discussion of the procedural risks, benefits and alternatives. All questions were addressed. Maximal Sterile Barrier Technique was utilized including caps, mask, sterile gowns, sterile gloves, sterile drape, hand hygiene and skin antiseptic. A timeout was performed prior to the initiation of the procedure. The right common femoral vein was interrogated with ultrasound. An image was obtained and stored for the medical record. Local anesthesia was attained by infiltration with 1% lidocaine. A small dermatotomy was made. Under real-time sonographic guidance,  the vessel was  punctured with a 21 gauge micropuncture needle. Using standard technique, the initial micro needle was exchanged over a 0.018 micro wire for a transitional 4 [place] micro sheath. The micro sheath was then exchanged over a 0.035 wire for a working 6 [place] vascular sheath. Using the same technique, a second ultrasound-guided puncture of the right common femoral vein was made slightly medial to the first. The micro needle was then exchanged over the same steps for a second working 6 [place] vascular sheath. There are now 2 separate sheaths in the right common femoral vein. Starting with more lateral sheath, a C2 cobra catheter was advanced over a a Bentson wire in used to navigate through the heart and into the left main pulmonary artery. A left main pulmonary arteriogram was performed. Multiple filling defects are identified consistent with multifocal PG. The catheter was then navigated into the left lower lobe pulmonary artery. Repeat arteriography was performed confirming the anatomic position. A Rosen wire was advanced into the left lower lobe pulmonary artery through the catheter. Attention was turned to the more medial 6 French sheath. Again, a C2 cobra catheter was advanced over a Bentson wire into the heart and navigated through the heart and into the right main pulmonary artery. A right main pulmonary arteriogram was performed. There is significant outflow obstruction with a large clot at the bifurcation into the upper and lower lobe pulmonary arteries. Pressures were measured. The pulmonary arterial pressure was 53/29 for a mean of 30 mm Hg. The catheter was advanced into the right lower lobe pulmonary artery. Contrast injection was performed under fluoroscopy to confirm the anatomic position. A Rosen wire was advanced. Two EKOS 12 cm infusion length ultrasound assisted infusion catheters were then selected in advanced over the wires and positioned in the right and left lower lobe pulmonary arteries.  Bilateral pulmonary arterial I cysts was then initiated at a rate of 0.5 milligrams/hour per catheter for a total rate of 1.0 milligrams/hour. The catheters were secured in place. The patient tolerated the procedure well. FINDINGS: 1. Multifocal bilateral pulmonary emboli. 2. Mean main pulmonary artery pressure 30 mm Hg consistent with pulmonary arterial hypertension. IMPRESSION: Successful initiation of bilateral pulmonary artery ultrasound accelerated catheter directed thrombolysis. Signed, Criselda Peaches, MD [facility] Radiology Electronically Signed   By: Jacqulynn Cadet M.D.   On: 02/07/2016 14:50   Dg Chest Port 1 View  Result Date: 02/08/2016 CLINICAL DATA:  75 year old female with sub massive pulmonary embolus status post bilateral pulmonary artery ultrasound accelerated catheter directed thrombolysis yesterday. Initial encounter. EXAM: PORTABLE CHEST 1 VIEW COMPARISON:  Chest CTA 02/06/2016. Chest radiographs 02/03/2016 and earlier. FINDINGS: Portable AP supine view at 0434 hours. Stable cardiomegaly and mediastinal contours. Relatively stable lung volumes compared to 02/03/2016. Allowing for portable technique the lungs are clear. No pneumothorax or pleural effusion. Visualized tracheal air column is within normal limits. IMPRESSION: Stable cardiomegaly and no acute cardiopulmonary abnormality following pulmonary artery catheter directed thrombolysis. Electronically Signed   By: Genevie Ann M.D.   On: 02/08/2016 06:36   Ir Infusion Thrombol Arterial Initial (ms)  Result Date: 02/07/2016 INDICATION: 75 year old female with sub massive pulmonary embolus and acute cor pulmonale. EXAM: IR INFUSION THROMBOL ARTERIAL INITIAL (MS); ADDITIONAL ARTERIOGRAPHY; IR ULTRASOUND GUIDANCE VASC ACCESS RIGHT; BILATERAL PULMONARY ARTERIOGRAPHY COMPARISON:  Chest CTA 02/06/2016 MEDICATIONS: None. ANESTHESIA/SEDATION: Versed 0.5 mg IV; Fentanyl 50 mcg IV Moderate  Sedation Time:  53 minutes The patient was continuously monitored during the  procedure by the interventional radiology nurse under my direct supervision. FLUOROSCOPY TIME:  Fluoroscopy Time: 6 minutes 18 seconds (66 mGy). COMPLICATIONS: None immediate. TECHNIQUE: Informed written consent was obtained from the patient after a thorough discussion of the procedural risks, benefits and alternatives. All questions were addressed. Maximal Sterile Barrier Technique was utilized including caps, mask, sterile gowns, sterile gloves, sterile drape, hand hygiene and skin antiseptic. A timeout was performed prior to the initiation of the procedure. The right common femoral vein was interrogated with ultrasound. An image was obtained and stored for the medical record. Local anesthesia was attained by infiltration with 1% lidocaine. A small dermatotomy was made. Under real-time sonographic guidance, the vessel was punctured with a 21 gauge micropuncture needle. Using standard technique, the initial micro needle was exchanged over a 0.018 micro wire for a transitional 4 [place] micro sheath. The micro sheath was then exchanged over a 0.035 wire for a working 6 [place] vascular sheath. Using the same technique, a second ultrasound-guided puncture of the right common femoral vein was made slightly medial to the first. The micro needle was then exchanged over the same steps for a second working 6 [place] vascular sheath. There are now 2 separate sheaths in the right common femoral vein. Starting with more lateral sheath, a C2 cobra catheter was advanced over a a Bentson wire in used to navigate through the heart and into the left main pulmonary artery. A left main pulmonary arteriogram was performed. Multiple filling defects are identified consistent with multifocal PG. The catheter was then navigated into the left lower lobe pulmonary artery. Repeat arteriography was performed confirming the anatomic position. A Rosen wire was advanced  into the left lower lobe pulmonary artery through the catheter. Attention was turned to the more medial 6 [place] sheath. Again, a C2 cobra catheter was advanced over a Bentson wire into the heart and navigated through the heart and into the right main pulmonary artery. A right main pulmonary arteriogram was performed. There is significant outflow obstruction with a large clot at the bifurcation into the upper and lower lobe pulmonary arteries. Pressures were measured. The pulmonary arterial pressure was 53/29 for a mean of 30 mm Hg. The catheter was advanced into the right lower lobe pulmonary artery. Contrast injection was performed under fluoroscopy to confirm the anatomic position. A Rosen wire was advanced. Two EKOS 12 cm infusion length ultrasound assisted infusion catheters were then selected in advanced over the wires and positioned in the right and left lower lobe pulmonary arteries. Bilateral pulmonary arterial I cysts was then initiated at a rate of 0.5 milligrams/hour per catheter for a total rate of 1.0 milligrams/hour. The catheters were secured in place. The patient tolerated the procedure well. FINDINGS: 1. Multifocal bilateral pulmonary emboli. 2. Mean main pulmonary artery pressure 30 mm Hg consistent with pulmonary arterial hypertension. IMPRESSION: Successful initiation of bilateral pulmonary artery ultrasound accelerated catheter directed thrombolysis. Signed, Criselda Peaches, MD [facility] Radiology Electronically Signed   By: Jacqulynn Cadet M.D.   On: 02/07/2016 14:50   Ir Infusion Thrombol Arterial Initial (ms)  Result Date: 02/07/2016 INDICATION: 75 year old female with sub massive pulmonary embolus and acute cor pulmonale. EXAM: IR INFUSION THROMBOL ARTERIAL INITIAL (MS); ADDITIONAL ARTERIOGRAPHY; IR ULTRASOUND GUIDANCE VASC ACCESS RIGHT; BILATERAL PULMONARY ARTERIOGRAPHY COMPARISON:  Chest CTA 02/06/2016 MEDICATIONS: None.  ANESTHESIA/SEDATION: Versed 0.5 mg IV; Fentanyl 50 mcg IV Moderate Sedation Time:  53 minutes The patient was continuously monitored during the procedure by the  interventional radiology nurse under my direct supervision. FLUOROSCOPY TIME:  Fluoroscopy Time: 6 minutes 18 seconds (66 mGy). COMPLICATIONS: None immediate. TECHNIQUE: Informed written consent was obtained from the patient after a thorough discussion of the procedural risks, benefits and alternatives. All questions were addressed. Maximal Sterile Barrier Technique was utilized including caps, mask, sterile gowns, sterile gloves, sterile drape, hand hygiene and skin antiseptic. A timeout was performed prior to the initiation of the procedure. The right common femoral vein was interrogated with ultrasound. An image was obtained and stored for the medical record. Local anesthesia was attained by infiltration with 1% lidocaine. A small dermatotomy was made. Under real-time sonographic guidance, the vessel was punctured with a 21 gauge micropuncture needle. Using standard technique, the initial micro needle was exchanged over a 0.018 micro wire for a transitional 4 [place] micro sheath. The micro sheath was then exchanged over a 0.035 wire for a working 6 [place] vascular sheath. Using the same technique, a second ultrasound-guided puncture of the right common femoral vein was made slightly medial to the first. The micro needle was then exchanged over the same steps for a second working 6 [place] vascular sheath. There are now 2 separate sheaths in the right common femoral vein. Starting with more lateral sheath, a C2 cobra catheter was advanced over a a Bentson wire in used to navigate through the heart and into the left main pulmonary artery. A left main pulmonary arteriogram was performed. Multiple filling defects are identified consistent with multifocal PG. The catheter was then navigated into the left lower lobe pulmonary artery. Repeat arteriography was  performed confirming the anatomic position. A Rosen wire was advanced into the left lower lobe pulmonary artery through the catheter. Attention was turned to the more medial 6 French sheath. Again, a C2 cobra catheter was advanced over a Bentson wire into the heart and navigated through the heart and into the right main pulmonary artery. A right main pulmonary arteriogram was performed. There is significant outflow obstruction with a large clot at the bifurcation into the upper and lower lobe pulmonary arteries. Pressures were measured. The pulmonary arterial pressure was 53/29 for a mean of 30 mm Hg. The catheter was advanced into the right lower lobe pulmonary artery. Contrast injection was performed under fluoroscopy to confirm the anatomic position. A Rosen wire was advanced. Two EKOS 12 cm infusion length ultrasound assisted infusion catheters were then selected in advanced over the wires and positioned in the right and left lower lobe pulmonary arteries. Bilateral pulmonary arterial I cysts was then initiated at a rate of 0.5 milligrams/hour per catheter for a total rate of 1.0 milligrams/hour. The catheters were secured in place. The patient tolerated the procedure well. FINDINGS: 1. Multifocal bilateral pulmonary emboli. 2. Mean main pulmonary artery pressure 30 mm Hg consistent with pulmonary arterial hypertension. IMPRESSION: Successful initiation of bilateral pulmonary artery ultrasound accelerated catheter directed thrombolysis. Signed, Criselda Peaches, MD [facility] Electronically Signed   By: Jacqulynn Cadet M.D.   On: 02/07/2016 14:50    ASSESSMENT  Acute bilateral pulmonary emboli. 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 Had bleeding per catheter site requiring 2upRBC 11/26. No signs of bleeding now.  Acute respiratory failure with hypoxemia. Clinically improved. On [place] now.  Secondary  pulmonary hypertension with RV strain HTN  Plan : Keep in ICU after episode of bleeding and hypotension with EKOS. Almost finished 12 hrs of EKOS.  No blood  draws 2/2 recent EKOS.  NPO for now as she is laying flat.  Cont IVF.  Potential transfer in am if no issues.  Pt is DNR. Plan d/w pt.   Monica Becton, MD 02/08/2016, 8:01 PM [place] [facility] Pager [phone] After 3 pm or if no answer, call [phone]

## 2016-02-08 NOTE — Progress Notes (Signed)
Spoke to [place]. It is ok to remove the sheath and hold pressure for at least 10-15 min. Rt femoral sheath removed. Pressure hold. Pressure dressing applied. No bleeding noted. Pt tolerated well. Will monitor sited to s/s of bleeding often

## 2016-02-08 NOTE — Progress Notes (Signed)
Dr. Ancil Linsey to bedside, md assumed manual pressure from this rn.

## 2016-02-08 NOTE — Progress Notes (Signed)
eLink Physician-Brief Progress Note Patient Name: ADRENE MANN DOB: 01/31/1941 MRN: [id number]   Date of Service  02/08/2016  HPI/Events of Note  Hypokalemia  eICU Interventions  Potassium replaced     Intervention Category Minor Interventions: Electrolytes abnormality - evaluation and management  Lydiana Milley 02/08/2016, 6:09 AM

## 2016-02-08 NOTE — Progress Notes (Signed)
ANTICOAGULATION CONSULT NOTE - Follow Up Consult  Pharmacy Consult for Heparin  Indication: chest pain/ACS  No Known Allergies  Patient Measurements: Height: 5\' 3"  (160 cm) Weight: 165 lb (74.8 kg) IBW/kg (Calculated) : 52.4  Vital Signs: Temp: 98.9 F (37.2 C) (11/26 0841) Temp Source: Oral (11/26 0841) BP: 126/77 (11/26 1000) Pulse Rate: 64 (11/26 1000)  Labs:  Recent Labs  02/07/16 0904  02/07/16 1824 02/07/16 2319 02/08/16 0038 02/08/16 0420 02/08/16 0500  HGB  --   < > 9.6* 8.8*  --   --  8.5*  HCT  --   < > 31.9* 28.6*  --   --  27.6*  PLT  --   < > 250 249  --   --  204  APTT 80*  --  92*  --   --   --  28  HEPARINUNFRC  --   < > 0.76* 0.59  --  <0.10*  --   CREATININE  --   --   --   --  0.66  --   --   < > = values in this interval not displayed. Estimated Creatinine Clearance: 59.8 mL/min (by C-G formula based on SCr of 0.66 mg/dL).  Medical History: Past Medical History:  Diagnosis Date  . Bladder cancer ([place])    a. s/p resection in 06/2015  . Dysuria   . Frequency of urination   . Hematuria   . Hypertension   . Nocturia   . Wears glasses   . Wears partial dentures    upper   Assessment: 75 y/o F on heparin for bilateral PE. CT angio positive for acute PE consistent with at least submassive PE.  He was initially transitioned to xarelto, for which he received 3 doses (last dose 0955 on 11/24).   Patient's heparin was stopped secondary to massive bleeding from EKOS sheath site. All catheters have been removed and will restart heparin per Dr. Laurence Ferrari at previous dose.  Goal of Therapy:  Heparin level 0.3-0.7 Monitor platelets by anticoagulation protocol: Yes  Goal aPTT 66-102   Plan:  Start heparin 950 units/hr 6h aPTT/HL Daily aPTT/HL/CBC Monitor s/sx of bleeding F/u oral anticoagulant plan  Andrey Cota. Diona Foley, PharmD, BCPS Clinical Pharmacist Pager [phone] 02/08/2016 11:40 AM

## 2016-02-08 NOTE — Progress Notes (Signed)
Called by elink to evaluate hemorrage at vascular access site. Came to bedside and held pressure to achieve hemostasis. IR came to assist shortly after. Large amounts of blood were in the bed, and the patient became hypotensive. 2L of IVF were rapidly infused via the sheathes after removal of the ekkos catheters, and 1 U of unmatched PRBCs, with good response. IR held pressure on the site and removed the catheter which had been hemorrhaging. After 30+ minutes of pressure, hemostasis was achieved and the site was dressed.  CRITICAL CARE Performed by: Luz Brazen   Total critical care time: 45 minutes  Critical care time was exclusive of separately billable procedures and treating other patients.  Critical care was necessary to treat or prevent imminent or life-threatening deterioration.  Critical care was time spent personally by me on the following activities: development of treatment plan with patient and/or surrogate as well as nursing, discussions with consultants, evaluation of patient's response to treatment, examination of patient, obtaining history from patient or surrogate, ordering and performing treatments and interventions, ordering and review of laboratory studies, ordering and review of radiographic studies, pulse oximetry and re-evaluation of patient's condition.  Luz Brazen, MD Pulmonary & Critical Care Medicine February 08, 2016, 2:20 AM

## 2016-02-08 NOTE — Progress Notes (Signed)
Patient ID: Catherine Snyder, female   DOB: 1940-12-27, 75 y.o.   MRN: [id number]    Referring Physician(s): Dr. Baltazar Apo  Supervising Physician: Jacqulynn Cadet  Patient Status: [facility] - In-pt  Chief Complaint: Submassive PE with right heart strain  Subjective: Events of overnight noted.  Massive bleeding at right inguinal site.  All EKOS catheters etc removed and pressure held.  Only the 36F catheter was left in place for central access.  She was transfused overnight.  She feels well this morning with no complaints.  She wants the other catheter out so she can sit up.  Denies SOB and states her breathing is improved.  Allergies: Patient has no known allergies.  Medications: Prior to Admission medications   Medication Sig Start Date End Date Taking? Authorizing Provider  amLODipine (NORVASC) 10 MG tablet take 1 tablet by mouth once daily 10/21/15  Yes Gildardo Cranker, DO  aspirin 81 MG chewable tablet Chew 81 mg by mouth daily.   Yes Historical Provider, MD  furosemide (LASIX) 20 MG tablet Take 1 tablet (20 mg total) by mouth daily. 02/02/16  Yes Lauree Chandler, NP  lisinopril (PRINIVIL,ZESTRIL) 2.5 MG tablet Take 1 tablet (2.5 mg total) by mouth daily. 02/03/16 05/03/16 Yes Erma Heritage, PA    Vital Signs: BP 126/77   Pulse 64   Temp 98.9 F (37.2 C) (Oral)   Resp (!) 21   Ht 5\' 3"  (1.6 m)   Wt 165 lb (74.8 kg)   SpO2 100%   BMI 29.23 kg/m   Physical Exam: Abd: soft, NT, right inguinal site with no further bleeding.  Catheter in place.  No evidence of hematoma, etc.  This is slightly tender to touch. Lungs: CTAB  Imaging: Ct Angio Chest Pe W Or Wo Contrast  Result Date: 02/06/2016 CLINICAL DATA:  Shortness of Breath, of positive V/Q scan EXAM: CT ANGIOGRAPHY CHEST WITH CONTRAST TECHNIQUE: Multidetector CT imaging of the chest was performed using the standard protocol during bolus administration of intravenous contrast. Multiplanar CT image reconstructions and  MIPs were obtained to evaluate the vascular anatomy. CONTRAST:  80 mL Isovue 370. COMPARISON:  02/04/2016 FINDINGS: Cardiovascular: Thoracic aorta shows diffuse atherosclerotic calcifications without aneurysmal dilatation. Pulmonary artery is well visualized and demonstrates significant bilateral pulmonary emboli. There is dilatation of the right ventricle with a an RV/LV ratio of 1.8 consistent with right heart strain. Coronary calcifications are noted. Mediastinum/Nodes: Thoracic inlet is within normal limits. No significant hilar or mediastinal adenopathy is noted. No axillary adenopathy is seen. Lungs/Pleura: Lungs are well aerated bilaterally without focal confluent infiltrate or sizable effusion. No findings to suggest pulmonary infarct are noted. No pneumothorax is seen. Upper Abdomen: Within normal limits. Musculoskeletal: Within normal limits. Review of the MIP images confirms the above findings. IMPRESSION: Positive for acute PE with CT evidence of right heart strain (RV/LV Ratio = 1.8) consistent with at least submassive (intermediate risk) PE. The presence of right heart strain has been associated with an increased risk of morbidity and mortality. Please activate Code PE by paging [phone]. Critical Value/emergent results were called by telephone at the time of interpretation on 02/06/2016 at 12:30 pm to Dr. Ellyn Hack, who verbally acknowledged these results. Electronically Signed   By: Inez Catalina M.D.   On: 02/06/2016 12:31   Ir Angiogram Pulmonary Bilateral Selective  Result Date: 02/07/2016 INDICATION: 75 year old female with sub massive pulmonary embolus and acute cor pulmonale. EXAM: IR INFUSION THROMBOL ARTERIAL INITIAL (MS); ADDITIONAL ARTERIOGRAPHY; IR ULTRASOUND GUIDANCE  VASC ACCESS RIGHT; BILATERAL PULMONARY ARTERIOGRAPHY COMPARISON:  Chest CTA 02/06/2016 MEDICATIONS: None. ANESTHESIA/SEDATION: Versed 0.5 mg IV; Fentanyl 50 mcg IV Moderate Sedation Time:  53 minutes The patient was  continuously monitored during the procedure by the interventional radiology nurse under my direct supervision. FLUOROSCOPY TIME:  Fluoroscopy Time: 6 minutes 18 seconds (66 mGy). COMPLICATIONS: None immediate. TECHNIQUE: Informed written consent was obtained from the patient after a thorough discussion of the procedural risks, benefits and alternatives. All questions were addressed. Maximal Sterile Barrier Technique was utilized including caps, mask, sterile gowns, sterile gloves, sterile drape, hand hygiene and skin antiseptic. A timeout was performed prior to the initiation of the procedure. The right common femoral vein was interrogated with ultrasound. An image was obtained and stored for the medical record. Local anesthesia was attained by infiltration with 1% lidocaine. A small dermatotomy was made. Under real-time sonographic guidance, the vessel was punctured with a 21 gauge micropuncture needle. Using standard technique, the initial micro needle was exchanged over a 0.018 micro wire for a transitional 4 [place] micro sheath. The micro sheath was then exchanged over a 0.035 wire for a working 6 [place] vascular sheath. Using the same technique, a second ultrasound-guided puncture of the right common femoral vein was made slightly medial to the first. The micro needle was then exchanged over the same steps for a second working 6 [place] vascular sheath. There are now 2 separate sheaths in the right common femoral vein. Starting with more lateral sheath, a C2 cobra catheter was advanced over a a Bentson wire in used to navigate through the heart and into the left main pulmonary artery. A left main pulmonary arteriogram was performed. Multiple filling defects are identified consistent with multifocal PG. The catheter was then navigated into the left lower lobe pulmonary artery. Repeat arteriography was performed confirming the anatomic position. A Rosen wire was advanced into the left lower lobe pulmonary artery  through the catheter. Attention was turned to the more medial 6 French sheath. Again, a C2 cobra catheter was advanced over a Bentson wire into the heart and navigated through the heart and into the right main pulmonary artery. A right main pulmonary arteriogram was performed. There is significant outflow obstruction with a large clot at the bifurcation into the upper and lower lobe pulmonary arteries. Pressures were measured. The pulmonary arterial pressure was 53/29 for a mean of 30 mm Hg. The catheter was advanced into the right lower lobe pulmonary artery. Contrast injection was performed under fluoroscopy to confirm the anatomic position. A Rosen wire was advanced. Two EKOS 12 cm infusion length ultrasound assisted infusion catheters were then selected in advanced over the wires and positioned in the right and left lower lobe pulmonary arteries. Bilateral pulmonary arterial I cysts was then initiated at a rate of 0.5 milligrams/hour per catheter for a total rate of 1.0 milligrams/hour. The catheters were secured in place. The patient tolerated the procedure well. FINDINGS: 1. Multifocal bilateral pulmonary emboli. 2. Mean main pulmonary artery pressure 30 mm Hg consistent with pulmonary arterial hypertension. IMPRESSION: Successful initiation of bilateral pulmonary artery ultrasound accelerated catheter directed thrombolysis. Signed, Criselda Peaches, MD [facility] Radiology Electronically Signed   By: Jacqulynn Cadet M.D.   On: 02/07/2016 14:50   Ir Angiogram Selective Each Additional Vessel  Result Date: 02/07/2016 INDICATION: 75 year old female with sub massive pulmonary embolus and acute cor pulmonale. EXAM: IR INFUSION THROMBOL ARTERIAL INITIAL (MS); ADDITIONAL ARTERIOGRAPHY; IR ULTRASOUND GUIDANCE VASC ACCESS RIGHT;  BILATERAL PULMONARY ARTERIOGRAPHY COMPARISON:  Chest CTA 02/06/2016 MEDICATIONS: None. ANESTHESIA/SEDATION: Versed 0.5 mg IV;  Fentanyl 50 mcg IV Moderate Sedation Time:  53 minutes The patient was continuously monitored during the procedure by the interventional radiology nurse under my direct supervision. FLUOROSCOPY TIME:  Fluoroscopy Time: 6 minutes 18 seconds (66 mGy). COMPLICATIONS: None immediate. TECHNIQUE: Informed written consent was obtained from the patient after a thorough discussion of the procedural risks, benefits and alternatives. All questions were addressed. Maximal Sterile Barrier Technique was utilized including caps, mask, sterile gowns, sterile gloves, sterile drape, hand hygiene and skin antiseptic. A timeout was performed prior to the initiation of the procedure. The right common femoral vein was interrogated with ultrasound. An image was obtained and stored for the medical record. Local anesthesia was attained by infiltration with 1% lidocaine. A small dermatotomy was made. Under real-time sonographic guidance, the vessel was punctured with a 21 gauge micropuncture needle. Using standard technique, the initial micro needle was exchanged over a 0.018 micro wire for a transitional 4 [place] micro sheath. The micro sheath was then exchanged over a 0.035 wire for a working 6 [place] vascular sheath. Using the same technique, a second ultrasound-guided puncture of the right common femoral vein was made slightly medial to the first. The micro needle was then exchanged over the same steps for a second working 6 [place] vascular sheath. There are now 2 separate sheaths in the right common femoral vein. Starting with more lateral sheath, a C2 cobra catheter was advanced over a a Bentson wire in used to navigate through the heart and into the left main pulmonary artery. A left main pulmonary arteriogram was performed. Multiple filling defects are identified consistent with multifocal PG. The catheter was then navigated into the left lower lobe pulmonary artery. Repeat arteriography was performed confirming the anatomic  position. A Rosen wire was advanced into the left lower lobe pulmonary artery through the catheter. Attention was turned to the more medial 6 French sheath. Again, a C2 cobra catheter was advanced over a Bentson wire into the heart and navigated through the heart and into the right main pulmonary artery. A right main pulmonary arteriogram was performed. There is significant outflow obstruction with a large clot at the bifurcation into the upper and lower lobe pulmonary arteries. Pressures were measured. The pulmonary arterial pressure was 53/29 for a mean of 30 mm Hg. The catheter was advanced into the right lower lobe pulmonary artery. Contrast injection was performed under fluoroscopy to confirm the anatomic position. A Rosen wire was advanced. Two EKOS 12 cm infusion length ultrasound assisted infusion catheters were then selected in advanced over the wires and positioned in the right and left lower lobe pulmonary arteries. Bilateral pulmonary arterial I cysts was then initiated at a rate of 0.5 milligrams/hour per catheter for a total rate of 1.0 milligrams/hour. The catheters were secured in place. The patient tolerated the procedure well. FINDINGS: 1. Multifocal bilateral pulmonary emboli. 2. Mean main pulmonary artery pressure 30 mm Hg consistent with pulmonary arterial hypertension. IMPRESSION: Successful initiation of bilateral pulmonary artery ultrasound accelerated catheter directed thrombolysis. Signed, Criselda Peaches, MD [facility] Radiology Electronically Signed   By: Jacqulynn Cadet M.D.   On: 02/07/2016 14:50   Ir Angiogram Selective Each Additional Vessel  Result Date: 02/07/2016 INDICATION: 75 year old female with sub massive pulmonary embolus and acute cor pulmonale. EXAM: IR INFUSION THROMBOL ARTERIAL INITIAL (MS); ADDITIONAL ARTERIOGRAPHY; IR ULTRASOUND GUIDANCE VASC ACCESS RIGHT; BILATERAL PULMONARY ARTERIOGRAPHY  COMPARISON:  Chest  CTA 02/06/2016 MEDICATIONS: None. ANESTHESIA/SEDATION: Versed 0.5 mg IV; Fentanyl 50 mcg IV Moderate Sedation Time:  53 minutes The patient was continuously monitored during the procedure by the interventional radiology nurse under my direct supervision. FLUOROSCOPY TIME:  Fluoroscopy Time: 6 minutes 18 seconds (66 mGy). COMPLICATIONS: None immediate. TECHNIQUE: Informed written consent was obtained from the patient after a thorough discussion of the procedural risks, benefits and alternatives. All questions were addressed. Maximal Sterile Barrier Technique was utilized including caps, mask, sterile gowns, sterile gloves, sterile drape, hand hygiene and skin antiseptic. A timeout was performed prior to the initiation of the procedure. The right common femoral vein was interrogated with ultrasound. An image was obtained and stored for the medical record. Local anesthesia was attained by infiltration with 1% lidocaine. A small dermatotomy was made. Under real-time sonographic guidance, the vessel was punctured with a 21 gauge micropuncture needle. Using standard technique, the initial micro needle was exchanged over a 0.018 micro wire for a transitional 4 [place] micro sheath. The micro sheath was then exchanged over a 0.035 wire for a working 6 [place] vascular sheath. Using the same technique, a second ultrasound-guided puncture of the right common femoral vein was made slightly medial to the first. The micro needle was then exchanged over the same steps for a second working 6 [place] vascular sheath. There are now 2 separate sheaths in the right common femoral vein. Starting with more lateral sheath, a C2 cobra catheter was advanced over a a Bentson wire in used to navigate through the heart and into the left main pulmonary artery. A left main pulmonary arteriogram was performed. Multiple filling defects are identified consistent with multifocal PG. The catheter was then navigated into the left lower lobe pulmonary  artery. Repeat arteriography was performed confirming the anatomic position. A Rosen wire was advanced into the left lower lobe pulmonary artery through the catheter. Attention was turned to the more medial 6 French sheath. Again, a C2 cobra catheter was advanced over a Bentson wire into the heart and navigated through the heart and into the right main pulmonary artery. A right main pulmonary arteriogram was performed. There is significant outflow obstruction with a large clot at the bifurcation into the upper and lower lobe pulmonary arteries. Pressures were measured. The pulmonary arterial pressure was 53/29 for a mean of 30 mm Hg. The catheter was advanced into the right lower lobe pulmonary artery. Contrast injection was performed under fluoroscopy to confirm the anatomic position. A Rosen wire was advanced. Two EKOS 12 cm infusion length ultrasound assisted infusion catheters were then selected in advanced over the wires and positioned in the right and left lower lobe pulmonary arteries. Bilateral pulmonary arterial I cysts was then initiated at a rate of 0.5 milligrams/hour per catheter for a total rate of 1.0 milligrams/hour. The catheters were secured in place. The patient tolerated the procedure well. FINDINGS: 1. Multifocal bilateral pulmonary emboli. 2. Mean main pulmonary artery pressure 30 mm Hg consistent with pulmonary arterial hypertension. IMPRESSION: Successful initiation of bilateral pulmonary artery ultrasound accelerated catheter directed thrombolysis. Signed, Criselda Peaches, MD [facility] Radiology Electronically Signed   By: Jacqulynn Cadet M.D.   On: 02/07/2016 14:50   Nm Pulmonary Perf And Vent  Result Date: 02/04/2016 CLINICAL DATA:  Pulmonary hypertension EXAM: NUCLEAR MEDICINE VENTILATION - PERFUSION LUNG SCAN TECHNIQUE: Ventilation images were obtained in multiple projections using inhaled aerosol Tc-10m DTPA. Perfusion images  were obtained in multiple projections  after intravenous injection of Tc-44m MAA. RADIOPHARMACEUTICALS:  30.3 mCi Technetium-75m DTPA aerosol inhalation and 4.10 mCi Technetium-30m MAA IV COMPARISON:  Chest x-ray 02/03/2016 FINDINGS: Ventilation: No focal ventilation defect. Perfusion: There are at least 2 moderate wedge-shaped peripheral defects within the superior and posterior upper right lung. Additional small wedge-shaped perfusion defect posterior left upper lobe. There is cardiomegaly. IMPRESSION: Overall intermediate probability V/Q scan. Electronically Signed   By: Donavan Foil M.D.   On: 02/04/2016 21:31   Ir US Guide Vasc Access Right  Result Date: 02/07/2016 INDICATION: 75 year old female with sub massive pulmonary embolus and acute cor pulmonale. EXAM: IR INFUSION THROMBOL ARTERIAL INITIAL (MS); ADDITIONAL ARTERIOGRAPHY; IR ULTRASOUND GUIDANCE VASC ACCESS RIGHT; BILATERAL PULMONARY ARTERIOGRAPHY COMPARISON:  Chest CTA 02/06/2016 MEDICATIONS: None. ANESTHESIA/SEDATION: Versed 0.5 mg IV; Fentanyl 50 mcg IV Moderate Sedation Time:  53 minutes The patient was continuously monitored during the procedure by the interventional radiology nurse under my direct supervision. FLUOROSCOPY TIME:  Fluoroscopy Time: 6 minutes 18 seconds (66 mGy). COMPLICATIONS: None immediate. TECHNIQUE: Informed written consent was obtained from the patient after a thorough discussion of the procedural risks, benefits and alternatives. All questions were addressed. Maximal Sterile Barrier Technique was utilized including caps, mask, sterile gowns, sterile gloves, sterile drape, hand hygiene and skin antiseptic. A timeout was performed prior to the initiation of the procedure. The right common femoral vein was interrogated with ultrasound. An image was obtained and stored for the medical record. Local anesthesia was attained by infiltration with 1% lidocaine. A small dermatotomy was made. Under real-time sonographic guidance, the  vessel was punctured with a 21 gauge micropuncture needle. Using standard technique, the initial micro needle was exchanged over a 0.018 micro wire for a transitional 4 [place] micro sheath. The micro sheath was then exchanged over a 0.035 wire for a working 6 [place] vascular sheath. Using the same technique, a second ultrasound-guided puncture of the right common femoral vein was made slightly medial to the first. The micro needle was then exchanged over the same steps for a second working 6 [place] vascular sheath. There are now 2 separate sheaths in the right common femoral vein. Starting with more lateral sheath, a C2 cobra catheter was advanced over a a Bentson wire in used to navigate through the heart and into the left main pulmonary artery. A left main pulmonary arteriogram was performed. Multiple filling defects are identified consistent with multifocal PG. The catheter was then navigated into the left lower lobe pulmonary artery. Repeat arteriography was performed confirming the anatomic position. A Rosen wire was advanced into the left lower lobe pulmonary artery through the catheter. Attention was turned to the more medial 6 French sheath. Again, a C2 cobra catheter was advanced over a Bentson wire into the heart and navigated through the heart and into the right main pulmonary artery. A right main pulmonary arteriogram was performed. There is significant outflow obstruction with a large clot at the bifurcation into the upper and lower lobe pulmonary arteries. Pressures were measured. The pulmonary arterial pressure was 53/29 for a mean of 30 mm Hg. The catheter was advanced into the right lower lobe pulmonary artery. Contrast injection was performed under fluoroscopy to confirm the anatomic position. A Rosen wire was advanced. Two EKOS 12 cm infusion length ultrasound assisted infusion catheters were then selected in advanced over the wires and positioned in the right and left lower lobe pulmonary  arteries. Bilateral pulmonary arterial I cysts was then initiated at a rate of 0.5 milligrams/hour per  catheter for a total rate of 1.0 milligrams/hour. The catheters were secured in place. The patient tolerated the procedure well. FINDINGS: 1. Multifocal bilateral pulmonary emboli. 2. Mean main pulmonary artery pressure 30 mm Hg consistent with pulmonary arterial hypertension. IMPRESSION: Successful initiation of bilateral pulmonary artery ultrasound accelerated catheter directed thrombolysis. Signed, Criselda Peaches, MD [facility] Radiology Electronically Signed   By: Jacqulynn Cadet M.D.   On: 02/07/2016 14:50   Dg Chest Port 1 View  Result Date: 02/08/2016 CLINICAL DATA:  75 year old female with sub massive pulmonary embolus status post bilateral pulmonary artery ultrasound accelerated catheter directed thrombolysis yesterday. Initial encounter. EXAM: PORTABLE CHEST 1 VIEW COMPARISON:  Chest CTA 02/06/2016. Chest radiographs 02/03/2016 and earlier. FINDINGS: Portable AP supine view at 0434 hours. Stable cardiomegaly and mediastinal contours. Relatively stable lung volumes compared to 02/03/2016. Allowing for portable technique the lungs are clear. No pneumothorax or pleural effusion. Visualized tracheal air column is within normal limits. IMPRESSION: Stable cardiomegaly and no acute cardiopulmonary abnormality following pulmonary artery catheter directed thrombolysis. Electronically Signed   By: Genevie Ann M.D.   On: 02/08/2016 06:36   Ir Infusion Thrombol Arterial Initial (ms)  Result Date: 02/07/2016 INDICATION: 75 year old female with sub massive pulmonary embolus and acute cor pulmonale. EXAM: IR INFUSION THROMBOL ARTERIAL INITIAL (MS); ADDITIONAL ARTERIOGRAPHY; IR ULTRASOUND GUIDANCE VASC ACCESS RIGHT; BILATERAL PULMONARY ARTERIOGRAPHY COMPARISON:  Chest CTA 02/06/2016 MEDICATIONS: None. ANESTHESIA/SEDATION: Versed 0.5 mg IV; Fentanyl 50 mcg IV  Moderate Sedation Time:  53 minutes The patient was continuously monitored during the procedure by the interventional radiology nurse under my direct supervision. FLUOROSCOPY TIME:  Fluoroscopy Time: 6 minutes 18 seconds (66 mGy). COMPLICATIONS: None immediate. TECHNIQUE: Informed written consent was obtained from the patient after a thorough discussion of the procedural risks, benefits and alternatives. All questions were addressed. Maximal Sterile Barrier Technique was utilized including caps, mask, sterile gowns, sterile gloves, sterile drape, hand hygiene and skin antiseptic. A timeout was performed prior to the initiation of the procedure. The right common femoral vein was interrogated with ultrasound. An image was obtained and stored for the medical record. Local anesthesia was attained by infiltration with 1% lidocaine. A small dermatotomy was made. Under real-time sonographic guidance, the vessel was punctured with a 21 gauge micropuncture needle. Using standard technique, the initial micro needle was exchanged over a 0.018 micro wire for a transitional 4 [place] micro sheath. The micro sheath was then exchanged over a 0.035 wire for a working 6 [place] vascular sheath. Using the same technique, a second ultrasound-guided puncture of the right common femoral vein was made slightly medial to the first. The micro needle was then exchanged over the same steps for a second working 6 [place] vascular sheath. There are now 2 separate sheaths in the right common femoral vein. Starting with more lateral sheath, a C2 cobra catheter was advanced over a a Bentson wire in used to navigate through the heart and into the left main pulmonary artery. A left main pulmonary arteriogram was performed. Multiple filling defects are identified consistent with multifocal PG. The catheter was then navigated into the left lower lobe pulmonary artery. Repeat arteriography was performed confirming the anatomic position. A Rosen wire was  advanced into the left lower lobe pulmonary artery through the catheter. Attention was turned to the more medial 6 [place] sheath. Again, a C2 cobra catheter was advanced over a Bentson wire into the heart and navigated through the heart and into the right main pulmonary  artery. A right main pulmonary arteriogram was performed. There is significant outflow obstruction with a large clot at the bifurcation into the upper and lower lobe pulmonary arteries. Pressures were measured. The pulmonary arterial pressure was 53/29 for a mean of 30 mm Hg. The catheter was advanced into the right lower lobe pulmonary artery. Contrast injection was performed under fluoroscopy to confirm the anatomic position. A Rosen wire was advanced. Two EKOS 12 cm infusion length ultrasound assisted infusion catheters were then selected in advanced over the wires and positioned in the right and left lower lobe pulmonary arteries. Bilateral pulmonary arterial I cysts was then initiated at a rate of 0.5 milligrams/hour per catheter for a total rate of 1.0 milligrams/hour. The catheters were secured in place. The patient tolerated the procedure well. FINDINGS: 1. Multifocal bilateral pulmonary emboli. 2. Mean main pulmonary artery pressure 30 mm Hg consistent with pulmonary arterial hypertension. IMPRESSION: Successful initiation of bilateral pulmonary artery ultrasound accelerated catheter directed thrombolysis. Signed, Criselda Peaches, MD [facility] Radiology Electronically Signed   By: Jacqulynn Cadet M.D.   On: 02/07/2016 14:50   Ir Infusion Thrombol Arterial Initial (ms)  Result Date: 02/07/2016 INDICATION: 75 year old female with sub massive pulmonary embolus and acute cor pulmonale. EXAM: IR INFUSION THROMBOL ARTERIAL INITIAL (MS); ADDITIONAL ARTERIOGRAPHY; IR ULTRASOUND GUIDANCE VASC ACCESS RIGHT; BILATERAL PULMONARY ARTERIOGRAPHY COMPARISON:  Chest CTA 02/06/2016 MEDICATIONS:  None. ANESTHESIA/SEDATION: Versed 0.5 mg IV; Fentanyl 50 mcg IV Moderate Sedation Time:  53 minutes The patient was continuously monitored during the procedure by the interventional radiology nurse under my direct supervision. FLUOROSCOPY TIME:  Fluoroscopy Time: 6 minutes 18 seconds (66 mGy). COMPLICATIONS: None immediate. TECHNIQUE: Informed written consent was obtained from the patient after a thorough discussion of the procedural risks, benefits and alternatives. All questions were addressed. Maximal Sterile Barrier Technique was utilized including caps, mask, sterile gowns, sterile gloves, sterile drape, hand hygiene and skin antiseptic. A timeout was performed prior to the initiation of the procedure. The right common femoral vein was interrogated with ultrasound. An image was obtained and stored for the medical record. Local anesthesia was attained by infiltration with 1% lidocaine. A small dermatotomy was made. Under real-time sonographic guidance, the vessel was punctured with a 21 gauge micropuncture needle. Using standard technique, the initial micro needle was exchanged over a 0.018 micro wire for a transitional 4 [place] micro sheath. The micro sheath was then exchanged over a 0.035 wire for a working 6 [place] vascular sheath. Using the same technique, a second ultrasound-guided puncture of the right common femoral vein was made slightly medial to the first. The micro needle was then exchanged over the same steps for a second working 6 [place] vascular sheath. There are now 2 separate sheaths in the right common femoral vein. Starting with more lateral sheath, a C2 cobra catheter was advanced over a a Bentson wire in used to navigate through the heart and into the left main pulmonary artery. A left main pulmonary arteriogram was performed. Multiple filling defects are identified consistent with multifocal PG. The catheter was then navigated into the left lower lobe pulmonary artery. Repeat arteriography  was performed confirming the anatomic position. A Rosen wire was advanced into the left lower lobe pulmonary artery through the catheter. Attention was turned to the more medial 6 [place] sheath. Again, a C2 cobra catheter was advanced over a Bentson wire into the heart and navigated through the heart and into the right main pulmonary artery. A right  main pulmonary arteriogram was performed. There is significant outflow obstruction with a large clot at the bifurcation into the upper and lower lobe pulmonary arteries. Pressures were measured. The pulmonary arterial pressure was 53/29 for a mean of 30 mm Hg. The catheter was advanced into the right lower lobe pulmonary artery. Contrast injection was performed under fluoroscopy to confirm the anatomic position. A Rosen wire was advanced. Two EKOS 12 cm infusion length ultrasound assisted infusion catheters were then selected in advanced over the wires and positioned in the right and left lower lobe pulmonary arteries. Bilateral pulmonary arterial I cysts was then initiated at a rate of 0.5 milligrams/hour per catheter for a total rate of 1.0 milligrams/hour. The catheters were secured in place. The patient tolerated the procedure well. FINDINGS: 1. Multifocal bilateral pulmonary emboli. 2. Mean main pulmonary artery pressure 30 mm Hg consistent with pulmonary arterial hypertension. IMPRESSION: Successful initiation of bilateral pulmonary artery ultrasound accelerated catheter directed thrombolysis. Signed, Criselda Peaches, MD [facility] Radiology Electronically Signed   By: Jacqulynn Cadet M.D.   On: 02/07/2016 14:50    Labs:  CBC:  Recent Labs  02/07/16 1305 02/07/16 1824 02/07/16 2319 02/08/16 0500  WBC 4.2 4.6 4.3 7.0  HGB 10.1* 9.6* 8.8* 8.5*  HCT 33.6* 31.9* 28.6* 27.6*  PLT 269 250 249 204    COAGS:  Recent Labs  02/03/16 1619  02/06/16 2345 02/07/16 0904 02/07/16 1824 02/08/16 0500   INR 1.2*  --   --   --   --   --   APTT 25  < > 113* 80* 92* 28  < > = values in this interval not displayed.  BMP:  Recent Labs  02/03/16 2046 02/04/16 0741 02/04/16 1633 02/05/16 0301 02/08/16 0038  NA 139 141  --  140 139  K 3.2* 3.6  --  3.7 3.3*  CL 107 109  --  111 110  CO2 21* 22  --  21* 23  GLUCOSE 130* 134*  --  116* 123*  BUN 16 12  --  10 5*  CALCIUM 9.1 8.8*  --  8.7* 8.0*  CREATININE 1.00 0.84 0.79 0.79 0.66  GFRNONAA 54* >60 >60 >60 >60  GFRAA >60 >60 >60 >60 >60    LIVER FUNCTION TESTS: No results for input(s): BILITOT, AST, ALT, ALKPHOS, PROT, ALBUMIN in the last 8760 hours.  Assessment and Plan: 1. Submassive PE, s/p EKOs with early completion secondary to bleeding -patient with massive bleeding around her sheath overnight.  This was removed and bleeding controlled.  She was transfused and her hgb is stable this morning.  All of her vitals are stable and the patient has no complaints. -the bedside RN is going to removed her remaining sheath this morning.   -we will reassess  this site tomorrow.  Electronically Signed: Henreitta Cea 02/08/2016, 10:36 AM   I spent a total of 15 Minutes at the the patient's bedside AND on the patient's hospital floor or unit, greater than 50% of which was counseling/coordinating care for PE

## 2016-02-09 ENCOUNTER — Ambulatory Visit (HOSPITAL_COMMUNITY): Admission: RE | Admit: 2016-02-09 | Payer: PPO | Source: Ambulatory Visit | Admitting: Cardiovascular Disease

## 2016-02-09 DIAGNOSIS — I2692 Saddle embolus of pulmonary artery without acute cor pulmonale: Secondary | ICD-10-CM | POA: Diagnosis not present

## 2016-02-09 DIAGNOSIS — I2729 Other secondary pulmonary hypertension: Secondary | ICD-10-CM | POA: Diagnosis not present

## 2016-02-09 DIAGNOSIS — I2699 Other pulmonary embolism without acute cor pulmonale: Secondary | ICD-10-CM | POA: Diagnosis not present

## 2016-02-09 LAB — BASIC METABOLIC PANEL
Anion gap: 7 (ref 5–15)
BUN: 5 mg/dL — ABNORMAL LOW (ref 6–20)
CALCIUM: 8.3 mg/dL — AB (ref 8.9–10.3)
CO2: 22 mmol/L (ref 22–32)
CREATININE: 0.65 mg/dL (ref 0.44–1.00)
Chloride: 109 mmol/L (ref 101–111)
GFR calc non Af Amer: >60 mL/min (ref >60)
Glucose, Bld: 100 mg/dL — ABNORMAL HIGH (ref 65–99)
Potassium: 3.6 mmol/L (ref 3.5–5.1)
SODIUM: 138 mmol/L (ref 135–145)

## 2016-02-09 LAB — CBC
HCT: 27.3 % — ABNORMAL LOW (ref 36.0–46.0)
Hemoglobin: 8.5 g/dL — ABNORMAL LOW (ref 12.0–15.0)
MCH: 23.5 pg — ABNORMAL LOW (ref 26.0–34.0)
MCHC: 31.1 g/dL (ref 30.0–36.0)
MCV: 75.4 fL — ABNORMAL LOW (ref 78.0–100.0)
PLATELETS: 230 10*3/uL (ref 150–400)
RBC: 3.62 MIL/uL — AB (ref 3.87–5.11)
RDW: 20.8 % — ABNORMAL HIGH (ref 11.5–15.5)
WBC: 7 10*3/uL (ref 4.0–10.5)

## 2016-02-09 LAB — HEPARIN LEVEL (UNFRACTIONATED): Heparin Unfractionated: 0.33 IU/mL (ref 0.30–0.70)

## 2016-02-09 LAB — ANTINUCLEAR ANTIBODIES, IFA: ANTINUCLEAR ANTIBODIES, IFA: NEGATIVE

## 2016-02-09 NOTE — Progress Notes (Signed)
Name: Catherine Snyder MRN: [id number] DOB: 05-Apr-1940    ADMISSION DATE:  02/03/2016 CONSULTATION DATE:  02/06/16   REFERRING MD :  Dr Ellyn Hack, Cardiology  REASON FOR CONSULTATION:  Acute pulmonary embolism with right heart strain  BRIEF PATIENT DESCRIPTION: 75 year old woman with hypertension and treated bladder cancer, admitted for evaluation of severe dyspnea and found to have acute bilateral PE with right heart strain. Being considered for possible catheter directed lysis  SIGNIFICANT EVENTS  11/21 admitted for acute dyspnea, concern for possible CAD  STUDIES:  11/22 left and right catheterization >> nonobstructive coronary artery disease, pulmonary hypertension with estimated PA systolic in the [id number] >> V/q intermediate prob with wedge shaped defects,  11/24 CT-PA >> confirmed B large PE w evidence r heart strain   HISTORY OF PRESENT ILLNESS:  75 y.o.femalewith past medical history of HTN, bladder CA (s/p resection in 06/2015), and family history of CAD who presented to ED 11/21 with DOE and hypoxemia beginning 6 days PTA. Underwent L and R cath given suspicion for CAD. L cath was reassuring, R showed RV strain and PAH. V/q was intermediate prob, wedge shaped perfusion defects. CT-PA confirmed B large PE. PCCM consulted regarding suitability for catheter directed lytics.    SUBJECTIVE:  No bleeding overnight. On heparin drip.  Hungry.      VITAL SIGNS: Temp:  [98.2 F (36.8 C)-99.2 F (37.3 C)] 99.2 F (37.3 C) (11/27 0822) Pulse Rate:  [60-76] 76 (11/26 1800) Resp:  [17-23] 22 (11/26 1800) BP: (104-126)/(62-96) 104/66 (11/26 1800) SpO2:  [95 %-100 %] 98 % (11/26 1800)  PHYSICAL EXAMINATION: General:  Pleasant overwt woman, NAD.  Neuro:  Awake and appropriate, non-focal HEENT:  Op clear, no lesions Cardiovascular:  Regular, 2/6 sys M Lungs:  Distant, decreased at bases.  Abdomen:  benign   Recent Labs Lab 02/05/16 0301 02/08/16 0038 02/09/16 0144    NA 140 139 138  K 3.7 3.3* 3.6  CL 111 110 109  CO2 21* 23 22  BUN 10 5* 5*  CREATININE 0.79 0.66 0.65  GLUCOSE 116* 123* 100*    Recent Labs Lab 02/08/16 1222 02/08/16 1819 02/09/16 0144  HGB 8.7* 9.2* 8.5*  HCT 28.1* 29.7* 27.3*  WBC 6.5 6.3 7.0  PLT 194 213 230   Ir Angiogram Pulmonary Bilateral Selective  Result Date: 02/07/2016 INDICATION: 75 year old female with sub massive pulmonary embolus and acute cor pulmonale. EXAM: IR INFUSION THROMBOL ARTERIAL INITIAL (MS); ADDITIONAL ARTERIOGRAPHY; IR ULTRASOUND GUIDANCE VASC ACCESS RIGHT; BILATERAL PULMONARY ARTERIOGRAPHY COMPARISON:  Chest CTA 02/06/2016 MEDICATIONS: None. ANESTHESIA/SEDATION: Versed 0.5 mg IV; Fentanyl 50 mcg IV Moderate Sedation Time:  53 minutes The patient was continuously monitored during the procedure by the interventional radiology nurse under my direct supervision. FLUOROSCOPY TIME:  Fluoroscopy Time: 6 minutes 18 seconds (66 mGy). COMPLICATIONS: None immediate. TECHNIQUE: Informed written consent was obtained from the patient after a thorough discussion of the procedural risks, benefits and alternatives. All questions were addressed. Maximal Sterile Barrier Technique was utilized including caps, mask, sterile gowns, sterile gloves, sterile drape, hand hygiene and skin antiseptic. A timeout was performed prior to the initiation of the procedure. The right common femoral vein was interrogated with ultrasound. An image was obtained and stored for the medical record. Local anesthesia was attained by infiltration with 1% lidocaine. A small dermatotomy was made. Under real-time sonographic guidance, the vessel was punctured with a 21 gauge micropuncture needle. Using standard technique, the initial micro needle was exchanged over a 0.018  micro wire for a transitional 4 [place] micro sheath. The micro sheath was then exchanged over a 0.035 wire for a working 6 [place] vascular sheath. Using the same technique, a second  ultrasound-guided puncture of the right common femoral vein was made slightly medial to the first. The micro needle was then exchanged over the same steps for a second working 6 [place] vascular sheath. There are now 2 separate sheaths in the right common femoral vein. Starting with more lateral sheath, a C2 cobra catheter was advanced over a a Bentson wire in used to navigate through the heart and into the left main pulmonary artery. A left main pulmonary arteriogram was performed. Multiple filling defects are identified consistent with multifocal PG. The catheter was then navigated into the left lower lobe pulmonary artery. Repeat arteriography was performed confirming the anatomic position. A Rosen wire was advanced into the left lower lobe pulmonary artery through the catheter. Attention was turned to the more medial 6 French sheath. Again, a C2 cobra catheter was advanced over a Bentson wire into the heart and navigated through the heart and into the right main pulmonary artery. A right main pulmonary arteriogram was performed. There is significant outflow obstruction with a large clot at the bifurcation into the upper and lower lobe pulmonary arteries. Pressures were measured. The pulmonary arterial pressure was 53/29 for a mean of 30 mm Hg. The catheter was advanced into the right lower lobe pulmonary artery. Contrast injection was performed under fluoroscopy to confirm the anatomic position. A Rosen wire was advanced. Two EKOS 12 cm infusion length ultrasound assisted infusion catheters were then selected in advanced over the wires and positioned in the right and left lower lobe pulmonary arteries. Bilateral pulmonary arterial I cysts was then initiated at a rate of 0.5 milligrams/hour per catheter for a total rate of 1.0 milligrams/hour. The catheters were secured in place. The patient tolerated the procedure well. FINDINGS: 1. Multifocal bilateral pulmonary emboli. 2. Mean main pulmonary artery pressure 30  mm Hg consistent with pulmonary arterial hypertension. IMPRESSION: Successful initiation of bilateral pulmonary artery ultrasound accelerated catheter directed thrombolysis. Signed, Criselda Peaches, MD [facility] Radiology Electronically Signed   By: Jacqulynn Cadet M.D.   On: 02/07/2016 14:50   Ir Angiogram Selective Each Additional Vessel  Result Date: 02/07/2016 INDICATION: 75 year old female with sub massive pulmonary embolus and acute cor pulmonale. EXAM: IR INFUSION THROMBOL ARTERIAL INITIAL (MS); ADDITIONAL ARTERIOGRAPHY; IR ULTRASOUND GUIDANCE VASC ACCESS RIGHT; BILATERAL PULMONARY ARTERIOGRAPHY COMPARISON:  Chest CTA 02/06/2016 MEDICATIONS: None. ANESTHESIA/SEDATION: Versed 0.5 mg IV; Fentanyl 50 mcg IV Moderate Sedation Time:  53 minutes The patient was continuously monitored during the procedure by the interventional radiology nurse under my direct supervision. FLUOROSCOPY TIME:  Fluoroscopy Time: 6 minutes 18 seconds (66 mGy). COMPLICATIONS: None immediate. TECHNIQUE: Informed written consent was obtained from the patient after a thorough discussion of the procedural risks, benefits and alternatives. All questions were addressed. Maximal Sterile Barrier Technique was utilized including caps, mask, sterile gowns, sterile gloves, sterile drape, hand hygiene and skin antiseptic. A timeout was performed prior to the initiation of the procedure. The right common femoral vein was interrogated with ultrasound. An image was obtained and stored for the medical record. Local anesthesia was attained by infiltration with 1% lidocaine. A small dermatotomy was made. Under real-time sonographic guidance, the vessel was punctured with a 21 gauge micropuncture needle. Using standard technique, the initial micro needle was exchanged over a 0.018 micro wire for  a transitional 4 [place] micro sheath. The micro sheath was then exchanged over a 0.035 wire for a  working 6 [place] vascular sheath. Using the same technique, a second ultrasound-guided puncture of the right common femoral vein was made slightly medial to the first. The micro needle was then exchanged over the same steps for a second working 6 [place] vascular sheath. There are now 2 separate sheaths in the right common femoral vein. Starting with more lateral sheath, a C2 cobra catheter was advanced over a a Bentson wire in used to navigate through the heart and into the left main pulmonary artery. A left main pulmonary arteriogram was performed. Multiple filling defects are identified consistent with multifocal PG. The catheter was then navigated into the left lower lobe pulmonary artery. Repeat arteriography was performed confirming the anatomic position. A Rosen wire was advanced into the left lower lobe pulmonary artery through the catheter. Attention was turned to the more medial 6 French sheath. Again, a C2 cobra catheter was advanced over a Bentson wire into the heart and navigated through the heart and into the right main pulmonary artery. A right main pulmonary arteriogram was performed. There is significant outflow obstruction with a large clot at the bifurcation into the upper and lower lobe pulmonary arteries. Pressures were measured. The pulmonary arterial pressure was 53/29 for a mean of 30 mm Hg. The catheter was advanced into the right lower lobe pulmonary artery. Contrast injection was performed under fluoroscopy to confirm the anatomic position. A Rosen wire was advanced. Two EKOS 12 cm infusion length ultrasound assisted infusion catheters were then selected in advanced over the wires and positioned in the right and left lower lobe pulmonary arteries. Bilateral pulmonary arterial I cysts was then initiated at a rate of 0.5 milligrams/hour per catheter for a total rate of 1.0 milligrams/hour. The catheters were secured in place. The patient tolerated the procedure well. FINDINGS: 1. Multifocal  bilateral pulmonary emboli. 2. Mean main pulmonary artery pressure 30 mm Hg consistent with pulmonary arterial hypertension. IMPRESSION: Successful initiation of bilateral pulmonary artery ultrasound accelerated catheter directed thrombolysis. Signed, Criselda Peaches, MD [facility] Radiology Electronically Signed   By: Jacqulynn Cadet M.D.   On: 02/07/2016 14:50   Ir Angiogram Selective Each Additional Vessel  Result Date: 02/07/2016 INDICATION: 75 year old female with sub massive pulmonary embolus and acute cor pulmonale. EXAM: IR INFUSION THROMBOL ARTERIAL INITIAL (MS); ADDITIONAL ARTERIOGRAPHY; IR ULTRASOUND GUIDANCE VASC ACCESS RIGHT; BILATERAL PULMONARY ARTERIOGRAPHY COMPARISON:  Chest CTA 02/06/2016 MEDICATIONS: None. ANESTHESIA/SEDATION: Versed 0.5 mg IV; Fentanyl 50 mcg IV Moderate Sedation Time:  53 minutes The patient was continuously monitored during the procedure by the interventional radiology nurse under my direct supervision. FLUOROSCOPY TIME:  Fluoroscopy Time: 6 minutes 18 seconds (66 mGy). COMPLICATIONS: None immediate. TECHNIQUE: Informed written consent was obtained from the patient after a thorough discussion of the procedural risks, benefits and alternatives. All questions were addressed. Maximal Sterile Barrier Technique was utilized including caps, mask, sterile gowns, sterile gloves, sterile drape, hand hygiene and skin antiseptic. A timeout was performed prior to the initiation of the procedure. The right common femoral vein was interrogated with ultrasound. An image was obtained and stored for the medical record. Local anesthesia was attained by infiltration with 1% lidocaine. A small dermatotomy was made. Under real-time sonographic guidance, the vessel was punctured with a 21 gauge micropuncture needle. Using standard technique, the initial micro needle was exchanged over a 0.018 micro wire for a transitional 4  [place] micro  sheath. The micro sheath was then exchanged over a 0.035 wire for a working 6 [place] vascular sheath. Using the same technique, a second ultrasound-guided puncture of the right common femoral vein was made slightly medial to the first. The micro needle was then exchanged over the same steps for a second working 6 [place] vascular sheath. There are now 2 separate sheaths in the right common femoral vein. Starting with more lateral sheath, a C2 cobra catheter was advanced over a a Bentson wire in used to navigate through the heart and into the left main pulmonary artery. A left main pulmonary arteriogram was performed. Multiple filling defects are identified consistent with multifocal PG. The catheter was then navigated into the left lower lobe pulmonary artery. Repeat arteriography was performed confirming the anatomic position. A Rosen wire was advanced into the left lower lobe pulmonary artery through the catheter. Attention was turned to the more medial 6 [place] sheath. Again, a C2 cobra catheter was advanced over a Bentson wire into the heart and navigated through the heart and into the right main pulmonary artery. A right main pulmonary arteriogram was performed. There is significant outflow obstruction with a large clot at the bifurcation into the upper and lower lobe pulmonary arteries. Pressures were measured. The pulmonary arterial pressure was 53/29 for a mean of 30 mm Hg. The catheter was advanced into the right lower lobe pulmonary artery. Contrast injection was performed under fluoroscopy to confirm the anatomic position. A Rosen wire was advanced. Two EKOS 12 cm infusion length ultrasound assisted infusion catheters were then selected in advanced over the wires and positioned in the right and left lower lobe pulmonary arteries. Bilateral pulmonary arterial I cysts was then initiated at a rate of 0.5 milligrams/hour per catheter for a total rate of 1.0 milligrams/hour. The catheters were secured in place.  The patient tolerated the procedure well. FINDINGS: 1. Multifocal bilateral pulmonary emboli. 2. Mean main pulmonary artery pressure 30 mm Hg consistent with pulmonary arterial hypertension. IMPRESSION: Successful initiation of bilateral pulmonary artery ultrasound accelerated catheter directed thrombolysis. Signed, Criselda Peaches, MD [facility] Radiology Electronically Signed   By: Jacqulynn Cadet M.D.   On: 02/07/2016 14:50   Ir US Guide Vasc Access Right  Result Date: 02/07/2016 INDICATION: 75 year old female with sub massive pulmonary embolus and acute cor pulmonale. EXAM: IR INFUSION THROMBOL ARTERIAL INITIAL (MS); ADDITIONAL ARTERIOGRAPHY; IR ULTRASOUND GUIDANCE VASC ACCESS RIGHT; BILATERAL PULMONARY ARTERIOGRAPHY COMPARISON:  Chest CTA 02/06/2016 MEDICATIONS: None. ANESTHESIA/SEDATION: Versed 0.5 mg IV; Fentanyl 50 mcg IV Moderate Sedation Time:  53 minutes The patient was continuously monitored during the procedure by the interventional radiology nurse under my direct supervision. FLUOROSCOPY TIME:  Fluoroscopy Time: 6 minutes 18 seconds (66 mGy). COMPLICATIONS: None immediate. TECHNIQUE: Informed written consent was obtained from the patient after a thorough discussion of the procedural risks, benefits and alternatives. All questions were addressed. Maximal Sterile Barrier Technique was utilized including caps, mask, sterile gowns, sterile gloves, sterile drape, hand hygiene and skin antiseptic. A timeout was performed prior to the initiation of the procedure. The right common femoral vein was interrogated with ultrasound. An image was obtained and stored for the medical record. Local anesthesia was attained by infiltration with 1% lidocaine. A small dermatotomy was made. Under real-time sonographic guidance, the vessel was punctured with a 21 gauge micropuncture needle. Using standard technique, the initial micro needle was exchanged over a  0.018 micro wire for a transitional 4 [place] micro  sheath. The micro sheath was then exchanged over a 0.035 wire for a working 6 [place] vascular sheath. Using the same technique, a second ultrasound-guided puncture of the right common femoral vein was made slightly medial to the first. The micro needle was then exchanged over the same steps for a second working 6 [place] vascular sheath. There are now 2 separate sheaths in the right common femoral vein. Starting with more lateral sheath, a C2 cobra catheter was advanced over a a Bentson wire in used to navigate through the heart and into the left main pulmonary artery. A left main pulmonary arteriogram was performed. Multiple filling defects are identified consistent with multifocal PG. The catheter was then navigated into the left lower lobe pulmonary artery. Repeat arteriography was performed confirming the anatomic position. A Rosen wire was advanced into the left lower lobe pulmonary artery through the catheter. Attention was turned to the more medial 6 [place] sheath. Again, a C2 cobra catheter was advanced over a Bentson wire into the heart and navigated through the heart and into the right main pulmonary artery. A right main pulmonary arteriogram was performed. There is significant outflow obstruction with a large clot at the bifurcation into the upper and lower lobe pulmonary arteries. Pressures were measured. The pulmonary arterial pressure was 53/29 for a mean of 30 mm Hg. The catheter was advanced into the right lower lobe pulmonary artery. Contrast injection was performed under fluoroscopy to confirm the anatomic position. A Rosen wire was advanced. Two EKOS 12 cm infusion length ultrasound assisted infusion catheters were then selected in advanced over the wires and positioned in the right and left lower lobe pulmonary arteries. Bilateral pulmonary arterial I cysts was then initiated at a rate of 0.5 milligrams/hour per catheter for a total rate of 1.0  milligrams/hour. The catheters were secured in place. The patient tolerated the procedure well. FINDINGS: 1. Multifocal bilateral pulmonary emboli. 2. Mean main pulmonary artery pressure 30 mm Hg consistent with pulmonary arterial hypertension. IMPRESSION: Successful initiation of bilateral pulmonary artery ultrasound accelerated catheter directed thrombolysis. Signed, Criselda Peaches, MD [facility] Radiology Electronically Signed   By: Jacqulynn Cadet M.D.   On: 02/07/2016 14:50   Dg Chest Port 1 View  Result Date: 02/08/2016 CLINICAL DATA:  75 year old female with sub massive pulmonary embolus status post bilateral pulmonary artery ultrasound accelerated catheter directed thrombolysis yesterday. Initial encounter. EXAM: PORTABLE CHEST 1 VIEW COMPARISON:  Chest CTA 02/06/2016. Chest radiographs 02/03/2016 and earlier. FINDINGS: Portable AP supine view at 0434 hours. Stable cardiomegaly and mediastinal contours. Relatively stable lung volumes compared to 02/03/2016. Allowing for portable technique the lungs are clear. No pneumothorax or pleural effusion. Visualized tracheal air column is within normal limits. IMPRESSION: Stable cardiomegaly and no acute cardiopulmonary abnormality following pulmonary artery catheter directed thrombolysis. Electronically Signed   By: Genevie Ann M.D.   On: 02/08/2016 06:36   Ir Infusion Thrombol Arterial Initial (ms)  Result Date: 02/07/2016 INDICATION: 75 year old female with sub massive pulmonary embolus and acute cor pulmonale. EXAM: IR INFUSION THROMBOL ARTERIAL INITIAL (MS); ADDITIONAL ARTERIOGRAPHY; IR ULTRASOUND GUIDANCE VASC ACCESS RIGHT; BILATERAL PULMONARY ARTERIOGRAPHY COMPARISON:  Chest CTA 02/06/2016 MEDICATIONS: None. ANESTHESIA/SEDATION: Versed 0.5 mg IV; Fentanyl 50 mcg IV Moderate Sedation Time:  53 minutes The patient was continuously monitored during the procedure by the interventional radiology nurse  under my direct supervision. FLUOROSCOPY TIME:  Fluoroscopy Time: 6 minutes 18 seconds (66 mGy). COMPLICATIONS: None immediate. TECHNIQUE: Informed written consent was obtained from  the patient after a thorough discussion of the procedural risks, benefits and alternatives. All questions were addressed. Maximal Sterile Barrier Technique was utilized including caps, mask, sterile gowns, sterile gloves, sterile drape, hand hygiene and skin antiseptic. A timeout was performed prior to the initiation of the procedure. The right common femoral vein was interrogated with ultrasound. An image was obtained and stored for the medical record. Local anesthesia was attained by infiltration with 1% lidocaine. A small dermatotomy was made. Under real-time sonographic guidance, the vessel was punctured with a 21 gauge micropuncture needle. Using standard technique, the initial micro needle was exchanged over a 0.018 micro wire for a transitional 4 [place] micro sheath. The micro sheath was then exchanged over a 0.035 wire for a working 6 [place] vascular sheath. Using the same technique, a second ultrasound-guided puncture of the right common femoral vein was made slightly medial to the first. The micro needle was then exchanged over the same steps for a second working 6 [place] vascular sheath. There are now 2 separate sheaths in the right common femoral vein. Starting with more lateral sheath, a C2 cobra catheter was advanced over a a Bentson wire in used to navigate through the heart and into the left main pulmonary artery. A left main pulmonary arteriogram was performed. Multiple filling defects are identified consistent with multifocal PG. The catheter was then navigated into the left lower lobe pulmonary artery. Repeat arteriography was performed confirming the anatomic position. A Rosen wire was advanced into the left lower lobe pulmonary artery through the catheter. Attention was turned to the more medial 6 [place] sheath.  Again, a C2 cobra catheter was advanced over a Bentson wire into the heart and navigated through the heart and into the right main pulmonary artery. A right main pulmonary arteriogram was performed. There is significant outflow obstruction with a large clot at the bifurcation into the upper and lower lobe pulmonary arteries. Pressures were measured. The pulmonary arterial pressure was 53/29 for a mean of 30 mm Hg. The catheter was advanced into the right lower lobe pulmonary artery. Contrast injection was performed under fluoroscopy to confirm the anatomic position. A Rosen wire was advanced. Two EKOS 12 cm infusion length ultrasound assisted infusion catheters were then selected in advanced over the wires and positioned in the right and left lower lobe pulmonary arteries. Bilateral pulmonary arterial I cysts was then initiated at a rate of 0.5 milligrams/hour per catheter for a total rate of 1.0 milligrams/hour. The catheters were secured in place. The patient tolerated the procedure well. FINDINGS: 1. Multifocal bilateral pulmonary emboli. 2. Mean main pulmonary artery pressure 30 mm Hg consistent with pulmonary arterial hypertension. IMPRESSION: Successful initiation of bilateral pulmonary artery ultrasound accelerated catheter directed thrombolysis. Signed, Criselda Peaches, MD [facility] Radiology Electronically Signed   By: Jacqulynn Cadet M.D.   On: 02/07/2016 14:50   Ir Infusion Thrombol Arterial Initial (ms)  Result Date: 02/07/2016 INDICATION: 75 year old female with sub massive pulmonary embolus and acute cor pulmonale. EXAM: IR INFUSION THROMBOL ARTERIAL INITIAL (MS); ADDITIONAL ARTERIOGRAPHY; IR ULTRASOUND GUIDANCE VASC ACCESS RIGHT; BILATERAL PULMONARY ARTERIOGRAPHY COMPARISON:  Chest CTA 02/06/2016 MEDICATIONS: None. ANESTHESIA/SEDATION: Versed 0.5 mg IV; Fentanyl 50 mcg IV Moderate Sedation Time:  53 minutes The patient was continuously  monitored during the procedure by the interventional radiology nurse under my direct supervision. FLUOROSCOPY TIME:  Fluoroscopy Time: 6 minutes 18 seconds (66 mGy). COMPLICATIONS: None immediate. TECHNIQUE: Informed written consent was obtained from the patient after  a thorough discussion of the procedural risks, benefits and alternatives. All questions were addressed. Maximal Sterile Barrier Technique was utilized including caps, mask, sterile gowns, sterile gloves, sterile drape, hand hygiene and skin antiseptic. A timeout was performed prior to the initiation of the procedure. The right common femoral vein was interrogated with ultrasound. An image was obtained and stored for the medical record. Local anesthesia was attained by infiltration with 1% lidocaine. A small dermatotomy was made. Under real-time sonographic guidance, the vessel was punctured with a 21 gauge micropuncture needle. Using standard technique, the initial micro needle was exchanged over a 0.018 micro wire for a transitional 4 [place] micro sheath. The micro sheath was then exchanged over a 0.035 wire for a working 6 [place] vascular sheath. Using the same technique, a second ultrasound-guided puncture of the right common femoral vein was made slightly medial to the first. The micro needle was then exchanged over the same steps for a second working 6 [place] vascular sheath. There are now 2 separate sheaths in the right common femoral vein. Starting with more lateral sheath, a C2 cobra catheter was advanced over a a Bentson wire in used to navigate through the heart and into the left main pulmonary artery. A left main pulmonary arteriogram was performed. Multiple filling defects are identified consistent with multifocal PG. The catheter was then navigated into the left lower lobe pulmonary artery. Repeat arteriography was performed confirming the anatomic position. A Rosen wire was advanced into the left lower lobe pulmonary artery through the  catheter. Attention was turned to the more medial 6 French sheath. Again, a C2 cobra catheter was advanced over a Bentson wire into the heart and navigated through the heart and into the right main pulmonary artery. A right main pulmonary arteriogram was performed. There is significant outflow obstruction with a large clot at the bifurcation into the upper and lower lobe pulmonary arteries. Pressures were measured. The pulmonary arterial pressure was 53/29 for a mean of 30 mm Hg. The catheter was advanced into the right lower lobe pulmonary artery. Contrast injection was performed under fluoroscopy to confirm the anatomic position. A Rosen wire was advanced. Two EKOS 12 cm infusion length ultrasound assisted infusion catheters were then selected in advanced over the wires and positioned in the right and left lower lobe pulmonary arteries. Bilateral pulmonary arterial I cysts was then initiated at a rate of 0.5 milligrams/hour per catheter for a total rate of 1.0 milligrams/hour. The catheters were secured in place. The patient tolerated the procedure well. FINDINGS: 1. Multifocal bilateral pulmonary emboli. 2. Mean main pulmonary artery pressure 30 mm Hg consistent with pulmonary arterial hypertension. IMPRESSION: Successful initiation of bilateral pulmonary artery ultrasound accelerated catheter directed thrombolysis. Signed, Criselda Peaches, MD [facility] Electronically Signed   By: Jacqulynn Cadet M.D.   On: 02/07/2016 14:50    ASSESSMENT  Acute bilateral pulmonary emboli. 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 Had bleeding per catheter site requiring 2upRBC 11/26. No signs of bleeding x 24 hrs.  Acute respiratory failure with hypoxemia. Clinically improved. On [place] now.  Secondary pulmonary hypertension with RV strain HTN  Plan : Transfer to telemetry.  On heparin drip. Need to transition ideally  to lovenox (with h/o CA) but not sure if insurance will cover.  If not lovenox, need to transition to [facility]. I think she needs to be on [facility] lifetime as PE is unprovoked but need to discuss  with pt pros and cons.  Advance diet.  Observe BP while on norvasc. May not need lisinopril as BP OK now.  Pt is DNR. Plan d/w pt.  TRH Dr. Thereasa Solo aware of transfer.  TRH as primary starting 11/28, PCCM off.   Monica Becton, MD 02/09/2016, 8:26 AM [facility] Pager [phone] After 3 pm or if no answer, call [phone]

## 2016-02-09 NOTE — Progress Notes (Addendum)
Patient ambulated to bathroom with minimal assistance. Daughter and great granddaughter present in the room. Daughter expressed concern about her 02 level. Retook it, is at 32, patient reports no difficulty breathing. Will continue to monitor. No other needs at this time, call light within reach.

## 2016-02-09 NOTE — Progress Notes (Signed)
Patient Name: Catherine Snyder Date of Encounter: 02/09/2016  Primary Cardiologist: Dr. Lorie Phenix Problem List     Principal Problem:   Pulmonary hypertension due to thromboembolism [facility]) Active Problems:   Essential hypertension   Demand ischemia of myocardium (HCC) - Not NSTEMI   Elevated troponin   Dyspnea on exertion   Coronary artery disease, non-occlusive   Acute saddle pulmonary embolism without acute cor pulmonale (HCC)    Subjective   No complaints today. Feels like dancing. No chest pain or SOB.    Inpatient Medications    Scheduled Meds: . amLODipine  10 mg Oral Daily  . aspirin  81 mg Oral Daily  . atorvastatin  80 mg Oral q1800  . sodium chloride flush  3 mL Intravenous Q12H  . sodium chloride flush  3 mL Intravenous Q12H   Continuous Infusions: . sodium chloride    . sodium chloride    . heparin 1,000 Units/hr (02/08/16 1903)   PRN Meds: sodium chloride, sodium chloride, acetaminophen, nitroGLYCERIN, ondansetron (ZOFRAN) IV, sodium chloride flush, sodium chloride flush, technetium TC 18M diethylenetriame-pentaacetic acid   Vital Signs    Vitals:   02/08/16 1600 02/08/16 1800 02/08/16 2000 02/09/16 0822  BP: 113/76 104/66    Pulse: 66 76    Resp: 19 (!) 22    Temp: 98.3 F (36.8 C)  98.2 F (36.8 C) 99.2 F (37.3 C)  TempSrc: Oral  Oral Oral  SpO2: 97% 98%    Weight:      Height:        Intake/Output Summary (Last 24 hours) at 02/09/16 0925 Last data filed at 02/09/16 0558  Gross per 24 hour  Intake          1726.68 ml  Output             1650 ml  Net            76.68 ml   Filed Weights   02/05/16 0539 02/06/16 0600 02/07/16 0534  Weight: 168 lb 4.8 oz (76.3 kg) 166 lb 9.6 oz (75.6 kg) 165 lb (74.8 kg)    Physical Exam   Gen.: Appears comfortable. HEENT: Conjunctiva and lids normal, oropharynx clear. Neck: Supple, no elevated JVP or carotid bruits, no thyromegaly. Lungs: Clear to auscultation, nonlabored breathing  at rest. Cardiac: Regular rate and rhythm, no S3, no pericardial rub. Abdomen: Soft, nontender, bowel sounds present, no guarding or rebound. Extremities: No pitting edema, distal pulses 2+.  Labs    CBC  Recent Labs  02/08/16 1819 02/09/16 0144  WBC 6.3 7.0  HGB 9.2* 8.5*  HCT 29.7* 27.3*  MCV 75.8* 75.4*  PLT 213 [id number]   Basic Metabolic Panel  Recent Labs  02/08/16 0038 02/09/16 0144  NA 139 138  K 3.3* 3.6  CL 110 109  CO2 23 22  GLUCOSE 123* 100*  BUN 5* 5*  CREATININE 0.66 0.65  CALCIUM 8.0* 8.3*    Telemetry    I personally reviewed telemetry monitoring which shows sinus rhythm.  ECG    I personally reviewed the tracing from 02/04/2016 which shows sinus rhythm with R in lead V1 and V2, nonspecific ST-T changes.   Radiology    Ir Angiogram Pulmonary Bilateral Selective  Result Date: 02/07/2016 INDICATION: 75 year old female with sub massive pulmonary embolus and acute cor pulmonale. EXAM: IR INFUSION THROMBOL ARTERIAL INITIAL (MS); ADDITIONAL ARTERIOGRAPHY; IR ULTRASOUND GUIDANCE VASC ACCESS RIGHT; BILATERAL PULMONARY ARTERIOGRAPHY COMPARISON:  Chest CTA 02/06/2016 MEDICATIONS: None.  ANESTHESIA/SEDATION: Versed 0.5 mg IV; Fentanyl 50 mcg IV Moderate Sedation Time:  53 minutes The patient was continuously monitored during the procedure by the interventional radiology nurse under my direct supervision. FLUOROSCOPY TIME:  Fluoroscopy Time: 6 minutes 18 seconds (66 mGy). COMPLICATIONS: None immediate. TECHNIQUE: Informed written consent was obtained from the patient after a thorough discussion of the procedural risks, benefits and alternatives. All questions were addressed. Maximal Sterile Barrier Technique was utilized including caps, mask, sterile gowns, sterile gloves, sterile drape, hand hygiene and skin antiseptic. A timeout was performed prior to the initiation of the procedure. The right common femoral vein was interrogated with ultrasound. An image was obtained  and stored for the medical record. Local anesthesia was attained by infiltration with 1% lidocaine. A small dermatotomy was made. Under real-time sonographic guidance, the vessel was punctured with a 21 gauge micropuncture needle. Using standard technique, the initial micro needle was exchanged over a 0.018 micro wire for a transitional 4 [place] micro sheath. The micro sheath was then exchanged over a 0.035 wire for a working 6 [place] vascular sheath. Using the same technique, a second ultrasound-guided puncture of the right common femoral vein was made slightly medial to the first. The micro needle was then exchanged over the same steps for a second working 6 [place] vascular sheath. There are now 2 separate sheaths in the right common femoral vein. Starting with more lateral sheath, a C2 cobra catheter was advanced over a a Bentson wire in used to navigate through the heart and into the left main pulmonary artery. A left main pulmonary arteriogram was performed. Multiple filling defects are identified consistent with multifocal PG. The catheter was then navigated into the left lower lobe pulmonary artery. Repeat arteriography was performed confirming the anatomic position. A Rosen wire was advanced into the left lower lobe pulmonary artery through the catheter. Attention was turned to the more medial 6 French sheath. Again, a C2 cobra catheter was advanced over a Bentson wire into the heart and navigated through the heart and into the right main pulmonary artery. A right main pulmonary arteriogram was performed. There is significant outflow obstruction with a large clot at the bifurcation into the upper and lower lobe pulmonary arteries. Pressures were measured. The pulmonary arterial pressure was 53/29 for a mean of 30 mm Hg. The catheter was advanced into the right lower lobe pulmonary artery. Contrast injection was performed under fluoroscopy to confirm the anatomic position. A Rosen wire was advanced. Two EKOS  12 cm infusion length ultrasound assisted infusion catheters were then selected in advanced over the wires and positioned in the right and left lower lobe pulmonary arteries. Bilateral pulmonary arterial I cysts was then initiated at a rate of 0.5 milligrams/hour per catheter for a total rate of 1.0 milligrams/hour. The catheters were secured in place. The patient tolerated the procedure well. FINDINGS: 1. Multifocal bilateral pulmonary emboli. 2. Mean main pulmonary artery pressure 30 mm Hg consistent with pulmonary arterial hypertension. IMPRESSION: Successful initiation of bilateral pulmonary artery ultrasound accelerated catheter directed thrombolysis. Signed, Criselda Peaches, MD [facility] Electronically Signed   By: Jacqulynn Cadet M.D.   On: 02/07/2016 14:50   Ir Angiogram Selective Each Additional Vessel  Result Date: 02/07/2016 INDICATION: 75 year old female with sub massive pulmonary embolus and acute cor pulmonale. EXAM: IR INFUSION THROMBOL ARTERIAL INITIAL (MS); ADDITIONAL ARTERIOGRAPHY; IR ULTRASOUND GUIDANCE VASC ACCESS RIGHT; BILATERAL PULMONARY ARTERIOGRAPHY COMPARISON:  Chest CTA 02/06/2016 MEDICATIONS: None. ANESTHESIA/SEDATION: Versed 0.5  mg IV; Fentanyl 50 mcg IV Moderate Sedation Time:  53 minutes The patient was continuously monitored during the procedure by the interventional radiology nurse under my direct supervision. FLUOROSCOPY TIME:  Fluoroscopy Time: 6 minutes 18 seconds (66 mGy). COMPLICATIONS: None immediate. TECHNIQUE: Informed written consent was obtained from the patient after a thorough discussion of the procedural risks, benefits and alternatives. All questions were addressed. Maximal Sterile Barrier Technique was utilized including caps, mask, sterile gowns, sterile gloves, sterile drape, hand hygiene and skin antiseptic. A timeout was performed prior to the initiation of the procedure. The right common  femoral vein was interrogated with ultrasound. An image was obtained and stored for the medical record. Local anesthesia was attained by infiltration with 1% lidocaine. A small dermatotomy was made. Under real-time sonographic guidance, the vessel was punctured with a 21 gauge micropuncture needle. Using standard technique, the initial micro needle was exchanged over a 0.018 micro wire for a transitional 4 [place] micro sheath. The micro sheath was then exchanged over a 0.035 wire for a working 6 [place] vascular sheath. Using the same technique, a second ultrasound-guided puncture of the right common femoral vein was made slightly medial to the first. The micro needle was then exchanged over the same steps for a second working 6 [place] vascular sheath. There are now 2 separate sheaths in the right common femoral vein. Starting with more lateral sheath, a C2 cobra catheter was advanced over a a Bentson wire in used to navigate through the heart and into the left main pulmonary artery. A left main pulmonary arteriogram was performed. Multiple filling defects are identified consistent with multifocal PG. The catheter was then navigated into the left lower lobe pulmonary artery. Repeat arteriography was performed confirming the anatomic position. A Rosen wire was advanced into the left lower lobe pulmonary artery through the catheter. Attention was turned to the more medial 6 French sheath. Again, a C2 cobra catheter was advanced over a Bentson wire into the heart and navigated through the heart and into the right main pulmonary artery. A right main pulmonary arteriogram was performed. There is significant outflow obstruction with a large clot at the bifurcation into the upper and lower lobe pulmonary arteries. Pressures were measured. The pulmonary arterial pressure was 53/29 for a mean of 30 mm Hg. The catheter was advanced into the right lower lobe pulmonary artery. Contrast injection was performed under fluoroscopy  to confirm the anatomic position. A Rosen wire was advanced. Two EKOS 12 cm infusion length ultrasound assisted infusion catheters were then selected in advanced over the wires and positioned in the right and left lower lobe pulmonary arteries. Bilateral pulmonary arterial I cysts was then initiated at a rate of 0.5 milligrams/hour per catheter for a total rate of 1.0 milligrams/hour. The catheters were secured in place. The patient tolerated the procedure well. FINDINGS: 1. Multifocal bilateral pulmonary emboli. 2. Mean main pulmonary artery pressure 30 mm Hg consistent with pulmonary arterial hypertension. IMPRESSION: Successful initiation of bilateral pulmonary artery ultrasound accelerated catheter directed thrombolysis. Signed, Criselda Peaches, MD [facility] Electronically Signed   By: Jacqulynn Cadet M.D.   On: 02/07/2016 14:50   Ir Angiogram Selective Each Additional Vessel  Result Date: 02/07/2016 INDICATION: 75 year old female with sub massive pulmonary embolus and acute cor pulmonale. EXAM: IR INFUSION THROMBOL ARTERIAL INITIAL (MS); ADDITIONAL ARTERIOGRAPHY; IR ULTRASOUND GUIDANCE VASC ACCESS RIGHT; BILATERAL PULMONARY ARTERIOGRAPHY COMPARISON:  Chest CTA 02/06/2016 MEDICATIONS: None. ANESTHESIA/SEDATION: Versed 0.5 mg IV; Fentanyl  50 mcg IV Moderate Sedation Time:  53 minutes The patient was continuously monitored during the procedure by the interventional radiology nurse under my direct supervision. FLUOROSCOPY TIME:  Fluoroscopy Time: 6 minutes 18 seconds (66 mGy). COMPLICATIONS: None immediate. TECHNIQUE: Informed written consent was obtained from the patient after a thorough discussion of the procedural risks, benefits and alternatives. All questions were addressed. Maximal Sterile Barrier Technique was utilized including caps, mask, sterile gowns, sterile gloves, sterile drape, hand hygiene and skin antiseptic. A timeout was  performed prior to the initiation of the procedure. The right common femoral vein was interrogated with ultrasound. An image was obtained and stored for the medical record. Local anesthesia was attained by infiltration with 1% lidocaine. A small dermatotomy was made. Under real-time sonographic guidance, the vessel was punctured with a 21 gauge micropuncture needle. Using standard technique, the initial micro needle was exchanged over a 0.018 micro wire for a transitional 4 [place] micro sheath. The micro sheath was then exchanged over a 0.035 wire for a working 6 [place] vascular sheath. Using the same technique, a second ultrasound-guided puncture of the right common femoral vein was made slightly medial to the first. The micro needle was then exchanged over the same steps for a second working 6 [place] vascular sheath. There are now 2 separate sheaths in the right common femoral vein. Starting with more lateral sheath, a C2 cobra catheter was advanced over a a Bentson wire in used to navigate through the heart and into the left main pulmonary artery. A left main pulmonary arteriogram was performed. Multiple filling defects are identified consistent with multifocal PG. The catheter was then navigated into the left lower lobe pulmonary artery. Repeat arteriography was performed confirming the anatomic position. A Rosen wire was advanced into the left lower lobe pulmonary artery through the catheter. Attention was turned to the more medial 6 French sheath. Again, a C2 cobra catheter was advanced over a Bentson wire into the heart and navigated through the heart and into the right main pulmonary artery. A right main pulmonary arteriogram was performed. There is significant outflow obstruction with a large clot at the bifurcation into the upper and lower lobe pulmonary arteries. Pressures were measured. The pulmonary arterial pressure was 53/29 for a mean of 30 mm Hg. The catheter was advanced into the right lower lobe  pulmonary artery. Contrast injection was performed under fluoroscopy to confirm the anatomic position. A Rosen wire was advanced. Two EKOS 12 cm infusion length ultrasound assisted infusion catheters were then selected in advanced over the wires and positioned in the right and left lower lobe pulmonary arteries. Bilateral pulmonary arterial I cysts was then initiated at a rate of 0.5 milligrams/hour per catheter for a total rate of 1.0 milligrams/hour. The catheters were secured in place. The patient tolerated the procedure well. FINDINGS: 1. Multifocal bilateral pulmonary emboli. 2. Mean main pulmonary artery pressure 30 mm Hg consistent with pulmonary arterial hypertension. IMPRESSION: Successful initiation of bilateral pulmonary artery ultrasound accelerated catheter directed thrombolysis. Signed, Criselda Peaches, MD [facility] Electronically Signed   By: Jacqulynn Cadet M.D.   On: 02/07/2016 14:50   Ir US Guide Vasc Access Right  Result Date: 02/07/2016 INDICATION: 75 year old female with sub massive pulmonary embolus and acute cor pulmonale. EXAM: IR INFUSION THROMBOL ARTERIAL INITIAL (MS); ADDITIONAL ARTERIOGRAPHY; IR ULTRASOUND GUIDANCE VASC ACCESS RIGHT; BILATERAL PULMONARY ARTERIOGRAPHY COMPARISON:  Chest CTA 02/06/2016 MEDICATIONS: None. ANESTHESIA/SEDATION: Versed 0.5 mg IV; Fentanyl 50 mcg IV  Moderate Sedation Time:  53 minutes The patient was continuously monitored during the procedure by the interventional radiology nurse under my direct supervision. FLUOROSCOPY TIME:  Fluoroscopy Time: 6 minutes 18 seconds (66 mGy). COMPLICATIONS: None immediate. TECHNIQUE: Informed written consent was obtained from the patient after a thorough discussion of the procedural risks, benefits and alternatives. All questions were addressed. Maximal Sterile Barrier Technique was utilized including caps, mask, sterile gowns, sterile gloves, sterile drape,  hand hygiene and skin antiseptic. A timeout was performed prior to the initiation of the procedure. The right common femoral vein was interrogated with ultrasound. An image was obtained and stored for the medical record. Local anesthesia was attained by infiltration with 1% lidocaine. A small dermatotomy was made. Under real-time sonographic guidance, the vessel was punctured with a 21 gauge micropuncture needle. Using standard technique, the initial micro needle was exchanged over a 0.018 micro wire for a transitional 4 [place] micro sheath. The micro sheath was then exchanged over a 0.035 wire for a working 6 [place] vascular sheath. Using the same technique, a second ultrasound-guided puncture of the right common femoral vein was made slightly medial to the first. The micro needle was then exchanged over the same steps for a second working 6 [place] vascular sheath. There are now 2 separate sheaths in the right common femoral vein. Starting with more lateral sheath, a C2 cobra catheter was advanced over a a Bentson wire in used to navigate through the heart and into the left main pulmonary artery. A left main pulmonary arteriogram was performed. Multiple filling defects are identified consistent with multifocal PG. The catheter was then navigated into the left lower lobe pulmonary artery. Repeat arteriography was performed confirming the anatomic position. A Rosen wire was advanced into the left lower lobe pulmonary artery through the catheter. Attention was turned to the more medial 6 French sheath. Again, a C2 cobra catheter was advanced over a Bentson wire into the heart and navigated through the heart and into the right main pulmonary artery. A right main pulmonary arteriogram was performed. There is significant outflow obstruction with a large clot at the bifurcation into the upper and lower lobe pulmonary arteries. Pressures were measured. The pulmonary arterial pressure was 53/29 for a mean of 30 mm Hg. The  catheter was advanced into the right lower lobe pulmonary artery. Contrast injection was performed under fluoroscopy to confirm the anatomic position. A Rosen wire was advanced. Two EKOS 12 cm infusion length ultrasound assisted infusion catheters were then selected in advanced over the wires and positioned in the right and left lower lobe pulmonary arteries. Bilateral pulmonary arterial I cysts was then initiated at a rate of 0.5 milligrams/hour per catheter for a total rate of 1.0 milligrams/hour. The catheters were secured in place. The patient tolerated the procedure well. FINDINGS: 1. Multifocal bilateral pulmonary emboli. 2. Mean main pulmonary artery pressure 30 mm Hg consistent with pulmonary arterial hypertension. IMPRESSION: Successful initiation of bilateral pulmonary artery ultrasound accelerated catheter directed thrombolysis. Signed, Criselda Peaches, MD [facility] Electronically Signed   By: Jacqulynn Cadet M.D.   On: 02/07/2016 14:50   Dg Chest Port 1 View  Result Date: 02/08/2016 CLINICAL DATA:  75 year old female with sub massive pulmonary embolus status post bilateral pulmonary artery ultrasound accelerated catheter directed thrombolysis yesterday. Initial encounter. EXAM: PORTABLE CHEST 1 VIEW COMPARISON:  Chest CTA 02/06/2016. Chest radiographs 02/03/2016 and earlier. FINDINGS: Portable AP supine view at 0434 hours. Stable cardiomegaly and mediastinal  contours. Relatively stable lung volumes compared to 02/03/2016. Allowing for portable technique the lungs are clear. No pneumothorax or pleural effusion. Visualized tracheal air column is within normal limits. IMPRESSION: Stable cardiomegaly and no acute cardiopulmonary abnormality following pulmonary artery catheter directed thrombolysis. Electronically Signed   By: Genevie Ann M.D.   On: 02/08/2016 06:36   Ir Infusion Thrombol Arterial Initial (ms)  Result Date:  02/07/2016 INDICATION: 75 year old female with sub massive pulmonary embolus and acute cor pulmonale. EXAM: IR INFUSION THROMBOL ARTERIAL INITIAL (MS); ADDITIONAL ARTERIOGRAPHY; IR ULTRASOUND GUIDANCE VASC ACCESS RIGHT; BILATERAL PULMONARY ARTERIOGRAPHY COMPARISON:  Chest CTA 02/06/2016 MEDICATIONS: None. ANESTHESIA/SEDATION: Versed 0.5 mg IV; Fentanyl 50 mcg IV Moderate Sedation Time:  53 minutes The patient was continuously monitored during the procedure by the interventional radiology nurse under my direct supervision. FLUOROSCOPY TIME:  Fluoroscopy Time: 6 minutes 18 seconds (66 mGy). COMPLICATIONS: None immediate. TECHNIQUE: Informed written consent was obtained from the patient after a thorough discussion of the procedural risks, benefits and alternatives. All questions were addressed. Maximal Sterile Barrier Technique was utilized including caps, mask, sterile gowns, sterile gloves, sterile drape, hand hygiene and skin antiseptic. A timeout was performed prior to the initiation of the procedure. The right common femoral vein was interrogated with ultrasound. An image was obtained and stored for the medical record. Local anesthesia was attained by infiltration with 1% lidocaine. A small dermatotomy was made. Under real-time sonographic guidance, the vessel was punctured with a 21 gauge micropuncture needle. Using standard technique, the initial micro needle was exchanged over a 0.018 micro wire for a transitional 4 [place] micro sheath. The micro sheath was then exchanged over a 0.035 wire for a working 6 [place] vascular sheath. Using the same technique, a second ultrasound-guided puncture of the right common femoral vein was made slightly medial to the first. The micro needle was then exchanged over the same steps for a second working 6 [place] vascular sheath. There are now 2 separate sheaths in the right common femoral vein. Starting with more lateral sheath, a C2 cobra catheter was advanced over a a Bentson  wire in used to navigate through the heart and into the left main pulmonary artery. A left main pulmonary arteriogram was performed. Multiple filling defects are identified consistent with multifocal PG. The catheter was then navigated into the left lower lobe pulmonary artery. Repeat arteriography was performed confirming the anatomic position. A Rosen wire was advanced into the left lower lobe pulmonary artery through the catheter. Attention was turned to the more medial 6 [place] sheath. Again, a C2 cobra catheter was advanced over a Bentson wire into the heart and navigated through the heart and into the right main pulmonary artery. A right main pulmonary arteriogram was performed. There is significant outflow obstruction with a large clot at the bifurcation into the upper and lower lobe pulmonary arteries. Pressures were measured. The pulmonary arterial pressure was 53/29 for a mean of 30 mm Hg. The catheter was advanced into the right lower lobe pulmonary artery. Contrast injection was performed under fluoroscopy to confirm the anatomic position. A Rosen wire was advanced. Two EKOS 12 cm infusion length ultrasound assisted infusion catheters were then selected in advanced over the wires and positioned in the right and left lower lobe pulmonary arteries. Bilateral pulmonary arterial I cysts was then initiated at a rate of 0.5 milligrams/hour per catheter for a total rate of 1.0 milligrams/hour. The catheters were secured in place. The patient tolerated the procedure well. FINDINGS: 1. Multifocal bilateral  pulmonary emboli. 2. Mean main pulmonary artery pressure 30 mm Hg consistent with pulmonary arterial hypertension. IMPRESSION: Successful initiation of bilateral pulmonary artery ultrasound accelerated catheter directed thrombolysis. Signed, Criselda Peaches, MD [facility] Electronically Signed   By: Jacqulynn Cadet M.D.   On: 02/07/2016 14:50    Ir Infusion Thrombol Arterial Initial (ms)  Result Date: 02/07/2016 INDICATION: 75 year old female with sub massive pulmonary embolus and acute cor pulmonale. EXAM: IR INFUSION THROMBOL ARTERIAL INITIAL (MS); ADDITIONAL ARTERIOGRAPHY; IR ULTRASOUND GUIDANCE VASC ACCESS RIGHT; BILATERAL PULMONARY ARTERIOGRAPHY COMPARISON:  Chest CTA 02/06/2016 MEDICATIONS: None. ANESTHESIA/SEDATION: Versed 0.5 mg IV; Fentanyl 50 mcg IV Moderate Sedation Time:  53 minutes The patient was continuously monitored during the procedure by the interventional radiology nurse under my direct supervision. FLUOROSCOPY TIME:  Fluoroscopy Time: 6 minutes 18 seconds (66 mGy). COMPLICATIONS: None immediate. TECHNIQUE: Informed written consent was obtained from the patient after a thorough discussion of the procedural risks, benefits and alternatives. All questions were addressed. Maximal Sterile Barrier Technique was utilized including caps, mask, sterile gowns, sterile gloves, sterile drape, hand hygiene and skin antiseptic. A timeout was performed prior to the initiation of the procedure. The right common femoral vein was interrogated with ultrasound. An image was obtained and stored for the medical record. Local anesthesia was attained by infiltration with 1% lidocaine. A small dermatotomy was made. Under real-time sonographic guidance, the vessel was punctured with a 21 gauge micropuncture needle. Using standard technique, the initial micro needle was exchanged over a 0.018 micro wire for a transitional 4 [place] micro sheath. The micro sheath was then exchanged over a 0.035 wire for a working 6 [place] vascular sheath. Using the same technique, a second ultrasound-guided puncture of the right common femoral vein was made slightly medial to the first. The micro needle was then exchanged over the same steps for a second working 6 [place] vascular sheath. There are now 2 separate sheaths in the right common femoral vein. Starting with more  lateral sheath, a C2 cobra catheter was advanced over a a Bentson wire in used to navigate through the heart and into the left main pulmonary artery. A left main pulmonary arteriogram was performed. Multiple filling defects are identified consistent with multifocal PG. The catheter was then navigated into the left lower lobe pulmonary artery. Repeat arteriography was performed confirming the anatomic position. A Rosen wire was advanced into the left lower lobe pulmonary artery through the catheter. Attention was turned to the more medial 6 French sheath. Again, a C2 cobra catheter was advanced over a Bentson wire into the heart and navigated through the heart and into the right main pulmonary artery. A right main pulmonary arteriogram was performed. There is significant outflow obstruction with a large clot at the bifurcation into the upper and lower lobe pulmonary arteries. Pressures were measured. The pulmonary arterial pressure was 53/29 for a mean of 30 mm Hg. The catheter was advanced into the right lower lobe pulmonary artery. Contrast injection was performed under fluoroscopy to confirm the anatomic position. A Rosen wire was advanced. Two EKOS 12 cm infusion length ultrasound assisted infusion catheters were then selected in advanced over the wires and positioned in the right and left lower lobe pulmonary arteries. Bilateral pulmonary arterial I cysts was then initiated at a rate of 0.5 milligrams/hour per catheter for a total rate of 1.0 milligrams/hour. The catheters were secured in place. The patient tolerated the procedure well. FINDINGS: 1. Multifocal bilateral pulmonary emboli. 2.  Mean main pulmonary artery pressure 30 mm Hg consistent with pulmonary arterial hypertension. IMPRESSION: Successful initiation of bilateral pulmonary artery ultrasound accelerated catheter directed thrombolysis. Signed, Criselda Peaches, MD [facility] Radiology  Electronically Signed   By: Jacqulynn Cadet M.D.   On: 02/07/2016 14:50    Cardiac Studies   Echocardiogram 02/04/2016: Study Conclusions  - Left ventricle: The cavity size was normal. Wall thickness was   increased in a pattern of moderate LVH. Systolic function was   normal. The estimated ejection fraction was in the range of 55%   to 60%. Wall motion was normal; there were no regional wall   motion abnormalities. Doppler parameters are consistent with   abnormal left ventricular relaxation (grade 1 diastolic   dysfunction). - Aortic valve: Moderately calcified annulus. Moderately thickened,   moderately calcified leaflets. There was mild regurgitation. - Right ventricle: The cavity size was mildly dilated. Systolic   function was mildly to moderately reduced. - Right atrium: The atrium was moderately dilated. - Tricuspid valve: There was mild-moderate regurgitation. - Pulmonary arteries: Systolic pressure was moderately to severely   increased. PA peak pressure: 67 mm Hg (S).  Patient Profile     75 year old woman with hypertension and nonobstructive CAD by recent cardiac catheterization, subsequent diagnosed with large bilateral pulmonary emboli associated with pulmonary hypertension and RV dysfunction. Patient seen by critical care and underwent catheter directed thrombolysis with observation in the ICU. Experienced significant bleeding at sheath sites last night and treament stopped. Sheaths pulled and she is stable at this time.  Assessment & Plan    1. Submassive bilateral pulmonary emboli, underwent catheter directed thrombolysis. Had significant bleeding at sheath sites and treatment stopped. Sheaths pulled. Echocardiogram demonstrated RV dysfunction with PASP 67 mmHg.  2. Nonobstructive CAD based on cardiac catheterization from November 22. No angina.  3. Elevated troponin I, likely secondary to problem #1.  4. Essential hypertension.  Further management of  pulmonary emboli and eventual transition to alternative anticoagulant - per primary team. Will sign off now- please call if needed.  Signed, Laterrance Nauta Martinique MD, [facility]    02/09/2016, 9:25 AM

## 2016-02-09 NOTE — Care Management Important Message (Signed)
Important Message  Patient Details  Name: Catherine Snyder MRN: [id number] Date of Birth: 01/15/1941   Medicare Important Message Given:  Yes    Nathen May 02/09/2016, 11:27 AM

## 2016-02-09 NOTE — Progress Notes (Signed)
Patient ID: Catherine Snyder, female   DOB: 1940/09/30, 75 y.o.   MRN: [id number]    Referring Physician(s): Dr. Baltazar Apo  Supervising Physician: Dr. Corrie Mckusick  Patient Status: [facility] - In-pt  Chief Complaint: Submassive PE with right heart strain  Subjective: S/p EKOS PE lysis, completed about 98% of infusion, then had hemorrhage from (R)groin access site, controlled. Feeling much better today Was able to walk around unit yesterday with minimal DOE. Denies CP, SOB, cough at present time. Still some soreness in right groin, no LE pain.  Allergies: Patient has no known allergies.  Medications:  Current Facility-Administered Medications:  .  acetaminophen (TYLENOL) tablet 650 mg, 650 mg, Oral, Q4H PRN, Troy Sine, MD, 650 mg at 02/07/16 1622 .  amLODipine (NORVASC) tablet 10 mg, 10 mg, Oral, Daily, Leonie Man, MD, 10 mg at 02/09/16 [id number] .  aspirin chewable tablet 81 mg, 81 mg, Oral, Daily, Troy Sine, MD, 81 mg at 02/09/16 [id number] .  atorvastatin (LIPITOR) tablet 80 mg, 80 mg, Oral, q1800, Marat Fudim, MD, 80 mg at 02/08/16 1730 .  heparin ADULT infusion 100 units/mL (25000 units/264mL sodium chloride 0.45%), 1,000 Units/hr, Intravenous, Continuous, Rebecka Apley, [facility], Last Rate: 10 mL/hr at 02/08/16 1903, 1,000 Units/hr at 02/08/16 1903 .  nitroGLYCERIN (NITROSTAT) SL tablet 0.4 mg, 0.4 mg, Sublingual, Q5 Min x 3 PRN, Marat Fudim, MD .  ondansetron (ZOFRAN) injection 4 mg, 4 mg, Intravenous, Q6H PRN, Troy Sine, MD    Vital Signs: BP 104/66   Pulse 76   Temp 99.2 F (37.3 C) (Oral)   Resp (!) 22   Ht 5\' 3"  (1.6 m)   Wt 165 lb (74.8 kg)   SpO2 98%   BMI 29.23 kg/m   Physical Exam: Abd: soft, NT,  Ext: Right groin site soft, mildly tender, with no hematoma. Lungs: CTAB  Imaging:  Ir Infusion Thrombol Arterial Initial (ms)  Result Date: 02/07/2016 INDICATION: 75 year old female with sub massive pulmonary embolus and acute cor pulmonale. EXAM: IR  INFUSION THROMBOL ARTERIAL INITIAL (MS); ADDITIONAL ARTERIOGRAPHY; IR ULTRASOUND GUIDANCE VASC ACCESS RIGHT; BILATERAL PULMONARY ARTERIOGRAPHY COMPARISON:  Chest CTA 02/06/2016 MEDICATIONS: None. ANESTHESIA/SEDATION: Versed 0.5 mg IV; Fentanyl 50 mcg IV Moderate Sedation Time:  53 minutes The patient was continuously monitored during the procedure by the interventional radiology nurse under my direct supervision. FLUOROSCOPY TIME:  Fluoroscopy Time: 6 minutes 18 seconds (66 mGy). COMPLICATIONS: None immediate. TECHNIQUE: Informed written consent was obtained from the patient after a thorough discussion of the procedural risks, benefits and alternatives. All questions were addressed. Maximal Sterile Barrier Technique was utilized including caps, mask, sterile gowns, sterile gloves, sterile drape, hand hygiene and skin antiseptic. A timeout was performed prior to the initiation of the procedure. The right common femoral vein was interrogated with ultrasound. An image was obtained and stored for the medical record. Local anesthesia was attained by infiltration with 1% lidocaine. A small dermatotomy was made. Under real-time sonographic guidance, the vessel was punctured with a 21 gauge micropuncture needle. Using standard technique, the initial micro needle was exchanged over a 0.018 micro wire for a transitional 4 [place] micro sheath. The micro sheath was then exchanged over a 0.035 wire for a working 6 [place] vascular sheath. Using the same technique, a second ultrasound-guided puncture of the right common femoral vein was made slightly medial to the first. The micro needle was then exchanged over the same steps for a second working 6 [place] vascular sheath. There are  now 2 separate sheaths in the right common femoral vein. Starting with more lateral sheath, a C2 cobra catheter was advanced over a a Bentson wire in used to navigate through the heart and into the left main pulmonary artery. A left main pulmonary  arteriogram was performed. Multiple filling defects are identified consistent with multifocal PG. The catheter was then navigated into the left lower lobe pulmonary artery. Repeat arteriography was performed confirming the anatomic position. A Rosen wire was advanced into the left lower lobe pulmonary artery through the catheter. Attention was turned to the more medial 6 French sheath. Again, a C2 cobra catheter was advanced over a Bentson wire into the heart and navigated through the heart and into the right main pulmonary artery. A right main pulmonary arteriogram was performed. There is significant outflow obstruction with a large clot at the bifurcation into the upper and lower lobe pulmonary arteries. Pressures were measured. The pulmonary arterial pressure was 53/29 for a mean of 30 mm Hg. The catheter was advanced into the right lower lobe pulmonary artery. Contrast injection was performed under fluoroscopy to confirm the anatomic position. A Rosen wire was advanced. Two EKOS 12 cm infusion length ultrasound assisted infusion catheters were then selected in advanced over the wires and positioned in the right and left lower lobe pulmonary arteries. Bilateral pulmonary arterial I cysts was then initiated at a rate of 0.5 milligrams/hour per catheter for a total rate of 1.0 milligrams/hour. The catheters were secured in place. The patient tolerated the procedure well. FINDINGS: 1. Multifocal bilateral pulmonary emboli. 2. Mean main pulmonary artery pressure 30 mm Hg consistent with pulmonary arterial hypertension. IMPRESSION: Successful initiation of bilateral pulmonary artery ultrasound accelerated catheter directed thrombolysis. Signed, Criselda Peaches, MD [facility] Radiology Electronically Signed   By: Jacqulynn Cadet M.D.   On: 02/07/2016 14:50   Ir Infusion Thrombol Arterial Initial (ms)  Result Date: 02/07/2016 INDICATION: 75 year old female  with sub massive pulmonary embolus and acute cor pulmonale. EXAM: IR INFUSION THROMBOL ARTERIAL INITIAL (MS); ADDITIONAL ARTERIOGRAPHY; IR ULTRASOUND GUIDANCE VASC ACCESS RIGHT; BILATERAL PULMONARY ARTERIOGRAPHY COMPARISON:  Chest CTA 02/06/2016 MEDICATIONS: None. ANESTHESIA/SEDATION: Versed 0.5 mg IV; Fentanyl 50 mcg IV Moderate Sedation Time:  53 minutes The patient was continuously monitored during the procedure by the interventional radiology nurse under my direct supervision. FLUOROSCOPY TIME:  Fluoroscopy Time: 6 minutes 18 seconds (66 mGy). COMPLICATIONS: None immediate. TECHNIQUE: Informed written consent was obtained from the patient after a thorough discussion of the procedural risks, benefits and alternatives. All questions were addressed. Maximal Sterile Barrier Technique was utilized including caps, mask, sterile gowns, sterile gloves, sterile drape, hand hygiene and skin antiseptic. A timeout was performed prior to the initiation of the procedure. The right common femoral vein was interrogated with ultrasound. An image was obtained and stored for the medical record. Local anesthesia was attained by infiltration with 1% lidocaine. A small dermatotomy was made. Under real-time sonographic guidance, the vessel was punctured with a 21 gauge micropuncture needle. Using standard technique, the initial micro needle was exchanged over a 0.018 micro wire for a transitional 4 [place] micro sheath. The micro sheath was then exchanged over a 0.035 wire for a working 6 [place] vascular sheath. Using the same technique, a second ultrasound-guided puncture of the right common femoral vein was made slightly medial to the first. The micro needle was then exchanged over the same steps for a second working 6 [place] vascular sheath. There are now 2 separate  sheaths in the right common femoral vein. Starting with more lateral sheath, a C2 cobra catheter was advanced over a a Bentson wire in used to navigate through the heart  and into the left main pulmonary artery. A left main pulmonary arteriogram was performed. Multiple filling defects are identified consistent with multifocal PG. The catheter was then navigated into the left lower lobe pulmonary artery. Repeat arteriography was performed confirming the anatomic position. A Rosen wire was advanced into the left lower lobe pulmonary artery through the catheter. Attention was turned to the more medial 6 French sheath. Again, a C2 cobra catheter was advanced over a Bentson wire into the heart and navigated through the heart and into the right main pulmonary artery. A right main pulmonary arteriogram was performed. There is significant outflow obstruction with a large clot at the bifurcation into the upper and lower lobe pulmonary arteries. Pressures were measured. The pulmonary arterial pressure was 53/29 for a mean of 30 mm Hg. The catheter was advanced into the right lower lobe pulmonary artery. Contrast injection was performed under fluoroscopy to confirm the anatomic position. A Rosen wire was advanced. Two EKOS 12 cm infusion length ultrasound assisted infusion catheters were then selected in advanced over the wires and positioned in the right and left lower lobe pulmonary arteries. Bilateral pulmonary arterial I cysts was then initiated at a rate of 0.5 milligrams/hour per catheter for a total rate of 1.0 milligrams/hour. The catheters were secured in place. The patient tolerated the procedure well. FINDINGS: 1. Multifocal bilateral pulmonary emboli. 2. Mean main pulmonary artery pressure 30 mm Hg consistent with pulmonary arterial hypertension. IMPRESSION: Successful initiation of bilateral pulmonary artery ultrasound accelerated catheter directed thrombolysis. Signed, Criselda Peaches, MD [facility] Radiology Electronically Signed   By: Jacqulynn Cadet M.D.   On: 02/07/2016 14:50    Labs:  CBC:  Recent Labs   02/08/16 0500 02/08/16 1222 02/08/16 1819 02/09/16 0144  WBC 7.0 6.5 6.3 7.0  HGB 8.5* 8.7* 9.2* 8.5*  HCT 27.6* 28.1* 29.7* 27.3*  PLT 204 194 213 230    COAGS:  Recent Labs  02/03/16 1619  02/07/16 0904 02/07/16 1824 02/08/16 0500 02/08/16 1819  INR 1.2*  --   --   --   --   --   APTT 25  < > 80* 92* 28 74*  < > = values in this interval not displayed.  BMP:  Recent Labs  02/04/16 0741 02/04/16 1633 02/05/16 0301 02/08/16 0038 02/09/16 0144  NA 141  --  140 139 138  K 3.6  --  3.7 3.3* 3.6  CL 109  --  111 110 109  CO2 22  --  21* 23 22  GLUCOSE 134*  --  116* 123* 100*  BUN 12  --  10 5* 5*  CALCIUM 8.8*  --  8.7* 8.0* 8.3*  CREATININE 0.84 0.79 0.79 0.66 0.65  GFRNONAA >60 >60 >60 >60 >60  GFRAA >60 >60 >60 >60 >60     Assessment and Plan: Submassive PE, s/p EKOS lysis with early completion secondary to bleeding/hemmorhage at right groin site. All sheaths/catheters pulled. Hgb stable at 8.5 Pt clinically/symptomatically improved. Agree with recs for long term anticoagulation  Electronically Signed: Ascencion Dike 02/09/2016, 10:04 AM   I spent a total of 15 Minutes at the the patient's bedside AND on the patient's hospital floor or unit, greater than 50% of which was counseling/coordinating care for PE

## 2016-02-09 NOTE — Progress Notes (Signed)
CARDIAC REHAB PHASE I   PRE:  Rate/Rhythm: 68 SR    BP: sitting 110/71    SaO2: 94 RA  MODE:  Ambulation: 200 ft   POST:  Rate/Rhythm: 88 SR    BP: sitting 124/85     SaO2: 94 RA  Pt eager to walk. Fairly steady with min assist, slow pace.  Pt tires easily with some SOB, not as bad as she was per her report. "Just tired" after walk. To recliner. Encouraged more walking. We will f/u as low priority.  [id number]  [place], ACSM 02/09/2016 1:05 PM

## 2016-02-09 NOTE — Progress Notes (Signed)
Pt has arrived to 2W from 36M. Telemetry box applied and CCMD notified. Pt oriented to room. Vitals stable. Pt denies needs at this time. Will continue current plan of care.   Grant Fontana BSN, RN

## 2016-02-09 NOTE — Progress Notes (Signed)
ANTICOAGULATION CONSULT NOTE - Follow Up Consult  Pharmacy Consult for Heparin  Indication: pulmonary embolus, s/p EKOS  No Known Allergies  Patient Measurements: Height: 5\' 3"  (160 cm) Weight: 165 lb (74.8 kg) IBW/kg (Calculated) : 52.4  Vital Signs: Temp: 98.2 F (36.8 C) (11/26 2000) Temp Source: Oral (11/26 2000) BP: 104/66 (11/26 1800) Pulse Rate: 76 (11/26 1800)  Labs:  Recent Labs  02/07/16 1824  02/08/16 0038 02/08/16 0420 02/08/16 0500 02/08/16 1222 02/08/16 1819 02/09/16 0144  HGB 9.6*  < >  --   --  8.5* 8.7* 9.2* 8.5*  HCT 31.9*  < >  --   --  27.6* 28.1* 29.7* 27.3*  PLT 250  < >  --   --  204 194 213 230  APTT 92*  --   --   --  28  --  74*  --   HEPARINUNFRC 0.76*  < >  --  <0.10*  --   --  0.31 0.33  CREATININE  --   --  0.66  --   --   --   --   --   < > = values in this interval not displayed.  Estimated Creatinine Clearance: 59.8 mL/min (by C-G formula based on SCr of 0.66 mg/dL).   Assessment: Heparin for PE, s/p EKOS, heparin level is therapeutic x 2 after re-start s/p holding heparin for bleeding from sheath site.   Goal of Therapy:  Heparin level 0.3-0.7 units/ml Monitor platelets by anticoagulation protocol: Yes   Plan:  -Cont heparin at 1000 units/hr -Daily CBC/HL -Monitor for any further bleeding  Narda Bonds 02/09/2016,2:55 AM

## 2016-02-10 DIAGNOSIS — I2729 Other secondary pulmonary hypertension: Secondary | ICD-10-CM | POA: Diagnosis not present

## 2016-02-10 DIAGNOSIS — R748 Abnormal levels of other serum enzymes: Secondary | ICD-10-CM | POA: Diagnosis not present

## 2016-02-10 DIAGNOSIS — I2510 Atherosclerotic heart disease of native coronary artery without angina pectoris: Secondary | ICD-10-CM | POA: Diagnosis not present

## 2016-02-10 DIAGNOSIS — I2602 Saddle embolus of pulmonary artery with acute cor pulmonale: Secondary | ICD-10-CM

## 2016-02-10 LAB — BASIC METABOLIC PANEL
ANION GAP: 10 (ref 5–15)
BUN: 8 mg/dL (ref 6–20)
CALCIUM: 8.9 mg/dL (ref 8.9–10.3)
CO2: 18 mmol/L — ABNORMAL LOW (ref 22–32)
Chloride: 111 mmol/L (ref 101–111)
Creatinine, Ser: 0.64 mg/dL (ref 0.44–1.00)
Glucose, Bld: 106 mg/dL — ABNORMAL HIGH (ref 65–99)
Potassium: 3.4 mmol/L — ABNORMAL LOW (ref 3.5–5.1)
Sodium: 139 mmol/L (ref 135–145)

## 2016-02-10 LAB — CBC
HEMATOCRIT: 29.8 % — AB (ref 36.0–46.0)
Hemoglobin: 9.2 g/dL — ABNORMAL LOW (ref 12.0–15.0)
MCH: 23.3 pg — ABNORMAL LOW (ref 26.0–34.0)
MCHC: 30.9 g/dL (ref 30.0–36.0)
MCV: 75.4 fL — ABNORMAL LOW (ref 78.0–100.0)
PLATELETS: 278 10*3/uL (ref 150–400)
RBC: 3.95 MIL/uL (ref 3.87–5.11)
RDW: 21.2 % — AB (ref 11.5–15.5)
WBC: 7.1 10*3/uL (ref 4.0–10.5)

## 2016-02-10 LAB — ANCA TITERS: P-ANCA: <1:20 {titer}

## 2016-02-10 LAB — HEPARIN LEVEL (UNFRACTIONATED): HEPARIN UNFRACTIONATED: 0.49 [IU]/mL (ref 0.30–0.70)

## 2016-02-10 MED ORDER — RIVAROXABAN 15 MG PO TABS
15.0000 mg | ORAL_TABLET | Freq: Two times a day (BID) | ORAL | Status: DC
  Administered 2016-02-10 – 2016-02-11 (×2): 15 mg via ORAL
  Filled 2016-02-10 (×2): qty 1

## 2016-02-10 MED ORDER — RIVAROXABAN 20 MG PO TABS: 20.0000 mg | ORAL_TABLET | Freq: Every day | ORAL | Status: DC

## 2016-02-10 NOTE — Progress Notes (Addendum)
PROGRESS NOTE    Catherine Snyder  [id number] DOB: 1941/03/13 DOA: 02/03/2016 PCP: Gildardo Cranker, DO   Brief Narrative:  75 y.o.BF  PMHx HTN, bladder CA (s/p resection in 06/2015), and family history of CAD who presented to ED 11/21 with DOE and hypoxemia beginning 6 days PTA. Underwent L and R cath given suspicion for CAD. L cath was reassuring, R showed RV strain and PAH. V/q was intermediate prob, wedge shaped perfusion defects. CT-PA confirmed B large PE. PCCM consulted regarding suitability for catheter directed lytics.     Subjective: 11/28  /O 4, NAD. Patient in daughter states that positive DOE, negative CP, negative SOB.   Assessment & Plan:   Principal Problem:   Pulmonary hypertension due to thromboembolism [facility]) Active Problems:   Essential hypertension   Demand ischemia of myocardium (HCC) - Not NSTEMI   Elevated troponin   Dyspnea on exertion   Coronary artery disease, non-occlusive   Acute saddle pulmonary embolism without acute cor pulmonale (HCC)   Submassive Bilateral Pulmonary Emboli, -  11/25 S/P Ultrasound-Enhanced Thrombolysis(EKOS). Had significant bleeding at sheath sites and treatment stopped. Echocardiogram demonstrated RV dysfunction with PASP 67 mmHg.  Pulmonary hypertension -See PE  Nonobstructive CAD native artery -S/P cardiac catheterization 11/22 see results below.   Elevated troponin I - likely secondary to PE, and CAD  Essential hypertension. -Currently controlled   DVT prophylaxis: Heparin per pharmacy Code Status: DO NOT RESUSCITATE Family Communication: Daughter present Disposition Plan: Discharged on 11/29   Consultants:  [facility] CM Cardiology  Procedures/Significant Events:  11/22 left and right catheterization >> nonobstructive coronary artery disease, pulmonary hypertension with estimated PA systolic in the [id number] >> V/q intermediate prob with wedge shaped defects,  11/24 CT-PA >> confirmed B large PE w  evidence r heart strain 11/25 bilateral pulmonary artery [place] accelerated catheter directed thrombolysis  11/26 transfuse 2 units PRBC   VENTILATOR SETTINGS:    Cultures    Antimicrobials:    Devices    LINES / TUBES:      Continuous Infusions: . sodium chloride    . sodium chloride       Objective: Vitals:   02/10/16 0620 02/10/16 0921 02/10/16 1255 02/10/16 2011  BP: 106/74 110/62 102/64 108/67  Pulse: 62 66 77 66  Resp:   18 18  Temp: 98.6 F (37 C)  98.2 F (36.8 C) 98.6 F (37 C)  TempSrc: Oral  Oral Oral  SpO2: 93%  99% 99%  Weight:      Height:        Intake/Output Summary (Last 24 hours) at 02/10/16 2108 Last data filed at 02/10/16 0900  Gross per 24 hour  Intake              240 ml  Output                0 ml  Net              240 ml   Filed Weights   02/07/16 0534 02/09/16 1000 02/10/16 0524  Weight: 74.8 kg (165 lb) 75.4 kg (166 lb 3.6 oz) 73.6 kg (162 lb 3.2 oz)    Examination:  General: A/O 4, NAD, No acute respiratory distress Eyes: negative scleral hemorrhage, negative anisocoria, negative icterus ENT: Negative Runny nose, negative gingival bleeding, Neck:  Negative scars, masses, torticollis, lymphadenopathy, JVD Lungs: Clear to auscultation bilaterally without wheezes or crackles Cardiovascular: Regular rate and rhythm without murmur gallop or rub normal S1 and  S2 Abdomen: negative abdominal pain, nondistended, positive soft, bowel sounds, no rebound, no ascites, no appreciable mass Extremities: No significant cyanosis, clubbing, or edema bilateral lower extremities Skin: Negative rashes, lesions, ulcers Psychiatric:  Negative depression, negative anxiety, negative fatigue, negative mania  Central nervous system:  Cranial nerves II through XII intact, tongue/uvula midline, all extremities muscle strength 5/5, sensation intact throughout,  negative dysarthria, negative expressive aphasia, negative receptive aphasia.  .      Data Reviewed: Care during the described time interval was provided by me .  I have reviewed this patient's available data, including medical history, events of note, physical examination, and all test results as part of my evaluation. I have personally reviewed and interpreted all radiology studies.  CBC:  Recent Labs Lab 02/08/16 0500 02/08/16 1222 02/08/16 1819 02/09/16 0144 02/10/16 0217  WBC 7.0 6.5 6.3 7.0 7.1  HGB 8.5* 8.7* 9.2* 8.5* 9.2*  HCT 27.6* 28.1* 29.7* 27.3* 29.8*  MCV 75.4* 75.5* 75.8* 75.4* 75.4*  PLT 204 194 213 230 0000000   Basic Metabolic Panel:  Recent Labs Lab 02/04/16 0741 02/04/16 1633 02/05/16 0301 02/08/16 0038 02/09/16 0144 02/10/16 0217  NA 141  --  140 139 138 139  K 3.6  --  3.7 3.3* 3.6 3.4*  CL 109  --  111 110 109 111  CO2 22  --  21* 23 22 18*  GLUCOSE 134*  --  116* 123* 100* 106*  BUN 12  --  10 5* 5* 8  CREATININE 0.84 0.79 0.79 0.66 0.65 0.64  CALCIUM 8.8*  --  8.7* 8.0* 8.3* 8.9   GFR: Estimated Creatinine Clearance: 59.3 mL/min (by C-G formula based on SCr of 0.64 mg/dL). Liver Function Tests: No results for input(s): AST, ALT, ALKPHOS, BILITOT, PROT, ALBUMIN in the last 168 hours. No results for input(s): LIPASE, AMYLASE in the last 168 hours. No results for input(s): AMMONIA in the last 168 hours. Coagulation Profile: No results for input(s): INR, PROTIME in the last 168 hours. Cardiac Enzymes:  Recent Labs Lab 02/04/16 0000 02/04/16 0741 02/04/16 1633  TROPONINI 0.29* 0.28* 0.27*   BNP (last 3 results) No results for input(s): PROBNP in the last 8760 hours. HbA1C: No results for input(s): HGBA1C in the last 72 hours. CBG:  Recent Labs Lab 02/07/16 1258  GLUCAP 117*   Lipid Profile: No results for input(s): CHOL, HDL, LDLCALC, TRIG, CHOLHDL, LDLDIRECT in the last 72 hours. Thyroid Function Tests: No results for input(s): TSH, T4TOTAL, FREET4, T3FREE, THYROIDAB in the last 72 hours. Anemia Panel: No  results for input(s): VITAMINB12, FOLATE, FERRITIN, TIBC, IRON, RETICCTPCT in the last 72 hours. Urine analysis:    Component Value Date/Time   APPEARANCEUR Turbid (A) 05/07/2015 1624   GLUCOSEU Negative 05/07/2015 1624   BILIRUBINUR Negative 05/07/2015 1624   PROTEINUR 2+ (A) 05/07/2015 1624   NITRITE Positive (A) 05/07/2015 1624   LEUKOCYTESUR 2+ (A) 05/07/2015 1624   Sepsis Labs: @LABRCNTIP (procalcitonin:4,lacticidven:4)  ) Recent Results (from the past 240 hour(s))  MRSA PCR Screening     Status: None   Collection Time: 02/07/16 12:37 PM  Result Value Ref Range Status   MRSA by PCR NEGATIVE NEGATIVE Final    Comment:        The GeneXpert MRSA Assay (FDA approved for NASAL specimens only), is one component of a comprehensive MRSA colonization surveillance program. It is not intended to diagnose MRSA infection nor to guide or monitor treatment for MRSA infections.  Radiology Studies: No results found.      Scheduled Meds: . amLODipine  10 mg Oral Daily  . aspirin  81 mg Oral Daily  . atorvastatin  80 mg Oral q1800  . Rivaroxaban  15 mg Oral BID WC  . [START ON 03/02/2016] rivaroxaban  20 mg Oral Q supper  . sodium chloride flush  3 mL Intravenous Q12H  . sodium chloride flush  3 mL Intravenous Q12H   Continuous Infusions: . sodium chloride    . sodium chloride       LOS: 7 days    Time spent: 40 minutes    WOODS, Geraldo Docker, MD [facility] Pager [phone]   If 7PM-7AM, please contact night-coverage [email or web address] Password [email or web address] 02/10/2016, 9:08 PM

## 2016-02-10 NOTE — Progress Notes (Signed)
ANTICOAGULATION CONSULT NOTE - Follow Up Consult  Pharmacy Consult for Heparin  Indication: pulmonary embolus, s/p EKOS  No Known Allergies  Patient Measurements: Height: 5\' 3"  (160 cm) Weight: 162 lb 3.2 oz (73.6 kg) IBW/kg (Calculated) : 52.4  Vital Signs: Temp: 98.2 F (36.8 C) (11/28 1255) Temp Source: Oral (11/28 1255) BP: 102/64 (11/28 1255) Pulse Rate: 77 (11/28 1255)  Labs:  Recent Labs  02/07/16 1824  02/08/16 0038  02/08/16 0500  02/08/16 1819 02/09/16 0144 02/10/16 0217  HGB 9.6*  < >  --   --  8.5*  < > 9.2* 8.5* 9.2*  HCT 31.9*  < >  --   --  27.6*  < > 29.7* 27.3* 29.8*  PLT 250  < >  --   --  204  < > 213 230 278  APTT 92*  --   --   --  28  --  74*  --   --   HEPARINUNFRC 0.76*  < >  --   < >  --   --  0.31 0.33 0.49  CREATININE  --   --  0.66  --   --   --   --  0.65 0.64  < > = values in this interval not displayed.  Estimated Creatinine Clearance: 59.3 mL/min (by C-G formula based on SCr of 0.64 mg/dL).  Assessment: 46 yof transitioning from heparin to Xarelto for PE, s/p EKOS. Heparin level therapeutic earlier today, will stop infusion tonight and give first dose of Xarelto. No bleeding reported. CBC and renal function stable.  Goal of Therapy:  aPTT 66-102 seconds Monitor platelets by anticoagulation protocol: Yes   Plan:  -Xarelto 15mg  BID for 21 days then Xarelto 20mg  QD -Monitor renal function and CBC -Monitor for any bleeding  Georga Bora, PharmD Clinical Pharmacist Pager: [phone] 02/10/2016 6:18 PM

## 2016-02-10 NOTE — Progress Notes (Signed)
ANTICOAGULATION CONSULT NOTE - Follow Up Consult  Pharmacy Consult for Heparin  Indication: pulmonary embolus, s/p EKOS  No Known Allergies  Patient Measurements: Height: 5\' 3"  (160 cm) Weight: 162 lb 3.2 oz (73.6 kg) IBW/kg (Calculated) : 52.4  Vital Signs: Temp: 98.6 F (37 C) (11/28 0620) Temp Source: Oral (11/28 0620) BP: 106/74 (11/28 0620) Pulse Rate: 62 (11/28 0620)  Labs:  Recent Labs  02/07/16 1824  02/08/16 0038  02/08/16 0500  02/08/16 1819 02/09/16 0144 02/10/16 0217  HGB 9.6*  < >  --   --  8.5*  < > 9.2* 8.5* 9.2*  HCT 31.9*  < >  --   --  27.6*  < > 29.7* 27.3* 29.8*  PLT 250  < >  --   --  204  < > 213 230 278  APTT 92*  --   --   --  28  --  74*  --   --   HEPARINUNFRC 0.76*  < >  --   < >  --   --  0.31 0.33 0.49  CREATININE  --   --  0.66  --   --   --   --  0.65 0.64  < > = values in this interval not displayed.  Estimated Creatinine Clearance: 59.3 mL/min (by C-G formula based on SCr of 0.64 mg/dL).   Assessment: 80 yof continuing on heparin for PE, s/p EKOS. Heparin level remains therapeutic (0.49) after re-start s/p holding heparin for bleeding from sheath site. CBC stable, no further bleed issues documented.  Goal of Therapy:  Heparin level 0.3-0.7 units/ml Monitor platelets by anticoagulation protocol: Yes   Plan:  -Heparin at 1000 units/hr -Daily CBC/Heparin level -Monitor for any further bleeding -F/u plan for long-term anticoagulation   Elicia Lamp, PharmD, BCPS Clinical Pharmacist 02/10/2016 8:26 AM

## 2016-02-10 NOTE — Consult Note (Signed)
[facility] CM Primary Care Navigator  02/10/2016  Catherine Snyder 10-Jun-1940 [id number]   Met with patient and sister Mikle Bosworth) at the bedside to identify possible discharge needs.  Patient reports having increased shortness of breath that had led to this admission. Patient states Dr. Gildardo Cranker with [facility] is her primary care provider.    She reports [facility] to obtain medications without any problem.  Patient's daughter Tomasa Hosteller) manages her medications at home straight out of the containers.   Daughter provides transportation to her doctors' appointments as stated by patient.  Plan for discharge is home per patient and daughter is the primary caregiver for her.   Patient voiced understanding to call primary care provider's office once discharged, for a post discharge follow-up appointment within a week or sooner if needs arise.  She denies further needs or concerns at this time.   For additional questions please contact:  Edwena Felty A. Jenene Kauffmann, BSN, RN-BC [facility] PRIMARY CARE Navigator Cell: [phone]

## 2016-02-11 DIAGNOSIS — I2602 Saddle embolus of pulmonary artery with acute cor pulmonale: Secondary | ICD-10-CM | POA: Diagnosis not present

## 2016-02-11 DIAGNOSIS — I2510 Atherosclerotic heart disease of native coronary artery without angina pectoris: Secondary | ICD-10-CM | POA: Diagnosis not present

## 2016-02-11 DIAGNOSIS — R269 Unspecified abnormalities of gait and mobility: Secondary | ICD-10-CM | POA: Diagnosis not present

## 2016-02-11 DIAGNOSIS — I2692 Saddle embolus of pulmonary artery without acute cor pulmonale: Secondary | ICD-10-CM

## 2016-02-11 DIAGNOSIS — I2699 Other pulmonary embolism without acute cor pulmonale: Secondary | ICD-10-CM | POA: Diagnosis not present

## 2016-02-11 DIAGNOSIS — I272 Pulmonary hypertension, unspecified: Secondary | ICD-10-CM

## 2016-02-11 DIAGNOSIS — R748 Abnormal levels of other serum enzymes: Secondary | ICD-10-CM | POA: Diagnosis not present

## 2016-02-11 DIAGNOSIS — D649 Anemia, unspecified: Secondary | ICD-10-CM

## 2016-02-11 DIAGNOSIS — D508 Other iron deficiency anemias: Secondary | ICD-10-CM

## 2016-02-11 DIAGNOSIS — I2729 Other secondary pulmonary hypertension: Secondary | ICD-10-CM | POA: Diagnosis not present

## 2016-02-11 LAB — TYPE AND SCREEN
ABO/RH(D): AB POS
Antibody Screen: NEGATIVE
UNIT DIVISION: 0
UNIT DIVISION: 0
Unit division: 0

## 2016-02-11 LAB — HEPARIN LEVEL (UNFRACTIONATED): HEPARIN UNFRACTIONATED: 0.7 [IU]/mL (ref 0.30–0.70)

## 2016-02-11 MED ORDER — RIVAROXABAN 20 MG PO TABS: 20.0000 mg | ORAL_TABLET | Freq: Every day | ORAL | 0 refills | Status: DC

## 2016-02-11 MED ORDER — NITROGLYCERIN 0.4 MG SL SUBL: 0.4000 mg | SUBLINGUAL_TABLET | SUBLINGUAL | 0 refills | Status: DC | PRN

## 2016-02-11 MED ORDER — RIVAROXABAN 15 MG PO TABS: 15.0000 mg | ORAL_TABLET | Freq: Two times a day (BID) | ORAL | 0 refills | Status: DC

## 2016-02-11 MED ORDER — ATORVASTATIN CALCIUM 80 MG PO TABS: 80.0000 mg | ORAL_TABLET | Freq: Every day | ORAL | 0 refills | Status: DC

## 2016-02-11 NOTE — Progress Notes (Signed)
Insurance check completed for Xarelto [facility] @ [place]  #   [id number]   XARELTO 20 MG DAILY  30/30 TAB   COVER- YES  CO-PAY- $ 39.36  TIER- 3 DRUG  PRIOR APPROVAL - YES # [phone]  PH[facility]ACY: [facility], [facility]

## 2016-02-11 NOTE — Discharge Summary (Signed)
Physician Discharge Summary  Catherine Snyder [id number] DOB: [birth year] DOA: 02/03/2016  PCP: Gildardo Cranker, DO  Admit date: 02/03/2016 Discharge date: 02/11/2016  Time spent: 35 minutes  Recommendations for Outpatient Follow-up:  Submassive Bilateral Pulmonary Emboli,/Right Heart strain -  11/25 S/P Ultrasound-Enhanced Thrombolysis(EKOS). Had significant bleeding at sheath sites and treatment stopped. Echocardiogram demonstrated RV dysfunction with PASP 67 mmHg. -Patient and daughter were counseled 11/28 patient would need to remain on Xarelto for 3-6 months secondary to PE. -Schedule follow-up with Dr. Shelva Majestic Cardiology in 3-4 weeks PE with right heart strain  Pulmonary hypertension -See PE  Nonobstructive CAD native artery -S/P cardiac catheterization 11/22 see results below.   Elevated troponin I - likely secondary to PE, and CAD  Essential hypertension. -Currently controlled  Iron deficiency anemia? -Address with PCP -Schedule follow-up appointment with Dr. Gildardo Cranker in 1-2 weeks PE with right heart strain, iron deficiency anemia, essential hypertension   Discharge Diagnoses:  Principal Problem:   Pulmonary hypertension due to thromboembolism [facility]) Active Problems:   Essential hypertension   Demand ischemia of myocardium ([place]) - Not NSTEMI   Elevated troponin   Dyspnea on exertion   Coronary artery disease, non-occlusive   Acute saddle pulmonary embolism without acute cor pulmonale (HCC)   Acute saddle pulmonary embolism with acute cor pulmonale (HCC)   CAD in native artery   Discharge Condition: Stable  Diet recommendation: Heart healthy  Filed Weights   02/09/16 1000 02/10/16 0524 02/11/16 0425  Weight: 75.4 kg (166 lb 3.6 oz) 73.6 kg (162 lb 3.2 oz) 74 kg (163 lb 3.2 oz)    History of present illness:  75 y.o.BF  PMHx HTN, bladder CA (s/p resection in 06/2015), and family history of CAD who presented to ED 11/21 with DOE and  hypoxemia beginning 6 days PTA. Underwent L and R cath given suspicion for CAD. L cath was reassuring, R showed RV strain and PAH. V/q was intermediate prob, wedge shaped perfusion defects. CT-PA confirmed B large PE. PCCM consulted regarding suitability for catheter directed lytics.  During this hospitalization patient was found to have bilateral large PEs most likely secondary to her bladder cancer. Patient S/P EKOS complicated by bleeding which required transfusion packed red blood cells. Patient has responded well to treatment. Does not require home O2, however does still have some residual DOE. Patient and daughter aware she will need to remain on anticoagulation for a minimum of 3-6 months.     Consultants:  [facility] CM Cardiology  Procedures/Significant Events:  11/22 left and right catheterization >> nonobstructive coronary artery disease, pulmonary hypertension with estimated PA systolic in the [id number] VQ scan >>intermediate probability with 2 moderate wedge-shaped peripheral defects within the superior and posterior upper right lung. Additional small wedge-shaped perfusion defect posterior left upper lobe; Cardiomegaly. 11/24 CT-PA >> confirmed B large PE w evidence rt heart strain 11/25 bilateral pulmonary artery [place] accelerated catheter directed thrombolysis  11/26 transfuse 2 units PRBC    Discharge Exam: Vitals:   02/10/16 2011 02/11/16 0425 02/11/16 0830 02/11/16 1222  BP:  (!) 110/57 (!) 110/56 110/62  Pulse:  65 64 68  Resp: 18 16 (!) 23 20  Temp:  98.7 F (37.1 C) 98.7 F (37.1 C) 98.6 F (37 C)  TempSrc:  Oral Oral Oral  SpO2:  96% 97% 100%  Weight:  74 kg (163 lb 3.2 oz)    Height:        General: A/O 4, NAD, No acute respiratory distress Eyes:  negative scleral hemorrhage, negative anisocoria, negative icterus ENT: Negative Runny nose, negative gingival bleeding, Neck:  Negative scars, masses, torticollis, lymphadenopathy, JVD Lungs: Clear to auscultation  bilaterally without wheezes or crackles Cardiovascular: Regular rate and rhythm without murmur gallop or rub normal S1 and S2    Discharge Instructions     Medication List    STOP taking these medications   furosemide 20 MG tablet Commonly known as:  LASIX   lisinopril 2.5 MG tablet Commonly known as:  PRINIVIL,ZESTRIL     TAKE these medications   amLODipine 10 MG tablet Commonly known as:  NORVASC take 1 tablet by mouth once daily   aspirin 81 MG chewable tablet Chew 81 mg by mouth daily.   atorvastatin 80 MG tablet Commonly known as:  LIPITOR Take 1 tablet (80 mg total) by mouth daily at 6 PM.   nitroGLYCERIN 0.4 MG SL tablet Commonly known as:  NITROSTAT Place 1 tablet (0.4 mg total) under the tongue every 5 (five) minutes x 3 doses as needed for chest pain.   Rivaroxaban 15 MG Tabs tablet Commonly known as:  XARELTO Take 1 tablet (15 mg total) by mouth 2 (two) times daily with a meal.   rivaroxaban 20 MG Tabs tablet Commonly known as:  XARELTO Take 1 tablet (20 mg total) by mouth daily with supper. Start taking on:  03/02/2016            Durable Medical Equipment        Start     Ordered   02/11/16 1155  For home use only DME Walker rolling  Once    Question:  Patient needs a walker to treat with the following condition  Answer:  Dependent on walker for ambulation   02/11/16 1154     No Known Allergies Follow-up Information    Shelva Majestic, MD. Schedule an appointment as soon as possible for a visit in 4 week(s).   Specialty:  Cardiology Why:  Schedule follow-up with Dr. Shelva Majestic Cardiology in 3-4 weeks PE with right heart strain Contact information: [place] [address] [address], [place], [place]. Schedule an appointment as soon as possible for a visit in 2 week(s).   Specialty:  Internal Medicine Why:  Schedule follow-up appointment with Dr. Gildardo Cranker in 1-2 weeks PE with right heart  strain, iron deficiency anemia, essential hypertension Contact information: [place] [id number] [place] [facility] up.   Why:  rolling walker arranged- to be delivered to room prior to discharge Contact information: [address] [place] [address] [phone]            The results of significant diagnostics from this hospitalization (including imaging, microbiology, ancillary and laboratory) are listed below for reference.    Significant Diagnostic Studies: Dg Chest 2 View  Result Date: 02/03/2016 CLINICAL DATA:  Shortness of breath EXAM: CHEST  2 VIEW COMPARISON:  None. FINDINGS: No acute infiltrate, consolidation, or pleural effusion. Mild cardiomegaly without overt failure. Tortuous ectatic aorta with atherosclerosis. No pneumothorax. IMPRESSION: 1. Mild cardiomegaly without overt failure 2. Mildly tortuous aorta with atherosclerosis 3. No acute infiltrate Electronically Signed   By: Donavan Foil M.D.   On: 02/03/2016 22:18   Ct Angio Chest Pe W Or Wo Contrast  Result Date: 02/06/2016 CLINICAL DATA:  Shortness of Breath, of positive V/Q scan EXAM:  CT ANGIOGRAPHY CHEST WITH CONTRAST TECHNIQUE: Multidetector CT imaging of the chest was performed using the standard protocol during bolus administration of intravenous contrast. Multiplanar CT image reconstructions and MIPs were obtained to evaluate the vascular anatomy. CONTRAST:  80 mL Isovue 370. COMPARISON:  02/04/2016 FINDINGS: Cardiovascular: Thoracic aorta shows diffuse atherosclerotic calcifications without aneurysmal dilatation. Pulmonary artery is well visualized and demonstrates significant bilateral pulmonary emboli. There is dilatation of the right ventricle with a an RV/LV ratio of 1.8 consistent with right heart strain. Coronary calcifications are noted. Mediastinum/Nodes: Thoracic inlet is within normal limits. No significant hilar or mediastinal  adenopathy is noted. No axillary adenopathy is seen. Lungs/Pleura: Lungs are well aerated bilaterally without focal confluent infiltrate or sizable effusion. No findings to suggest pulmonary infarct are noted. No pneumothorax is seen. Upper Abdomen: Within normal limits. Musculoskeletal: Within normal limits. Review of the MIP images confirms the above findings. IMPRESSION: Positive for acute PE with CT evidence of right heart strain (RV/LV Ratio = 1.8) consistent with at least submassive (intermediate risk) PE. The presence of right heart strain has been associated with an increased risk of morbidity and mortality. Please activate Code PE by paging [phone]. Critical Value/emergent results were called by telephone at the time of interpretation on 02/06/2016 at 12:30 pm to Dr. Ellyn Hack, who verbally acknowledged these results. Electronically Signed   By: Inez Catalina M.D.   On: 02/06/2016 12:31   Ir Angiogram Pulmonary Bilateral Selective  Result Date: 02/07/2016 INDICATION: 75 year old female with sub massive pulmonary embolus and acute cor pulmonale. EXAM: IR INFUSION THROMBOL ARTERIAL INITIAL (MS); ADDITIONAL ARTERIOGRAPHY; IR ULTRASOUND GUIDANCE VASC ACCESS RIGHT; BILATERAL PULMONARY ARTERIOGRAPHY COMPARISON:  Chest CTA 02/06/2016 MEDICATIONS: None. ANESTHESIA/SEDATION: Versed 0.5 mg IV; Fentanyl 50 mcg IV Moderate Sedation Time:  53 minutes The patient was continuously monitored during the procedure by the interventional radiology nurse under my direct supervision. FLUOROSCOPY TIME:  Fluoroscopy Time: 6 minutes 18 seconds (66 mGy). COMPLICATIONS: None immediate. TECHNIQUE: Informed written consent was obtained from the patient after a thorough discussion of the procedural risks, benefits and alternatives. All questions were addressed. Maximal Sterile Barrier Technique was utilized including caps, mask, sterile gowns, sterile gloves, sterile drape, hand hygiene and skin antiseptic. A timeout was  performed prior to the initiation of the procedure. The right common femoral vein was interrogated with ultrasound. An image was obtained and stored for the medical record. Local anesthesia was attained by infiltration with 1% lidocaine. A small dermatotomy was made. Under real-time sonographic guidance, the vessel was punctured with a 21 gauge micropuncture needle. Using standard technique, the initial micro needle was exchanged over a 0.018 micro wire for a transitional 4 [place] micro sheath. The micro sheath was then exchanged over a 0.035 wire for a working 6 [place] vascular sheath. Using the same technique, a second ultrasound-guided puncture of the right common femoral vein was made slightly medial to the first. The micro needle was then exchanged over the same steps for a second working 6 [place] vascular sheath. There are now 2 separate sheaths in the right common femoral vein. Starting with more lateral sheath, a C2 cobra catheter was advanced over a a Bentson wire in used to navigate through the heart and into the left main pulmonary artery. A left main pulmonary arteriogram was performed. Multiple filling defects are identified consistent with multifocal PG. The catheter was then navigated into the left lower lobe pulmonary artery. Repeat arteriography was performed confirming the anatomic position. A Rosen wire was advanced into  the left lower lobe pulmonary artery through the catheter. Attention was turned to the more medial 6 French sheath. Again, a C2 cobra catheter was advanced over a Bentson wire into the heart and navigated through the heart and into the right main pulmonary artery. A right main pulmonary arteriogram was performed. There is significant outflow obstruction with a large clot at the bifurcation into the upper and lower lobe pulmonary arteries. Pressures were measured. The pulmonary arterial pressure was 53/29 for a mean of 30 mm Hg. The catheter was advanced into the right lower lobe  pulmonary artery. Contrast injection was performed under fluoroscopy to confirm the anatomic position. A Rosen wire was advanced. Two EKOS 12 cm infusion length ultrasound assisted infusion catheters were then selected in advanced over the wires and positioned in the right and left lower lobe pulmonary arteries. Bilateral pulmonary arterial I cysts was then initiated at a rate of 0.5 milligrams/hour per catheter for a total rate of 1.0 milligrams/hour. The catheters were secured in place. The patient tolerated the procedure well. FINDINGS: 1. Multifocal bilateral pulmonary emboli. 2. Mean main pulmonary artery pressure 30 mm Hg consistent with pulmonary arterial hypertension. IMPRESSION: Successful initiation of bilateral pulmonary artery ultrasound accelerated catheter directed thrombolysis. Signed, Criselda Peaches, MD [facility] Radiology Electronically Signed   By: Jacqulynn Cadet M.D.   On: 02/07/2016 14:50   Ir Angiogram Selective Each Additional Vessel  Result Date: 02/07/2016 INDICATION: 75 year old female with sub massive pulmonary embolus and acute cor pulmonale. EXAM: IR INFUSION THROMBOL ARTERIAL INITIAL (MS); ADDITIONAL ARTERIOGRAPHY; IR ULTRASOUND GUIDANCE VASC ACCESS RIGHT; BILATERAL PULMONARY ARTERIOGRAPHY COMPARISON:  Chest CTA 02/06/2016 MEDICATIONS: None. ANESTHESIA/SEDATION: Versed 0.5 mg IV; Fentanyl 50 mcg IV Moderate Sedation Time:  53 minutes The patient was continuously monitored during the procedure by the interventional radiology nurse under my direct supervision. FLUOROSCOPY TIME:  Fluoroscopy Time: 6 minutes 18 seconds (66 mGy). COMPLICATIONS: None immediate. TECHNIQUE: Informed written consent was obtained from the patient after a thorough discussion of the procedural risks, benefits and alternatives. All questions were addressed. Maximal Sterile Barrier Technique was utilized including caps, mask, sterile gowns, sterile  gloves, sterile drape, hand hygiene and skin antiseptic. A timeout was performed prior to the initiation of the procedure. The right common femoral vein was interrogated with ultrasound. An image was obtained and stored for the medical record. Local anesthesia was attained by infiltration with 1% lidocaine. A small dermatotomy was made. Under real-time sonographic guidance, the vessel was punctured with a 21 gauge micropuncture needle. Using standard technique, the initial micro needle was exchanged over a 0.018 micro wire for a transitional 4 [place] micro sheath. The micro sheath was then exchanged over a 0.035 wire for a working 6 [place] vascular sheath. Using the same technique, a second ultrasound-guided puncture of the right common femoral vein was made slightly medial to the first. The micro needle was then exchanged over the same steps for a second working 6 [place] vascular sheath. There are now 2 separate sheaths in the right common femoral vein. Starting with more lateral sheath, a C2 cobra catheter was advanced over a a Bentson wire in used to navigate through the heart and into the left main pulmonary artery. A left main pulmonary arteriogram was performed. Multiple filling defects are identified consistent with multifocal PG. The catheter was then navigated into the left lower lobe pulmonary artery. Repeat arteriography was performed confirming the anatomic position. A Rosen wire was advanced into the left lower  lobe pulmonary artery through the catheter. Attention was turned to the more medial 6 French sheath. Again, a C2 cobra catheter was advanced over a Bentson wire into the heart and navigated through the heart and into the right main pulmonary artery. A right main pulmonary arteriogram was performed. There is significant outflow obstruction with a large clot at the bifurcation into the upper and lower lobe pulmonary arteries. Pressures were measured. The pulmonary arterial pressure was 53/29 for a  mean of 30 mm Hg. The catheter was advanced into the right lower lobe pulmonary artery. Contrast injection was performed under fluoroscopy to confirm the anatomic position. A Rosen wire was advanced. Two EKOS 12 cm infusion length ultrasound assisted infusion catheters were then selected in advanced over the wires and positioned in the right and left lower lobe pulmonary arteries. Bilateral pulmonary arterial I cysts was then initiated at a rate of 0.5 milligrams/hour per catheter for a total rate of 1.0 milligrams/hour. The catheters were secured in place. The patient tolerated the procedure well. FINDINGS: 1. Multifocal bilateral pulmonary emboli. 2. Mean main pulmonary artery pressure 30 mm Hg consistent with pulmonary arterial hypertension. IMPRESSION: Successful initiation of bilateral pulmonary artery ultrasound accelerated catheter directed thrombolysis. Signed, Criselda Peaches, MD [facility] Radiology Electronically Signed   By: Jacqulynn Cadet M.D.   On: 02/07/2016 14:50   Ir Angiogram Selective Each Additional Vessel  Result Date: 02/07/2016 INDICATION: 76 year old female with sub massive pulmonary embolus and acute cor pulmonale. EXAM: IR INFUSION THROMBOL ARTERIAL INITIAL (MS); ADDITIONAL ARTERIOGRAPHY; IR ULTRASOUND GUIDANCE VASC ACCESS RIGHT; BILATERAL PULMONARY ARTERIOGRAPHY COMPARISON:  Chest CTA 02/06/2016 MEDICATIONS: None. ANESTHESIA/SEDATION: Versed 0.5 mg IV; Fentanyl 50 mcg IV Moderate Sedation Time:  53 minutes The patient was continuously monitored during the procedure by the interventional radiology nurse under my direct supervision. FLUOROSCOPY TIME:  Fluoroscopy Time: 6 minutes 18 seconds (66 mGy). COMPLICATIONS: None immediate. TECHNIQUE: Informed written consent was obtained from the patient after a thorough discussion of the procedural risks, benefits and alternatives. All questions were addressed. Maximal Sterile Barrier  Technique was utilized including caps, mask, sterile gowns, sterile gloves, sterile drape, hand hygiene and skin antiseptic. A timeout was performed prior to the initiation of the procedure. The right common femoral vein was interrogated with ultrasound. An image was obtained and stored for the medical record. Local anesthesia was attained by infiltration with 1% lidocaine. A small dermatotomy was made. Under real-time sonographic guidance, the vessel was punctured with a 21 gauge micropuncture needle. Using standard technique, the initial micro needle was exchanged over a 0.018 micro wire for a transitional 4 [place] micro sheath. The micro sheath was then exchanged over a 0.035 wire for a working 6 [place] vascular sheath. Using the same technique, a second ultrasound-guided puncture of the right common femoral vein was made slightly medial to the first. The micro needle was then exchanged over the same steps for a second working 6 [place] vascular sheath. There are now 2 separate sheaths in the right common femoral vein. Starting with more lateral sheath, a C2 cobra catheter was advanced over a a Bentson wire in used to navigate through the heart and into the left main pulmonary artery. A left main pulmonary arteriogram was performed. Multiple filling defects are identified consistent with multifocal PG. The catheter was then navigated into the left lower lobe pulmonary artery. Repeat arteriography was performed confirming the anatomic position. A Rosen wire was advanced into the left lower lobe pulmonary artery  through the catheter. Attention was turned to the more medial 6 [place] sheath. Again, a C2 cobra catheter was advanced over a Bentson wire into the heart and navigated through the heart and into the right main pulmonary artery. A right main pulmonary arteriogram was performed. There is significant outflow obstruction with a large clot at the bifurcation into the upper and lower lobe pulmonary arteries.  Pressures were measured. The pulmonary arterial pressure was 53/29 for a mean of 30 mm Hg. The catheter was advanced into the right lower lobe pulmonary artery. Contrast injection was performed under fluoroscopy to confirm the anatomic position. A Rosen wire was advanced. Two EKOS 12 cm infusion length ultrasound assisted infusion catheters were then selected in advanced over the wires and positioned in the right and left lower lobe pulmonary arteries. Bilateral pulmonary arterial I cysts was then initiated at a rate of 0.5 milligrams/hour per catheter for a total rate of 1.0 milligrams/hour. The catheters were secured in place. The patient tolerated the procedure well. FINDINGS: 1. Multifocal bilateral pulmonary emboli. 2. Mean main pulmonary artery pressure 30 mm Hg consistent with pulmonary arterial hypertension. IMPRESSION: Successful initiation of bilateral pulmonary artery ultrasound accelerated catheter directed thrombolysis. Signed, Criselda Peaches, MD [facility] Radiology Electronically Signed   By: Jacqulynn Cadet M.D.   On: 02/07/2016 14:50   Nm Pulmonary Perf And Vent  Result Date: 02/04/2016 CLINICAL DATA:  Pulmonary hypertension EXAM: NUCLEAR MEDICINE VENTILATION - PERFUSION LUNG SCAN TECHNIQUE: Ventilation images were obtained in multiple projections using inhaled aerosol Tc-39m DTPA. Perfusion images were obtained in multiple projections after intravenous injection of Tc-47m MAA. RADIOPHARMACEUTICALS:  30.3 mCi Technetium-43m DTPA aerosol inhalation and 4.10 mCi Technetium-2m MAA IV COMPARISON:  Chest x-ray 02/03/2016 FINDINGS: Ventilation: No focal ventilation defect. Perfusion: There are at least 2 moderate wedge-shaped peripheral defects within the superior and posterior upper right lung. Additional small wedge-shaped perfusion defect posterior left upper lobe. There is cardiomegaly. IMPRESSION: Overall intermediate probability V/Q  scan. Electronically Signed   By: Donavan Foil M.D.   On: 02/04/2016 21:31   Ir US Guide Vasc Access Right  Result Date: 02/07/2016 INDICATION: 75 year old female with sub massive pulmonary embolus and acute cor pulmonale. EXAM: IR INFUSION THROMBOL ARTERIAL INITIAL (MS); ADDITIONAL ARTERIOGRAPHY; IR ULTRASOUND GUIDANCE VASC ACCESS RIGHT; BILATERAL PULMONARY ARTERIOGRAPHY COMPARISON:  Chest CTA 02/06/2016 MEDICATIONS: None. ANESTHESIA/SEDATION: Versed 0.5 mg IV; Fentanyl 50 mcg IV Moderate Sedation Time:  53 minutes The patient was continuously monitored during the procedure by the interventional radiology nurse under my direct supervision. FLUOROSCOPY TIME:  Fluoroscopy Time: 6 minutes 18 seconds (66 mGy). COMPLICATIONS: None immediate. TECHNIQUE: Informed written consent was obtained from the patient after a thorough discussion of the procedural risks, benefits and alternatives. All questions were addressed. Maximal Sterile Barrier Technique was utilized including caps, mask, sterile gowns, sterile gloves, sterile drape, hand hygiene and skin antiseptic. A timeout was performed prior to the initiation of the procedure. The right common femoral vein was interrogated with ultrasound. An image was obtained and stored for the medical record. Local anesthesia was attained by infiltration with 1% lidocaine. A small dermatotomy was made. Under real-time sonographic guidance, the vessel was punctured with a 21 gauge micropuncture needle. Using standard technique, the initial micro needle was exchanged over a 0.018 micro wire for a transitional 4 [place] micro sheath. The micro sheath was then exchanged over a 0.035 wire for a working 6 [place] vascular sheath. Using the same technique, a second ultrasound-guided  puncture of the right common femoral vein was made slightly medial to the first. The micro needle was then exchanged over the same steps for a second working 6 [place] vascular sheath. There are now 2 separate  sheaths in the right common femoral vein. Starting with more lateral sheath, a C2 cobra catheter was advanced over a a Bentson wire in used to navigate through the heart and into the left main pulmonary artery. A left main pulmonary arteriogram was performed. Multiple filling defects are identified consistent with multifocal PG. The catheter was then navigated into the left lower lobe pulmonary artery. Repeat arteriography was performed confirming the anatomic position. A Rosen wire was advanced into the left lower lobe pulmonary artery through the catheter. Attention was turned to the more medial 6 [place] sheath. Again, a C2 cobra catheter was advanced over a Bentson wire into the heart and navigated through the heart and into the right main pulmonary artery. A right main pulmonary arteriogram was performed. There is significant outflow obstruction with a large clot at the bifurcation into the upper and lower lobe pulmonary arteries. Pressures were measured. The pulmonary arterial pressure was 53/29 for a mean of 30 mm Hg. The catheter was advanced into the right lower lobe pulmonary artery. Contrast injection was performed under fluoroscopy to confirm the anatomic position. A Rosen wire was advanced. Two EKOS 12 cm infusion length ultrasound assisted infusion catheters were then selected in advanced over the wires and positioned in the right and left lower lobe pulmonary arteries. Bilateral pulmonary arterial I cysts was then initiated at a rate of 0.5 milligrams/hour per catheter for a total rate of 1.0 milligrams/hour. The catheters were secured in place. The patient tolerated the procedure well. FINDINGS: 1. Multifocal bilateral pulmonary emboli. 2. Mean main pulmonary artery pressure 30 mm Hg consistent with pulmonary arterial hypertension. IMPRESSION: Successful initiation of bilateral pulmonary artery ultrasound accelerated catheter directed thrombolysis. Signed, Criselda Peaches, MD [facility] Radiology Electronically Signed   By: Jacqulynn Cadet M.D.   On: 02/07/2016 14:50   Dg Chest Port 1 View  Result Date: 02/08/2016 CLINICAL DATA:  75 year old female with sub massive pulmonary embolus status post bilateral pulmonary artery ultrasound accelerated catheter directed thrombolysis yesterday. Initial encounter. EXAM: PORTABLE CHEST 1 VIEW COMPARISON:  Chest CTA 02/06/2016. Chest radiographs 02/03/2016 and earlier. FINDINGS: Portable AP supine view at 0434 hours. Stable cardiomegaly and mediastinal contours. Relatively stable lung volumes compared to 02/03/2016. Allowing for portable technique the lungs are clear. No pneumothorax or pleural effusion. Visualized tracheal air column is within normal limits. IMPRESSION: Stable cardiomegaly and no acute cardiopulmonary abnormality following pulmonary artery catheter directed thrombolysis. Electronically Signed   By: Genevie Ann M.D.   On: 02/08/2016 06:36   Ir Infusion Thrombol Arterial Initial (ms)  Result Date: 02/07/2016 INDICATION: 75 year old female with sub massive pulmonary embolus and acute cor pulmonale. EXAM: IR INFUSION THROMBOL ARTERIAL INITIAL (MS); ADDITIONAL ARTERIOGRAPHY; IR ULTRASOUND GUIDANCE VASC ACCESS RIGHT; BILATERAL PULMONARY ARTERIOGRAPHY COMPARISON:  Chest CTA 02/06/2016 MEDICATIONS: None. ANESTHESIA/SEDATION: Versed 0.5 mg IV; Fentanyl 50 mcg IV Moderate Sedation Time:  53 minutes The patient was continuously monitored during the procedure by the interventional radiology nurse under my direct supervision. FLUOROSCOPY TIME:  Fluoroscopy Time: 6 minutes 18 seconds (66 mGy). COMPLICATIONS: None immediate. TECHNIQUE: Informed written consent was obtained from the patient after a thorough discussion of the procedural risks, benefits and alternatives. All questions were addressed. Maximal Sterile Barrier Technique was utilized including caps,  mask, sterile gowns, sterile gloves, sterile  drape, hand hygiene and skin antiseptic. A timeout was performed prior to the initiation of the procedure. The right common femoral vein was interrogated with ultrasound. An image was obtained and stored for the medical record. Local anesthesia was attained by infiltration with 1% lidocaine. A small dermatotomy was made. Under real-time sonographic guidance, the vessel was punctured with a 21 gauge micropuncture needle. Using standard technique, the initial micro needle was exchanged over a 0.018 micro wire for a transitional 4 [place] micro sheath. The micro sheath was then exchanged over a 0.035 wire for a working 6 [place] vascular sheath. Using the same technique, a second ultrasound-guided puncture of the right common femoral vein was made slightly medial to the first. The micro needle was then exchanged over the same steps for a second working 6 [place] vascular sheath. There are now 2 separate sheaths in the right common femoral vein. Starting with more lateral sheath, a C2 cobra catheter was advanced over a a Bentson wire in used to navigate through the heart and into the left main pulmonary artery. A left main pulmonary arteriogram was performed. Multiple filling defects are identified consistent with multifocal PG. The catheter was then navigated into the left lower lobe pulmonary artery. Repeat arteriography was performed confirming the anatomic position. A Rosen wire was advanced into the left lower lobe pulmonary artery through the catheter. Attention was turned to the more medial 6 French sheath. Again, a C2 cobra catheter was advanced over a Bentson wire into the heart and navigated through the heart and into the right main pulmonary artery. A right main pulmonary arteriogram was performed. There is significant outflow obstruction with a large clot at the bifurcation into the upper and lower lobe pulmonary arteries. Pressures were measured. The pulmonary arterial pressure was 53/29 for a mean of 30 mm  Hg. The catheter was advanced into the right lower lobe pulmonary artery. Contrast injection was performed under fluoroscopy to confirm the anatomic position. A Rosen wire was advanced. Two EKOS 12 cm infusion length ultrasound assisted infusion catheters were then selected in advanced over the wires and positioned in the right and left lower lobe pulmonary arteries. Bilateral pulmonary arterial I cysts was then initiated at a rate of 0.5 milligrams/hour per catheter for a total rate of 1.0 milligrams/hour. The catheters were secured in place. The patient tolerated the procedure well. FINDINGS: 1. Multifocal bilateral pulmonary emboli. 2. Mean main pulmonary artery pressure 30 mm Hg consistent with pulmonary arterial hypertension. IMPRESSION: Successful initiation of bilateral pulmonary artery ultrasound accelerated catheter directed thrombolysis. Signed, Criselda Peaches, MD [facility] Radiology Electronically Signed   By: Jacqulynn Cadet M.D.   On: 02/07/2016 14:50   Ir Infusion Thrombol Arterial Initial (ms)  Result Date: 02/07/2016 INDICATION: 75 year old female with sub massive pulmonary embolus and acute cor pulmonale. EXAM: IR INFUSION THROMBOL ARTERIAL INITIAL (MS); ADDITIONAL ARTERIOGRAPHY; IR ULTRASOUND GUIDANCE VASC ACCESS RIGHT; BILATERAL PULMONARY ARTERIOGRAPHY COMPARISON:  Chest CTA 02/06/2016 MEDICATIONS: None. ANESTHESIA/SEDATION: Versed 0.5 mg IV; Fentanyl 50 mcg IV Moderate Sedation Time:  53 minutes The patient was continuously monitored during the procedure by the interventional radiology nurse under my direct supervision. FLUOROSCOPY TIME:  Fluoroscopy Time: 6 minutes 18 seconds (66 mGy). COMPLICATIONS: None immediate. TECHNIQUE: Informed written consent was obtained from the patient after a thorough discussion of the procedural risks, benefits and alternatives. All questions were addressed. Maximal Sterile Barrier Technique was  utilized including caps, mask, sterile  gowns, sterile gloves, sterile drape, hand hygiene and skin antiseptic. A timeout was performed prior to the initiation of the procedure. The right common femoral vein was interrogated with ultrasound. An image was obtained and stored for the medical record. Local anesthesia was attained by infiltration with 1% lidocaine. A small dermatotomy was made. Under real-time sonographic guidance, the vessel was punctured with a 21 gauge micropuncture needle. Using standard technique, the initial micro needle was exchanged over a 0.018 micro wire for a transitional 4 [place] micro sheath. The micro sheath was then exchanged over a 0.035 wire for a working 6 [place] vascular sheath. Using the same technique, a second ultrasound-guided puncture of the right common femoral vein was made slightly medial to the first. The micro needle was then exchanged over the same steps for a second working 6 [place] vascular sheath. There are now 2 separate sheaths in the right common femoral vein. Starting with more lateral sheath, a C2 cobra catheter was advanced over a a Bentson wire in used to navigate through the heart and into the left main pulmonary artery. A left main pulmonary arteriogram was performed. Multiple filling defects are identified consistent with multifocal PG. The catheter was then navigated into the left lower lobe pulmonary artery. Repeat arteriography was performed confirming the anatomic position. A Rosen wire was advanced into the left lower lobe pulmonary artery through the catheter. Attention was turned to the more medial 6 French sheath. Again, a C2 cobra catheter was advanced over a Bentson wire into the heart and navigated through the heart and into the right main pulmonary artery. A right main pulmonary arteriogram was performed. There is significant outflow obstruction with a large clot at the bifurcation into the upper and lower lobe pulmonary arteries. Pressures were  measured. The pulmonary arterial pressure was 53/29 for a mean of 30 mm Hg. The catheter was advanced into the right lower lobe pulmonary artery. Contrast injection was performed under fluoroscopy to confirm the anatomic position. A Rosen wire was advanced. Two EKOS 12 cm infusion length ultrasound assisted infusion catheters were then selected in advanced over the wires and positioned in the right and left lower lobe pulmonary arteries. Bilateral pulmonary arterial I cysts was then initiated at a rate of 0.5 milligrams/hour per catheter for a total rate of 1.0 milligrams/hour. The catheters were secured in place. The patient tolerated the procedure well. FINDINGS: 1. Multifocal bilateral pulmonary emboli. 2. Mean main pulmonary artery pressure 30 mm Hg consistent with pulmonary arterial hypertension. IMPRESSION: Successful initiation of bilateral pulmonary artery ultrasound accelerated catheter directed thrombolysis. Signed, Criselda Peaches, MD [facility] Radiology Electronically Signed   By: Jacqulynn Cadet M.D.   On: 02/07/2016 14:50    Microbiology: Recent Results (from the past 240 hour(s))  MRSA PCR Screening     Status: None   Collection Time: 02/07/16 12:37 PM  Result Value Ref Range Status   MRSA by PCR NEGATIVE NEGATIVE Final    Comment:        The GeneXpert MRSA Assay (FDA approved for NASAL specimens only), is one component of a comprehensive MRSA colonization surveillance program. It is not intended to diagnose MRSA infection nor to guide or monitor treatment for MRSA infections.      Labs: Basic Metabolic Panel:  Recent Labs Lab 02/04/16 1633 02/05/16 0301 02/08/16 0038 02/09/16 0144 02/10/16 0217  NA  --  140 139 138 139  K  --  3.7 3.3* 3.6 3.4*  CL  --  111 110 109 111  CO2  --  21* 23 22 18*  GLUCOSE  --  116* 123* 100* 106*  BUN  --  10 5* 5* 8  CREATININE 0.79 0.79 0.66 0.65 0.64  CALCIUM  --  8.7* 8.0*  8.3* 8.9   Liver Function Tests: No results for input(s): AST, ALT, ALKPHOS, BILITOT, PROT, ALBUMIN in the last 168 hours. No results for input(s): LIPASE, AMYLASE in the last 168 hours. No results for input(s): AMMONIA in the last 168 hours. CBC:  Recent Labs Lab 02/08/16 0500 02/08/16 1222 02/08/16 1819 02/09/16 0144 02/10/16 0217  WBC 7.0 6.5 6.3 7.0 7.1  HGB 8.5* 8.7* 9.2* 8.5* 9.2*  HCT 27.6* 28.1* 29.7* 27.3* 29.8*  MCV 75.4* 75.5* 75.8* 75.4* 75.4*  PLT 204 194 213 230 278   Cardiac Enzymes:  Recent Labs Lab 02/04/16 1633  TROPONINI 0.27*   BNP: BNP (last 3 results)  Recent Labs  02/03/16 1619  BNP 477.2*    ProBNP (last 3 results) No results for input(s): PROBNP in the last 8760 hours.  CBG:  Recent Labs Lab 02/07/16 1258  GLUCAP 117*       Signed:  Dia Crawford, MD [facility] [phone] pager

## 2016-02-11 NOTE — Progress Notes (Signed)
Order received to discharge patient.  Patient expresses readiness to discharge.  Discharge instructions, follow up, medications and instructions for their use were discussed with patient and patient voiced understanding.  Telemetry monitor was removed and CCMD notified.  PIV access removed without difficulty.

## 2016-02-11 NOTE — Care Management Note (Signed)
Case Management Note Marvetta Gibbons RN, BSN Unit 2W-Case Manager [phone]  Patient Details  Name: Catherine Snyder MRN: [id number] Date of Birth: [birth year]  Subjective/Objective:  Pt admitted with PE  S/p TPA                  Action/Plan: PTA pt lived at home with family- plan to return home- has been started on Xarelto- per insurance check - S/W [facility] [place]  #   [phone]   XARELTO 20 MG DAILY  30/30 TAB   COVER- YES  CO-PAY- $ 39.36  TIER- 3 DRUG  PRIOR APPROVAL - YES # [id number]  PHARMACY: [facility], [facility], [place] with pt and daughter at bedside- coverage info shared for Xarelto and 30 day free card provided to use on discharge- pt has been using a RW here- and would like one for discharge- MD notified and to place order- have called [facility] on [facility] for DME need- RW to be delivered to room prior to discharge  Expected Discharge Date:     02/11/16             Expected Discharge Plan:  Home/Self Care  In-House Referral:     Discharge planning Services  CM Consult, Medication Assistance  Post Acute Care Choice:  Durable Medical Equipment Choice offered to:  Patient, Adult Children  DME Arranged:  [place] rolling DME Agency:  [place]:    [place] Agency:     Status of Service:  Completed, signed off  If discussed at [place] of Stay Meetings, dates discussed:    Discharge Disposition: home/self care   Additional Comments:  Dawayne Patricia, RN 02/11/2016, 11:51 AM

## 2016-02-11 NOTE — Progress Notes (Signed)
SATURATION QUALIFICATIONS: (This note is used to comply with regulatory documentation for home oxygen)  Patient Saturations on Room Air at Rest = 100%  Patient Saturations on Room Air while Ambulating = 99%  Patient Saturations on 0 Liters of oxygen while Ambulating = 98%  Please briefly explain why patient needs home oxygen:  Patient maintained O2 saturation during ambulation with no supplemental oxygen.

## 2016-02-13 ENCOUNTER — Ambulatory Visit (INDEPENDENT_AMBULATORY_CARE_PROVIDER_SITE_OTHER): Payer: PPO | Admitting: Internal Medicine

## 2016-02-13 ENCOUNTER — Encounter: Payer: Self-pay | Admitting: Internal Medicine

## 2016-02-13 VITALS — BP 120/82 | HR 68 | Temp 98.2°F | Ht 63.0 in | Wt 166.8 lb

## 2016-02-13 DIAGNOSIS — I2510 Atherosclerotic heart disease of native coronary artery without angina pectoris: Secondary | ICD-10-CM | POA: Diagnosis not present

## 2016-02-13 DIAGNOSIS — I2699 Other pulmonary embolism without acute cor pulmonale: Secondary | ICD-10-CM

## 2016-02-13 DIAGNOSIS — I2729 Other secondary pulmonary hypertension: Secondary | ICD-10-CM | POA: Diagnosis not present

## 2016-02-13 DIAGNOSIS — I2692 Saddle embolus of pulmonary artery without acute cor pulmonale: Secondary | ICD-10-CM

## 2016-02-13 DIAGNOSIS — I10 Essential (primary) hypertension: Secondary | ICD-10-CM | POA: Diagnosis not present

## 2016-02-13 DIAGNOSIS — D509 Iron deficiency anemia, unspecified: Secondary | ICD-10-CM | POA: Diagnosis not present

## 2016-02-13 DIAGNOSIS — R748 Abnormal levels of other serum enzymes: Secondary | ICD-10-CM | POA: Diagnosis not present

## 2016-02-13 DIAGNOSIS — R778 Other specified abnormalities of plasma proteins: Secondary | ICD-10-CM

## 2016-02-13 DIAGNOSIS — R0609 Other forms of dyspnea: Secondary | ICD-10-CM

## 2016-02-13 DIAGNOSIS — R5381 Other malaise: Secondary | ICD-10-CM | POA: Diagnosis not present

## 2016-02-13 DIAGNOSIS — R7989 Other specified abnormal findings of blood chemistry: Secondary | ICD-10-CM

## 2016-02-13 MED ORDER — AMLODIPINE BESYLATE 10 MG PO TABS: 10.0000 mg | ORAL_TABLET | Freq: Every day | ORAL | 1 refills | Status: DC

## 2016-02-13 NOTE — Patient Instructions (Addendum)
Follow up with specialists as scheduled  Resume activity as tolerated  Will get home health PT  Needs shower chair  Use walker when out and about  Follow up in 1 month for pulmonary embolism

## 2016-02-13 NOTE — Progress Notes (Signed)
Patient ID: ANNELISE MCCOY, female   DOB: Jul 29, 1940, 75 y.o.   MRN: [id number]    Location:  [facility] of Service: OFFICE  Chief Complaint  Patient presents with  . Hospitalization Follow-up    Here with daughter  . Other    patient is taking Xarelto 26m when shes done with that she will start 248m  . Flu Vaccine    Refused    HPI:  [age]o female seen today for hospital f/u for submassive b/l PE with right heart strain, pulmonary HTN, nonobstructive CAD, elevated Trp I, iron deficiency anemia. She underwent US[place]anced thrombolysis (EKOS) on 11/25th. She has significant bleeding at sheath sites and tx stopped. 2D echo revealed RV dysfunction and PASP 67 mm Hg. Cardio  Dr KeClaiborne Billingsonsulted. She had cardiac cath 11/22 which showed nonobstructive CAD. She had VQ scan -->CTA chest PE protocol showing b/l large PE with evidence of right heart strain. She was given 2 units PRBCs post EKOS. Hgb went as low as 8.5 --> 9.2 at d/c. Trp 0.47-->0.28  Today, she reports improved fatigue and SOB. No bleeding. no other concerns  HTN - BP controlled on amlodipine. She takes ASA daily  Bladder cancer (dx March 2017) - s/p resection. Prior to dx, She had intermittent hematuria (passing blood clots) at end of urine stream x 1 yr. No dysuria but had increased frequency. Non smoker. Followed by urology  [place]  CAD - nonobstructive. Cath done 02/04/16. She takes lipitor for hyperlipidemia. She has prn SLNTG  Anemia - Hgb 9.2. She is s/p 2 units PRBCs. 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 On xeralto until June 2018  Past Medical History:  Diagnosis Date  . Bladder cancer ([place]   a. s/p resection in 06/2015  . Dysuria   . Frequency of urination   . Hematuria   . Hypertension   . Nocturia   . Wears glasses   . Wears partial dentures    upper    Past Surgical History:  Procedure Laterality Date  . CARDIAC CATHETERIZATION N/A  02/04/2016   Procedure: RIGHT/LEFT HEART CATH AND CORONARY ANGIOGRAPHY;  Surgeon: ThTroy SineMD;  [facility];  Service: Cardiovascular;  Laterality: N/A;  . CYSTOSCOPY W/ RETROGRADES Bilateral 07/07/2015   Procedure: CYSTOSCOPY WITH RETROGRADE PYELOGRAM;  Surgeon: PaCleon GustinMD;  [facility];  Laterality: Bilateral;  . IR GENERIC HISTORICAL  02/07/2016   IR ANGIOGRAM SELECTIVE EACH ADDITIONAL VESSEL 02/07/2016 HeJacqulynn CadetMD [facility]  . IR GENERIC HISTORICAL  02/07/2016   IR US[place]UIDE VASC ACCESS RIGHT 02/07/2016 HeJacqulynn CadetMD [facility]  . IR GENERIC HISTORICAL  02/07/2016   IR ANGIOGRAM PULMONARY BILATERAL SELECTIVE 02/07/2016 HeJacqulynn CadetMD [facility]  . IR GENERIC HISTORICAL  02/07/2016   IR INFUSION THROMBOL ARTERIAL INITIAL (MS) 02/07/2016 HeJacqulynn CadetMD [facility]  . IR GENERIC HISTORICAL  02/07/2016   IR INFUSION THROMBOL ARTERIAL INITIAL (MS) 02/07/2016 HeJacqulynn CadetMD [facility]  . IR GENERIC HISTORICAL  02/07/2016   IR ANGIOGRAM SELECTIVE EACH ADDITIONAL VESSEL 02/07/2016 HeJacqulynn CadetMD [facility]  . NO PAST SURGERIES    . TRANSURETHRAL RESECTION OF BLADDER TUMOR WITH GYRUS (TURBT-GYRUS) N/A 07/07/2015   Procedure: TRANSURETHRAL RESECTION OF BLADDER TUMOR WITH GYRUS (TURBT-GYRUS);  Surgeon: PaCleon GustinMD;  [facility];  Laterality: N/A;    Patient Care Team: Gildardo Cranker, DO as PCP - General (Internal Medicine)  Social History   Social History  . Marital status: Single    Spouse name: N/A  . Number of children: N/A  . Years of education: N/A   Occupational History  . Retired Automotive engineer    Social History Main Topics  . Smoking status: Never Smoker  . Smokeless tobacco: Never Used  . Alcohol use No  . Drug use: No  . Sexual activity: No   Other Topics Concern  . Not on file   Social  History Narrative    Diet:   Do you drink/eat things with caffeine? Yes coffee   Marital status: Divorced                              What year were you married?    Do you live in a house, apartment, assisted living, condo, trailer, etc)? House   Is it one or more stories? 1 story   How many persons live in your home? 4   Do you have any pets in your home? 0   Current or past profession: Dietitian   Do you exercise? Yes                                       Type & how often:  Walk daily   Do you have a living will? No   Do you have a DNR Form? No   Do you have a POA/HPOA forms? No     reports that she has never smoked. She has never used smokeless tobacco. She reports that she does not drink alcohol or use drugs.  Family History  Problem Relation Age of Onset  . CAD Mother 15  . Heart disease Father 49  . CAD Brother 59   Family Status  Relation Status  . Mother Deceased  . Father Deceased  . Sister Alive  . Brother Alive  . Daughter Alive  . Son Alive  . Sister Alive  . Sister Alive  . Brother Deceased  . Brother Deceased  . Brother Deceased  . Brother Deceased  . Brother Deceased     No Known Allergies  Medications: Patient's Medications  New Prescriptions   No medications on file  Previous Medications   AMLODIPINE (NORVASC) 10 MG TABLET    take 1 tablet by mouth once daily   ASPIRIN 81 MG CHEWABLE TABLET    Chew 81 mg by mouth daily.   ATORVASTATIN (LIPITOR) 80 MG TABLET    Take 1 tablet (80 mg total) by mouth daily at 6 PM.   NITROGLYCERIN (NITROSTAT) 0.4 MG SL TABLET    Place 1 tablet (0.4 mg total) under the tongue every 5 (five) minutes x 3 doses as needed for chest pain.   RIVAROXABAN (XARELTO) 15 MG TABS TABLET    Take 1 tablet (15 mg total) by mouth 2 (two) times daily with a meal.   RIVAROXABAN (XARELTO) 20 MG TABS TABLET    Take 1 tablet (20 mg total) by mouth daily with supper.  Modified Medications   No medications on file  Discontinued  Medications   No medications on file    Review of Systems  Constitutional: Positive for fatigue.  Respiratory: Positive for shortness of breath.   All other  systems reviewed and are negative.   Vitals:   02/13/16 0935  BP: 120/82  Pulse: 68  Temp: 98.2 F (36.8 C)  TempSrc: Oral  SpO2: 98%  Weight: 166 lb 12.8 oz (75.7 kg)  Height: '5\' 3"'  (1.6 m)   Body mass index is 29.55 kg/m.  Physical Exam  Constitutional: She is oriented to person, place, and time. She appears well-developed and well-nourished. No distress.  HENT:  Mouth/Throat: Oropharynx is clear and moist. No oropharyngeal exudate.  Eyes: Pupils are equal, round, and reactive to light. No scleral icterus.  Neck: Neck supple.  Cardiovascular: Normal rate, regular rhythm and intact distal pulses.  Exam reveals no gallop and no friction rub.   Murmur (1/6 SEM) heard. Right inguinal pressure pack present and removed without complication.noLE edema b/l. nocalf TTP. nopalpablecords  Pulmonary/Chest: Effort normal and breath sounds normal. No respiratory distress. She has no wheezes. She has no rales. She exhibits no tenderness.  Abdominal: Soft. Bowel sounds are normal. She exhibits no distension and no mass. There is no tenderness (no CVAT or suprapubic TTP). There is no rebound and no guarding.  No pulsatile mass or bruit noted  Musculoskeletal: She exhibits edema.  Lymphadenopathy:    She has no cervical adenopathy.  Neurological: She is alert and oriented to person, place, and time.  Skin: Skin is warm and dry. No rash noted.  Psychiatric: She has a normal mood and affect. Her behavior is normal. Thought content normal.     Labs reviewed: Preadmission on 02/09/2016  Component Date Value Ref Range Status  . Cath EF Quantitative 02/04/2016 50  % Final  Admission on 02/03/2016, Discharged on 02/11/2016  No results displayed because visit has over 200 results.    Office Visit on 02/03/2016  Component Date Value  Ref Range Status  . Sodium 02/03/2016 140  135 - 146 mmol/L Final  . Potassium 02/03/2016 3.4* 3.5 - 5.3 mmol/L Final  . Chloride 02/03/2016 106  98 - 110 mmol/L Final  . CO2 02/03/2016 23  20 - 31 mmol/L Final  . Glucose, Bld 02/03/2016 109* 65 - 99 mg/dL Final  . BUN 02/03/2016 15  7 - 25 mg/dL Final  . Creat 02/03/2016 0.84  0.60 - 0.93 mg/dL Final   Comment:   For patients > or = 74 years of age: The upper reference limit for Creatinine is approximately 13% higher for people identified as African-American.     . Calcium 02/03/2016 8.9  8.6 - 10.4 mg/dL Final  . GFR, Est African American 02/03/2016 79  >=60 mL/min Final  . GFR, Est Non African American 02/03/2016 69  >=60 mL/min Final  . Brain Natriuretic Peptide 02/03/2016 477.2* <100 pg/mL Final   Comment:   BNP levels increase with age in the general population with the highest values seen in individuals greater than 73 years of age. Reference: Joellyn Rued Cardiol 2002; 80:998-33.     . WBC 02/03/2016 5.0  3.8 - 10.8 K/uL Final  . RBC 02/03/2016 4.87  3.80 - 5.10 MIL/uL Final  . Hemoglobin 02/03/2016 10.7* 11.7 - 15.5 g/dL Final  . HCT 02/03/2016 34.4* 35.0 - 45.0 % Final  . MCV 02/03/2016 70.6* 80.0 - 100.0 fL Final  . MCH 02/03/2016 22.0* 27.0 - 33.0 pg Final  . MCHC 02/03/2016 31.1* 32.0 - 36.0 g/dL Final  . RDW 02/03/2016 20.6* 11.0 - 15.0 % Final  . Platelets 02/03/2016 261  140 - 400 K/uL Final  . MPV 02/03/2016 9.0  7.5 - 12.5 fL Final  . Neutro Abs 02/03/2016 2900  1,500 - 7,800 cells/uL Final  . Lymphs Abs 02/03/2016 1600  850 - 3,900 cells/uL Final  . Monocytes Absolute 02/03/2016 450  200 - 950 cells/uL Final  . Eosinophils Absolute 02/03/2016 50  15 - 500 cells/uL Final  . Basophils Absolute 02/03/2016 0  0 - 200 cells/uL Final  . Neutrophils Relative % 02/03/2016 58  % Final  . Lymphocytes Relative 02/03/2016 32  % Final  . Monocytes Relative 02/03/2016 9  % Final  . Eosinophils Relative 02/03/2016 1  %  Final  . Basophils Relative 02/03/2016 0  % Final  . Smear Review 02/03/2016 Criteria for review not met   Final  . aPTT 02/03/2016 25  22 - 34 sec Final   Comment:   This test has not been validated for monitoring unfractionated heparin therapy. For testing that is validated for this type of therapy, please refer to the Heparin Anti-Xa assay (test code [phone]).   For additional information, please refer to http://education.[facility].com/faq/FAQ159 (This link is being provided for informational/educational purposes only.)     . Prothrombin Time 02/03/2016 12.5* 9.0 - 11.5 sec Final   Comment:   For more information on this test, go to: http://education.[facility].com/faq/FAQ104     . INR 02/03/2016 1.2*  Final   Comment:   Reference Range                        0.9-1.1 Moderate-intensity Warfarin Therapy    2.0-3.0 Higher-intensity Warfarin Therapy      3.0-4.0     . TSH 02/03/2016 1.87  mIU/L Final   Comment:   Reference Range   > or = 20 Years  0.40-4.50   Pregnancy Range First trimester  0.26-2.66 Second trimester 0.55-2.73 Third trimester  0.43-2.91     . Troponin I 02/03/2016 0.43* <=0.05 ng/mL Final   Comment:   In accord with published recommendations, serial testing of Troponin I at intervals of 2 to 4 hours for up to 12 to 24 hours is suggested in order to corroborate a single Troponin I result. An elevated troponin alone is not sufficient to make the diagnosis of MI. ** Please note change in reference range(s). **     Office Visit on 02/02/2016  Component Date Value Ref Range Status  . WBC 02/02/2016 5.4  3.8 - 10.8 K/uL Final  . RBC 02/02/2016 4.72  3.80 - 5.10 MIL/uL Final   Comment: Polychromasia present (1-2/hpf) Mixed RBC population   . Hemoglobin 02/02/2016 10.7* 11.7 - 15.5 g/dL Final  . HCT 02/02/2016 34.8* 35.0 - 45.0 % Final  . MCV 02/02/2016 73.7* 80.0 - 100.0 fL Final  . MCH 02/02/2016 22.7* 27.0 - 33.0 pg Final  . MCHC  02/02/2016 30.7* 32.0 - 36.0 g/dL Final  . RDW 02/02/2016 20.3* 11.0 - 15.0 % Final  . Platelets 02/02/2016 277  140 - 400 K/uL Final  . MPV 02/02/2016 9.4  7.5 - 12.5 fL Final  . Neutro Abs 02/02/2016 2862  1,500 - 7,800 cells/uL Final  . Lymphs Abs 02/02/2016 1836  850 - 3,900 cells/uL Final  . Monocytes Absolute 02/02/2016 648  200 - 950 cells/uL Final  . Eosinophils Absolute 02/02/2016 54  15 - 500 cells/uL Final  . Basophils Absolute 02/02/2016 0  0 - 200 cells/uL Final  . Neutrophils Relative % 02/02/2016 53  % Final  . Lymphocytes Relative 02/02/2016 34  % Final  .  Monocytes Relative 02/02/2016 12  % Final  . Eosinophils Relative 02/02/2016 1  % Final  . Basophils Relative 02/02/2016 0  % Final  . Smear Review 02/02/2016 Criteria for review not met   Final  . Sodium 02/02/2016 140  135 - 146 mmol/L Final  . Potassium 02/02/2016 4.1  3.5 - 5.3 mmol/L Final  . Chloride 02/02/2016 107  98 - 110 mmol/L Final  . CO2 02/02/2016 25  20 - 31 mmol/L Final  . Glucose, Bld 02/02/2016 116* 65 - 99 mg/dL Final  . BUN 02/02/2016 13  7 - 25 mg/dL Final  . Creat 02/02/2016 0.76  0.60 - 0.93 mg/dL Final   Comment:   For patients > or = 74 years of age: The upper reference limit for Creatinine is approximately 13% higher for people identified as African-American.     . Calcium 02/02/2016 9.4  8.6 - 10.4 mg/dL Final  . GFR, Est African American 02/02/2016 89  >=60 mL/min Final  . GFR, Est Non African American 02/02/2016 78  >=60 mL/min Final    Dg Chest 2 View  Result Date: 02/03/2016 CLINICAL DATA:  Shortness of breath EXAM: CHEST  2 VIEW COMPARISON:  None. FINDINGS: No acute infiltrate, consolidation, or pleural effusion. Mild cardiomegaly without overt failure. Tortuous ectatic aorta with atherosclerosis. No pneumothorax. IMPRESSION: 1. Mild cardiomegaly without overt failure 2. Mildly tortuous aorta with atherosclerosis 3. No acute infiltrate Electronically Signed   By: Donavan Foil M.D.    On: 02/03/2016 22:18   Ct Angio Chest Pe W Or Wo Contrast  Result Date: 02/06/2016 CLINICAL DATA:  Shortness of Breath, of positive V/Q scan EXAM: CT ANGIOGRAPHY CHEST WITH CONTRAST TECHNIQUE: Multidetector CT imaging of the chest was performed using the standard protocol during bolus administration of intravenous contrast. Multiplanar CT image reconstructions and MIPs were obtained to evaluate the vascular anatomy. CONTRAST:  80 mL Isovue 370. COMPARISON:  02/04/2016 FINDINGS: Cardiovascular: Thoracic aorta shows diffuse atherosclerotic calcifications without aneurysmal dilatation. Pulmonary artery is well visualized and demonstrates significant bilateral pulmonary emboli. There is dilatation of the right ventricle with a an RV/LV ratio of 1.8 consistent with right heart strain. Coronary calcifications are noted. Mediastinum/Nodes: Thoracic inlet is within normal limits. No significant hilar or mediastinal adenopathy is noted. No axillary adenopathy is seen. Lungs/Pleura: Lungs are well aerated bilaterally without focal confluent infiltrate or sizable effusion. No findings to suggest pulmonary infarct are noted. No pneumothorax is seen. Upper Abdomen: Within normal limits. Musculoskeletal: Within normal limits. Review of the MIP images confirms the above findings. IMPRESSION: Positive for acute PE with CT evidence of right heart strain (RV/LV Ratio = 1.8) consistent with at least submassive (intermediate risk) PE. The presence of right heart strain has been associated with an increased risk of morbidity and mortality. Please activate Code PE by paging [phone]. Critical Value/emergent results were called by telephone at the time of interpretation on 02/06/2016 at 12:30 pm to Dr. Ellyn Hack, who verbally acknowledged these results. Electronically Signed   By: Inez Catalina M.D.   On: 02/06/2016 12:31   Ir Angiogram Pulmonary Bilateral Selective  Result Date: 02/07/2016 INDICATION: 75 year old female with  sub massive pulmonary embolus and acute cor pulmonale. EXAM: IR INFUSION THROMBOL ARTERIAL INITIAL (MS); ADDITIONAL ARTERIOGRAPHY; IR ULTRASOUND GUIDANCE VASC ACCESS RIGHT; BILATERAL PULMONARY ARTERIOGRAPHY COMPARISON:  Chest CTA 02/06/2016 MEDICATIONS: None. ANESTHESIA/SEDATION: Versed 0.5 mg IV; Fentanyl 50 mcg IV Moderate Sedation Time:  53 minutes The patient was continuously monitored during the procedure by the  interventional radiology nurse under my direct supervision. FLUOROSCOPY TIME:  Fluoroscopy Time: 6 minutes 18 seconds (66 mGy). COMPLICATIONS: None immediate. TECHNIQUE: Informed written consent was obtained from the patient after a thorough discussion of the procedural risks, benefits and alternatives. All questions were addressed. Maximal Sterile Barrier Technique was utilized including caps, mask, sterile gowns, sterile gloves, sterile drape, hand hygiene and skin antiseptic. A timeout was performed prior to the initiation of the procedure. The right common femoral vein was interrogated with ultrasound. An image was obtained and stored for the medical record. Local anesthesia was attained by infiltration with 1% lidocaine. A small dermatotomy was made. Under real-time sonographic guidance, the vessel was punctured with a 21 gauge micropuncture needle. Using standard technique, the initial micro needle was exchanged over a 0.018 micro wire for a transitional 4 [place] micro sheath. The micro sheath was then exchanged over a 0.035 wire for a working 6 [place] vascular sheath. Using the same technique, a second ultrasound-guided puncture of the right common femoral vein was made slightly medial to the first. The micro needle was then exchanged over the same steps for a second working 6 [place] vascular sheath. There are now 2 separate sheaths in the right common femoral vein. Starting with more lateral sheath, a C2 cobra catheter was advanced over a a Bentson wire in used to navigate through the heart and  into the left main pulmonary artery. A left main pulmonary arteriogram was performed. Multiple filling defects are identified consistent with multifocal PG. The catheter was then navigated into the left lower lobe pulmonary artery. Repeat arteriography was performed confirming the anatomic position. A Rosen wire was advanced into the left lower lobe pulmonary artery through the catheter. Attention was turned to the more medial 6 French sheath. Again, a C2 cobra catheter was advanced over a Bentson wire into the heart and navigated through the heart and into the right main pulmonary artery. A right main pulmonary arteriogram was performed. There is significant outflow obstruction with a large clot at the bifurcation into the upper and lower lobe pulmonary arteries. Pressures were measured. The pulmonary arterial pressure was 53/29 for a mean of 30 mm Hg. The catheter was advanced into the right lower lobe pulmonary artery. Contrast injection was performed under fluoroscopy to confirm the anatomic position. A Rosen wire was advanced. Two EKOS 12 cm infusion length ultrasound assisted infusion catheters were then selected in advanced over the wires and positioned in the right and left lower lobe pulmonary arteries. Bilateral pulmonary arterial I cysts was then initiated at a rate of 0.5 milligrams/hour per catheter for a total rate of 1.0 milligrams/hour. The catheters were secured in place. The patient tolerated the procedure well. FINDINGS: 1. Multifocal bilateral pulmonary emboli. 2. Mean main pulmonary artery pressure 30 mm Hg consistent with pulmonary arterial hypertension. IMPRESSION: Successful initiation of bilateral pulmonary artery ultrasound accelerated catheter directed thrombolysis. Signed, Criselda Peaches, MD [facility] Electronically Signed   By: Jacqulynn Cadet M.D.   On: 02/07/2016 14:50   Ir Angiogram Selective Each Additional  Vessel  Result Date: 02/07/2016 INDICATION: 75 year old female with sub massive pulmonary embolus and acute cor pulmonale. EXAM: IR INFUSION THROMBOL ARTERIAL INITIAL (MS); ADDITIONAL ARTERIOGRAPHY; IR ULTRASOUND GUIDANCE VASC ACCESS RIGHT; BILATERAL PULMONARY ARTERIOGRAPHY COMPARISON:  Chest CTA 02/06/2016 MEDICATIONS: None. ANESTHESIA/SEDATION: Versed 0.5 mg IV; Fentanyl 50 mcg IV Moderate Sedation Time:  53 minutes The patient was continuously monitored during the procedure by the interventional radiology nurse  under my direct supervision. FLUOROSCOPY TIME:  Fluoroscopy Time: 6 minutes 18 seconds (66 mGy). COMPLICATIONS: None immediate. TECHNIQUE: Informed written consent was obtained from the patient after a thorough discussion of the procedural risks, benefits and alternatives. All questions were addressed. Maximal Sterile Barrier Technique was utilized including caps, mask, sterile gowns, sterile gloves, sterile drape, hand hygiene and skin antiseptic. A timeout was performed prior to the initiation of the procedure. The right common femoral vein was interrogated with ultrasound. An image was obtained and stored for the medical record. Local anesthesia was attained by infiltration with 1% lidocaine. A small dermatotomy was made. Under real-time sonographic guidance, the vessel was punctured with a 21 gauge micropuncture needle. Using standard technique, the initial micro needle was exchanged over a 0.018 micro wire for a transitional 4 [place] micro sheath. The micro sheath was then exchanged over a 0.035 wire for a working 6 [place] vascular sheath. Using the same technique, a second ultrasound-guided puncture of the right common femoral vein was made slightly medial to the first. The micro needle was then exchanged over the same steps for a second working 6 [place] vascular sheath. There are now 2 separate sheaths in the right common femoral vein. Starting with more lateral sheath, a C2 cobra catheter was  advanced over a a Bentson wire in used to navigate through the heart and into the left main pulmonary artery. A left main pulmonary arteriogram was performed. Multiple filling defects are identified consistent with multifocal PG. The catheter was then navigated into the left lower lobe pulmonary artery. Repeat arteriography was performed confirming the anatomic position. A Rosen wire was advanced into the left lower lobe pulmonary artery through the catheter. Attention was turned to the more medial 6 French sheath. Again, a C2 cobra catheter was advanced over a Bentson wire into the heart and navigated through the heart and into the right main pulmonary artery. A right main pulmonary arteriogram was performed. There is significant outflow obstruction with a large clot at the bifurcation into the upper and lower lobe pulmonary arteries. Pressures were measured. The pulmonary arterial pressure was 53/29 for a mean of 30 mm Hg. The catheter was advanced into the right lower lobe pulmonary artery. Contrast injection was performed under fluoroscopy to confirm the anatomic position. A Rosen wire was advanced. Two EKOS 12 cm infusion length ultrasound assisted infusion catheters were then selected in advanced over the wires and positioned in the right and left lower lobe pulmonary arteries. Bilateral pulmonary arterial I cysts was then initiated at a rate of 0.5 milligrams/hour per catheter for a total rate of 1.0 milligrams/hour. The catheters were secured in place. The patient tolerated the procedure well. FINDINGS: 1. Multifocal bilateral pulmonary emboli. 2. Mean main pulmonary artery pressure 30 mm Hg consistent with pulmonary arterial hypertension. IMPRESSION: Successful initiation of bilateral pulmonary artery ultrasound accelerated catheter directed thrombolysis. Signed, Criselda Peaches, MD [facility] Electronically Signed   By: Jacqulynn Cadet M.D.    On: 02/07/2016 14:50   Ir Angiogram Selective Each Additional Vessel  Result Date: 02/07/2016 INDICATION: 75 year old female with sub massive pulmonary embolus and acute cor pulmonale. EXAM: IR INFUSION THROMBOL ARTERIAL INITIAL (MS); ADDITIONAL ARTERIOGRAPHY; IR ULTRASOUND GUIDANCE VASC ACCESS RIGHT; BILATERAL PULMONARY ARTERIOGRAPHY COMPARISON:  Chest CTA 02/06/2016 MEDICATIONS: None. ANESTHESIA/SEDATION: Versed 0.5 mg IV; Fentanyl 50 mcg IV Moderate Sedation Time:  53 minutes The patient was continuously monitored during the procedure by the interventional radiology nurse under my direct  supervision. FLUOROSCOPY TIME:  Fluoroscopy Time: 6 minutes 18 seconds (66 mGy). COMPLICATIONS: None immediate. TECHNIQUE: Informed written consent was obtained from the patient after a thorough discussion of the procedural risks, benefits and alternatives. All questions were addressed. Maximal Sterile Barrier Technique was utilized including caps, mask, sterile gowns, sterile gloves, sterile drape, hand hygiene and skin antiseptic. A timeout was performed prior to the initiation of the procedure. The right common femoral vein was interrogated with ultrasound. An image was obtained and stored for the medical record. Local anesthesia was attained by infiltration with 1% lidocaine. A small dermatotomy was made. Under real-time sonographic guidance, the vessel was punctured with a 21 gauge micropuncture needle. Using standard technique, the initial micro needle was exchanged over a 0.018 micro wire for a transitional 4 [place] micro sheath. The micro sheath was then exchanged over a 0.035 wire for a working 6 [place] vascular sheath. Using the same technique, a second ultrasound-guided puncture of the right common femoral vein was made slightly medial to the first. The micro needle was then exchanged over the same steps for a second working 6 [place] vascular sheath. There are now 2 separate sheaths in the right common femoral  vein. Starting with more lateral sheath, a C2 cobra catheter was advanced over a a Bentson wire in used to navigate through the heart and into the left main pulmonary artery. A left main pulmonary arteriogram was performed. Multiple filling defects are identified consistent with multifocal PG. The catheter was then navigated into the left lower lobe pulmonary artery. Repeat arteriography was performed confirming the anatomic position. A Rosen wire was advanced into the left lower lobe pulmonary artery through the catheter. Attention was turned to the more medial 6 French sheath. Again, a C2 cobra catheter was advanced over a Bentson wire into the heart and navigated through the heart and into the right main pulmonary artery. A right main pulmonary arteriogram was performed. There is significant outflow obstruction with a large clot at the bifurcation into the upper and lower lobe pulmonary arteries. Pressures were measured. The pulmonary arterial pressure was 53/29 for a mean of 30 mm Hg. The catheter was advanced into the right lower lobe pulmonary artery. Contrast injection was performed under fluoroscopy to confirm the anatomic position. A Rosen wire was advanced. Two EKOS 12 cm infusion length ultrasound assisted infusion catheters were then selected in advanced over the wires and positioned in the right and left lower lobe pulmonary arteries. Bilateral pulmonary arterial I cysts was then initiated at a rate of 0.5 milligrams/hour per catheter for a total rate of 1.0 milligrams/hour. The catheters were secured in place. The patient tolerated the procedure well. FINDINGS: 1. Multifocal bilateral pulmonary emboli. 2. Mean main pulmonary artery pressure 30 mm Hg consistent with pulmonary arterial hypertension. IMPRESSION: Successful initiation of bilateral pulmonary artery ultrasound accelerated catheter directed thrombolysis. Signed, Criselda Peaches, MD [facility]  [facility] Radiology Electronically Signed   By: Jacqulynn Cadet M.D.   On: 02/07/2016 14:50   Nm Pulmonary Perf And Vent  Result Date: 02/04/2016 CLINICAL DATA:  Pulmonary hypertension EXAM: NUCLEAR MEDICINE VENTILATION - PERFUSION LUNG SCAN TECHNIQUE: Ventilation images were obtained in multiple projections using inhaled aerosol [id number]. Perfusion images were obtained in multiple projections after intravenous injection of Tc-921mAA. RADIOPHARMACEUTICALS:  30.3 mCi Technetium-9939mPA aerosol inhalation and 4.10 mCi Technetium-59m73m IV COMPARISON:  Chest x-ray 02/03/2016 FINDINGS: Ventilation: No focal ventilation defect. Perfusion: There are at least 2 moderate wedge-shaped  peripheral defects within the superior and posterior upper right lung. Additional small wedge-shaped perfusion defect posterior left upper lobe. There is cardiomegaly. IMPRESSION: Overall intermediate probability V/Q scan. Electronically Signed   By: Donavan Foil M.D.   On: 02/04/2016 21:31   Ir US Guide Vasc Access Right  Result Date: 02/07/2016 INDICATION: 76 year old female with sub massive pulmonary embolus and acute cor pulmonale. EXAM: IR INFUSION THROMBOL ARTERIAL INITIAL (MS); ADDITIONAL ARTERIOGRAPHY; IR ULTRASOUND GUIDANCE VASC ACCESS RIGHT; BILATERAL PULMONARY ARTERIOGRAPHY COMPARISON:  Chest CTA 02/06/2016 MEDICATIONS: None. ANESTHESIA/SEDATION: Versed 0.5 mg IV; Fentanyl 50 mcg IV Moderate Sedation Time:  53 minutes The patient was continuously monitored during the procedure by the interventional radiology nurse under my direct supervision. FLUOROSCOPY TIME:  Fluoroscopy Time: 6 minutes 18 seconds (66 mGy). COMPLICATIONS: None immediate. TECHNIQUE: Informed written consent was obtained from the patient after a thorough discussion of the procedural risks, benefits and alternatives. All questions were addressed. Maximal Sterile Barrier Technique was utilized including caps, mask, sterile gowns, sterile gloves,  sterile drape, hand hygiene and skin antiseptic. A timeout was performed prior to the initiation of the procedure. The right common femoral vein was interrogated with ultrasound. An image was obtained and stored for the medical record. Local anesthesia was attained by infiltration with 1% lidocaine. A small dermatotomy was made. Under real-time sonographic guidance, the vessel was punctured with a 21 gauge micropuncture needle. Using standard technique, the initial micro needle was exchanged over a 0.018 micro wire for a transitional 4 [place] micro sheath. The micro sheath was then exchanged over a 0.035 wire for a working 6 [place] vascular sheath. Using the same technique, a second ultrasound-guided puncture of the right common femoral vein was made slightly medial to the first. The micro needle was then exchanged over the same steps for a second working 6 [place] vascular sheath. There are now 2 separate sheaths in the right common femoral vein. Starting with more lateral sheath, a C2 cobra catheter was advanced over a a Bentson wire in used to navigate through the heart and into the left main pulmonary artery. A left main pulmonary arteriogram was performed. Multiple filling defects are identified consistent with multifocal PG. The catheter was then navigated into the left lower lobe pulmonary artery. Repeat arteriography was performed confirming the anatomic position. A Rosen wire was advanced into the left lower lobe pulmonary artery through the catheter. Attention was turned to the more medial 6 French sheath. Again, a C2 cobra catheter was advanced over a Bentson wire into the heart and navigated through the heart and into the right main pulmonary artery. A right main pulmonary arteriogram was performed. There is significant outflow obstruction with a large clot at the bifurcation into the upper and lower lobe pulmonary arteries. Pressures were measured. The pulmonary arterial pressure was 53/29 for a mean of  30 mm Hg. The catheter was advanced into the right lower lobe pulmonary artery. Contrast injection was performed under fluoroscopy to confirm the anatomic position. A Rosen wire was advanced. Two EKOS 12 cm infusion length ultrasound assisted infusion catheters were then selected in advanced over the wires and positioned in the right and left lower lobe pulmonary arteries. Bilateral pulmonary arterial I cysts was then initiated at a rate of 0.5 milligrams/hour per catheter for a total rate of 1.0 milligrams/hour. The catheters were secured in place. The patient tolerated the procedure well. FINDINGS: 1. Multifocal bilateral pulmonary emboli. 2. Mean main pulmonary artery pressure 30 mm Hg consistent with pulmonary arterial  hypertension. IMPRESSION: Successful initiation of bilateral pulmonary artery ultrasound accelerated catheter directed thrombolysis. Signed, Criselda Peaches, MD [facility] Radiology Electronically Signed   By: Jacqulynn Cadet M.D.   On: 02/07/2016 14:50   Dg Chest Port 1 View  Result Date: 02/08/2016 CLINICAL DATA:  75 year old female with sub massive pulmonary embolus status post bilateral pulmonary artery ultrasound accelerated catheter directed thrombolysis yesterday. Initial encounter. EXAM: PORTABLE CHEST 1 VIEW COMPARISON:  Chest CTA 02/06/2016. Chest radiographs 02/03/2016 and earlier. FINDINGS: Portable AP supine view at 0434 hours. Stable cardiomegaly and mediastinal contours. Relatively stable lung volumes compared to 02/03/2016. Allowing for portable technique the lungs are clear. No pneumothorax or pleural effusion. Visualized tracheal air column is within normal limits. IMPRESSION: Stable cardiomegaly and no acute cardiopulmonary abnormality following pulmonary artery catheter directed thrombolysis. Electronically Signed   By: Genevie Ann M.D.   On: 02/08/2016 06:36   Ir Infusion Thrombol Arterial Initial (ms)  Result Date:  02/07/2016 INDICATION: 76 year old female with sub massive pulmonary embolus and acute cor pulmonale. EXAM: IR INFUSION THROMBOL ARTERIAL INITIAL (MS); ADDITIONAL ARTERIOGRAPHY; IR ULTRASOUND GUIDANCE VASC ACCESS RIGHT; BILATERAL PULMONARY ARTERIOGRAPHY COMPARISON:  Chest CTA 02/06/2016 MEDICATIONS: None. ANESTHESIA/SEDATION: Versed 0.5 mg IV; Fentanyl 50 mcg IV Moderate Sedation Time:  53 minutes The patient was continuously monitored during the procedure by the interventional radiology nurse under my direct supervision. FLUOROSCOPY TIME:  Fluoroscopy Time: 6 minutes 18 seconds (66 mGy). COMPLICATIONS: None immediate. TECHNIQUE: Informed written consent was obtained from the patient after a thorough discussion of the procedural risks, benefits and alternatives. All questions were addressed. Maximal Sterile Barrier Technique was utilized including caps, mask, sterile gowns, sterile gloves, sterile drape, hand hygiene and skin antiseptic. A timeout was performed prior to the initiation of the procedure. The right common femoral vein was interrogated with ultrasound. An image was obtained and stored for the medical record. Local anesthesia was attained by infiltration with 1% lidocaine. A small dermatotomy was made. Under real-time sonographic guidance, the vessel was punctured with a 21 gauge micropuncture needle. Using standard technique, the initial micro needle was exchanged over a 0.018 micro wire for a transitional 4 [place] micro sheath. The micro sheath was then exchanged over a 0.035 wire for a working 6 [place] vascular sheath. Using the same technique, a second ultrasound-guided puncture of the right common femoral vein was made slightly medial to the first. The micro needle was then exchanged over the same steps for a second working 6 [place] vascular sheath. There are now 2 separate sheaths in the right common femoral vein. Starting with more lateral sheath, a C2 cobra catheter was advanced over a a Bentson  wire in used to navigate through the heart and into the left main pulmonary artery. A left main pulmonary arteriogram was performed. Multiple filling defects are identified consistent with multifocal PG. The catheter was then navigated into the left lower lobe pulmonary artery. Repeat arteriography was performed confirming the anatomic position. A Rosen wire was advanced into the left lower lobe pulmonary artery through the catheter. Attention was turned to the more medial 6 French sheath. Again, a C2 cobra catheter was advanced over a Bentson wire into the heart and navigated through the heart and into the right main pulmonary artery. A right main pulmonary arteriogram was performed. There is significant outflow obstruction with a large clot at the bifurcation into the upper and lower lobe pulmonary arteries. Pressures were measured. The pulmonary arterial pressure was 53/29 for a  mean of 30 mm Hg. The catheter was advanced into the right lower lobe pulmonary artery. Contrast injection was performed under fluoroscopy to confirm the anatomic position. A Rosen wire was advanced. Two EKOS 12 cm infusion length ultrasound assisted infusion catheters were then selected in advanced over the wires and positioned in the right and left lower lobe pulmonary arteries. Bilateral pulmonary arterial I cysts was then initiated at a rate of 0.5 milligrams/hour per catheter for a total rate of 1.0 milligrams/hour. The catheters were secured in place. The patient tolerated the procedure well. FINDINGS: 1. Multifocal bilateral pulmonary emboli. 2. Mean main pulmonary artery pressure 30 mm Hg consistent with pulmonary arterial hypertension. IMPRESSION: Successful initiation of bilateral pulmonary artery ultrasound accelerated catheter directed thrombolysis. Signed, Criselda Peaches, MD [facility] Radiology Electronically Signed   By: Jacqulynn Cadet M.D.   On: 02/07/2016 14:50    Ir Infusion Thrombol Arterial Initial (ms)  Result Date: 02/07/2016 INDICATION: 75 year old female with sub massive pulmonary embolus and acute cor pulmonale. EXAM: IR INFUSION THROMBOL ARTERIAL INITIAL (MS); ADDITIONAL ARTERIOGRAPHY; IR ULTRASOUND GUIDANCE VASC ACCESS RIGHT; BILATERAL PULMONARY ARTERIOGRAPHY COMPARISON:  Chest CTA 02/06/2016 MEDICATIONS: None. ANESTHESIA/SEDATION: Versed 0.5 mg IV; Fentanyl 50 mcg IV Moderate Sedation Time:  53 minutes The patient was continuously monitored during the procedure by the interventional radiology nurse under my direct supervision. FLUOROSCOPY TIME:  Fluoroscopy Time: 6 minutes 18 seconds (66 mGy). COMPLICATIONS: None immediate. TECHNIQUE: Informed written consent was obtained from the patient after a thorough discussion of the procedural risks, benefits and alternatives. All questions were addressed. Maximal Sterile Barrier Technique was utilized including caps, mask, sterile gowns, sterile gloves, sterile drape, hand hygiene and skin antiseptic. A timeout was performed prior to the initiation of the procedure. The right common femoral vein was interrogated with ultrasound. An image was obtained and stored for the medical record. Local anesthesia was attained by infiltration with 1% lidocaine. A small dermatotomy was made. Under real-time sonographic guidance, the vessel was punctured with a 21 gauge micropuncture needle. Using standard technique, the initial micro needle was exchanged over a 0.018 micro wire for a transitional 4 [place] micro sheath. The micro sheath was then exchanged over a 0.035 wire for a working 6 [place] vascular sheath. Using the same technique, a second ultrasound-guided puncture of the right common femoral vein was made slightly medial to the first. The micro needle was then exchanged over the same steps for a second working 6 [place] vascular sheath. There are now 2 separate sheaths in the right common femoral vein. Starting with more  lateral sheath, a C2 cobra catheter was advanced over a a Bentson wire in used to navigate through the heart and into the left main pulmonary artery. A left main pulmonary arteriogram was performed. Multiple filling defects are identified consistent with multifocal PG. The catheter was then navigated into the left lower lobe pulmonary artery. Repeat arteriography was performed confirming the anatomic position. A Rosen wire was advanced into the left lower lobe pulmonary artery through the catheter. Attention was turned to the more medial 6 [place] sheath. Again, a C2 cobra catheter was advanced over a Bentson wire into the heart and navigated through the heart and into the right main pulmonary artery. A right main pulmonary arteriogram was performed. There is significant outflow obstruction with a large clot at the bifurcation into the upper and lower lobe pulmonary arteries. Pressures were measured. The pulmonary arterial pressure was 53/29 for a mean of 30  mm Hg. The catheter was advanced into the right lower lobe pulmonary artery. Contrast injection was performed under fluoroscopy to confirm the anatomic position. A Rosen wire was advanced. Two EKOS 12 cm infusion length ultrasound assisted infusion catheters were then selected in advanced over the wires and positioned in the right and left lower lobe pulmonary arteries. Bilateral pulmonary arterial I cysts was then initiated at a rate of 0.5 milligrams/hour per catheter for a total rate of 1.0 milligrams/hour. The catheters were secured in place. The patient tolerated the procedure well. FINDINGS: 1. Multifocal bilateral pulmonary emboli. 2. Mean main pulmonary artery pressure 30 mm Hg consistent with pulmonary arterial hypertension. IMPRESSION: Successful initiation of bilateral pulmonary artery ultrasound accelerated catheter directed thrombolysis. Signed, Criselda Peaches, MD [facility] Radiology  Electronically Signed   By: Jacqulynn Cadet M.D.   On: 02/07/2016 14:50     Assessment/Plan   ICD-9-CM ICD-10-CM   1. Physical deconditioning 799.3 R53.81 Ambulatory referral to Home Health  2. Acute saddle pulmonary embolism without acute cor pulmonale (HCC) 415.13 I26.92 Ambulatory referral to [place]     CBC with Differential/Platelets  3. 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 Essential hypertension 401.9 I10 amLODipine (NORVASC) 10 MG tablet  5. CAD in native artery 414.01 I25.10   6. Dyspnea on exertion 786.09 R06.09   7. Elevated troponin 790.6 R74.8   8. Iron deficiency anemia, unspecified iron deficiency anemia type 280.9 D50.9 CBC with Differential/Platelets    Continue xeralto x 6 mos (June 2018 stop date)  RF on amlodipine given  She prefers to hold off further w/u of anemia until SOB markedly improved. She will need GI eval  Right inguinal pressure pack removed without complication. No bleeding noted  Follow up with specialists as scheduled  Resume activity as tolerated  Will get home health PT  Needs shower chair  Use walker when out and about  Follow up in 1 month for pulmonary embolism  Talyah Seder S. Perlie Gold  [facility] [address] [place], [place] [address] [phone] Cell (Monday-Friday 8 AM - 5 PM) [phone] After 5 PM and follow prompts

## 2016-02-14 LAB — CBC WITH DIFFERENTIAL/PLATELET
BASOS ABS: 0 {cells}/uL (ref 0–200)
Basophils Relative: 0 %
EOS PCT: 3 %
Eosinophils Absolute: 144 cells/uL (ref 15–500)
HCT: 27.5 % — ABNORMAL LOW (ref 35.0–45.0)
HEMOGLOBIN: 8.4 g/dL — AB (ref 11.7–15.5)
LYMPHS ABS: 1008 {cells}/uL (ref 850–3900)
LYMPHS PCT: 21 %
MCH: 23.1 pg — AB (ref 27.0–33.0)
MCHC: 30.5 g/dL — AB (ref 32.0–36.0)
MCV: 75.8 fL — ABNORMAL LOW (ref 80.0–100.0)
MPV: 9.2 fL (ref 7.5–12.5)
Monocytes Absolute: 432 cells/uL (ref 200–950)
Monocytes Relative: 9 %
NEUTROS PCT: 67 %
Neutro Abs: 3216 cells/uL (ref 1500–7800)
Platelets: 414 10*3/uL — ABNORMAL HIGH (ref 140–400)
RBC: 3.63 MIL/uL — ABNORMAL LOW (ref 3.80–5.10)
RDW: 21.9 % — ABNORMAL HIGH (ref 11.0–15.0)
WBC: 4.8 10*3/uL (ref 3.8–10.8)

## 2016-02-17 ENCOUNTER — Ambulatory Visit (HOSPITAL_COMMUNITY)
Admit: 2016-02-17 | Discharge: 2016-02-17 | Disposition: A | Discharge status: Home / Self Care | Payer: PPO | Attending: Cardiology | Admitting: Cardiology

## 2016-02-17 DIAGNOSIS — J439 Emphysema, unspecified: Secondary | ICD-10-CM | POA: Insufficient documentation

## 2016-02-17 DIAGNOSIS — R0602 Shortness of breath: Secondary | ICD-10-CM | POA: Insufficient documentation

## 2016-02-17 LAB — PULMONARY FUNCTION TEST
DL/VA % PRED: 95 %
DL/VA: 4.45 ml/min/mmHg/L
DLCO COR: 17.57 ml/min/mmHg
DLCO UNC % PRED: 61 %
DLCO UNC: 14.11 ml/min/mmHg
DLCO cor % pred: 76 %
FEF 25-75 PRE: 1.23 L/s
FEF 25-75 Post: 1.73 L/sec
FEF2575-%CHANGE-POST: 41 %
FEF2575-%PRED-POST: 117 %
FEF2575-%PRED-PRE: 83 %
FEV1-%Change-Post: 17 %
FEV1-%PRED-POST: 115 %
FEV1-%PRED-PRE: 97 %
FEV1-PRE: 1.61 L
FEV1-Post: 1.89 L
FEV1FVC-%CHANGE-POST: 22 %
FEV1FVC-%PRED-PRE: 84 %
FEV6-%CHANGE-POST: -3 %
FEV6-%PRED-POST: 117 %
FEV6-%Pred-Pre: 121 %
FEV6-PRE: 2.46 L
FEV6-Post: 2.38 L
FEV6FVC-%CHANGE-POST: 0 %
FEV6FVC-%Pred-Post: 104 %
FEV6FVC-%Pred-Pre: 103 %
FVC-%Change-Post: -3 %
FVC-%Pred-Post: 112 %
FVC-%Pred-Pre: 116 %
FVC-Post: 2.38 L
FVC-Pre: 2.47 L
POST FEV1/FVC RATIO: 79 %
PRE FEV1/FVC RATIO: 65 %
Post FEV6/FVC ratio: 100 %
Pre FEV6/FVC Ratio: 99 %
RV % pred: 227 %
RV: 5.05 L
TLC % pred: 153 %
TLC: 7.51 L

## 2016-02-17 MED ORDER — ALBUTEROL SULFATE (2.5 MG/3ML) 0.083% IN NEBU
2.5000 mg | INHALATION_SOLUTION | Freq: Once | RESPIRATORY_TRACT | Status: AC
  Administered 2016-02-17: 2.5 mg via RESPIRATORY_TRACT

## 2016-02-23 ENCOUNTER — Telehealth: Payer: Self-pay | Admitting: *Deleted

## 2016-02-23 NOTE — Telephone Encounter (Signed)
Kesa with [facility] called and stated that they are starting [place] PT tomorrow 02/24/16.

## 2016-02-24 DIAGNOSIS — I2510 Atherosclerotic heart disease of native coronary artery without angina pectoris: Secondary | ICD-10-CM | POA: Diagnosis not present

## 2016-02-24 DIAGNOSIS — I2729 Other secondary pulmonary hypertension: Secondary | ICD-10-CM | POA: Diagnosis not present

## 2016-02-24 DIAGNOSIS — I2692 Saddle embolus of pulmonary artery without acute cor pulmonale: Secondary | ICD-10-CM | POA: Diagnosis not present

## 2016-02-24 DIAGNOSIS — E785 Hyperlipidemia, unspecified: Secondary | ICD-10-CM | POA: Diagnosis not present

## 2016-02-24 DIAGNOSIS — Z7901 Long term (current) use of anticoagulants: Secondary | ICD-10-CM | POA: Diagnosis not present

## 2016-02-24 DIAGNOSIS — D509 Iron deficiency anemia, unspecified: Secondary | ICD-10-CM | POA: Diagnosis not present

## 2016-02-24 DIAGNOSIS — I10 Essential (primary) hypertension: Secondary | ICD-10-CM | POA: Diagnosis not present

## 2016-02-24 DIAGNOSIS — Z8551 Personal history of malignant neoplasm of bladder: Secondary | ICD-10-CM | POA: Diagnosis not present

## 2016-02-25 ENCOUNTER — Telehealth: Payer: Self-pay | Admitting: *Deleted

## 2016-02-25 NOTE — Telephone Encounter (Signed)
Flora with [facility] called and requested verbal orders for PT 2x2wks and 1x1wk. Order given.

## 2016-03-02 ENCOUNTER — Ambulatory Visit (INDEPENDENT_AMBULATORY_CARE_PROVIDER_SITE_OTHER): Payer: PPO | Admitting: Cardiology

## 2016-03-02 ENCOUNTER — Encounter: Payer: Self-pay | Admitting: Cardiology

## 2016-03-02 DIAGNOSIS — E785 Hyperlipidemia, unspecified: Secondary | ICD-10-CM

## 2016-03-02 DIAGNOSIS — I10 Essential (primary) hypertension: Secondary | ICD-10-CM | POA: Diagnosis not present

## 2016-03-02 DIAGNOSIS — Z7901 Long term (current) use of anticoagulants: Secondary | ICD-10-CM

## 2016-03-02 DIAGNOSIS — I2510 Atherosclerotic heart disease of native coronary artery without angina pectoris: Secondary | ICD-10-CM | POA: Diagnosis not present

## 2016-03-02 DIAGNOSIS — D500 Iron deficiency anemia secondary to blood loss (chronic): Secondary | ICD-10-CM

## 2016-03-02 DIAGNOSIS — I2602 Saddle embolus of pulmonary artery with acute cor pulmonale: Secondary | ICD-10-CM | POA: Diagnosis not present

## 2016-03-02 HISTORY — DX: Long term (current) use of anticoagulants: Z79.01

## 2016-03-02 HISTORY — DX: Hyperlipidemia, unspecified: E78.5

## 2016-03-02 NOTE — Patient Instructions (Addendum)
Kerin Ransom, PA-C, recommends that you schedule a follow-up appointment in 3 months with Dr Claiborne Billings.  If you need a refill on your cardiac medications before your next appointment, please call your pharmacy.

## 2016-03-02 NOTE — Assessment & Plan Note (Signed)
Controlled.  

## 2016-03-02 NOTE — Assessment & Plan Note (Signed)
On Xarelto 20 mg 

## 2016-03-02 NOTE — Assessment & Plan Note (Signed)
Acute saddle PE treated with direct thrombolytics 02/07/16

## 2016-03-02 NOTE — Progress Notes (Signed)
03/02/2016 Catherine Snyder   1940/09/28  [id number]  Primary Physician Gildardo Cranker, DO Primary Cardiologist: Dr Claiborne Billings  HPI:  Pleasant 75 y/o AA female with a history of bladder cancer, s/p surgery and radiation April 2017, admitted 02/04/16 with dyspnea. She had an elevated Troponin. Cath done showed 40% LAD. Echo suggested RV strain and pulmonary HTN. VQ was positive for PE and pulmonary service was consulted. On 02/07/16 she underwent bilateral pulmonary artery [place] accelerated catheter directed thrombolysis by interventional radiology. She did have post intervention bleeding around the sheath which required transfusion but ultimately stabilized and was discharged 02/11/16.  She was discharged on treatment dose Xarelto, she will decrease the dose to 20 mg daily this week. She has done well since discharge, a little weak according to the pt's daughter, using a walker at home.    Current Outpatient Prescriptions  Medication Sig Dispense Refill  . amLODipine (NORVASC) 10 MG tablet Take 1 tablet (10 mg total) by mouth daily. 90 tablet 1  . aspirin 81 MG chewable tablet Chew 81 mg by mouth daily.    Marland Kitchen atorvastatin (LIPITOR) 80 MG tablet Take 1 tablet (80 mg total) by mouth daily at 6 PM. 30 tablet 0  . nitroGLYCERIN (NITROSTAT) 0.4 MG SL tablet Place 1 tablet (0.4 mg total) under the tongue every 5 (five) minutes x 3 doses as needed for chest pain. 30 tablet 0  . Rivaroxaban (XARELTO) 15 MG TABS tablet Take 1 tablet (15 mg total) by mouth 2 (two) times daily with a meal. 42 tablet 0  . rivaroxaban (XARELTO) 20 MG TABS tablet Take 1 tablet (20 mg total) by mouth daily with supper. 30 tablet 0   No current facility-administered medications for this visit.     No Known Allergies  Social History   Social History  . Marital status: Single    Spouse name: N/A  . Number of children: N/A  . Years of education: N/A   Occupational History  . Retired Automotive engineer    Social History Main  Topics  . Smoking status: Never Smoker  . Smokeless tobacco: Never Used  . Alcohol use No  . Drug use: No  . Sexual activity: No   Other Topics Concern  . Not on file   Social History Narrative    Diet:   Do you drink/eat things with caffeine? Yes coffee   Marital status: Divorced                              What year were you married?    Do you live in a house, apartment, assisted living, condo, trailer, etc)? House   Is it one or more stories? 1 story   How many persons live in your home? 4   Do you have any pets in your home? 0   Current or past profession: Dietitian   Do you exercise? Yes                                       Type & how often:  Walk daily   Do you have a living will? No   Do you have a DNR Form? No   Do you have a POA/HPOA forms? No     Review of Systems: General: negative for chills, fever, night sweats or weight changes.  Cardiovascular: negative  for chest pain, dyspnea on exertion, edema, orthopnea, palpitations, paroxysmal nocturnal dyspnea or shortness of breath Dermatological: negative for rash Respiratory: negative for cough or wheezing Urologic: negative for hematuria Abdominal: negative for nausea, vomiting, diarrhea, bright red blood per rectum, melena, or hematemesis Neurologic: negative for visual changes, syncope, or dizziness All other systems reviewed and are otherwise negative except as noted above.    Blood pressure 132/79, pulse 86, height 5\' 3"  (1.6 m), weight 166 lb 3.2 oz (75.4 kg), SpO2 98 %.  General appearance: alert, cooperative and no distress Neck: no carotid bruit and no JVD Lungs: clear to auscultation bilaterally Heart: regular rate and rhythm Extremities: extremities normal, atraumatic, no cyanosis or edema Pulses: 2+ and symmetric Skin: Skin color, texture, turgor normal. No rashes or lesions Neurologic: Grossly normal   ASSESSMENT AND PLAN:   Acute saddle pulmonary embolism with acute cor pulmonale (HCC) Acute  saddle PE treated with direct thrombolytics 02/07/16  CAD in native artery 40% LAD at cath 02/04/16  Essential hypertension Controlled  Dyslipidemia On statin rx  Absolute anemia Being followed by PCP  Chronic anticoagulation On Xarelto 20 mg   PLAN  Continue Xarelto 20 mg daily for at least 6 months, possibly a year. After a year we could consider Xarelto 10 mg daily based on the [place] Study. She will f/u with Dr Claiborne Billings in March.   Kerin Ransom PA-C 03/02/2016 11:10 AM

## 2016-03-02 NOTE — Assessment & Plan Note (Signed)
On statin rx 

## 2016-03-02 NOTE — Assessment & Plan Note (Signed)
Being followed by PCP

## 2016-03-02 NOTE — Assessment & Plan Note (Signed)
40% LAD at cath 02/04/16

## 2016-03-03 DIAGNOSIS — I2692 Saddle embolus of pulmonary artery without acute cor pulmonale: Secondary | ICD-10-CM | POA: Diagnosis not present

## 2016-03-03 DIAGNOSIS — E785 Hyperlipidemia, unspecified: Secondary | ICD-10-CM | POA: Diagnosis not present

## 2016-03-03 DIAGNOSIS — D509 Iron deficiency anemia, unspecified: Secondary | ICD-10-CM | POA: Diagnosis not present

## 2016-03-03 DIAGNOSIS — Z7901 Long term (current) use of anticoagulants: Secondary | ICD-10-CM | POA: Diagnosis not present

## 2016-03-03 DIAGNOSIS — I10 Essential (primary) hypertension: Secondary | ICD-10-CM | POA: Diagnosis not present

## 2016-03-03 DIAGNOSIS — I2729 Other secondary pulmonary hypertension: Secondary | ICD-10-CM | POA: Diagnosis not present

## 2016-03-03 DIAGNOSIS — Z8551 Personal history of malignant neoplasm of bladder: Secondary | ICD-10-CM | POA: Diagnosis not present

## 2016-03-03 DIAGNOSIS — I2510 Atherosclerotic heart disease of native coronary artery without angina pectoris: Secondary | ICD-10-CM | POA: Diagnosis not present

## 2016-03-10 DIAGNOSIS — I2729 Other secondary pulmonary hypertension: Secondary | ICD-10-CM | POA: Diagnosis not present

## 2016-03-10 DIAGNOSIS — I2692 Saddle embolus of pulmonary artery without acute cor pulmonale: Secondary | ICD-10-CM | POA: Diagnosis not present

## 2016-03-10 DIAGNOSIS — E785 Hyperlipidemia, unspecified: Secondary | ICD-10-CM | POA: Diagnosis not present

## 2016-03-10 DIAGNOSIS — Z8551 Personal history of malignant neoplasm of bladder: Secondary | ICD-10-CM | POA: Diagnosis not present

## 2016-03-10 DIAGNOSIS — D509 Iron deficiency anemia, unspecified: Secondary | ICD-10-CM | POA: Diagnosis not present

## 2016-03-10 DIAGNOSIS — I10 Essential (primary) hypertension: Secondary | ICD-10-CM | POA: Diagnosis not present

## 2016-03-10 DIAGNOSIS — I2510 Atherosclerotic heart disease of native coronary artery without angina pectoris: Secondary | ICD-10-CM | POA: Diagnosis not present

## 2016-03-10 DIAGNOSIS — Z7901 Long term (current) use of anticoagulants: Secondary | ICD-10-CM | POA: Diagnosis not present

## 2016-03-17 ENCOUNTER — Encounter: Payer: Self-pay | Admitting: Internal Medicine

## 2016-03-17 ENCOUNTER — Ambulatory Visit (INDEPENDENT_AMBULATORY_CARE_PROVIDER_SITE_OTHER): Payer: PPO | Admitting: Internal Medicine

## 2016-03-17 VITALS — BP 130/78 | HR 76 | Temp 98.0°F | Ht 63.0 in | Wt 168.8 lb

## 2016-03-17 DIAGNOSIS — I10 Essential (primary) hypertension: Secondary | ICD-10-CM | POA: Diagnosis not present

## 2016-03-17 DIAGNOSIS — D509 Iron deficiency anemia, unspecified: Secondary | ICD-10-CM | POA: Diagnosis not present

## 2016-03-17 DIAGNOSIS — I2699 Other pulmonary embolism without acute cor pulmonale: Secondary | ICD-10-CM | POA: Diagnosis not present

## 2016-03-17 DIAGNOSIS — I2692 Saddle embolus of pulmonary artery without acute cor pulmonale: Secondary | ICD-10-CM

## 2016-03-17 DIAGNOSIS — I2729 Other secondary pulmonary hypertension: Secondary | ICD-10-CM

## 2016-03-17 DIAGNOSIS — R5381 Other malaise: Secondary | ICD-10-CM

## 2016-03-17 LAB — CBC WITH DIFFERENTIAL/PLATELET
BASOS PCT: 1 %
Basophils Absolute: 42 cells/uL (ref 0–200)
Eosinophils Absolute: 168 cells/uL (ref 15–500)
Eosinophils Relative: 4 %
HEMATOCRIT: 28.1 % — AB (ref 35.0–45.0)
Hemoglobin: 8.3 g/dL — ABNORMAL LOW (ref 11.7–15.5)
LYMPHS PCT: 30 %
Lymphs Abs: 1260 cells/uL (ref 850–3900)
MCH: 22.2 pg — ABNORMAL LOW (ref 27.0–33.0)
MCHC: 29.5 g/dL — ABNORMAL LOW (ref 32.0–36.0)
MCV: 75.1 fL — ABNORMAL LOW (ref 80.0–100.0)
MONO ABS: 462 {cells}/uL (ref 200–950)
MONOS PCT: 11 %
MPV: 9.1 fL (ref 7.5–12.5)
Neutro Abs: 2268 cells/uL (ref 1500–7800)
Neutrophils Relative %: 54 %
PLATELETS: 391 10*3/uL (ref 140–400)
RBC: 3.74 MIL/uL — AB (ref 3.80–5.10)
RDW: 19.7 % — AB (ref 11.0–15.0)
WBC: 4.2 10*3/uL (ref 3.8–10.8)

## 2016-03-17 LAB — COMPLETE METABOLIC PANEL WITH GFR
ALT: 17 U/L (ref 6–29)
AST: 23 U/L (ref 10–35)
Albumin: 3.6 g/dL (ref 3.6–5.1)
Alkaline Phosphatase: 136 U/L — ABNORMAL HIGH (ref 33–130)
BUN: 18 mg/dL (ref 7–25)
CHLORIDE: 105 mmol/L (ref 98–110)
CO2: 25 mmol/L (ref 20–31)
Calcium: 9 mg/dL (ref 8.6–10.4)
Creat: 0.75 mg/dL (ref 0.60–0.93)
GFR, Est African American: >89 mL/min (ref >=60)
GFR, Est Non African American: 79 mL/min (ref >=60)
Glucose, Bld: 113 mg/dL — ABNORMAL HIGH (ref 65–99)
POTASSIUM: 4 mmol/L (ref 3.5–5.3)
Sodium: 138 mmol/L (ref 135–146)
Total Bilirubin: 0.4 mg/dL (ref 0.2–1.2)
Total Protein: 7.2 g/dL (ref 6.1–8.1)

## 2016-03-17 MED ORDER — RIVAROXABAN 20 MG PO TABS: 20.0000 mg | ORAL_TABLET | Freq: Every day | ORAL | 6 refills | Status: DC

## 2016-03-17 MED ORDER — ATORVASTATIN CALCIUM 80 MG PO TABS: 80.0000 mg | ORAL_TABLET | Freq: Every day | ORAL | 1 refills | Status: DC

## 2016-03-17 MED ORDER — AMLODIPINE BESYLATE 10 MG PO TABS: 10.0000 mg | ORAL_TABLET | Freq: Every day | ORAL | 1 refills | Status: DC

## 2016-03-17 NOTE — Patient Instructions (Addendum)
May resume Vitamin C daily  Continue other medications as ordered  Will call with lab results  Follow up with specialists as scheduled  Follow up in 2 mos for routine visit.

## 2016-03-17 NOTE — Progress Notes (Signed)
Patient ID: Catherine Snyder, female   DOB: 05-08-1940, 76 y.o.   MRN: [id number]    Location:  [facility] Place of Service: OFFICE  Chief Complaint  Patient presents with  . Medical Management of Chronic Issues    1 month follow up    HPI:  76 yo female seen today for f/u PE. She is taking xeralto until June 2018. She reports fatigue and SOB with heavy exertion but improving since last month. She completed HH PT and was released from care. She rarely uses cane/walker. She does home exercises BID to strengthen RLE which is helping. No easy bruising or bleeding. No bloody stools. Appetite is excellent. No CP but has occasional palpitations and a "twinge" in left forearm that goes away when pressure applied.   HTN - BP controlled on amlodipine. She takes ASA daily  Bladder cancer (dx March 2017) - s/p resection. Prior to dx, She had intermittent hematuria (passing blood clots) at end of urine stream x 1 yr. No dysuria but had increased frequency. Non smoker. Followed by urology  She relocated from [place] in March 2015  CAD - nonobstructive. Cath done 02/04/16. She takes lipitor for hyperlipidemia. She has prn SLNTG. Followed by cardio Dr Claiborne Billings  Anemia - Hgb 8.4. She is s/p 2 units PRBCs. 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 On xeralto until June 2018  Past Medical History:  Diagnosis Date  . Bladder cancer ([place])    a. s/p resection in 06/2015  . Dysuria   . Frequency of urination   . Hematuria   . Hypertension   . Nocturia   . Wears glasses   . Wears partial dentures    upper    Past Surgical History:  Procedure Laterality Date  . CARDIAC CATHETERIZATION N/A 02/04/2016   Procedure: RIGHT/LEFT HEART CATH AND CORONARY ANGIOGRAPHY;  Surgeon: Troy Sine, MD;  Location: [facility];  Service: Cardiovascular;  Laterality: N/A;  . CYSTOSCOPY W/ RETROGRADES Bilateral 07/07/2015   Procedure: CYSTOSCOPY WITH RETROGRADE PYELOGRAM;  Surgeon: Cleon Gustin, MD;  Location: [facility];  Service: Urology;  Laterality: Bilateral;  . IR GENERIC HISTORICAL  02/07/2016   IR ANGIOGRAM SELECTIVE EACH ADDITIONAL VESSEL 02/07/2016 Jacqulynn Cadet, MD [facility]  . IR GENERIC HISTORICAL  02/07/2016   IR US GUIDE VASC ACCESS RIGHT 02/07/2016 Jacqulynn Cadet, MD [facility]  . IR GENERIC HISTORICAL  02/07/2016   IR ANGIOGRAM PULMONARY BILATERAL SELECTIVE 02/07/2016 Jacqulynn Cadet, MD [facility]  . IR GENERIC HISTORICAL  02/07/2016   IR INFUSION THROMBOL ARTERIAL INITIAL (MS) 02/07/2016 Jacqulynn Cadet, MD [facility]  . IR GENERIC HISTORICAL  02/07/2016   IR INFUSION THROMBOL ARTERIAL INITIAL (MS) 02/07/2016 Jacqulynn Cadet, MD [facility]  . IR GENERIC HISTORICAL  02/07/2016   IR ANGIOGRAM SELECTIVE EACH ADDITIONAL VESSEL 02/07/2016 Jacqulynn Cadet, MD [facility]  . NO PAST SURGERIES    . TRANSURETHRAL RESECTION OF BLADDER TUMOR WITH GYRUS (TURBT-GYRUS) N/A 07/07/2015   Procedure: TRANSURETHRAL RESECTION OF BLADDER TUMOR WITH GYRUS (TURBT-GYRUS);  Surgeon: Cleon Gustin, MD;  Location: [facility];  Service: Urology;  Laterality: N/A;    Patient Care Team: Gildardo Cranker, DO as PCP - General (Internal Medicine)  Social History   Social History  . Marital status: Single    Spouse name: N/A  . Number of children: N/A  . Years of education: N/A   Occupational History  . Retired Automotive engineer  Social History Main Topics  . Smoking status: Never Smoker  . Smokeless tobacco: Never Used  . Alcohol use No  . Drug use: No  . Sexual activity: No   Other Topics Concern  . Not on file   Social History Narrative    Diet:   Do you drink/eat things with caffeine? Yes coffee   Marital status: Divorced                              What year were you married?    Do you live in a house, apartment, assisted living, condo, trailer, etc)? House   Is it one or more stories? 1 story   How many  persons live in your home? 4   Do you have any pets in your home? 0   Current or past profession: Dietitian   Do you exercise? Yes                                       Type & how often:  Walk daily   Do you have a living will? No   Do you have a DNR Form? No   Do you have a POA/HPOA forms? No     reports that she has never smoked. She has never used smokeless tobacco. She reports that she does not drink alcohol or use drugs.  Family History  Problem Relation Age of Onset  . CAD Mother 29  . Heart disease Father 36  . CAD Brother 61   Family Status  Relation Status  . Mother Deceased  . Father Deceased  . Sister Alive  . Brother Alive  . Daughter Alive  . Son Alive  . Sister Alive  . Sister Alive  . Brother Deceased  . Brother Deceased  . Brother Deceased  . Brother Deceased  . Brother Deceased     No Known Allergies  Medications: Patient's Medications  New Prescriptions   No medications on file  Previous Medications   AMLODIPINE (NORVASC) 10 MG TABLET    Take 1 tablet (10 mg total) by mouth daily.   ASPIRIN 81 MG CHEWABLE TABLET    Chew 81 mg by mouth daily.   ATORVASTATIN (LIPITOR) 80 MG TABLET    Take 1 tablet (80 mg total) by mouth daily at 6 PM.   NITROGLYCERIN (NITROSTAT) 0.4 MG SL TABLET    Place 1 tablet (0.4 mg total) under the tongue every 5 (five) minutes x 3 doses as needed for chest pain.   RIVAROXABAN (XARELTO) 15 MG TABS TABLET    Take 1 tablet (15 mg total) by mouth 2 (two) times daily with a meal.   RIVAROXABAN (XARELTO) 20 MG TABS TABLET    Take 1 tablet (20 mg total) by mouth daily with supper.  Modified Medications   No medications on file  Discontinued Medications   No medications on file    Review of Systems  Constitutional: Positive for fatigue.  Respiratory: Positive for shortness of breath.   All other systems reviewed and are negative.   Vitals:   03/17/16 1045  BP: 130/78  Pulse: 76  Temp: 98 F (36.7 C)  TempSrc: Oral    SpO2: 98%  Weight: 168 lb 12.8 oz (76.6 kg)  Height: _0  (1.6 m)   Body mass index is 29.9 kg/m.  Physical Exam  Constitutional: She is oriented to person, place, and time. She appears well-developed and well-nourished. No distress.  HENT:  Mouth/Throat: Oropharynx is clear and moist. No oropharyngeal exudate.  Eyes: Pupils are equal, round, and reactive to light. No scleral icterus.  Neck: Neck supple.  Cardiovascular: Normal rate, regular rhythm and intact distal pulses.  Exam reveals no gallop and no friction rub.   Murmur (1/6 SEM) heard. No LE edema b/l. No calf TTP. No palpable cords  Pulmonary/Chest: Effort normal and breath sounds normal. No respiratory distress. She has no wheezes. She has no rales. She exhibits no tenderness.  Abdominal: Soft. Bowel sounds are normal. She exhibits no distension and no mass. There is no tenderness (no CVAT or suprapubic TTP). There is no rebound and no guarding.  No pulsatile mass or bruit noted  Musculoskeletal: She exhibits edema.  Lymphadenopathy:    She has no cervical adenopathy.  Neurological: She is alert and oriented to person, place, and time.  Skin: Skin is warm and dry. No rash noted.  Psychiatric: She has a normal mood and affect. Her behavior is normal. Thought content normal.     Labs reviewed: Preadmission on 02/09/2016  Component Date Value Ref Range Status  . Cath EF Quantitative 02/04/2016 50  % Final  Office Visit on 02/13/2016  Component Date Value Ref Range Status  . WBC 02/14/2016 4.8  3.8 - 10.8 K/uL Final  . RBC 02/14/2016 3.63* 3.80 - 5.10 MIL/uL Final  . Hemoglobin 02/14/2016 8.4* 11.7 - 15.5 g/dL Final  . HCT 02/14/2016 27.5* 35.0 - 45.0 % Final  . MCV 02/14/2016 75.8* 80.0 - 100.0 fL Final  . MCH 02/14/2016 23.1* 27.0 - 33.0 pg Final  . MCHC 02/14/2016 30.5* 32.0 - 36.0 g/dL Final  . RDW 02/14/2016 21.9* 11.0 - 15.0 % Final  . Platelets 02/14/2016 414* 140 - 400 K/uL Final  . MPV 02/14/2016 9.2  7.5  - 12.5 fL Final  . Neutro Abs 02/14/2016 3216  1,500 - 7,800 cells/uL Final  . Lymphs Abs 02/14/2016 1008  850 - 3,900 cells/uL Final  . Monocytes Absolute 02/14/2016 432  200 - 950 cells/uL Final  . Eosinophils Absolute 02/14/2016 144  15 - 500 cells/uL Final  . Basophils Absolute 02/14/2016 0  0 - 200 cells/uL Final  . Neutrophils Relative % 02/14/2016 67  % Final  . Lymphocytes Relative 02/14/2016 21  % Final  . Monocytes Relative 02/14/2016 9  % Final  . Eosinophils Relative 02/14/2016 3  % Final  . Basophils Relative 02/14/2016 0  % Final  . Smear Review 02/14/2016 Criteria for review not met   Final  Admission on 02/03/2016, Discharged on 02/11/2016  No results displayed because visit has over 200 results.    Office Visit on 02/03/2016  Component Date Value Ref Range Status  . Sodium 02/03/2016 140  135 - 146 mmol/L Final  . Potassium 02/03/2016 3.4* 3.5 - 5.3 mmol/L Final  . Chloride 02/03/2016 106  98 - 110 mmol/L Final  . CO2 02/03/2016 23  20 - 31 mmol/L Final  . Glucose, Bld 02/03/2016 109* 65 - 99 mg/dL Final  . BUN 02/03/2016 15  7 - 25 mg/dL Final  . Creat 02/03/2016 0.84  0.60 - 0.93 mg/dL Final   Comment:   For patients > or = 75 years of age: The upper reference limit for Creatinine is approximately 13% higher for people identified as African-American.     . Calcium 02/03/2016 8.9  8.6 -  10.4 mg/dL Final  . GFR, Est African American 02/03/2016 79  >=60 mL/min Final  . GFR, Est Non African American 02/03/2016 69  >=60 mL/min Final  . Brain Natriuretic Peptide 02/03/2016 477.2* <100 pg/mL Final   Comment:   BNP levels increase with age in the general population with the highest values seen in individuals greater than 13 years of age. Reference: Joellyn Rued Cardiol 2002; 74:128-78.     . WBC 02/03/2016 5.0  3.8 - 10.8 K/uL Final  . RBC 02/03/2016 4.87  3.80 - 5.10 MIL/uL Final  . Hemoglobin 02/03/2016 10.7* 11.7 - 15.5 g/dL Final  . HCT 02/03/2016 34.4* 35.0 -  45.0 % Final  . MCV 02/03/2016 70.6* 80.0 - 100.0 fL Final  . MCH 02/03/2016 22.0* 27.0 - 33.0 pg Final  . MCHC 02/03/2016 31.1* 32.0 - 36.0 g/dL Final  . RDW 02/03/2016 20.6* 11.0 - 15.0 % Final  . Platelets 02/03/2016 261  140 - 400 K/uL Final  . MPV 02/03/2016 9.0  7.5 - 12.5 fL Final  . Neutro Abs 02/03/2016 2900  1,500 - 7,800 cells/uL Final  . Lymphs Abs 02/03/2016 1600  850 - 3,900 cells/uL Final  . Monocytes Absolute 02/03/2016 450  200 - 950 cells/uL Final  . Eosinophils Absolute 02/03/2016 50  15 - 500 cells/uL Final  . Basophils Absolute 02/03/2016 0  0 - 200 cells/uL Final  . Neutrophils Relative % 02/03/2016 58  % Final  . Lymphocytes Relative 02/03/2016 32  % Final  . Monocytes Relative 02/03/2016 9  % Final  . Eosinophils Relative 02/03/2016 1  % Final  . Basophils Relative 02/03/2016 0  % Final  . Smear Review 02/03/2016 Criteria for review not met   Final  . aPTT 02/03/2016 25  22 - 34 sec Final   Comment:   This test has not been validated for monitoring unfractionated heparin therapy. For testing that is validated for this type of therapy, please refer to the Heparin Anti-Xa assay (test code [phone]).   For additional information, please refer to http://education.[facility].com/faq/FAQ159 (This link is being provided for informational/educational purposes only.)     . Prothrombin Time 02/03/2016 12.5* 9.0 - 11.5 sec Final   Comment:   For more information on this test, go to: http://education.[facility].com/faq/FAQ104     . INR 02/03/2016 1.2*  Final   Comment:   Reference Range                        0.9-1.1 Moderate-intensity Warfarin Therapy    2.0-3.0 Higher-intensity Warfarin Therapy      3.0-4.0     . TSH 02/03/2016 1.87  mIU/L Final   Comment:   Reference Range   > or = 20 Years  0.40-4.50   Pregnancy Range First trimester  0.26-2.66 Second trimester 0.55-2.73 Third trimester  0.43-2.91     . Troponin I 02/03/2016 0.43*  <=0.05 ng/mL Final   Comment:   In accord with published recommendations, serial testing of Troponin I at intervals of 2 to 4 hours for up to 12 to 24 hours is suggested in order to corroborate a single Troponin I result. An elevated troponin alone is not sufficient to make the diagnosis of MI. ** Please note change in reference range(s). **     Office Visit on 02/02/2016  Component Date Value Ref Range Status  . WBC 02/02/2016 5.4  3.8 - 10.8 K/uL Final  . RBC 02/02/2016 4.72  3.80 - 5.10  MIL/uL Final   Comment: Polychromasia present (1-2/hpf) Mixed RBC population   . Hemoglobin 02/02/2016 10.7* 11.7 - 15.5 g/dL Final  . HCT 02/02/2016 34.8* 35.0 - 45.0 % Final  . MCV 02/02/2016 73.7* 80.0 - 100.0 fL Final  . MCH 02/02/2016 22.7* 27.0 - 33.0 pg Final  . MCHC 02/02/2016 30.7* 32.0 - 36.0 g/dL Final  . RDW 02/02/2016 20.3* 11.0 - 15.0 % Final  . Platelets 02/02/2016 277  140 - 400 K/uL Final  . MPV 02/02/2016 9.4  7.5 - 12.5 fL Final  . Neutro Abs 02/02/2016 2862  1,500 - 7,800 cells/uL Final  . Lymphs Abs 02/02/2016 1836  850 - 3,900 cells/uL Final  . Monocytes Absolute 02/02/2016 648  200 - 950 cells/uL Final  . Eosinophils Absolute 02/02/2016 54  15 - 500 cells/uL Final  . Basophils Absolute 02/02/2016 0  0 - 200 cells/uL Final  . Neutrophils Relative % 02/02/2016 53  % Final  . Lymphocytes Relative 02/02/2016 34  % Final  . Monocytes Relative 02/02/2016 12  % Final  . Eosinophils Relative 02/02/2016 1  % Final  . Basophils Relative 02/02/2016 0  % Final  . Smear Review 02/02/2016 Criteria for review not met   Final  . Sodium 02/02/2016 140  135 - 146 mmol/L Final  . Potassium 02/02/2016 4.1  3.5 - 5.3 mmol/L Final  . Chloride 02/02/2016 107  98 - 110 mmol/L Final  . CO2 02/02/2016 25  20 - 31 mmol/L Final  . Glucose, Bld 02/02/2016 116* 65 - 99 mg/dL Final  . BUN 02/02/2016 13  7 - 25 mg/dL Final  . Creat 02/02/2016 0.76  0.60 - 0.93 mg/dL Final   Comment:   For patients  > or = 75 years of age: The upper reference limit for Creatinine is approximately 13% higher for people identified as African-American.     . Calcium 02/02/2016 9.4  8.6 - 10.4 mg/dL Final  . GFR, Est African American 02/02/2016 89  >=60 mL/min Final  . GFR, Est Non African American 02/02/2016 78  >=60 mL/min Final    No results found.   Assessment/Plan   ICD-9-CM ICD-10-CM   1. Saddle embolus of pulmonary artery without acute cor pulmonale, unspecified chronicity (HCC) 415.13 I26.92 CMP with eGFR   subacute  2. 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 Essential hypertension 401.9 I10 amLODipine (NORVASC) 10 MG tablet     CMP with eGFR  4. Physical deconditioning 799.3 R53.81    improved  5. Iron deficiency anemia, unspecified iron deficiency anemia type 280.9 D50.9 CBC with Differential/Platelets   May resume Vitamin C daily  Continue other medications as ordered  Will call with lab results  Follow up with specialists as scheduled  Follow up in 2 mos for routine visit.  Davena Julian S. Perlie Gold  [facility] [address] [place], [place] [address] [phone] Cell (Monday-Friday 8 AM - 5 PM) [phone] After 5 PM and follow prompts

## 2016-04-08 DIAGNOSIS — C670 Malignant neoplasm of trigone of bladder: Secondary | ICD-10-CM | POA: Diagnosis not present

## 2016-04-09 NOTE — Addendum Note (Signed)
Addended by: Therisa Doyne on: 04/09/2016 08:19 AM   Modules accepted: Orders

## 2016-04-13 NOTE — Addendum Note (Signed)
Addended by: Therisa Doyne on: 04/13/2016 10:03 AM   Modules accepted: Orders

## 2016-05-07 ENCOUNTER — Ambulatory Visit (INDEPENDENT_AMBULATORY_CARE_PROVIDER_SITE_OTHER): Payer: PPO

## 2016-05-07 VITALS — BP 138/80 | HR 77 | Temp 97.7°F | Ht 63.0 in | Wt 164.6 lb

## 2016-05-07 DIAGNOSIS — H543 Unqualified visual loss, both eyes: Secondary | ICD-10-CM

## 2016-05-07 DIAGNOSIS — Z0000 Encounter for general adult medical examination without abnormal findings: Secondary | ICD-10-CM | POA: Diagnosis not present

## 2016-05-07 NOTE — Progress Notes (Signed)
Subjective:   Catherine Snyder is a 76 y.o. female who presents for Medicare Annual (Subsequent) preventive examination.  Review of Systems:  Cardiac Risk Factors include: advanced age (>64men, >59 women);family history of premature cardiovascular disease;hypertension;obesity (BMI >30kg/m2)     Objective:     Vitals: BP 138/80 (BP Location: Left Arm, Patient Position: Sitting, Cuff Size: Normal)   Pulse 77   Temp 97.7 F (36.5 C) (Oral)   Ht 5\' 3"  (1.6 m)   Wt 164 lb 9.6 oz (74.7 kg)   SpO2 99%   BMI 29.16 kg/m   Body mass index is 29.16 kg/m.   Tobacco History  Smoking Status  . Never Smoker  Smokeless Tobacco  . Never Used     Counseling given: No   Past Medical History:  Diagnosis Date  . Bladder cancer ([place])    a. s/p resection in 06/2015  . Dysuria   . Frequency of urination   . Hematuria   . Hypertension   . Nocturia   . Wears glasses   . Wears partial dentures    upper   Past Surgical History:  Procedure Laterality Date  . CARDIAC CATHETERIZATION N/A 02/04/2016   Procedure: RIGHT/LEFT HEART CATH AND CORONARY ANGIOGRAPHY;  Surgeon: Troy Sine, MD;  Location: [place] [facility];  Service: Cardiovascular;  Laterality: N/A;  . CYSTOSCOPY W/ RETROGRADES Bilateral 07/07/2015   Procedure: CYSTOSCOPY WITH RETROGRADE PYELOGRAM;  Surgeon: Cleon Gustin, MD;  Location: [facility];  Service: Urology;  Laterality: Bilateral;  . IR GENERIC HISTORICAL  02/07/2016   IR ANGIOGRAM SELECTIVE EACH ADDITIONAL VESSEL 02/07/2016 Jacqulynn Cadet, MD [facility]  . IR GENERIC HISTORICAL  02/07/2016   IR US GUIDE VASC ACCESS RIGHT 02/07/2016 Jacqulynn Cadet, MD [facility]  . IR GENERIC HISTORICAL  02/07/2016   IR ANGIOGRAM PULMONARY BILATERAL SELECTIVE 02/07/2016 Jacqulynn Cadet, MD [facility]  . IR GENERIC HISTORICAL  02/07/2016   IR INFUSION THROMBOL ARTERIAL INITIAL (MS) 02/07/2016 Jacqulynn Cadet, MD [facility]  . IR GENERIC  HISTORICAL  02/07/2016   IR INFUSION THROMBOL ARTERIAL INITIAL (MS) 02/07/2016 Jacqulynn Cadet, MD [facility]  . IR GENERIC HISTORICAL  02/07/2016   IR ANGIOGRAM SELECTIVE EACH ADDITIONAL VESSEL 02/07/2016 Jacqulynn Cadet, MD [facility]  . NO PAST SURGERIES    . TRANSURETHRAL RESECTION OF BLADDER TUMOR WITH GYRUS (TURBT-GYRUS) N/A 07/07/2015   Procedure: TRANSURETHRAL RESECTION OF BLADDER TUMOR WITH GYRUS (TURBT-GYRUS);  Surgeon: Cleon Gustin, MD;  Location: [facility];  Service: Urology;  Laterality: N/A;   Family History  Problem Relation Age of Onset  . CAD Mother 36  . Heart disease Father 30  . CAD Brother 49   History  Sexual Activity  . Sexual activity: No    Outpatient Encounter Prescriptions as of 05/07/2016  Medication Sig  . amLODipine (NORVASC) 10 MG tablet Take 1 tablet (10 mg total) by mouth daily.  Marland Kitchen aspirin 81 MG chewable tablet Chew 81 mg by mouth daily.  Marland Kitchen atorvastatin (LIPITOR) 80 MG tablet Take 1 tablet (80 mg total) by mouth daily at 6 PM.  . rivaroxaban (XARELTO) 20 MG TABS tablet Take 1 tablet (20 mg total) by mouth daily with supper.  . nitroGLYCERIN (NITROSTAT) 0.4 MG SL tablet Place 1 tablet (0.4 mg total) under the tongue every 5 (five) minutes x 3 doses as needed for chest pain. (Patient not taking: Reported on 05/07/2016)   No facility-administered encounter medications on file as of 05/07/2016.  Activities of Daily Living In your present state of health, do you have any difficulty performing the following activities: 05/07/2016 02/04/2016  Hearing? N N  Vision? Y N  Difficulty concentrating or making decisions? N N  Walking or climbing stairs? N N  Dressing or bathing? N N  Doing errands, shopping? Y N  Preparing Food and eating ? N -  Using the Toilet? N -  In the past six months, have you accidently leaked urine? N -  Do you have problems with loss of bowel control? N -  Managing your Medications? Y -  Managing  your Finances? N -  Housekeeping or managing your Housekeeping? N -  Some recent data might be hidden    Patient Care Team: Gildardo Cranker, DO as PCP - General (Internal Medicine)    Assessment:     Exercise Activities and Dietary recommendations Current Exercise Habits: Home exercise routine, Type of exercise: stretching;calisthenics;walking, Time (Minutes): 15, Frequency (Times/Week): 7, Weekly Exercise (Minutes/Week): 105, Intensity: Mild  Goals    . <enter goal here>          Starting 05/07/16, I will maintain my current lifestyle.       Fall Risk Fall Risk  05/07/2016 03/17/2016 02/13/2016 02/02/2016 05/07/2015  Falls in the past year? No No No No No   Depression Screen PHQ 2/9 Scores 05/07/2016 05/07/2015  PHQ - 2 Score 0 0     Cognitive Function MMSE - Mini Mental State Exam 05/07/2016 05/07/2015  Not completed: - (No Data)  Orientation to time 5 4  Orientation to Place 4 4  Registration 3 3  Attention/ Calculation 5 5  Recall 3 2  Language- name 2 objects 2 2  Language- repeat 1 0  Language- follow 3 step command 3 3  Language- read & follow direction 1 1  Write a sentence 0 1  Copy design 1 0  Total score 28 25        Immunization History  Administered Date(s) Administered  . Influenza,inj,Quad PF,36+ Mos 11/27/2014   Screening Tests Health Maintenance  Topic Date Due  . DEXA SCAN  04/15/2017 (Originally 01/08/2007)  . COLONOSCOPY  04/15/2017 (Originally 01/08/1992)  . PNA vac Low Risk Adult (1 of 2 - PCV13) 04/15/2018 (Originally 01/08/2007)  . INFLUENZA VACCINE  04/16/2019 (Originally 10/14/2015)  . TETANUS/TDAP  04/16/2019 (Originally 01[birth year]/1961)  . MAMMOGRAM  12/10/2017      Plan:    I have personally reviewed and addressed the Medicare Annual Wellness questionnaire and have noted the following in the patient's chart:  A. Medical and social history B. Use of alcohol, tobacco or illicit drugs  C. Current medications and supplements D. Functional  ability and status E.  Nutritional status F.  Physical activity G. Advance directives H. List of other physicians I.  Hospitalizations, surgeries, and ER visits in previous 12 months J.  [place] to include hearing, vision, cognitive, depression L. Referrals and appointments - none  In addition, I have reviewed and discussed with patient certain preventive protocols, quality metrics, and best practice recommendations. A written personalized care plan for preventive services as well as general preventive health recommendations were provided to patient.  See attached scanned questionnaire for additional information.   Signed,   Allyn Kenner, LPN Health Advisor  I have reviewed the health advisor's note and was available for consultation. I agree with documentation and plan.   Ryett Hamman S. [facility] [place], [place] [address] [phone] Cell (Monday-Friday 8 AM - 5 PM) [phone] After 5 PM and follow prompts

## 2016-05-07 NOTE — Progress Notes (Signed)
Quick Notes   Health Maintenance:  Pt declines vaccines; states she had her MMG last year (no record) and she is suppose to be getting set up for colonoscopy.    Abnormal Screen: MMSE- 28/30 Passed clock test    Patient Concerns:  None    Nurse Concerns:  None

## 2016-05-07 NOTE — Patient Instructions (Addendum)
Catherine Snyder , Thank you for taking time to come for your Medicare Wellness Visit. I appreciate your ongoing commitment to your health goals. Please review the following plan we discussed and let me know if I can assist you in the future.   These are the goals we discussed: Goals    . <enter goal here>          Starting 05/07/16, I will maintain my current lifestyle.        This is a list of the screening recommended for you and due dates:  Health Maintenance  Topic Date Due  . DEXA scan (bone density measurement)  04/15/2017*  . Colon Cancer Screening  04/15/2017*  . Pneumonia vaccines (1 of 2 - PCV13) 04/15/2018*  . Flu Shot  04/16/2019*  . Tetanus Vaccine  04/16/2019*  . Mammogram  12/10/2017  *Topic was postponed. The date shown is not the original due date.  Preventive Care for Adults  A healthy lifestyle and preventive care can promote health and wellness. Preventive health guidelines for adults include the following key practices.  . A routine yearly physical is a good way to check with your health care provider about your health and preventive screening. It is a chance to share any concerns and updates on your health and to receive a thorough exam.  . Visit your dentist for a routine exam and preventive care every 6 months. Brush your teeth twice a day and floss once a day. Good oral hygiene prevents tooth decay and gum disease.  . The frequency of eye exams is based on your age, health, family medical history, use  of contact lenses, and other factors. Follow your health care provider's ecommendations for frequency of eye exams.  . Eat a healthy diet. Foods like vegetables, fruits, whole grains, low-fat dairy products, and lean protein foods contain the nutrients you need without too many calories. Decrease your intake of foods high in solid fats, added sugars, and salt. Eat the right amount of calories for you. Get information about a proper diet from your health care  provider, if necessary.  . Regular physical exercise is one of the most important things you can do for your health. Most adults should get at least 150 minutes of moderate-intensity exercise (any activity that increases your heart rate and causes you to sweat) each week. In addition, most adults need muscle-strengthening exercises on 2 or more days a week.  Silver Sneakers may be a benefit available to you. To determine eligibility, you may visit the website: www.silversneakers.com or contact program at 336-260-3582 Mon-Fri between 8AM-8PM.   . Maintain a healthy weight. The body mass index (BMI) is a screening tool to identify possible weight problems. It provides an estimate of body fat based on height and weight. Your health care provider can find your BMI and can help you achieve or maintain a healthy weight.   For adults 20 years and older: ? A BMI below 18.5 is considered underweight. ? A BMI of 18.5 to 24.9 is normal. ? A BMI of 25 to 29.9 is considered overweight. ? A BMI of 30 and above is considered obese.   . Maintain normal blood lipids and cholesterol levels by exercising and minimizing your intake of saturated fat. Eat a balanced diet with plenty of fruit and vegetables. Blood tests for lipids and cholesterol should begin at age 1 and be repeated every 5 years. If your lipid or cholesterol levels are high, you are over 50, or  you are at high risk for heart disease, you may need your cholesterol levels checked more frequently. Ongoing high lipid and cholesterol levels should be treated with medicines if diet and exercise are not working.  . If you smoke, find out from your health care provider how to quit. If you do not use tobacco, please do not start.  . If you choose to drink alcohol, please do not consume more than 2 drinks per day. One drink is considered to be 12 ounces (355 mL) of beer, 5 ounces (148 mL) of wine, or 1.5 ounces (44 mL) of liquor.  . If you are 71-79 years  old, ask your health care provider if you should take aspirin to prevent strokes.  . Use sunscreen. Apply sunscreen liberally and repeatedly throughout the day. You should seek shade when your shadow is shorter than you. Protect yourself by wearing long sleeves, pants, a wide-brimmed hat, and sunglasses year round, whenever you are outdoors.  . Once a month, do a whole body skin exam, using a mirror to look at the skin on your back. Tell your health care provider of new moles, moles that have irregular borders, moles that are larger than a pencil eraser, or moles that have changed in shape or color.

## 2016-05-14 ENCOUNTER — Encounter: Payer: Self-pay | Admitting: Internal Medicine

## 2016-05-14 ENCOUNTER — Ambulatory Visit (INDEPENDENT_AMBULATORY_CARE_PROVIDER_SITE_OTHER): Payer: PPO | Admitting: Internal Medicine

## 2016-05-14 VITALS — BP 138/82 | HR 68 | Temp 98.2°F | Ht 63.0 in | Wt 164.4 lb

## 2016-05-14 DIAGNOSIS — I2692 Saddle embolus of pulmonary artery without acute cor pulmonale: Secondary | ICD-10-CM

## 2016-05-14 DIAGNOSIS — G2581 Restless legs syndrome: Secondary | ICD-10-CM | POA: Diagnosis not present

## 2016-05-14 DIAGNOSIS — S0012XA Contusion of left eyelid and periocular area, initial encounter: Secondary | ICD-10-CM

## 2016-05-14 DIAGNOSIS — Z79899 Other long term (current) drug therapy: Secondary | ICD-10-CM | POA: Diagnosis not present

## 2016-05-14 MED ORDER — ROPINIROLE HCL 0.25 MG PO TABS: 0.2500 mg | ORAL_TABLET | Freq: Every day | ORAL | 3 refills | Status: DC

## 2016-05-14 NOTE — Progress Notes (Signed)
Patient ID: Catherine Snyder, female   DOB: 22-Oct-1940, 76 y.o.   MRN: [id number]    Location:  [facility] Place of Service: OFFICE  Chief Complaint  Patient presents with  . Acute Visit    patient fell Wednesday night has knot above left eye, states its not painful    HPI:  76 yo female seen today for eval following fall on early AM 05/13/16 while walking to bathroom. She tripped over a blanket on the floor and fell forward onto left face. She has swelling surrounding eye. No visual issues. No bleeding. No HA or dizziness. No pain. She is on xeralto for PE  She also continues to have leg cramps and sleeps in chair all night. She eats yellow mustard which helps a little. Cramps improve with ambulating and worsens with lying supine  HTN - BP controlled on amlodipine. She takes ASA daily  Bladder cancer (dx March 2017) - s/p resection. Prior to dx, She had intermittent hematuria (passing blood clots) at end of urine stream x 1 yr. No dysuria but had increased frequency. Non smoker. Followed by urology  She relocated from [place] in March 2015  CAD - nonobstructive. Cath done 02/04/16. She takes lipitor for hyperlipidemia. She has prn SLNTG. Followed by cardio Dr Claiborne Billings  Anemia - Hgb 8.4. She is s/p 2 units PRBCs. 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 On xeralto until June 2018    Past Medical History:  Diagnosis Date  . Bladder cancer ([place])    a. s/p resection in 06/2015  . Dysuria   . Frequency of urination   . Hematuria   . Hypertension   . Nocturia   . Wears glasses   . Wears partial dentures    upper    Past Surgical History:  Procedure Laterality Date  . CARDIAC CATHETERIZATION N/A 02/04/2016   Procedure: RIGHT/LEFT HEART CATH AND CORONARY ANGIOGRAPHY;  Surgeon: Troy Sine, MD;  Location: [facility];  Service: Cardiovascular;  Laterality: N/A;  . CYSTOSCOPY W/ RETROGRADES Bilateral 07/07/2015   Procedure: CYSTOSCOPY WITH RETROGRADE  PYELOGRAM;  Surgeon: Cleon Gustin, MD;  Location[facility];  Service: Urology;  Laterality: Bilateral;  . IR GENERIC HISTORICAL  02/07/2016   IR ANGIOGRAM SELECTIVE EACH ADDITIONAL VESSEL 02/07/2016 Jacqulynn Cadet, MD [facility]  . IR GENERIC HISTORICAL  02/07/2016   IR US GUIDE VASC ACCESS RIGHT 02/07/2016 Jacqulynn Cadet, MD [facility]  . IR GENERIC HISTORICAL  02/07/2016   IR ANGIOGRAM PULMONARY BILATERAL SELECTIVE 02/07/2016 Jacqulynn Cadet, MD [facility]  . IR GENERIC HISTORICAL  02/07/2016   IR INFUSION THROMBOL ARTERIAL INITIAL (MS) 02/07/2016 Jacqulynn Cadet, MD [facility]  . IR GENERIC HISTORICAL  02/07/2016   IR INFUSION THROMBOL ARTERIAL INITIAL (MS) 02/07/2016 Jacqulynn Cadet, MD [facility]  . IR GENERIC HISTORICAL  02/07/2016   IR ANGIOGRAM SELECTIVE EACH ADDITIONAL VESSEL 02/07/2016 Jacqulynn Cadet, MD [facility]  . NO PAST SURGERIES    . TRANSURETHRAL RESECTION OF BLADDER TUMOR WITH GYRUS (TURBT-GYRUS) N/A 07/07/2015   Procedure: TRANSURETHRAL RESECTION OF BLADDER TUMOR WITH GYRUS (TURBT-GYRUS);  Surgeon: Cleon Gustin, MD;  Location: [facility];  Service: Urology;  Laterality: N/A;    Patient Care Team: Gildardo Cranker, DO as PCP - General (Internal Medicine)  Social History   Social History  . Marital status: Single    Spouse name: N/A  . Number of children: N/A  . Years of education:  N/A   Occupational History  . Retired Automotive engineer    Social History Main Topics  . Smoking status: Never Smoker  . Smokeless tobacco: Never Used  . Alcohol use No  . Drug use: No  . Sexual activity: No   Other Topics Concern  . Not on file   Social History Narrative    Diet:   Do you drink/eat things with caffeine? Yes coffee   Marital status: Divorced                              What year were you married?    Do you live in a house, apartment, assisted living, condo, trailer, etc)? House   Is it one or more  stories? 1 story   How many persons live in your home? 4   Do you have any pets in your home? 0   Current or past profession: Dietitian   Do you exercise? Yes                                       Type & how often:  Walk daily   Do you have a living will? No   Do you have a DNR Form? No   Do you have a POA/HPOA forms? No     reports that she has never smoked. She has never used smokeless tobacco. She reports that she does not drink alcohol or use drugs.  Family History  Problem Relation Age of Onset  . CAD Mother 50  . Heart disease Father 33  . CAD Brother 38   Family Status  Relation Status  . Mother Deceased  . Father Deceased  . Sister Alive  . Brother Alive  . Daughter Alive  . Son Alive  . Sister Alive  . Sister Alive  . Brother Deceased  . Brother Deceased  . Brother Deceased  . Brother Deceased  . Brother Deceased     No Known Allergies  Medications: Patient's Medications  New Prescriptions   No medications on file  Previous Medications   AMLODIPINE (NORVASC) 10 MG TABLET    Take 1 tablet (10 mg total) by mouth daily.   ASPIRIN 81 MG CHEWABLE TABLET    Chew 81 mg by mouth daily.   ATORVASTATIN (LIPITOR) 80 MG TABLET    Take 1 tablet (80 mg total) by mouth daily at 6 PM.   NITROGLYCERIN (NITROSTAT) 0.4 MG SL TABLET    Place 1 tablet (0.4 mg total) under the tongue every 5 (five) minutes x 3 doses as needed for chest pain.   RIVAROXABAN (XARELTO) 20 MG TABS TABLET    Take 1 tablet (20 mg total) by mouth daily with supper.  Modified Medications   No medications on file  Discontinued Medications   No medications on file    Review of Systems  Eyes: Positive for visual disturbance (blurry but not new).  All other systems reviewed and are negative.   Vitals:   05/14/16 1150  BP: 138/82  Pulse: 68  Temp: 98.2 F (36.8 C)  TempSrc: Oral  SpO2: 98%  Weight: 164 lb 6.4 oz (74.6 kg)  Height: '5\' 3"'  (1.6 m)   Body mass index is 29.12 kg/m.  Physical  Exam  Constitutional: She is oriented to person, place, and time. She appears well-developed. No distress.  Looks well  in NAD  HENT:  Head: Normocephalic. Head is with contusion. Head is without raccoon's eyes, without abrasion, without laceration and without left periorbital erythema. Hair is normal.    Nose: No nasal deformity. Right sinus exhibits no maxillary sinus tenderness and no frontal sinus tenderness. Left sinus exhibits no maxillary sinus tenderness and no frontal sinus tenderness.  Mouth/Throat: Uvula is midline, oropharynx is clear and moist and mucous membranes are normal.  Eyes: EOM are normal. Pupils are equal, round, and reactive to light. Right eye exhibits no discharge. Left eye exhibits no discharge. No scleral icterus.  No corneal redness or orbital TTP  Neck: Neck supple. No muscular tenderness present. Decreased range of motion present. No thyromegaly present.    Cardiovascular:  No palpable LE cord, NT. No calf TTP. No LE edema  Musculoskeletal: She exhibits edema.  Lymphadenopathy:    She has no cervical adenopathy.  Neurological: She is alert and oriented to person, place, and time.  Skin: Skin is warm and dry. No rash noted.  Psychiatric: She has a normal mood and affect. Her behavior is normal. Judgment and thought content normal.     Labs reviewed: Office Visit on 03/17/2016  Component Date Value Ref Range Status  . WBC 03/17/2016 4.2  3.8 - 10.8 K/uL Final  . RBC 03/17/2016 3.74* 3.80 - 5.10 MIL/uL Final  . Hemoglobin 03/17/2016 8.3* 11.7 - 15.5 g/dL Final  . HCT 03/17/2016 28.1* 35.0 - 45.0 % Final  . MCV 03/17/2016 75.1* 80.0 - 100.0 fL Final  . MCH 03/17/2016 22.2* 27.0 - 33.0 pg Final  . MCHC 03/17/2016 29.5* 32.0 - 36.0 g/dL Final  . RDW 03/17/2016 19.7* 11.0 - 15.0 % Final  . Platelets 03/17/2016 391  140 - 400 K/uL Final  . MPV 03/17/2016 9.1  7.5 - 12.5 fL Final  . Neutro Abs 03/17/2016 2268  1,500 - 7,800 cells/uL Final  . Lymphs Abs  03/17/2016 1260  850 - 3,900 cells/uL Final  . Monocytes Absolute 03/17/2016 462  200 - 950 cells/uL Final  . Eosinophils Absolute 03/17/2016 168  15 - 500 cells/uL Final  . Basophils Absolute 03/17/2016 42  0 - 200 cells/uL Final  . Neutrophils Relative % 03/17/2016 54  % Final  . Lymphocytes Relative 03/17/2016 30  % Final  . Monocytes Relative 03/17/2016 11  % Final  . Eosinophils Relative 03/17/2016 4  % Final  . Basophils Relative 03/17/2016 1  % Final  . Smear Review 03/17/2016 Criteria for review not met   Final  . Sodium 03/17/2016 138  135 - 146 mmol/L Final  . Potassium 03/17/2016 4.0  3.5 - 5.3 mmol/L Final  . Chloride 03/17/2016 105  98 - 110 mmol/L Final  . CO2 03/17/2016 25  20 - 31 mmol/L Final  . Glucose, Bld 03/17/2016 113* 65 - 99 mg/dL Final  . BUN 03/17/2016 18  7 - 25 mg/dL Final  . Creat 03/17/2016 0.75  0.60 - 0.93 mg/dL Final   Comment:   For patients > or = 75 years of age: The upper reference limit for Creatinine is approximately 13% higher for people identified as African-American.     . Total Bilirubin 03/17/2016 0.4  0.2 - 1.2 mg/dL Final  . Alkaline Phosphatase 03/17/2016 136* 33 - 130 U/L Final  . AST 03/17/2016 23  10 - 35 U/L Final  . ALT 03/17/2016 17  6 - 29 U/L Final  . Total Protein 03/17/2016 7.2  6.1 - 8.1 g/dL Final  .  Albumin 03/17/2016 3.6  3.6 - 5.1 g/dL Final  . Calcium 03/17/2016 9.0  8.6 - 10.4 mg/dL Final  . GFR, Est African American 03/17/2016 >89  >=60 mL/min Final  . GFR, Est Non African American 03/17/2016 79  >=60 mL/min Final    No results found.   Assessment/Plan   ICD-9-CM ICD-10-CM   1. Contusion of left periocular region, initial encounter 921.1 S00.12XA   2. RLS (restless legs syndrome) 333.94 G25.81 rOPINIRole (REQUIP) 0.25 MG tablet  3. Saddle embolus of pulmonary artery without acute cor pulmonale, unspecified chronicity (HCC) 415.13 I26.92   4. High risk medication use V58.69 Z79.899    START REQUIP FOR RESTLESS  LEGS  Apply cool compress to left forehead daily x 15 minutes. Use at least 4 days  Continue other medications as ordered  Follow up as scheduled next week    Damyan Corne S. Perlie Gold  [facility] [address] [place], [place] [address] [phone] Cell (Monday-Friday 8 AM - 5 PM) [phone] After 5 PM and follow prompts

## 2016-05-14 NOTE — Patient Instructions (Signed)
START REQUIP FOR RESTLESS LEGS  Apply cool compress to left forehead daily x 15 minutes. Use at least 4 days  Continue other medications as ordered  Follow up as scheduled next week

## 2016-05-20 ENCOUNTER — Ambulatory Visit: Payer: PPO | Admitting: Cardiovascular Disease

## 2016-05-21 ENCOUNTER — Ambulatory Visit (INDEPENDENT_AMBULATORY_CARE_PROVIDER_SITE_OTHER): Payer: PPO | Admitting: Internal Medicine

## 2016-05-21 ENCOUNTER — Encounter: Payer: Self-pay | Admitting: Internal Medicine

## 2016-05-21 VITALS — BP 140/84 | HR 71 | Temp 98.3°F | Ht 63.0 in | Wt 167.0 lb

## 2016-05-21 DIAGNOSIS — S0012XD Contusion of left eyelid and periocular area, subsequent encounter: Secondary | ICD-10-CM

## 2016-05-21 DIAGNOSIS — I2692 Saddle embolus of pulmonary artery without acute cor pulmonale: Secondary | ICD-10-CM | POA: Diagnosis not present

## 2016-05-21 DIAGNOSIS — D509 Iron deficiency anemia, unspecified: Secondary | ICD-10-CM | POA: Diagnosis not present

## 2016-05-21 DIAGNOSIS — Z79899 Other long term (current) drug therapy: Secondary | ICD-10-CM | POA: Insufficient documentation

## 2016-05-21 DIAGNOSIS — G2581 Restless legs syndrome: Secondary | ICD-10-CM

## 2016-05-21 DIAGNOSIS — R040 Epistaxis: Secondary | ICD-10-CM

## 2016-05-21 DIAGNOSIS — E782 Mixed hyperlipidemia: Secondary | ICD-10-CM

## 2016-05-21 DIAGNOSIS — I10 Essential (primary) hypertension: Secondary | ICD-10-CM

## 2016-05-21 HISTORY — DX: Restless legs syndrome: G25.81

## 2016-05-21 LAB — BASIC METABOLIC PANEL WITH GFR
BUN: 10 mg/dL (ref 7–25)
CO2: 25 mmol/L (ref 20–31)
CREATININE: 0.66 mg/dL (ref 0.60–0.93)
Calcium: 8.8 mg/dL (ref 8.6–10.4)
Chloride: 108 mmol/L (ref 98–110)
GFR, Est Non African American: 87 mL/min (ref >=60)
Glucose, Bld: 99 mg/dL (ref 65–99)
POTASSIUM: 3.8 mmol/L (ref 3.5–5.3)
SODIUM: 141 mmol/L (ref 135–146)

## 2016-05-21 LAB — LIPID PANEL
CHOLESTEROL: 83 mg/dL (ref <200)
HDL: 43 mg/dL — ABNORMAL LOW (ref >50)
LDL Cholesterol: 31 mg/dL (ref <100)
Total CHOL/HDL Ratio: 1.9 Ratio (ref <5.0)
Triglycerides: 45 mg/dL (ref <150)
VLDL: 9 mg/dL (ref <30)

## 2016-05-21 LAB — CBC WITH DIFFERENTIAL/PLATELET
BASOS PCT: 1 %
Basophils Absolute: 43 cells/uL (ref 0–200)
Eosinophils Absolute: 86 cells/uL (ref 15–500)
Eosinophils Relative: 2 %
HCT: 26.1 % — ABNORMAL LOW (ref 35.0–45.0)
HEMOGLOBIN: 7.7 g/dL — AB (ref 11.7–15.5)
LYMPHS ABS: 1419 {cells}/uL (ref 850–3900)
Lymphocytes Relative: 33 %
MCH: 21 pg — ABNORMAL LOW (ref 27.0–33.0)
MCHC: 29.5 g/dL — ABNORMAL LOW (ref 32.0–36.0)
MCV: 71.1 fL — ABNORMAL LOW (ref 80.0–100.0)
MONO ABS: 473 {cells}/uL (ref 200–950)
MPV: 9.2 fL (ref 7.5–12.5)
Monocytes Relative: 11 %
Neutro Abs: 2279 cells/uL (ref 1500–7800)
Neutrophils Relative %: 53 %
Platelets: 431 10*3/uL — ABNORMAL HIGH (ref 140–400)
RBC: 3.67 MIL/uL — AB (ref 3.80–5.10)
RDW: 19.7 % — ABNORMAL HIGH (ref 11.0–15.0)
WBC: 4.3 10*3/uL (ref 3.8–10.8)

## 2016-05-21 LAB — ALT: ALT: 16 U/L (ref 6–29)

## 2016-05-21 NOTE — Patient Instructions (Addendum)
Apply cool compress 2 times daily x 15 minutes each time to left face/eye to reduce swelling  Use saline nasal spray as needed to nose to keep nose moist  Continue current medications as ordered  Follow up with specialists as scheduled  Will call with lab results  Follow up in 4 mos for RLS and anemia

## 2016-05-21 NOTE — Progress Notes (Signed)
Patient ID: Catherine Snyder, female   DOB: [birth year], 76 y.o.   MRN: [id number]    Location:  [facility] Place of Service: OFFICE  Chief Complaint  Patient presents with  . Medical Management of Chronic Issues    Medical Management of Embolus, Hypertension, Pulmonary HTN, Anemia and Contusion    HPI:  76 yo female seen today for f/u. Restless legs/cramps improved on requip. She did not apply ice to left facial contusion as she forgot. Area is still tender but swelling improved  S/p fall - 05/13/16 while walking to bathroom. She tripped over a blanket on the floor and fell forward onto left face. She has swelling surrounding eye.  RLS - stable on requip  HTN - BP controlled on amlodipine. She takes ASA daily  Bladder cancer (dx March 2017) - s/p resection. Prior to dx, She had intermittent hematuria (passing blood clots) at end of urine stream x 1 yr. No dysuria but had increased frequency. Non smoker. Followed by urology  She relocated from [place] in March 2015  CAD - nonobstructive. Cath done 02/04/16. She takes lipitor for hyperlipidemia. She has prn SLNTG. Followed by cardio Dr Claiborne Billings  Anemia - Hgb 8.4. She is s/p 2 units PRBCs. 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 On xeralto until June 2018   Past Medical History:  Diagnosis Date  . Bladder cancer ([place])    a. s/p resection in 06/2015  . Dysuria   . Frequency of urination   . Hematuria   . Hypertension   . Nocturia   . Wears glasses   . Wears partial dentures    upper    Past Surgical History:  Procedure Laterality Date  . CARDIAC CATHETERIZATION N/A 02/04/2016   Procedure: RIGHT/LEFT HEART CATH AND CORONARY ANGIOGRAPHY;  Surgeon: Troy Sine, MD;  Location: [facility];  Service: Cardiovascular;  Laterality: N/A;  . CYSTOSCOPY W/ RETROGRADES Bilateral 07/07/2015   Procedure: CYSTOSCOPY WITH RETROGRADE PYELOGRAM;  Surgeon: Cleon Gustin, MD;  Location: [facility];   Service: Urology;  Laterality: Bilateral;  . IR GENERIC HISTORICAL  02/07/2016   IR ANGIOGRAM SELECTIVE EACH ADDITIONAL VESSEL 02/07/2016 Jacqulynn Cadet, MD [facility]  . IR GENERIC HISTORICAL  02/07/2016   IR US GUIDE VASC ACCESS RIGHT 02/07/2016 Jacqulynn Cadet, MD [facility]  . IR GENERIC HISTORICAL  02/07/2016   IR ANGIOGRAM PULMONARY BILATERAL SELECTIVE 02/07/2016 Jacqulynn Cadet, MD [facility]  . IR GENERIC HISTORICAL  02/07/2016   IR INFUSION THROMBOL ARTERIAL INITIAL (MS) 02/07/2016 Jacqulynn Cadet, MD [facility]  . IR GENERIC HISTORICAL  02/07/2016   IR INFUSION THROMBOL ARTERIAL INITIAL (MS) 02/07/2016 Jacqulynn Cadet, MD [facility]  . IR GENERIC HISTORICAL  02/07/2016   IR ANGIOGRAM SELECTIVE EACH ADDITIONAL VESSEL 02/07/2016 Jacqulynn Cadet, MD [facility]  . NO PAST SURGERIES    . TRANSURETHRAL RESECTION OF BLADDER TUMOR WITH GYRUS (TURBT-GYRUS) N/A 07/07/2015   Procedure: TRANSURETHRAL RESECTION OF BLADDER TUMOR WITH GYRUS (TURBT-GYRUS);  Surgeon: Cleon Gustin, MD;  Location: [facility];  Service: Urology;  Laterality: N/A;    Patient Care Team: Gildardo Cranker, DO as PCP - General (Internal Medicine)  Social History   Social History  . Marital status: Single    Spouse name: N/A  . Number of children: N/A  . Years of education: N/A   Occupational History  . Retired Automotive engineer    Social History Main Topics  . Smoking status:  Never Smoker  . Smokeless tobacco: Never Used  . Alcohol use No  . Drug use: No  . Sexual activity: No   Other Topics Concern  . Not on file   Social History Narrative    Diet:   Do you drink/eat things with caffeine? Yes coffee   Marital status: Divorced                              What year were you married?    Do you live in a house, apartment, assisted living, condo, trailer, etc)? House   Is it one or more stories? 1 story   How many persons live in your home? 4   Do you have any pets in  your home? 0   Current or past profession: Dietitian   Do you exercise? Yes                                       Type & how often:  Walk daily   Do you have a living will? No   Do you have a DNR Form? No   Do you have a POA/HPOA forms? No     reports that she has never smoked. She has never used smokeless tobacco. She reports that she does not drink alcohol or use drugs.  Family History  Problem Relation Age of Onset  . CAD Mother 36  . Heart disease Father 70  . CAD Brother 37   Family Status  Relation Status  . Mother Deceased  . Father Deceased  . Sister Alive  . Brother Alive  . Daughter Alive  . Son Alive  . Sister Alive  . Sister Alive  . Brother Deceased  . Brother Deceased  . Brother Deceased  . Brother Deceased  . Brother Deceased     No Known Allergies  Medications: Patient's Medications  New Prescriptions   No medications on file  Previous Medications   AMLODIPINE (NORVASC) 10 MG TABLET    Take 1 tablet (10 mg total) by mouth daily.   ASPIRIN 81 MG CHEWABLE TABLET    Chew 81 mg by mouth daily.   ATORVASTATIN (LIPITOR) 80 MG TABLET    Take 1 tablet (80 mg total) by mouth daily at 6 PM.   NITROGLYCERIN (NITROSTAT) 0.4 MG SL TABLET    Place 1 tablet (0.4 mg total) under the tongue every 5 (five) minutes x 3 doses as needed for chest pain.   RIVAROXABAN (XARELTO) 20 MG TABS TABLET    Take 1 tablet (20 mg total) by mouth daily with supper.   ROPINIROLE (REQUIP) 0.25 MG TABLET    Take 1 tablet (0.25 mg total) by mouth at bedtime.  Modified Medications   No medications on file  Discontinued Medications   No medications on file    Review of Systems  Eyes: Positive for visual disturbance (blurry but not new).  All other systems reviewed and are negative.   Vitals:   05/21/16 1041  BP: 140/84  Pulse: 71  Temp: 98.3 F (36.8 C)  TempSrc: Oral  SpO2: 98%  Weight: 167 lb (75.8 kg)  Height: _0  (1.6 m)   Body mass index is 29.58 kg/m.  Physical  Exam  Constitutional: She is oriented to person, place, and time. She appears well-developed and well-nourished. No distress.  HENT:  Nose:    Mouth/Throat: Oropharynx is clear and moist. No oropharyngeal exudate.  Eyes: Pupils are equal, round, and reactive to light. No scleral icterus.    Neck: Neck supple.  Cardiovascular: Normal rate, regular rhythm and intact distal pulses.  Exam reveals no gallop and no friction rub.   Murmur (1/6 SEM) heard. No LE edema b/l. No calf TTP. No palpable cords  Pulmonary/Chest: Effort normal and breath sounds normal. No respiratory distress. She has no wheezes. She has no rales. She exhibits no tenderness.  Abdominal: Soft. Bowel sounds are normal. She exhibits no distension and no mass. There is no tenderness (no CVAT or suprapubic TTP). There is no rebound and no guarding.  No pulsatile mass or bruit noted  Musculoskeletal: She exhibits edema.  Lymphadenopathy:    She has no cervical adenopathy.  Neurological: She is alert and oriented to person, place, and time.  Skin: Skin is warm and dry. No rash noted.  Psychiatric: She has a normal mood and affect. Her behavior is normal. Thought content normal.     Labs reviewed: Office Visit on 03/17/2016  Component Date Value Ref Range Status  . WBC 03/17/2016 4.2  3.8 - 10.8 K/uL Final  . RBC 03/17/2016 3.74* 3.80 - 5.10 MIL/uL Final  . Hemoglobin 03/17/2016 8.3* 11.7 - 15.5 g/dL Final  . HCT 03/17/2016 28.1* 35.0 - 45.0 % Final  . MCV 03/17/2016 75.1* 80.0 - 100.0 fL Final  . MCH 03/17/2016 22.2* 27.0 - 33.0 pg Final  . MCHC 03/17/2016 29.5* 32.0 - 36.0 g/dL Final  . RDW 03/17/2016 19.7* 11.0 - 15.0 % Final  . Platelets 03/17/2016 391  140 - 400 K/uL Final  . MPV 03/17/2016 9.1  7.5 - 12.5 fL Final  . Neutro Abs 03/17/2016 2268  1,500 - 7,800 cells/uL Final  . Lymphs Abs 03/17/2016 1260  850 - 3,900 cells/uL Final  . Monocytes Absolute 03/17/2016 462  200 - 950 cells/uL Final  . Eosinophils  Absolute 03/17/2016 168  15 - 500 cells/uL Final  . Basophils Absolute 03/17/2016 42  0 - 200 cells/uL Final  . Neutrophils Relative % 03/17/2016 54  % Final  . Lymphocytes Relative 03/17/2016 30  % Final  . Monocytes Relative 03/17/2016 11  % Final  . Eosinophils Relative 03/17/2016 4  % Final  . Basophils Relative 03/17/2016 1  % Final  . Smear Review 03/17/2016 Criteria for review not met   Final  . Sodium 03/17/2016 138  135 - 146 mmol/L Final  . Potassium 03/17/2016 4.0  3.5 - 5.3 mmol/L Final  . Chloride 03/17/2016 105  98 - 110 mmol/L Final  . CO2 03/17/2016 25  20 - 31 mmol/L Final  . Glucose, Bld 03/17/2016 113* 65 - 99 mg/dL Final  . BUN 03/17/2016 18  7 - 25 mg/dL Final  . Creat 03/17/2016 0.75  0.60 - 0.93 mg/dL Final   Comment:   For patients > or = 75 years of age: The upper reference limit for Creatinine is approximately 13% higher for people identified as African-American.     . Total Bilirubin 03/17/2016 0.4  0.2 - 1.2 mg/dL Final  . Alkaline Phosphatase 03/17/2016 136* 33 - 130 U/L Final  . AST 03/17/2016 23  10 - 35 U/L Final  . ALT 03/17/2016 17  6 - 29 U/L Final  . Total Protein 03/17/2016 7.2  6.1 - 8.1 g/dL Final  . Albumin 03/17/2016 3.6  3.6 - 5.1 g/dL Final  . Calcium  03/17/2016 9.0  8.6 - 10.4 mg/dL Final  . GFR, Est African American 03/17/2016 >89  >=60 mL/min Final  . GFR, Est Non African American 03/17/2016 79  >=60 mL/min Final    No results found.   Assessment/Plan   ICD-9-CM ICD-10-CM   1. Contusion of left periocular region, subsequent encounter V58.89 S00.12XD    921.1    2. RLS (restless legs syndrome) 333.94 G25.81   3. Essential hypertension 401.9 I10 BMP with eGFR  4. Saddle embolus of pulmonary artery without acute cor pulmonale, unspecified chronicity (HCC) 415.13 I26.92   5. Iron deficiency anemia, unspecified iron deficiency anemia type 280.9 D50.9 CBC with Differential/Platelets  6. High risk medication use V58.69 Z79.899 BMP with  eGFR     ALT  7. Mixed hyperlipidemia 272.2 E78.2 Lipid Panel  8. Left-sided epistaxis 784.7 R04.0      Apply cool compress 2 times daily x 15 minutes each time to left face/eye to reduce swelling x 5 days  Use saline nasal spray as needed to nose to keep nose moist  Continue current medications as ordered  Follow up with specialists as scheduled  Will call with lab results  Follow up in 4 mos for RLS and anemia  Chancy Claros S. Perlie Gold  [facility] [address] [place], [place] [address] [phone] Cell (Monday-Friday 8 AM - 5 PM) [phone] After 5 PM and follow prompts

## 2016-05-24 ENCOUNTER — Other Ambulatory Visit: Payer: Self-pay

## 2016-05-24 ENCOUNTER — Encounter: Payer: Self-pay | Admitting: *Deleted

## 2016-05-24 DIAGNOSIS — D509 Iron deficiency anemia, unspecified: Secondary | ICD-10-CM

## 2016-05-26 ENCOUNTER — Ambulatory Visit (INDEPENDENT_AMBULATORY_CARE_PROVIDER_SITE_OTHER): Payer: PPO | Admitting: Cardiovascular Disease

## 2016-05-26 ENCOUNTER — Encounter: Payer: Self-pay | Admitting: Cardiovascular Disease

## 2016-05-26 VITALS — BP 135/77 | HR 72 | Ht 63.0 in | Wt 163.8 lb

## 2016-05-26 DIAGNOSIS — I2510 Atherosclerotic heart disease of native coronary artery without angina pectoris: Secondary | ICD-10-CM | POA: Diagnosis not present

## 2016-05-26 DIAGNOSIS — Z7901 Long term (current) use of anticoagulants: Secondary | ICD-10-CM

## 2016-05-26 DIAGNOSIS — I2729 Other secondary pulmonary hypertension: Secondary | ICD-10-CM | POA: Diagnosis not present

## 2016-05-26 DIAGNOSIS — D509 Iron deficiency anemia, unspecified: Secondary | ICD-10-CM | POA: Diagnosis not present

## 2016-05-26 DIAGNOSIS — I2699 Other pulmonary embolism without acute cor pulmonale: Secondary | ICD-10-CM

## 2016-05-26 DIAGNOSIS — I10 Essential (primary) hypertension: Secondary | ICD-10-CM | POA: Diagnosis not present

## 2016-05-26 NOTE — Progress Notes (Signed)
Cardiology Office Note    Date:  05/28/2016   ID:  Catherine Snyder, DOB [birth year], MRN [id number]  PCP:  Gildardo Cranker, DO  Cardiologist:  Shelva Majestic, MD   Chief Complaint  Patient presents with  . Follow-up    3 months;   Initial office f/u evaluation with me  History of Present Illness:  Catherine Snyder is a 76 y.o. female Nicholes Rough history of bladder cancer, status post surgery and radiation in April 2017 who was admitted to [facility] hospital in November 2017 with increasing dyspnea on exertion over several weeks duration.  She had mild elevation in troponin.  Cardiac catheterization demonstrated low normal to mild LV dysfunction with an EF of 50%.  There was LVH and subtle mild mid anterolateral hypocontractility.  There was mild nonobstructive CAD with 40% LAD stenosis with calcification.  Her circumflex and RCA were normal.  Right heart pressures were significantly elevated with a PA systolic pressure at 55.  An echo Doppler study suggested RV strain and pulmonary hypertension.  His VQ scan was positive for PE.  On 02/07/2016.  She underwent bilateral pulmonary artery ultrasound dilated after directed thrombo-lysis by interventional radiology.  Guarded transfusion for post intervention of bleeding around the sheath but ultimately stabilize.  Socially, she has been on Xarelto for anticoagulation.  She is breathing much better.  She denies any episodes of chest pressure.  She presents for evaluation.  Past Medical History:  Diagnosis Date  . Bladder cancer ([place])    a. s/p resection in 06/2015  . Dysuria   . Frequency of urination   . Hematuria   . Hypertension   . Nocturia   . Wears glasses   . Wears partial dentures    upper    Past Surgical History:  Procedure Laterality Date  . CARDIAC CATHETERIZATION N/A 02/04/2016   Procedure: RIGHT/LEFT HEART CATH AND CORONARY ANGIOGRAPHY;  Surgeon: Troy Sine, MD;  Location: [facility];  Service: Cardiovascular;   Laterality: N/A;  . CYSTOSCOPY W/ RETROGRADES Bilateral 07/07/2015   Procedure: CYSTOSCOPY WITH RETROGRADE PYELOGRAM;  Surgeon: Cleon Gustin, MD;  Location: [facility];  Service: Urology;  Laterality: Bilateral;  . IR GENERIC HISTORICAL  02/07/2016   IR ANGIOGRAM SELECTIVE EACH ADDITIONAL VESSEL 02/07/2016 Jacqulynn Cadet, MD [facility]  . IR GENERIC HISTORICAL  02/07/2016   IR US GUIDE VASC ACCESS RIGHT 02/07/2016 Jacqulynn Cadet, MD [facility]  . IR GENERIC HISTORICAL  02/07/2016   IR ANGIOGRAM PULMONARY BILATERAL SELECTIVE 02/07/2016 Jacqulynn Cadet, MD [facility]  . IR GENERIC HISTORICAL  02/07/2016   IR INFUSION THROMBOL ARTERIAL INITIAL (MS) 02/07/2016 Jacqulynn Cadet, MD [facility]  . IR GENERIC HISTORICAL  02/07/2016   IR INFUSION THROMBOL ARTERIAL INITIAL (MS) 02/07/2016 Jacqulynn Cadet, MD [facility]  . IR GENERIC HISTORICAL  02/07/2016   IR ANGIOGRAM SELECTIVE EACH ADDITIONAL VESSEL 02/07/2016 Jacqulynn Cadet, MD [facility]  . NO PAST SURGERIES    . TRANSURETHRAL RESECTION OF BLADDER TUMOR WITH GYRUS (TURBT-GYRUS) N/A 07/07/2015   Procedure: TRANSURETHRAL RESECTION OF BLADDER TUMOR WITH GYRUS (TURBT-GYRUS);  Surgeon: Cleon Gustin, MD;  Location: [facility];  Service: Urology;  Laterality: N/A;    Current Medications: Outpatient Medications Prior to Visit  Medication Sig Dispense Refill  . amLODipine (NORVASC) 10 MG tablet Take 1 tablet (10 mg total) by mouth daily. 90 tablet 1  . atorvastatin (LIPITOR) 80 MG tablet Take 1 tablet (80 mg total) by mouth daily  at 6 PM. 90 tablet 1  . nitroGLYCERIN (NITROSTAT) 0.4 MG SL tablet Place 1 tablet (0.4 mg total) under the tongue every 5 (five) minutes x 3 doses as needed for chest pain. 30 tablet 0  . rivaroxaban (XARELTO) 20 MG TABS tablet Take 1 tablet (20 mg total) by mouth daily with supper. 30 tablet 6  . rOPINIRole (REQUIP) 0.25 MG tablet Take 1 tablet (0.25 mg total)  by mouth at bedtime. 30 tablet 3  . aspirin 81 MG chewable tablet Chew 81 mg by mouth daily.     No facility-administered medications prior to visit.      Allergies:   Patient has no known allergies.   Social History   Social History  . Marital status: Single    Spouse name: N/A  . Number of children: N/A  . Years of education: N/A   Occupational History  . Retired Automotive engineer    Social History Main Topics  . Smoking status: Never Smoker  . Smokeless tobacco: Never Used  . Alcohol use No  . Drug use: No  . Sexual activity: No   Other Topics Concern  . None   Social History Narrative    Diet:   Do you drink/eat things with caffeine? Yes coffee   Marital status: Divorced                              What year were you married?    Do you live in a house, apartment, assisted living, condo, trailer, etc)? House   Is it one or more stories? 1 story   How many persons live in your home? 4   Do you have any pets in your home? 0   Current or past profession: Dietitian   Do you exercise? Yes                                       Type & how often:  Walk daily   Do you have a living will? No   Do you have a DNR Form? No   Do you have a POA/HPOA forms? No     Family History:  The patient's family history includes CAD (age of onset: 19) in her brother; CAD (age of onset: 6) in her mother; Heart disease (age of onset: 40) in her father.   ROS General: Negative; No fevers, chills, or night sweats;  HEENT: Negative; No changes in vision or hearing, sinus congestion, difficulty swallowing Pulmonary: Negative; No cough, wheezing, shortness of breath, hemoptysis Cardiovascular: Negative; No chest pain, presyncope, syncope, palpitations GI: Negative; No nausea, vomiting, diarrhea, or abdominal pain GU: Negative; No dysuria, hematuria, or difficulty voiding Musculoskeletal: Negative; no myalgias, joint pain, or weakness Hematologic/Oncology: Negative; no easy bruising,  bleeding Endocrine: Negative; no heat/cold intolerance; no diabetes Neuro: Negative; no changes in balance, headaches Skin: Negative; No rashes or skin lesions Psychiatric: Negative; No behavioral problems, depression Sleep: Negative; No snoring, daytime sleepiness, hypersomnolence, bruxism, restless legs, hypnogognic hallucinations, no cataplexy Other comprehensive 14 point system review is negative.   PHYSICAL EXAM:   VS:  BP 135/77   Pulse 72   Ht '5\' 3"'  (1.6 m)   Wt 163 lb 12.8 oz (74.3 kg)   BMI 29.02 kg/m    Wt Readings from Last 3 Encounters:  05/26/16 163 lb 12.8 oz (74.3 kg)  05/21/16 167 lb (75.8 kg)  05/14/16 164 lb 6.4 oz (74.6 kg)    General: Alert, oriented, no distress.  Skin: normal turgor, no rashes, warm and dry HEENT: Normocephalic, atraumatic. Pupils equal round and reactive to light; sclera anicteric; extraocular muscles intact; Fundi Disks flat, no hemorrhages or exudates Nose without nasal septal hypertrophy Mouth/Parynx benign; Mallinpatti scale Neck: No JVD, no carotid bruits; normal carotid upstroke Lungs: clear to ausculatation and percussion; no wheezing or rales Chest wall: without tenderness to palpitation Heart: PMI not displaced, RRR, s1 s2 normal, 1/6 systolic murmur, no diastolic murmur, no rubs, gallops, thrills, or heaves Abdomen: soft, nontender; no hepatosplenomehaly, BS+; abdominal aorta nontender and not dilated by palpation. Back: no CVA tenderness Pulses 2+ Musculoskeletal: full range of motion, normal strength, no joint deformities Extremities: no clubbing cyanosis or edema, Homan's sign negative  Neurologic: grossly nonfocal; Cranial nerves grossly wnl Psychologic: Normal mood and affect   Studies/Labs Reviewed:   EKG:  EKG is ordered today.  ECG (independently read by me): Normal sinus rhythm at 72 bpm.  Isolated PVC.  QTc interval 43 ms.  Recent Labs: BMP Latest Ref Rng & Units 05/21/2016 03/17/2016 02/10/2016  Glucose 65 - 99  mg/dL 99 113(H) 106(H)  BUN 7 - 25 mg/dL '10 18 8  ' Creatinine 0.60 - 0.93 mg/dL 0.66 0.75 0.64  BUN/Creat Ratio 11 - 26 - - -  Sodium 135 - 146 mmol/L 141 138 139  Potassium 3.5 - 5.3 mmol/L 3.8 4.0 3.4(L)  Chloride 98 - 110 mmol/L 108 105 111  CO2 20 - 31 mmol/L 25 25 18(L)  Calcium 8.6 - 10.4 mg/dL 8.8 9.0 8.9     Hepatic Function Latest Ref Rng & Units 05/21/2016 03/17/2016 11/27/2014  Total Protein 6.1 - 8.1 g/dL - 7.2 7.3  Albumin 3.6 - 5.1 g/dL - 3.6 4.1  AST 10 - 35 U/L - 23 24  ALT 6 - 29 U/L '16 17 16  ' Alk Phosphatase 33 - 130 U/L - 136(H) 110  Total Bilirubin 0.2 - 1.2 mg/dL - 0.4 0.5    CBC Latest Ref Rng & Units 05/21/2016 03/17/2016 02/13/2016  WBC 3.8 - 10.8 K/uL 4.3 4.2 4.8  Hemoglobin 11.7 - 15.5 g/dL 7.7(L) 8.3(L) 8.4(L)  Hematocrit 35.0 - 45.0 % 26.1(L) 28.1(L) 27.5(L)  Platelets 140 - 400 K/uL 431(H) 391 414(H)   Lab Results  Component Value Date   MCV 71.1 (L) 05/21/2016   MCV 75.1 (L) 03/17/2016   MCV 75.8 (L) 02/13/2016   Lab Results  Component Value Date   TSH 1.87 02/03/2016   No results found for: HGBA1C   BNP    Component Value Date/Time   BNP 477.2 (H) 02/03/2016 1619    ProBNP No results found for: PROBNP   Lipid Panel     Component Value Date/Time   CHOL 83 05/21/2016 1115   CHOL 182 11/27/2014 1018   TRIG 45 05/21/2016 1115   HDL 43 (L) 05/21/2016 1115   HDL 43 11/27/2014 1018   CHOLHDL 1.9 05/21/2016 1115   VLDL 9 05/21/2016 1115   LDLCALC 31 05/21/2016 1115   LDLCALC 113 (H) 11/27/2014 1018     RADIOLOGY: No results found.   Additional studies/ records that were reviewed today include:  Hospitalizations, echo Doppler data, laboratory.    ASSESSMENT:    1. Pulmonary hypertension due to thromboembolism ([place])   2. Essential hypertension   3. CAD in native artery   4. Chronic anticoagulation   5. Iron  deficiency anemia, unspecified iron deficiency anemia type      PLAN:  Ms. Gengler is a-year-old female who has a  remote history of bladder CA and is status post radiation and surgery.  She developed increasing shortness of breath and was found to have significant pulmonary hypertension and RV strain.  Catheterization did not reveal significant obstructive CAD but showed 40% LAD stenosis.  She underwent successful pulmonary catheter directed thrombo-lysis with resolution of her PE.  Subtotally, her shortness of breath has significantly improved.  Not having any anginal type symptoms.  She has continued to be on Xarelto now at the 20 mg dose regimen and follow-up of her PE.  Maintenance treatment.  She is on amlodipine 10 mg for hypertension.  She is on atorvastatin 80 mg for hyperlipidemia in attempt to induce plaque regression.  With her previous bleed risk, and without significant obstructive CAD without stents.  I have suggested she discontinue her aspirin therapy.  I will schedule her for follow-up echo Doppler study to reassess pulmonary hypertension and RV strain which had been previously demonstrated.  He has a history of previous iron deficiency anemia with microcytic indices. She will continue her current regimen.  I will see her in 6 months for reevaluation.   Medication Adjustments/Labs and Tests Ordered: Current medicines are reviewed at length with the patient today.  Concerns regarding medicines are outlined above.  Medication changes, Labs and Tests ordered today are listed in the Patient Instructions below. Patient Instructions  Your physician has requested that you have an echocardiogram. Echocardiography is a painless test that uses sound waves to create images of your heart. It provides your doctor with information about the size and shape of your heart and how well your heart's chambers and valves are working. This procedure takes approximately one hour. There are no restrictions for this procedure. This will be done at the [facility].  Your physician has recommended you make the following  change in your medication: STOP the asiprin.  Your physician wants you to follow-up in: 6 months or sooner if needed. You will receive a reminder letter in the mail two months in advance. If you don't receive a letter, please call our office to schedule the follow-up appointment.  If you need a refill on your cardiac medications before your next appointment, please call your pharmacy.       Signed, Shelva Majestic, MD , [facility] 05/28/2016 1:16 PM    [facility] [address] [place], [place], [place]  [address] Phone: [phone]

## 2016-05-26 NOTE — Patient Instructions (Signed)
Your physician has requested that you have an echocardiogram. Echocardiography is a painless test that uses sound waves to create images of your heart. It provides your doctor with information about the size and shape of your heart and how well your heart's chambers and valves are working. This procedure takes approximately one hour. There are no restrictions for this procedure. This will be done at the [facility].  Your physician has recommended you make the following change in your medication: STOP the asiprin.  Your physician wants you to follow-up in: 6 months or sooner if needed. You will receive a reminder letter in the mail two months in advance. If you don't receive a letter, please call our office to schedule the follow-up appointment.  If you need a refill on your cardiac medications before your next appointment, please call your pharmacy.

## 2016-05-31 DIAGNOSIS — H25811 Combined forms of age-related cataract, right eye: Secondary | ICD-10-CM | POA: Diagnosis not present

## 2016-05-31 DIAGNOSIS — H25812 Combined forms of age-related cataract, left eye: Secondary | ICD-10-CM | POA: Diagnosis not present

## 2016-05-31 DIAGNOSIS — H2511 Age-related nuclear cataract, right eye: Secondary | ICD-10-CM | POA: Diagnosis not present

## 2016-05-31 DIAGNOSIS — H04123 Dry eye syndrome of bilateral lacrimal glands: Secondary | ICD-10-CM | POA: Diagnosis not present

## 2016-06-05 ENCOUNTER — Encounter (HOSPITAL_COMMUNITY): Payer: Self-pay | Admitting: Nurse Practitioner

## 2016-06-05 ENCOUNTER — Emergency Department (HOSPITAL_COMMUNITY)
Admission: EM | Admit: 2016-06-05 | Discharge: 2016-06-06 | Disposition: A | Discharge status: Home / Self Care | Payer: PPO | Attending: Emergency Medicine | Admitting: Emergency Medicine

## 2016-06-05 DIAGNOSIS — M79604 Pain in right leg: Secondary | ICD-10-CM

## 2016-06-05 DIAGNOSIS — M79661 Pain in right lower leg: Secondary | ICD-10-CM | POA: Insufficient documentation

## 2016-06-05 DIAGNOSIS — I10 Essential (primary) hypertension: Secondary | ICD-10-CM | POA: Insufficient documentation

## 2016-06-05 DIAGNOSIS — Z8551 Personal history of malignant neoplasm of bladder: Secondary | ICD-10-CM | POA: Insufficient documentation

## 2016-06-05 DIAGNOSIS — I2510 Atherosclerotic heart disease of native coronary artery without angina pectoris: Secondary | ICD-10-CM | POA: Diagnosis not present

## 2016-06-05 DIAGNOSIS — Z7901 Long term (current) use of anticoagulants: Secondary | ICD-10-CM | POA: Diagnosis not present

## 2016-06-05 LAB — BASIC METABOLIC PANEL
Anion gap: 10 (ref 5–15)
BUN: 11 mg/dL (ref 6–20)
CHLORIDE: 104 mmol/L (ref 101–111)
CO2: 25 mmol/L (ref 22–32)
Calcium: 9.2 mg/dL (ref 8.9–10.3)
Creatinine, Ser: 0.77 mg/dL (ref 0.44–1.00)
Glucose, Bld: 100 mg/dL — ABNORMAL HIGH (ref 65–99)
POTASSIUM: 3.7 mmol/L (ref 3.5–5.1)
SODIUM: 139 mmol/L (ref 135–145)

## 2016-06-05 LAB — CBC WITH DIFFERENTIAL/PLATELET
BASOS ABS: 0 10*3/uL (ref 0.0–0.1)
BASOS PCT: 0 %
Eosinophils Absolute: 0 10*3/uL (ref 0.0–0.7)
Eosinophils Relative: 1 %
HCT: 26.3 % — ABNORMAL LOW (ref 36.0–46.0)
Hemoglobin: 7.7 g/dL — ABNORMAL LOW (ref 12.0–15.0)
LYMPHS ABS: 1.5 10*3/uL (ref 0.7–4.0)
Lymphocytes Relative: 31 %
MCH: 20.5 pg — AB (ref 26.0–34.0)
MCHC: 29.3 g/dL — AB (ref 30.0–36.0)
MCV: 70.1 fL — ABNORMAL LOW (ref 78.0–100.0)
MONOS PCT: 9 %
Monocytes Absolute: 0.4 10*3/uL (ref 0.1–1.0)
NEUTROS ABS: 2.9 10*3/uL (ref 1.7–7.7)
Neutrophils Relative %: 59 %
Platelets: 413 10*3/uL — ABNORMAL HIGH (ref 150–400)
RBC: 3.75 MIL/uL — ABNORMAL LOW (ref 3.87–5.11)
RDW: 20.4 % — AB (ref 11.5–15.5)
WBC: 4.8 10*3/uL (ref 4.0–10.5)

## 2016-06-05 LAB — CK: Total CK: 230 U/L (ref 38–234)

## 2016-06-05 MED ORDER — SODIUM CHLORIDE 0.9 % IV BOLUS (SEPSIS)
1000.0000 mL | Freq: Once | INTRAVENOUS | Status: AC
  Administered 2016-06-05: 1000 mL via INTRAVENOUS

## 2016-06-05 NOTE — ED Triage Notes (Signed)
Pt presents with bilateral lower extremity pain. The pain began several months ago but has been more constant and severe this month. She describes the pain as a tightness in the back of her calves, worse on the right side. The pain wakes her up at night, but when she gets out of bed to move around it gets worse. She saw her PCP earlier this month and started on medication for restless leg syndrome with no improvement in her symptoms.

## 2016-06-05 NOTE — ED Notes (Signed)
Pt making frequent trips to the br

## 2016-06-05 NOTE — ED Provider Notes (Signed)
[facility] DEPT Provider Note   CSN: [id number] Arrival date & time: 06/05/16  1804     History   Chief Complaint Chief Complaint  Patient presents with  . Leg Pain    HPI Catherine Snyder is a 76 y.o. female.  HPI Patient presents with concern of ongoing right leg pain. The pain is focally in the right lateral superior calf.  Pain is nonradiating, sore, worse with activity, ambulation, motion. No clear precipitant. Pain has been present for about a week. Patient states that she has seen her physician, but has had no relief with any OTC medication. No distal dysesthesia or weakness. No other new complaints, including no new chest pain, dyspnea. Patient has multiple medical issues, states that she takes all medication as directed including Xarelto.  Past Medical History:  Diagnosis Date  . Bladder cancer ([place])    a. s/p resection in 06/2015  . Dysuria   . Frequency of urination   . Hematuria   . Hypertension   . Nocturia   . Wears glasses   . Wears partial dentures    upper    Patient Active Problem List   Diagnosis Date Noted  . RLS (restless legs syndrome) 05/21/2016  . Iron deficiency anemia 05/21/2016  . High risk medication use 05/21/2016  . Dyslipidemia 03/02/2016  . Chronic anticoagulation 03/02/2016  . Acute saddle pulmonary embolism with acute cor pulmonale (HCC)   . CAD in native artery   . Absolute anemia   . Pulmonary hypertension   . Pulmonary hypertension due to thromboembolism ([place]) 02/05/2016  . Elevated troponin   . Dyspnea on exertion   . Demand ischemia of myocardium [facility]) - Not NSTEMI 02/03/2016  . Essential hypertension 11/27/2014    Past Surgical History:  Procedure Laterality Date  . CARDIAC CATHETERIZATION N/A 02/04/2016   Procedure: RIGHT/LEFT HEART CATH AND CORONARY ANGIOGRAPHY;  Surgeon: Troy Sine, MD;  Location: [facility];  Service: Cardiovascular;  Laterality: N/A;  . CYSTOSCOPY W/ RETROGRADES Bilateral  07/07/2015   Procedure: CYSTOSCOPY WITH RETROGRADE PYELOGRAM;  Surgeon: Cleon Gustin, MD;  Location: [facility];  Service: Urology;  Laterality: Bilateral;  . IR GENERIC HISTORICAL  02/07/2016   IR ANGIOGRAM SELECTIVE EACH ADDITIONAL VESSEL 02/07/2016 Jacqulynn Cadet, MD [facility]  . IR GENERIC HISTORICAL  02/07/2016   IR US GUIDE VASC ACCESS RIGHT 02/07/2016 Jacqulynn Cadet, MD [facility]  . IR GENERIC HISTORICAL  02/07/2016   IR ANGIOGRAM PULMONARY BILATERAL SELECTIVE 02/07/2016 Jacqulynn Cadet, MD [facility]  . IR GENERIC HISTORICAL  02/07/2016   IR INFUSION THROMBOL ARTERIAL INITIAL (MS) 02/07/2016 Jacqulynn Cadet, MD [facility]  . IR GENERIC HISTORICAL  02/07/2016   IR INFUSION THROMBOL ARTERIAL INITIAL (MS) 02/07/2016 Jacqulynn Cadet, MD [facility]  . IR GENERIC HISTORICAL  02/07/2016   IR ANGIOGRAM SELECTIVE EACH ADDITIONAL VESSEL 02/07/2016 Jacqulynn Cadet, MD [facility]  . NO PAST SURGERIES    . TRANSURETHRAL RESECTION OF BLADDER TUMOR WITH GYRUS (TURBT-GYRUS) N/A 07/07/2015   Procedure: TRANSURETHRAL RESECTION OF BLADDER TUMOR WITH GYRUS (TURBT-GYRUS);  Surgeon: Cleon Gustin, MD;  [facility];  Service: Urology;  Laterality: N/A;    OB History    No data available       Home Medications    Prior to Admission medications   Medication Sig Start Date End Date Taking? Authorizing Provider  amLODipine (NORVASC) 10 MG tablet Take 1 tablet (10 mg total) by mouth daily. 03/17/16  Gildardo Cranker, DO  atorvastatin (LIPITOR) 80 MG tablet Take 1 tablet (80 mg total) by mouth daily at 6 PM. 03/17/16   Gildardo Cranker, DO  nitroGLYCERIN (NITROSTAT) 0.4 MG SL tablet Place 1 tablet (0.4 mg total) under the tongue every 5 (five) minutes x 3 doses as needed for chest pain. 02/11/16   Allie Bossier, MD  rivaroxaban (XARELTO) 20 MG TABS tablet Take 1 tablet (20 mg total) by mouth daily with supper. 03/17/16   Gildardo Cranker, DO   rOPINIRole (REQUIP) 0.25 MG tablet Take 1 tablet (0.25 mg total) by mouth at bedtime. 05/14/16   Gildardo Cranker, DO    Family History Family History  Problem Relation Age of Onset  . CAD Mother 79  . Heart disease Father 61  . CAD Brother 80    Social History Social History  Substance Use Topics  . Smoking status: Never Smoker  . Smokeless tobacco: Never Used  . Alcohol use No     Allergies   Patient has no known allergies.   Review of Systems Review of Systems  Constitutional:       Per HPI, otherwise negative  HENT:       Per HPI, otherwise negative  Respiratory:       Per HPI, otherwise negative  Cardiovascular:       Per HPI, otherwise negative  Gastrointestinal: Negative for vomiting.  Endocrine:       Negative aside from HPI  Genitourinary:       Neg aside from HPI   Musculoskeletal:       Per HPI, otherwise negative  Skin: Negative.   Neurological: Negative for syncope.     Physical Exam Updated Vital Signs BP 119/80   Pulse 65   Temp 98.3 F (36.8 C) (Oral)   Resp (!) 22   SpO2 100%   Physical Exam  Constitutional: She is oriented to person, place, and time. She appears well-developed and well-nourished. No distress.  HENT:  Head: Normocephalic and atraumatic.  Eyes: Conjunctivae and EOM are normal.  Cardiovascular: Normal rate, regular rhythm and intact distal pulses.   Pulmonary/Chest: Effort normal. No stridor. No respiratory distress.  Musculoskeletal: She exhibits no edema.  Tenderness to palpation about the area just inferior to the knee on the lateral aspect, with no change in range of motion, no appreciable deformity, no other tenderness to palpation. Right hip, right ankle unremarkable  Neurological: She is alert and oriented to person, place, and time. No cranial nerve deficit.  Skin: Skin is warm and dry.  Psychiatric: She has a normal mood and affect.  Nursing note and vitals reviewed.    ED Treatments / Results  Labs (all  labs ordered are listed, but only abnormal results are displayed) Labs Reviewed  BASIC METABOLIC PANEL - Abnormal; Notable for the following:       Result Value   Glucose, Bld 100 (*)    All other components within normal limits  CBC WITH DIFFERENTIAL/PLATELET - Abnormal; Notable for the following:    RBC 3.75 (*)    Hemoglobin 7.7 (*)    HCT 26.3 (*)    MCV 70.1 (*)    MCH 20.5 (*)    MCHC 29.3 (*)    RDW 20.4 (*)    Platelets 413 (*)    All other components within normal limits  CK    Procedures Procedures (including critical care time)  Medications Ordered in ED Medications  sodium chloride 0.9 % bolus 1,000 mL (  0 mLs Intravenous Stopped 06/05/16 2156)     Initial Impression / Assessment and Plan / ED Course  I have reviewed the triage vital signs and the nursing notes.  Pertinent labs & imaging results that were available during my care of the patient were reviewed by me and considered in my medical decision making (see chart for details).  Elderly female presents with ongoing pain in the right lower extremity. Patient does have multiple medical issues, states that she takes her medication regularly.  Patient does have baseline anemia, takes anticoagulant, but no evidence for substantial hemodynamic stability. No evidence for DVT, and the patient is taking her medications appropriately. Suspicion for musculoskeletal etiology given the reassuring labs, absence of distress, preserved distal neurovascular function. Patient discharged in stable condition with outpatient follow-up.   Final Clinical Impressions(s) / ED Diagnoses  Right lower extremity pain, initial encounter   Carmin Muskrat, MD 06/05/16 2307

## 2016-06-05 NOTE — Discharge Instructions (Signed)
As discussed, your evaluation today has been largely reassuring.  But, it is important that you monitor your condition carefully, and do not hesitate to return to the ED if you develop new, or concerning changes in your condition. ? ?Otherwise, please follow-up with your physician for appropriate ongoing care. ? ?

## 2016-06-10 ENCOUNTER — Other Ambulatory Visit: Payer: Self-pay

## 2016-06-10 ENCOUNTER — Ambulatory Visit (HOSPITAL_COMMUNITY): Payer: PPO | Attending: Cardiovascular Disease

## 2016-06-10 DIAGNOSIS — I351 Nonrheumatic aortic (valve) insufficiency: Secondary | ICD-10-CM | POA: Diagnosis not present

## 2016-06-10 DIAGNOSIS — E785 Hyperlipidemia, unspecified: Secondary | ICD-10-CM | POA: Insufficient documentation

## 2016-06-10 DIAGNOSIS — Z8551 Personal history of malignant neoplasm of bladder: Secondary | ICD-10-CM | POA: Diagnosis not present

## 2016-06-10 DIAGNOSIS — I071 Rheumatic tricuspid insufficiency: Secondary | ICD-10-CM | POA: Insufficient documentation

## 2016-06-10 DIAGNOSIS — I2699 Other pulmonary embolism without acute cor pulmonale: Secondary | ICD-10-CM | POA: Diagnosis not present

## 2016-06-10 DIAGNOSIS — I10 Essential (primary) hypertension: Secondary | ICD-10-CM | POA: Insufficient documentation

## 2016-06-10 DIAGNOSIS — I340 Nonrheumatic mitral (valve) insufficiency: Secondary | ICD-10-CM | POA: Insufficient documentation

## 2016-06-10 DIAGNOSIS — I2729 Other secondary pulmonary hypertension: Secondary | ICD-10-CM | POA: Diagnosis not present

## 2016-06-10 DIAGNOSIS — R0600 Dyspnea, unspecified: Secondary | ICD-10-CM | POA: Diagnosis not present

## 2016-06-10 DIAGNOSIS — Z8249 Family history of ischemic heart disease and other diseases of the circulatory system: Secondary | ICD-10-CM | POA: Diagnosis not present

## 2016-06-10 DIAGNOSIS — I371 Nonrheumatic pulmonary valve insufficiency: Secondary | ICD-10-CM | POA: Insufficient documentation

## 2016-06-16 DIAGNOSIS — H25811 Combined forms of age-related cataract, right eye: Secondary | ICD-10-CM | POA: Diagnosis not present

## 2016-06-16 DIAGNOSIS — H2511 Age-related nuclear cataract, right eye: Secondary | ICD-10-CM | POA: Diagnosis not present

## 2016-06-17 ENCOUNTER — Other Ambulatory Visit: Payer: Self-pay

## 2016-06-17 NOTE — Telephone Encounter (Signed)
Patient requested refill on ropinirole but RX was last filled on 05/14/16 with 3 refills.

## 2016-06-24 ENCOUNTER — Telehealth: Payer: Self-pay | Admitting: *Deleted

## 2016-06-24 ENCOUNTER — Other Ambulatory Visit: Payer: PPO

## 2016-06-24 DIAGNOSIS — D509 Iron deficiency anemia, unspecified: Secondary | ICD-10-CM

## 2016-06-24 LAB — CBC WITH DIFFERENTIAL/PLATELET
BASOS PCT: 0 %
Basophils Absolute: 0 cells/uL (ref 0–200)
EOS ABS: 90 {cells}/uL (ref 15–500)
Eosinophils Relative: 2 %
HCT: 27.5 % — ABNORMAL LOW (ref 35.0–45.0)
Hemoglobin: 8 g/dL — ABNORMAL LOW (ref 11.7–15.5)
LYMPHS PCT: 26 %
Lymphs Abs: 1170 cells/uL (ref 850–3900)
MCH: 20.1 pg — AB (ref 27.0–33.0)
MCHC: 29.1 g/dL — ABNORMAL LOW (ref 32.0–36.0)
MCV: 69.1 fL — AB (ref 80.0–100.0)
MONOS PCT: 9 %
MPV: 9.2 fL (ref 7.5–12.5)
Monocytes Absolute: 405 cells/uL (ref 200–950)
Neutro Abs: 2835 cells/uL (ref 1500–7800)
Neutrophils Relative %: 63 %
PLATELETS: 419 10*3/uL — AB (ref 140–400)
RBC: 3.98 MIL/uL (ref 3.80–5.10)
RDW: 20.9 % — AB (ref 11.0–15.0)
WBC: 4.5 10*3/uL (ref 3.8–10.8)

## 2016-06-24 NOTE — Telephone Encounter (Signed)
Opened in error

## 2016-07-01 ENCOUNTER — Telehealth: Payer: Self-pay | Admitting: *Deleted
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
## 2016-08-09 ENCOUNTER — Other Ambulatory Visit: Payer: Self-pay | Admitting: Internal Medicine

## 2016-08-25 ENCOUNTER — Other Ambulatory Visit: Payer: Self-pay | Admitting: Internal Medicine

## 2016-09-07 ENCOUNTER — Other Ambulatory Visit: Payer: Self-pay | Admitting: Internal Medicine

## 2016-09-07 DIAGNOSIS — G2581 Restless legs syndrome: Secondary | ICD-10-CM

## 2016-09-22 ENCOUNTER — Ambulatory Visit (INDEPENDENT_AMBULATORY_CARE_PROVIDER_SITE_OTHER): Payer: PPO | Admitting: Internal Medicine

## 2016-09-22 ENCOUNTER — Encounter: Payer: Self-pay | Admitting: Internal Medicine

## 2016-09-22 VITALS — BP 132/78 | HR 70 | Temp 98.8°F | Ht 63.0 in | Wt 162.2 lb

## 2016-09-22 DIAGNOSIS — G2581 Restless legs syndrome: Secondary | ICD-10-CM | POA: Diagnosis not present

## 2016-09-22 DIAGNOSIS — Z1211 Encounter for screening for malignant neoplasm of colon: Secondary | ICD-10-CM

## 2016-09-22 DIAGNOSIS — I10 Essential (primary) hypertension: Secondary | ICD-10-CM | POA: Diagnosis not present

## 2016-09-22 DIAGNOSIS — H60391 Other infective otitis externa, right ear: Secondary | ICD-10-CM

## 2016-09-22 DIAGNOSIS — R0602 Shortness of breath: Secondary | ICD-10-CM | POA: Diagnosis not present

## 2016-09-22 DIAGNOSIS — I2692 Saddle embolus of pulmonary artery without acute cor pulmonale: Secondary | ICD-10-CM

## 2016-09-22 DIAGNOSIS — D509 Iron deficiency anemia, unspecified: Secondary | ICD-10-CM | POA: Diagnosis not present

## 2016-09-22 DIAGNOSIS — E782 Mixed hyperlipidemia: Secondary | ICD-10-CM

## 2016-09-22 LAB — CBC WITH DIFFERENTIAL/PLATELET
BASOS PCT: 0 %
Basophils Absolute: 0 cells/uL (ref 0–200)
Eosinophils Absolute: 39 cells/uL (ref 15–500)
Eosinophils Relative: 1 %
HEMATOCRIT: 27.3 % — AB (ref 35.0–45.0)
Hemoglobin: 8 g/dL — ABNORMAL LOW (ref 11.7–15.5)
LYMPHS PCT: 31 %
Lymphs Abs: 1209 cells/uL (ref 850–3900)
MCH: 19.8 pg — ABNORMAL LOW (ref 27.0–33.0)
MCHC: 29.3 g/dL — AB (ref 32.0–36.0)
MCV: 67.6 fL — ABNORMAL LOW (ref 80.0–100.0)
MPV: 9 fL (ref 7.5–12.5)
Monocytes Absolute: 429 cells/uL (ref 200–950)
Monocytes Relative: 11 %
NEUTROS PCT: 57 %
Neutro Abs: 2223 cells/uL (ref 1500–7800)
PLATELETS: 422 10*3/uL — AB (ref 140–400)
RBC: 4.04 MIL/uL (ref 3.80–5.10)
RDW: 19.9 % — ABNORMAL HIGH (ref 11.0–15.0)
WBC: 3.9 10*3/uL (ref 3.8–10.8)

## 2016-09-22 LAB — LIPID PANEL
CHOL/HDL RATIO: 2 ratio (ref <5.0)
CHOLESTEROL: 104 mg/dL (ref <200)
HDL: 51 mg/dL (ref >50)
LDL CALC: 40 mg/dL (ref <100)
Triglycerides: 66 mg/dL (ref <150)
VLDL: 13 mg/dL (ref <30)

## 2016-09-22 LAB — COMPLETE METABOLIC PANEL WITH GFR
ALT: 15 U/L (ref 6–29)
AST: 23 U/L (ref 10–35)
Albumin: 3.8 g/dL (ref 3.6–5.1)
Alkaline Phosphatase: 147 U/L — ABNORMAL HIGH (ref 33–130)
BUN: 12 mg/dL (ref 7–25)
CALCIUM: 9.2 mg/dL (ref 8.6–10.4)
CHLORIDE: 105 mmol/L (ref 98–110)
CO2: 25 mmol/L (ref 20–31)
Creat: 0.7 mg/dL (ref 0.60–0.93)
GFR, Est African American: >89 mL/min (ref >=60)
GFR, Est Non African American: 86 mL/min (ref >=60)
Glucose, Bld: 105 mg/dL — ABNORMAL HIGH (ref 65–99)
POTASSIUM: 3.8 mmol/L (ref 3.5–5.3)
SODIUM: 141 mmol/L (ref 135–146)
Total Bilirubin: 0.5 mg/dL (ref 0.2–1.2)
Total Protein: 7.9 g/dL (ref 6.1–8.1)

## 2016-09-22 MED ORDER — ROPINIROLE HCL 0.5 MG PO TABS: 0.5000 mg | ORAL_TABLET | Freq: Every day | ORAL | 6 refills | Status: DC

## 2016-09-22 MED ORDER — NEOMYCIN-POLYMYXIN-HC 3.5-10000-1 OT SOLN: 4.0000 [drp] | Freq: Three times a day (TID) | OTIC | 2 refills | Status: DC

## 2016-09-22 NOTE — Progress Notes (Signed)
Patient ID: Catherine Snyder, female   DOB: [birth year], 76 y.o.   MRN: [id number]    Location:  [facility] of Service: OFFICE  Chief Complaint  Patient presents with  . Medical Management of Chronic Issues    4 month Follow up    HPI:  76 yo female seen today for f/u. She has occasional SOB with wheezing that improves with resting several minutes.   She has itching right ear intermittently x 1 month. She has used cortisporin otic gtts that helped in the past.  S/p fall - 05/13/16 while walking to bathroom. She tripped over a blanket on the floor and fell forward onto left face. Swelling resolved  RLS - improved but still causes problems on requip 0.89m qhs   HTN - BP controlled on amlodipine. She takes ASA daily  Bladder cancer (dx March 2017) - s/p resection. Prior to dx, She had intermittent hematuria (passing blood clots) at end of urine stream x 1 yr. No dysuria but had increased frequency. Non smoker. Follow[place] urology Dr [place]  CAD - nonobstructive. Cath done 02/04/16. She takes lipitor for hyperlipidemia. She has prn SLNTG. She is on xeralto for anticoagulation. Followed by cardio Dr KClaiborne Billings Anemia - Hgb 8.0. She is s/p 2 units PRBCs. Iron deficient  Hx PE - she had right heart strain with pulmonary HTN. She completed xeralto tx in June 2018 for PE   Past Medical History:  Diagnosis Date  [place]    [place] s/p [place] in [place]2015  . Dysuria   . Frequency of urination   . Hematuria   . Hypertension   . Nocturia   . Wears glasses   . Wears partial dentures    upper    Past Surgical History:  Procedure Laterality Date  . CARDIAC CATHETERIZATION N/A 02/04/2016   Procedure: RIGHT/LEFT HEART CATH AND CORONARY ANGIOGRAPHY;  Surgeon: TTroy Sine MD;  [facility];  Service: Cardiovascular;  Laterality: N/A;  . CYSTOSCOPY W/ RETROGRADES Bilateral 07/07/2015   Procedure: CYSTOSCOPY WITH RETROGRADE  PYELOGRAM;  Surgeon: PCleon Gustin MD;  [facility]  Service: Urology;  Laterality: Bilateral;  . IR GENERIC HISTORICAL  02/07/2016   IR ANGIOGRAM SELECTIVE EACH ADDITIONAL VESSEL 02/07/2016 HJacqulynn Cadet MD [facility]  . IR GENERIC HISTORICAL  02/07/2016   IR [place] ACCESS RIGHT 02/07/2016 HJacqulynn Cadet MD [facility]  . IR GENERIC HISTORICAL  02/07/2016   IR ANGIOGRAM PULMONARY BILATERAL SELECTIVE 02/07/2016 HJacqulynn Cadet MD [facility]  . IR GENERIC HISTORICAL  02/07/2016   IR INFUSION THROMBOL ARTERIAL INITIAL (MS) 02/07/2016 HJacqulynn Cadet MD [facility]  . IR GENERIC HISTORICAL  02/07/2016   IR INFUSION THROMBOL ARTERIAL INITIAL (MS) 02/07/2016 HJacqulynn Cadet MD [facility]  . IR GENERIC HISTORICAL  02/07/2016   IR ANGIOGRAM SELECTIVE EACH ADDITIONAL VESSEL 02/07/2016 HJacqulynn Cadet MD [facility]  . NO PAST SURGERIES    . TRANSURETHRAL RESECTION OF BLADDER TUMOR WITH GYRUS (TURBT-GYRUS) N/A 07/07/2015   Procedure: TRANSURETHRAL RESECTION OF BLADDER TUMOR WITH GYRUS (TURBT-GYRUS);  Surgeon: PCleon Gustin MD;  [facility]  Service: Urology;  Laterality: N/A;    Patient Care Team: CGildardo Cranker DO as PCP - General (Internal Medicine)  Social History   Social History  . Marital status: Single    Spouse name: N/A  . Number of children: N/A  . Years of  education: N/A   Occupational History  . Retired Automotive engineer    Social History Main Topics  . Smoking status: Never Smoker  . Smokeless tobacco: Never Used  . Alcohol use No  . Drug use: No  . Sexual activity: No   Other Topics Concern  . Not on file   Social History Narrative    Diet:   Do you drink/eat things with caffeine? Yes coffee   Marital status: Divorced                              What year were you married?    Do you live in a house, apartment, assisted living, condo, trailer, etc)? House   Is it one or  more stories? 1 story   How many persons live in your home? 4   Do you have any pets in your home? 0   Current or past profession: Dietitian   Do you exercise? Yes                                       Type & how often:  Walk daily   Do you have a living will? No   Do you have a DNR Form? No   Do you have a POA/HPOA forms? No     reports that she has never smoked. She has never used smokeless tobacco. She reports that she does not drink alcohol or use drugs.  Family History  Problem Relation Age of Onset  . CAD Mother 33  . Heart disease Father 56  . CAD Brother 73   Family Status  Relation Status  . Mother Deceased  . Father Deceased  . Sister Alive  . Brother Alive  . Daughter Alive  . Son Alive  . Sister Alive  . Sister Alive  . Brother Deceased  . Brother Deceased  . Brother Deceased  . Brother Deceased  . Brother Deceased     No Known Allergies  Medications: Patient's Medications  New Prescriptions   No medications on file  Previous Medications   AMLODIPINE (NORVASC) 10 MG TABLET    Take 1 tablet (10 mg total) by mouth daily.   ASCORBIC ACID (VITAMIN C) 1000 MG TABLET    Take 1,000 mg by mouth daily.   ATORVASTATIN (LIPITOR) 80 MG TABLET    TAKE 1 TABLET BY MOUTH EVERY DAY AT 6 PM   CHOLECALCIFEROL (VITAMIN D) 2000 UNITS TABLET    Take 2,000 Units by mouth daily.   NITROGLYCERIN (NITROSTAT) 0.4 MG SL TABLET    Place 1 tablet (0.4 mg total) under the tongue every 5 (five) minutes x 3 doses as needed for chest pain.   RIVAROXABAN (XARELTO) 20 MG TABS TABLET    Take 1 tablet (20 mg total) by mouth daily with supper.   ROPINIROLE (REQUIP) 0.25 MG TABLET    TAKE 1 TABLET (0.25 MG TOTAL) BY MOUTH AT BEDTIME.  Modified Medications   No medications on file  Discontinued Medications   No medications on file    Review of Systems  Respiratory: Positive for shortness of breath and wheezing.   All other systems reviewed and are negative.   Vitals:   09/22/16 1106   BP: 132/78  Pulse: 70  Temp: 98.8 F (37.1 C)  TempSrc: Oral  SpO2: 98%  Weight: 162 lb 3.2  oz (73.6 kg)  Height: '5\' 3"'  (1.6 m)   Body mass index is 28.73 kg/m.  Physical Exam  Constitutional: She is oriented to person, place, and time. She appears well-developed and well-nourished. No distress.  HENT:  Mouth/Throat: Oropharynx is clear and moist. No oropharyngeal exudate.  Right external ear canal with patch brown skin but no redness or d/c.  Right TM intact with air fluid level; Left external ear canal nml, left TM intact  Eyes: Pupils are equal, round, and reactive to light. No scleral icterus.  Neck: Neck supple.  Cardiovascular: Normal rate, regular rhythm and intact distal pulses.  Exam reveals no gallop and no friction rub.   Murmur (1/6 SEM) heard. No LE edema b/l. No calf TTP. No palpable cords  Pulmonary/Chest: Effort normal. No respiratory distress. She has wheezes (with cough; prolonged expiratory phase). She has no rales. She exhibits no tenderness.  Abdominal: Soft. Bowel sounds are normal. She exhibits no distension and no mass. There is no tenderness. There is no rebound and no guarding.  No pulsatile mass or bruit noted  Musculoskeletal: She exhibits edema.  Lymphadenopathy:    She has no cervical adenopathy.  Neurological: She is alert and oriented to person, place, and time.  Skin: Skin is warm and dry. No rash noted.  Psychiatric: She has a normal mood and affect. Her behavior is normal. Thought content normal.     Labs reviewed: Appointment on 06/24/2016  Component Date Value Ref Range Status  . WBC 06/24/2016 4.5  3.8 - 10.8 K/uL Final  . RBC 06/24/2016 3.98  3.80 - 5.10 MIL/uL Final   Comment: Elliptocytes Target cells Polychromasia present (1-2/hpf)   . Hemoglobin 06/24/2016 8.0* 11.7 - 15.5 g/dL Final  . HCT 06/24/2016 27.5* 35.0 - 45.0 % Final  . MCV 06/24/2016 69.1* 80.0 - 100.0 fL Final  . MCH 06/24/2016 20.1* 27.0 - 33.0 pg Final  . MCHC  06/24/2016 29.1* 32.0 - 36.0 g/dL Final  . RDW 06/24/2016 20.9* 11.0 - 15.0 % Final  . Platelets 06/24/2016 419* 140 - 400 K/uL Final  . MPV 06/24/2016 9.2  7.5 - 12.5 fL Final  . Neutro Abs 06/24/2016 2835  1,500 - 7,800 cells/uL Final  . Lymphs Abs 06/24/2016 1170  850 - 3,900 cells/uL Final  . Monocytes Absolute 06/24/2016 405  200 - 950 cells/uL Final  . Eosinophils Absolute 06/24/2016 90  15 - 500 cells/uL Final  . Basophils Absolute 06/24/2016 0  0 - 200 cells/uL Final  . Neutrophils Relative % 06/24/2016 63  % Final  . Lymphocytes Relative 06/24/2016 26  % Final  . Monocytes Relative 06/24/2016 9  % Final  . Eosinophils Relative 06/24/2016 2  % Final  . Basophils Relative 06/24/2016 0  % Final  . Smear Review 06/24/2016 Criteria for review not met   Final    No results found.   Assessment/Plan   ICD-10-CM   1. Iron deficiency anemia, unspecified iron deficiency anemia type D50.9 CBC with Differential/Platelets    Iron, TIBC and Ferritin Panel  2. Otitis, externa, infective, right H60.391 neomycin-polymyxin-hydrocortisone (CORTISPORIN) OTIC solution  3. RLS (restless legs syndrome) - failing to change as expected G25.81 rOPINIRole (REQUIP) 0.5 MG tablet  4. Essential hypertension I10 CMP with eGFR  5. Mixed hyperlipidemia E78.2 Lipid Panel  6. Saddle embolus of pulmonary artery without acute cor pulmonale, unspecified chronicity (HCC) I26.92    resolved  7. Shortness of breath R06.02 CBC with Differential/Platelets    CMP with  eGFR    DG Chest 2 View   with wheezing  8. Colon cancer screening Z12.11 Ambulatory referral to Gastroenterology   Increase requip (ropinerole) to 2 tabs (total 0.51m) at bedtime until bottle empty then get new bottle from pharmacy and take 1 tab at bedtime  May consider inhaler use if breathing no better  Continue other medication as ordered  Will call with lab/imaging results  Will call with GI referral  Follow up in 3 mos for RLS, OE,  HTN    Brienna Bass S. CPerlie Gold PMont[facility] Surgery [facility] LLCand Adult Medicine [address] CourtGNeligh Milo [phone] (Monday-Friday 8 AM - 5 PM) ([phone] 5 PM and follow prompts

## 2016-09-22 NOTE — Patient Instructions (Addendum)
Increase requip (ropinerole) to 2 tabs (total 0.5mg ) at bedtime until bottle empty then get new bottle from pharmacy and take 1 tab at bedtime  May consider inhaler use if breathing no better  Continue other medication as ordered  Will call with lab/imaging results  Will call with GI referral  Follow up in 3 mos for RLS, OE, HTN

## 2016-09-23 LAB — IRON,TIBC AND FERRITIN PANEL
%SAT: 4 % — ABNORMAL LOW (ref 11–50)
Ferritin: 6 ng/mL — ABNORMAL LOW (ref 20–288)
IRON: 17 ug/dL — AB (ref 45–160)
TIBC: 451 ug/dL — ABNORMAL HIGH (ref 250–450)

## 2016-09-24 MED ORDER — FERROUS SULFATE 325 (65 FE) MG PO TABS: 325.0000 mg | ORAL_TABLET | Freq: Two times a day (BID) | ORAL | 6 refills | Status: DC

## 2016-09-24 NOTE — Addendum Note (Signed)
Addended by: Moshe Cipro MESHELL A on: 09/24/2016 09:44 AM   Modules accepted: Orders

## 2016-10-05 ENCOUNTER — Encounter (HOSPITAL_COMMUNITY): Payer: Self-pay | Admitting: Family Medicine

## 2016-10-05 ENCOUNTER — Telehealth: Payer: Self-pay

## 2016-10-05 ENCOUNTER — Ambulatory Visit (HOSPITAL_COMMUNITY)
Admission: EM | Admit: 2016-10-05 | Discharge: 2016-10-05 | Disposition: A | Discharge status: Home / Self Care | Payer: PPO | Attending: Family Medicine | Admitting: Family Medicine

## 2016-10-05 DIAGNOSIS — K625 Hemorrhage of anus and rectum: Secondary | ICD-10-CM

## 2016-10-05 NOTE — Telephone Encounter (Signed)
Spoke with patient's daughter, Tomasa Hosteller verbalized understanding of Dr.Carter's recommendations

## 2016-10-05 NOTE — Telephone Encounter (Signed)
Recommend she be seen at [facility] or ED for further eval

## 2016-10-05 NOTE — ED Triage Notes (Signed)
Pt here for one episode of rectal bleeding last night after having a BM. sts bright red and a significant amount in the toilet. Denies any pain. sts that she has had hemmrhoids for a long time.

## 2016-10-05 NOTE — Telephone Encounter (Signed)
No available appointments today.

## 2016-10-05 NOTE — ED Provider Notes (Signed)
  [place]   [id number] 10/05/16 Arrival Time: 1109  ASSESSMENT & PLAN:  1. Rectal bleeding    History of hemorrhoids. Reports normal colonoscopy two years ago. Being that she is taking Xarelto, she agrees to monitor closely. If she bleeds again without stopping she will call 911 or proceed directly to the Emergency Department. No treatment needed today.  Reviewed expectations re: course of current medical issues. Questions answered. Outlined signs and symptoms indicating need for more acute intervention. Patient verbalized understanding. After Visit Summary given.   SUBJECTIVE:  Catherine Snyder is a 76 y.o. female who presents with complaint of rectal bleeding today. Early am after soft BM. Noticed bright red blood on paper and in toilet water. Bleeding lasted for several minutes then slowed down. No active bleeding now. No rectal pain. No abdominal pain. Taking Xarelto.  ROS: As per HPI.   OBJECTIVE:  Vitals:   10/05/16 1130  BP: (!) 152/94  Pulse: 67  Resp: 18  Temp: 98 F (36.7 C)  SpO2: 98%    Vitals stable General appearance: alert; no distress ABD: normal Rectal: full rectal exam not performed as I did not want bleeding to resume; no obvious external source seen Extremities: no cyanosis or edema; symmetrical with no gross deformities Skin: warm and dry  No Known Allergies  PMHx, SurgHx, SocialHx, Medications, and Allergies were reviewed in the Visit Navigator and updated as appropriate.      Vanessa Kick, MD 10/05/16 [phone]

## 2016-10-05 NOTE — Discharge Instructions (Signed)
Today you were diagnosed with the following: 1. Rectal bleeding    Watch closely. If rectal bleeding recurs and you find it is not stopping please call 911 or proceed directly to the Emergency Department.

## 2016-10-05 NOTE — Telephone Encounter (Signed)
Patient's daughter Tomasa Hosteller called on behalf of her mother, patient with concerns of rectal bleeding since yesterday. Bleeding has slowed down, Tomasa Hosteller is not sure if bleeding is bright red or dark red. Patient is on xarelto and iron supplement.  Please advise

## 2016-10-18 ENCOUNTER — Other Ambulatory Visit: Payer: Self-pay | Admitting: Internal Medicine

## 2016-10-18 NOTE — Telephone Encounter (Signed)
She completed med for PE but is still taking it for CAD.

## 2016-10-18 NOTE — Telephone Encounter (Signed)
Would you please confirm if this should this be refilled? I'm confused by last office note. One place said she is still taking it and another said she completed the course. I just wanted to be sure. Thanks!

## 2016-10-19 DIAGNOSIS — C670 Malignant neoplasm of trigone of bladder: Secondary | ICD-10-CM | POA: Diagnosis not present

## 2016-10-19 NOTE — Telephone Encounter (Signed)
Ok. Thanks. Sent in

## 2016-11-29 ENCOUNTER — Encounter: Payer: Self-pay | Admitting: Internal Medicine

## 2016-12-29 ENCOUNTER — Ambulatory Visit (INDEPENDENT_AMBULATORY_CARE_PROVIDER_SITE_OTHER): Payer: PPO | Admitting: Cardiovascular Disease

## 2016-12-29 ENCOUNTER — Encounter: Payer: Self-pay | Admitting: Cardiovascular Disease

## 2016-12-29 VITALS — BP 150/83 | HR 69 | Wt 172.8 lb

## 2016-12-29 DIAGNOSIS — I10 Essential (primary) hypertension: Secondary | ICD-10-CM | POA: Diagnosis not present

## 2016-12-29 DIAGNOSIS — Z7901 Long term (current) use of anticoagulants: Secondary | ICD-10-CM | POA: Diagnosis not present

## 2016-12-29 DIAGNOSIS — I2699 Other pulmonary embolism without acute cor pulmonale: Secondary | ICD-10-CM | POA: Diagnosis not present

## 2016-12-29 DIAGNOSIS — I2729 Other secondary pulmonary hypertension: Secondary | ICD-10-CM | POA: Diagnosis not present

## 2016-12-29 DIAGNOSIS — D509 Iron deficiency anemia, unspecified: Secondary | ICD-10-CM

## 2016-12-29 DIAGNOSIS — E7800 Pure hypercholesterolemia, unspecified: Secondary | ICD-10-CM | POA: Diagnosis not present

## 2016-12-29 DIAGNOSIS — R0609 Other forms of dyspnea: Secondary | ICD-10-CM | POA: Diagnosis not present

## 2016-12-29 NOTE — Patient Instructions (Signed)

## 2016-12-29 NOTE — Progress Notes (Signed)
 Cardiology Office Note    Date:  12/29/2016   ID:  Catherine Snyder, DOB 04/23/1940, MRN [id number]  PCP:  Carter, Monica, DO  Cardiologist:   , MD   No chief complaint on file. Initial office f/u evaluation with me  History of Present Illness:  Catherine Snyder is a 76 y.o. female who has ahistory of bladder cancer, status post surgery and radiation in April 2017 who was admitted to [facility] in November 2017 with increasing dyspnea on exertion over several weeks duration.  She had mild elevation in troponin.  Cardiac catheterization demonstrated low normal to mild LV dysfunction with an EF of 50%.  There was LVH and subtle mild mid anterolateral hypocontractility.  There was mild nonobstructive CAD with 40% LAD stenosis with calcification.  Her circumflex and RCA were normal.  Right heart pressures were significantly elevated with a PA systolic pressure at 55.  An echo Doppler study suggested RV strain and pulmonary hypertension.  His VQ scan was positive for PE.  On 02/07/2016 she underwent successful pulmonary catheter directed thrombo-lysis  by interventional radiology.  She required transfusion for post intervention of bleeding around the sheath but ultimately stabilized. .  She has been on Xarelto for anticoagulation.  She is breathing much better.  She denies any episodes of chest pressure.  I initially saw her for evaluation in March 2018.  She underwent a follow-up echo Doppler study on March 29, which showed normal systolic function with an EF of 55-60% with grade 1 diastolic dysfunction.  There was aortic sclerosis with mild AR, mitral regurgitation that was mild, biatrial enlargement and reportedly although not well described the RV size was normal with function was normal.  There was no mention of pulmonary pressures.  Over the last 6 months, she has felt improved.  However, she continues to experience exertional dyspnea.  She denies chest pressure.  She continues to  take Xarelto.  Her aspirin was stopped by me at her initial office visit.  She also is felt to have an iron deficiency anemia and has been treated by Dr Monica Carter.  She continues to be on atorvastatin 80 mg.  Subsequent blood work was improved with the total cholesterol 104, LDL cholesterol 40.  She presents for reevaluation.  Past Medical History:  Diagnosis Date  . Bladder cancer (HCC)    a. s/p resection in 06/2015  . Dysuria   . Frequency of urination   . Hematuria   . Hypertension   . Nocturia   . Wears glasses   . Wears partial dentures    upper    Past Surgical History:  Procedure Laterality Date  . CARDIAC CATHETERIZATION N/A 02/04/2016   Procedure: RIGHT/LEFT HEART CATH AND CORONARY ANGIOGRAPHY;  Surgeon:  A , MD;  [facility];  Service: Cardiovascular;  Laterality: N/A;  . CYSTOSCOPY W/ RETROGRADES Bilateral 07/07/2015   Procedure: CYSTOSCOPY WITH RETROGRADE PYELOGRAM;  Surgeon: Patrick L McKenzie, MD;  Location: [facility];  Service: Urology;  Laterality: Bilateral;  . IR GENERIC HISTORICAL  02/07/2016   IR ANGIOGRAM SELECTIVE EACH ADDITIONAL VESSEL 02/07/2016 Heath McCullough, MD [facility]  . IR GENERIC HISTORICAL  02/07/2016   IR US GUIDE VASC ACCESS RIGHT 02/07/2016 Heath McCullough, MD [facility]  . IR GENERIC HISTORICAL  02/07/2016   IR ANGIOGRAM PULMONARY BILATERAL SELECTIVE 02/07/2016 Heath McCullough, MD [facility]  . IR GENERIC HISTORICAL  02/07/2016   IR INFUSION THROMBOL ARTERIAL INITIAL (MS) 02/07/2016   Jacqulynn Cadet, MD [facility]  . IR GENERIC HISTORICAL  02/07/2016   IR INFUSION THROMBOL ARTERIAL INITIAL (MS) 02/07/2016 Jacqulynn Cadet, MD [facility]  . IR GENERIC HISTORICAL  02/07/2016   IR ANGIOGRAM SELECTIVE EACH ADDITIONAL VESSEL 02/07/2016 Jacqulynn Cadet, MD [facility]  . NO PAST SURGERIES    . TRANSURETHRAL RESECTION OF BLADDER TUMOR WITH GYRUS (TURBT-GYRUS) N/A 07/07/2015    Procedure: TRANSURETHRAL RESECTION OF BLADDER TUMOR WITH GYRUS (TURBT-GYRUS);  Surgeon: Cleon Gustin, MD;  Location: [facility];  Service: Urology;  Laterality: N/A;    Current Medications: Outpatient Medications Prior to Visit  Medication Sig Dispense Refill  . amLODipine (NORVASC) 10 MG tablet Take 1 tablet (10 mg total) by mouth daily. 90 tablet 1  . Ascorbic Acid (VITAMIN C) 1000 MG tablet Take 1,000 mg by mouth daily.    Marland Kitchen atorvastatin (LIPITOR) 80 MG tablet TAKE 1 TABLET BY MOUTH EVERY DAY AT 6 PM 90 tablet 1  . Cholecalciferol (VITAMIN D) 2000 units tablet Take 2,000 Units by mouth daily.    . ferrous sulfate 325 (65 FE) MG tablet Take 1 tablet (325 mg total) by mouth 2 (two) times daily. 60 tablet 6  . neomycin-polymyxin-hydrocortisone (CORTISPORIN) OTIC solution Place 4 drops into the right ear 3 (three) times daily. Use for 1 week 10 mL 2  . nitroGLYCERIN (NITROSTAT) 0.4 MG SL tablet Place 1 tablet (0.4 mg total) under the tongue every 5 (five) minutes x 3 doses as needed for chest pain. 30 tablet 0  . rOPINIRole (REQUIP) 0.5 MG tablet Take 1 tablet (0.5 mg total) by mouth at bedtime. 30 tablet 6  . XARELTO 20 MG TABS tablet TAKE 1 TABLET EVERY DAY WITH SUPPER 30 tablet 6   No facility-administered medications prior to visit.      Allergies:   Patient has no known allergies.   Social History   Social History  . Marital status: Single    Spouse name: N/A  . Number of children: N/A  . Years of education: N/A   Occupational History  . Retired Automotive engineer    Social History Main Topics  . Smoking status: Never Smoker  . Smokeless tobacco: Never Used  . Alcohol use No  . Drug use: No  . Sexual activity: No   Other Topics Concern  . None   Social History Narrative    Diet:   Do you drink/eat things with caffeine? Yes coffee   Marital status: Divorced                              What year were you married?    Do you live in a house, apartment,  assisted living, condo, trailer, etc)? House   Is it one or more stories? 1 story   How many persons live in your home? 4   Do you have any pets in your home? 0   Current or past profession: Dietitian   Do you exercise? Yes                                       Type & how often:  Walk daily   Do you have a living will? No   Do you have a DNR Form? No   Do you have a POA/HPOA forms? No     Family History:  The patient's family history includes CAD (age of onset: 40) in her brother; CAD (age of onset: 86) in her mother; Heart disease (age of onset: 60) in her father.   ROS General: Negative; No fevers, chills, or night sweats;  HEENT: Negative; No changes in vision or hearing, sinus congestion, difficulty swallowing Pulmonary: Negative; No cough, wheezing, shortness of breath, hemoptysis Cardiovascular: Positive for exertional shortness of breath GI: Negative; No nausea, vomiting, diarrhea, or abdominal pain GU: Negative; No dysuria, hematuria, or difficulty voiding Musculoskeletal: Negative; no myalgias, joint pain, or weakness Hematologic/Oncology: Positive for microcytic anemia Endocrine: Negative; no heat/cold intolerance; no diabetes Neuro: Negative; no changes in balance, headaches Skin: Negative; No rashes or skin lesions Psychiatric: Negative; No behavioral problems, depression Sleep: Negative; No snoring, daytime sleepiness, hypersomnolence, bruxism, restless legs, hypnogognic hallucinations, no cataplexy Other comprehensive 14 point system review is negative.   PHYSICAL EXAM:   VS:  BP (!) 150/83   Pulse 69   Wt 172 lb 12.8 oz (78.4 kg)   BMI 30.61 kg/m      Wt Readings from Last 3 Encounters:  12/29/16 172 lb 12.8 oz (78.4 kg)  09/22/16 162 lb 3.2 oz (73.6 kg)  05/26/16 163 lb 12.8 oz (74.3 kg)    General: Alert, oriented, no distress.  Skin: normal turgor, no rashes, warm and dry HEENT: Normocephalic, atraumatic. Pupils equal round and reactive to light; sclera  anicteric; extraocular muscles intact;  Nose without nasal septal hypertrophy Mouth/Parynx benign; Mallinpatti scale 3 Neck: No JVD, no carotid bruits; normal carotid upstroke Lungs: clear to ausculatation and percussion; no wheezing or rales Chest wall: without tenderness to palpitation Heart: PMI not displaced, RRR, s1 s2 normal, 1/6 systolic murmur, no diastolic murmur, no rubs, gallops, thrills, or heaves Abdomen: soft, nontender; no hepatosplenomehaly, BS+; abdominal aorta nontender and not dilated by palpation. Back: no CVA tenderness Pulses 2+ Musculoskeletal: full range of motion, normal strength, no joint deformities Extremities: no clubbing cyanosis or edema, Homan's sign negative  Neurologic: grossly nonfocal; Cranial nerves grossly wnl Psychologic: Normal mood and affect    Studies/Labs Reviewed:   EKG:  EKG is ordered today. Normal sinus rhythm at 69 bpm with PAC.  QS complex V1 V2.  Normal intervals.  March 2018 ECG (independently read by me): Normal sinus rhythm at 72 bpm.  Isolated PVC.  QTc interval 43 ms.  Recent Labs: BMP Latest Ref Rng & Units 09/22/2016 06/05/2016 05/21/2016  Glucose 65 - 99 mg/dL 105(H) 100(H) 99  BUN 7 - 25 mg/dL 12 11 10  Creatinine 0.60 - 0.93 mg/dL 0.70 0.77 0.66  BUN/Creat Ratio 11 - 26 - - -  Sodium 135 - 146 mmol/L 141 139 141  Potassium 3.5 - 5.3 mmol/L 3.8 3.7 3.8  Chloride 98 - 110 mmol/L 105 104 108  CO2 20 - 31 mmol/L 25 25 25  Calcium 8.6 - 10.4 mg/dL 9.2 9.2 8.8     Hepatic Function Latest Ref Rng & Units 09/22/2016 05/21/2016 03/17/2016  Total Protein 6.1 - 8.1 g/dL 7.9 - 7.2  Albumin 3.6 - 5.1 g/dL 3.8 - 3.6  AST 10 - 35 U/L 23 - 23  ALT 6 - 29 U/L 15 16 17  Alk Phosphatase 33 - 130 U/L 147(H) - 136(H)  Total Bilirubin 0.2 - 1.2 mg/dL 0.5 - 0.4    CBC Latest Ref Rng & Units 09/22/2016 06/24/2016 06/05/2016  WBC 3.8 - 10.8 K/uL 3.9 4.5 4.8  Hemoglobin 11.7 - 15.5 g/dL 8.0(L) 8.0(L) 7.7(L)    Hematocrit 35.0 - 45.0 % 27.3(L)  27.5(L) 26.3(L)  Platelets 140 - 400 K/uL 422(H) 419(H) 413(H)   Lab Results  Component Value Date   MCV 67.6 (L) 09/22/2016   MCV 69.1 (L) 06/24/2016   MCV 70.1 (L) 06/05/2016   Lab Results  Component Value Date   TSH 1.87 02/03/2016   No results found for: HGBA1C   BNP    Component Value Date/Time   BNP 477.2 (H) 02/03/2016 1619    ProBNP No results found for: PROBNP   Lipid Panel     Component Value Date/Time   CHOL 104 09/22/2016 1204   CHOL 182 11/27/2014 1018   TRIG 66 09/22/2016 1204   HDL 51 09/22/2016 1204   HDL 43 11/27/2014 1018   CHOLHDL 2.0 09/22/2016 1204   VLDL 13 09/22/2016 1204   LDLCALC 40 09/22/2016 1204   LDLCALC 113 (H) 11/27/2014 1018     RADIOLOGY: No results found.   Additional studies/ records that were reviewed today include:  Hospitalizations, echo Doppler data, laboratory.    ASSESSMENT:    1. Pulmonary embolism on long-term anticoagulation therapy (HCC)   2. Pulmonary hypertension due to thromboembolism (HCC)   3. Essential hypertension   4. Chronic anticoagulation   5. Iron deficiency anemia, unspecified iron deficiency anemia type   6. DOE (dyspnea on exertion)      PLAN:  Ms. Holshouser is a-75-year-old African-American female year-old who has a remote history of bladder CA and is status post radiation and surgery.  She developed increasing shortness of breath and was found to have significant pulmonary hypertension and RV strain.  Catheterization did not reveal significant obstructive CAD but showed 40% LAD stenosis.  She underwent successful pulmonary catheter directed thrombo-lysis with resolution of her PE.  Shortness of breath, significant improved.  However, she still experiences exertional dyspnea.  When I initially saw her, I discontinued her concomitant aspirin, particularly with potential bleed risk and the fact that her coronaries were without obstructive disease.  She has had issues with iron deficiency anemia for  which she's been placed on iron supplementation.  He follow-up echo Doppler study in March 2018 showed normal systolic function with grade 1 diastolic dysfunction, mild aortic sclerosis with insufficiency, mild mitral regurgitation, biatrial enlargement, and reportedly the RV was not dilated.  There was no mention of pulmonary hypertension.  There are extensive PE, gated by strain requiring significant thrombus lysis.  I have recommended continuation of anticoagulation for at least a year, possibly longer.  I provided her with some samples of Xarelto today since his since she has had difficulty due to cost.  She will be seeing Dr. Monica Carter in follow-up.  I suspect her exertional dyspnea is contributed by her diastolic dysfunction.  Clinically, she appears euvolemic on exam and doesn't not appear to have overt heart failure.  I will see her in the office in 4-6 months for follow-up evaluation.  If she continues to be stable, discussed at that time will take place regarding possible discontinuance of eliquis or substitution.   Medication Adjustments/Labs and Tests Ordered: Current medicines are reviewed at length with the patient today.  Concerns regarding medicines are outlined above.  Medication changes, Labs and Tests ordered today are listed in the Patient Instructions below.  Patient Instructions  Medication Instructions:  Your physician recommends that you continue on your current medications as directed. Please refer to the Current Medication list given to you today.  Follow-Up: Your physician wants you to follow-up   in: 6 months with Dr. .  You will receive a reminder letter in the mail two months in advance. If you don't receive a letter, please call our office to schedule the follow-up appointment.   Any Other Special Instructions Will Be Listed Below (If Applicable).     If you need a refill on your cardiac medications before your next appointment, please call your  pharmacy.     Signed,  , MD , FACC 12/29/2016 2:33 PM    [facility] [address], [address], [place], [place]  [address] Phone: [phone]    

## 2017-01-04 ENCOUNTER — Ambulatory Visit: Payer: PPO | Admitting: Internal Medicine

## 2017-01-20 DIAGNOSIS — C670 Malignant neoplasm of trigone of bladder: Secondary | ICD-10-CM | POA: Diagnosis not present

## 2017-02-15 ENCOUNTER — Other Ambulatory Visit: Payer: Self-pay | Admitting: Internal Medicine

## 2017-02-19 ENCOUNTER — Other Ambulatory Visit: Payer: Self-pay | Admitting: Internal Medicine

## 2017-02-19 DIAGNOSIS — I10 Essential (primary) hypertension: Secondary | ICD-10-CM

## 2017-02-21 NOTE — Telephone Encounter (Signed)
Patient needs appointment for future refills.

## 2017-03-18 ENCOUNTER — Other Ambulatory Visit: Payer: Self-pay | Admitting: Internal Medicine

## 2017-03-18 DIAGNOSIS — G2581 Restless legs syndrome: Secondary | ICD-10-CM

## 2017-03-18 IMAGING — DX DG HIP (WITH OR WITHOUT PELVIS) 2-3V*L*
3 series · 3 of 3 positions shown · non-contrast
Comparison: None.

CLINICAL DATA: Left hip pain 3 days with limited range of motion.
No injury.

EXAM:
DG HIP (WITH OR WITHOUT PELVIS) 2-3V LEFT

[pelvis ap]
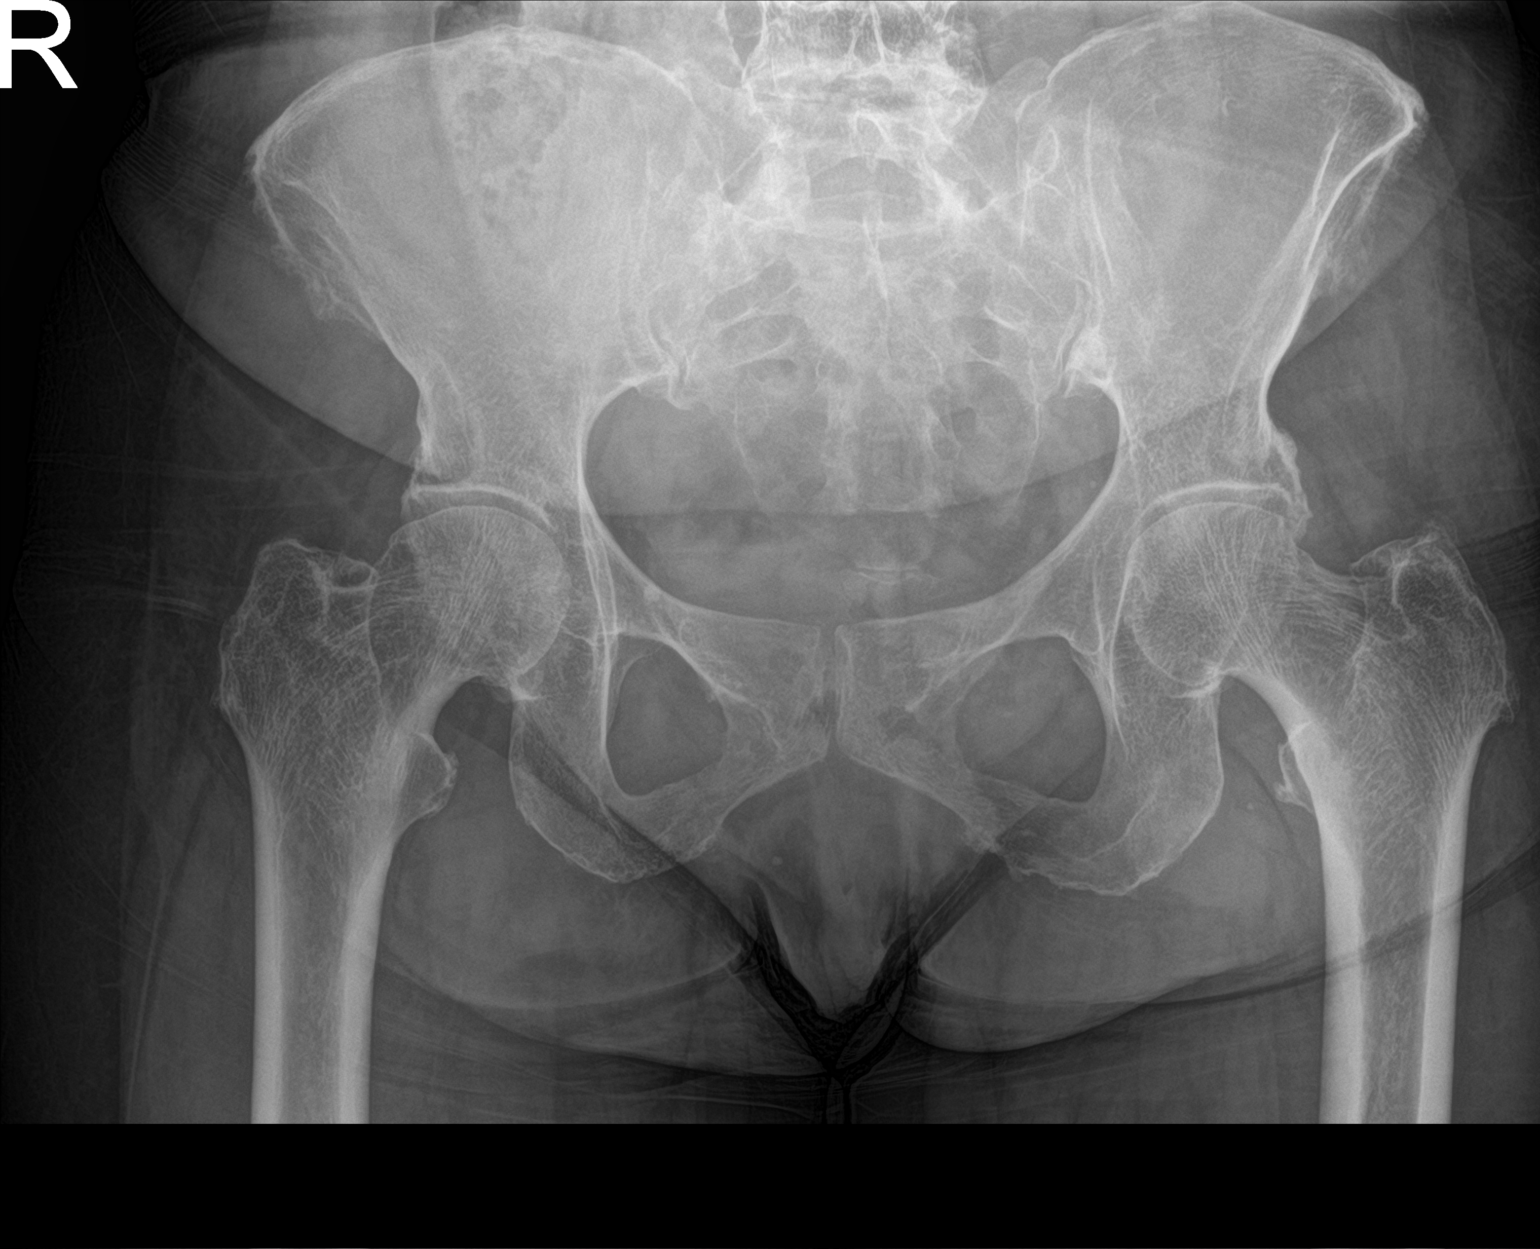

[hip ap]
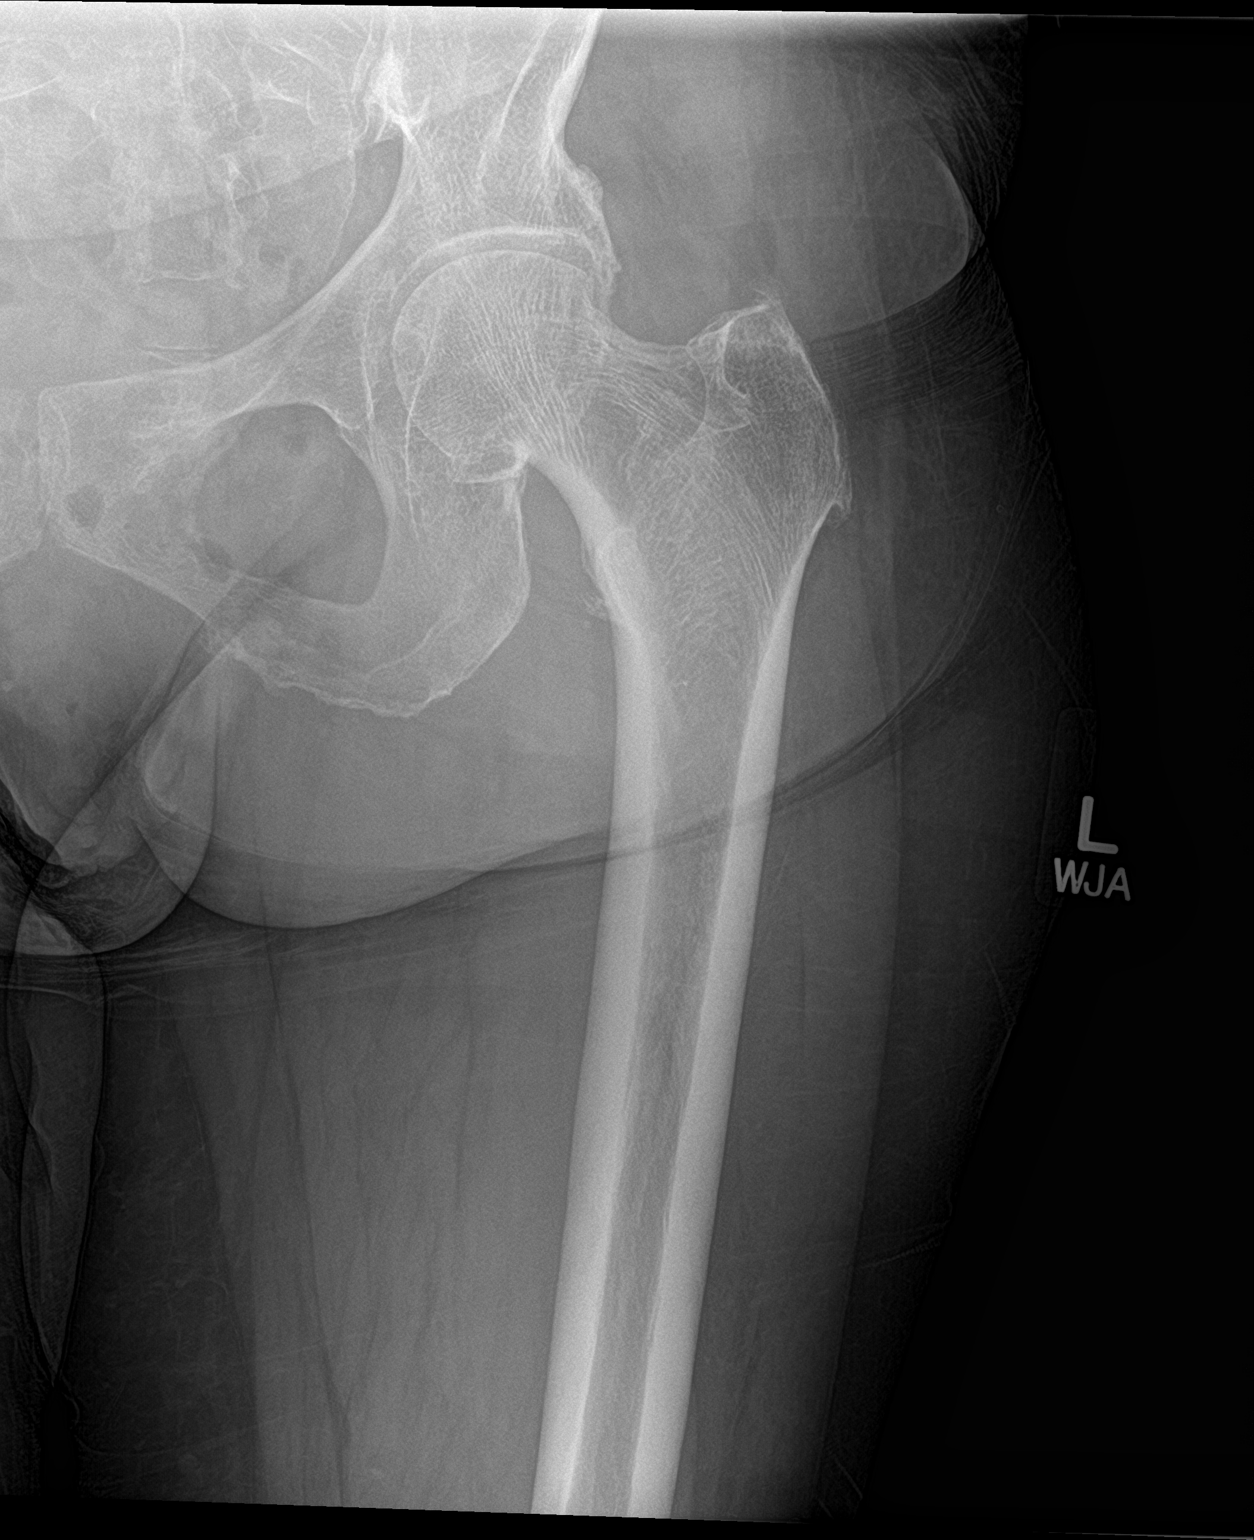

[hip lat]
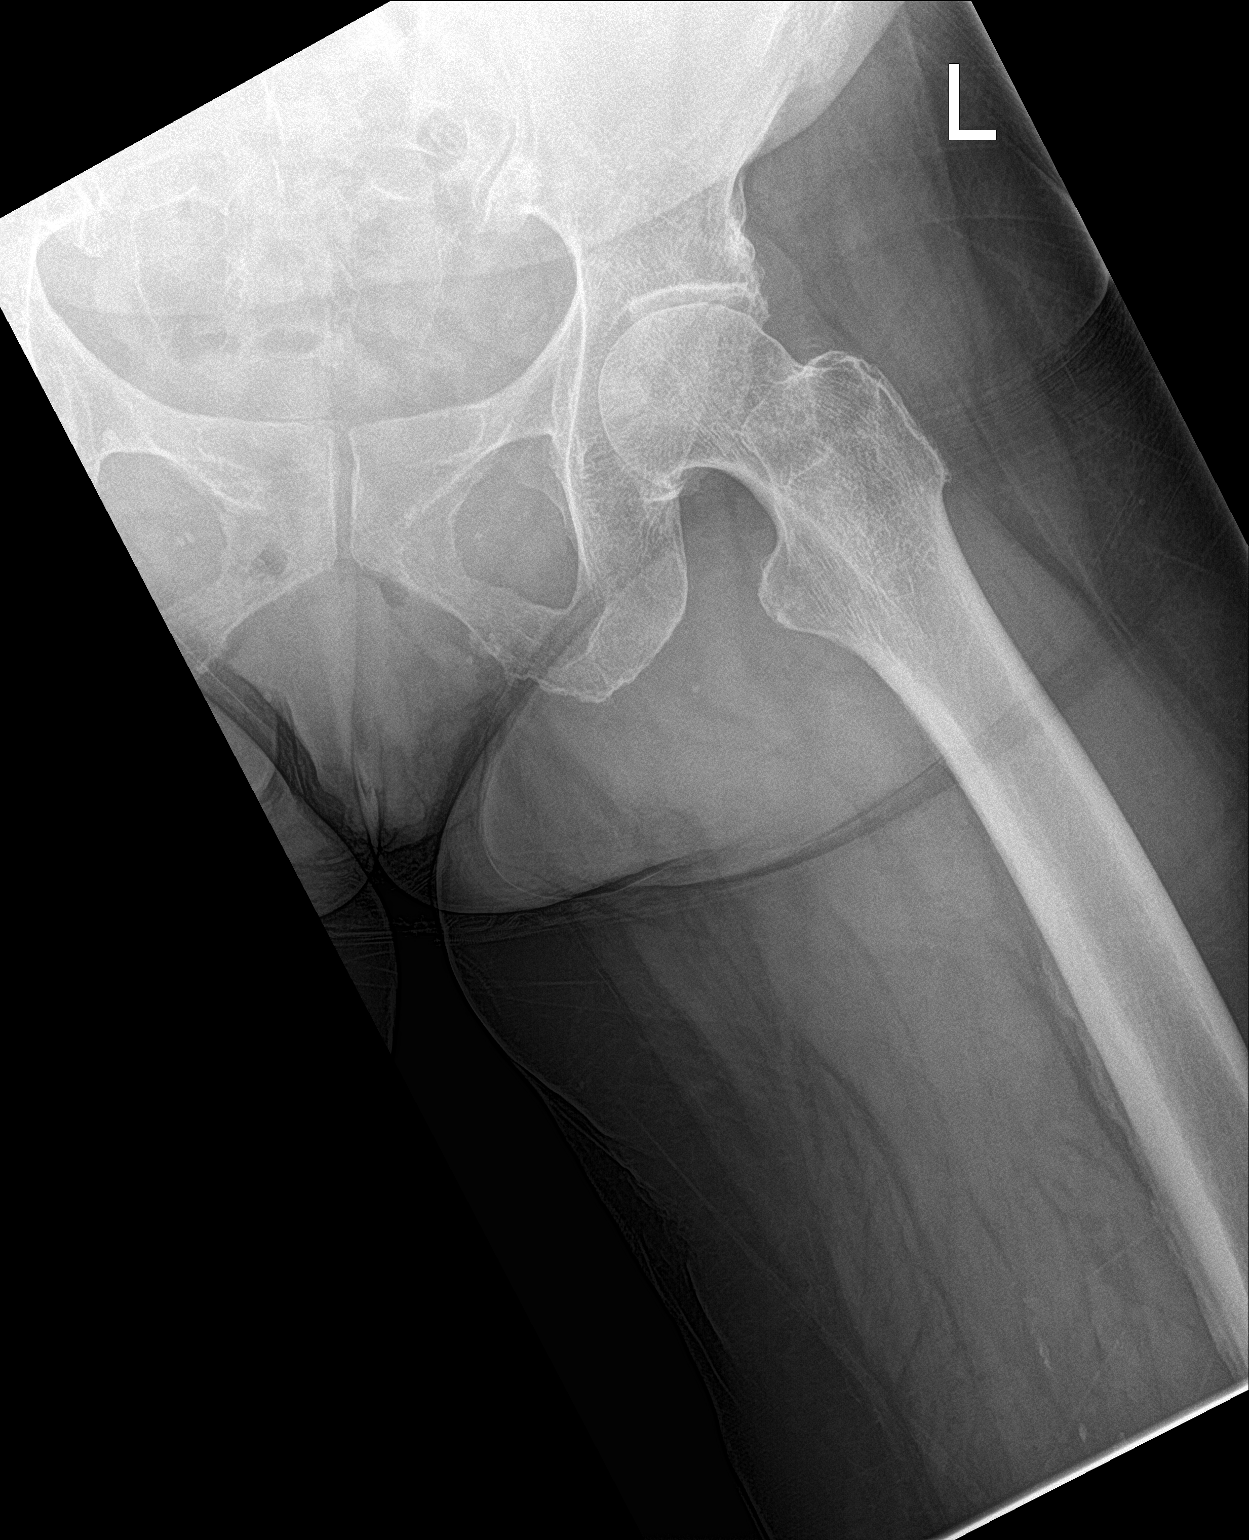

[3 of 3 positions shown; findings below may reference images not displayed]

FINDINGS: There mild symmetric degenerative changes of the hips. No evidence
of acute fracture or dislocation. Degenerative change of the spine
and sacroiliac joints.
IMPRESSION: No acute findings.

Mild degenerative change of the hips.

## 2017-03-29 ENCOUNTER — Ambulatory Visit: Payer: PPO | Admitting: Internal Medicine

## 2017-03-31 ENCOUNTER — Other Ambulatory Visit: Payer: Self-pay | Admitting: Internal Medicine

## 2017-03-31 DIAGNOSIS — I10 Essential (primary) hypertension: Secondary | ICD-10-CM

## 2017-04-12 ENCOUNTER — Encounter: Payer: Self-pay | Admitting: Internal Medicine

## 2017-04-12 ENCOUNTER — Ambulatory Visit (INDEPENDENT_AMBULATORY_CARE_PROVIDER_SITE_OTHER): Payer: PPO | Admitting: Internal Medicine

## 2017-04-12 VITALS — BP 140/78 | HR 61 | Temp 98.1°F | Resp 10 | Ht 63.0 in | Wt 177.0 lb

## 2017-04-12 DIAGNOSIS — E782 Mixed hyperlipidemia: Secondary | ICD-10-CM

## 2017-04-12 DIAGNOSIS — Z79899 Other long term (current) drug therapy: Secondary | ICD-10-CM

## 2017-04-12 DIAGNOSIS — G8929 Other chronic pain: Secondary | ICD-10-CM

## 2017-04-12 DIAGNOSIS — G2581 Restless legs syndrome: Secondary | ICD-10-CM

## 2017-04-12 DIAGNOSIS — M79645 Pain in left finger(s): Secondary | ICD-10-CM | POA: Diagnosis not present

## 2017-04-12 DIAGNOSIS — I10 Essential (primary) hypertension: Secondary | ICD-10-CM | POA: Diagnosis not present

## 2017-04-12 DIAGNOSIS — I2692 Saddle embolus of pulmonary artery without acute cor pulmonale: Secondary | ICD-10-CM

## 2017-04-12 DIAGNOSIS — D509 Iron deficiency anemia, unspecified: Secondary | ICD-10-CM

## 2017-04-12 MED ORDER — DICLOFENAC SODIUM 1 % TD GEL: 2.0000 g | Freq: Four times a day (QID) | TRANSDERMAL | 6 refills | Status: DC

## 2017-04-12 NOTE — Progress Notes (Signed)
Patient ID: Catherine Snyder, female   DOB: 05-06-1940, 77 y.o.   MRN: [id number]   Location:  [facility]  Provider: DR Arletha Grippe  Code Status:  Goals of Care:  Advanced Directives 09/22/2016  Does Patient Have a Medical Advance Directive? Yes  Type of Paramedic of [place];Living will  Does patient want to make changes to medical advance directive? No - Patient declined  Copy of Liborio Negron Torres in Chart? Yes  Would patient like information on creating a medical advance directive? -     Chief Complaint  Patient presents with  . Medical Management of Chronic Issues    3 month follow-up   . Medication Management    Xarelto- too expensive- need samples   . Immunizations    Refused all vaccine updates     HPI: Patient is a 77 y.o. female seen today for medical management of chronic diseases. Weight up 5 lbs since last ov.  Right ear itching stopped after she stopped using q tips.  She has left thumb base very painful and sensation that "air goes through it".  RLS - stable on requip. No swelling or pain   HTN - stable on amlodipine. She takes ASA daily  Bladder cancer (dx March 2017) - s/p resection. Prior to dx, She had intermittent hematuria (passing blood clots) at end of urine stream x 1 yr. No dysuria but had increased frequency. Non smoker. Followed by urology Dr Alyson Ingles  She relocated from [place] in March 2015  CAD - nonobstructive. Cath done 02/04/16. She takes lipitor for hyperlipidemia. She has prn SLNTG. She is on xeralto for anticoagulation. Followed by cardio Dr Claiborne Billings  Anemia - Hgb 8.0. She is s/p 2 units PRBCs. Iron deficient  Hx PE - she had right heart strain with pulmonary HTN. She completed xeralto tx in June 2018 for PE   Past Medical History:  Diagnosis Date  . Bladder cancer ([place])    a. s/p resection in 06/2015  . Dysuria   . Frequency of urination   . Hematuria   . Hypertension   . Nocturia   . Wears  glasses   . Wears partial dentures    upper    Past Surgical History:  Procedure Laterality Date  . CARDIAC CATHETERIZATION N/A 02/04/2016   Procedure: RIGHT/LEFT HEART CATH AND CORONARY ANGIOGRAPHY;  Surgeon: Troy Sine, MD;  Location: [facility];  Service: Cardiovascular;  Laterality: N/A;  . CYSTOSCOPY W/ RETROGRADES Bilateral 07/07/2015   Procedure: CYSTOSCOPY WITH RETROGRADE PYELOGRAM;  Surgeon: Cleon Gustin, MD;  Location: [facility];  Service: Urology;  Laterality: Bilateral;  . IR GENERIC HISTORICAL  02/07/2016   IR ANGIOGRAM SELECTIVE EACH ADDITIONAL VESSEL 02/07/2016 Jacqulynn Cadet, MD [facility]  . IR GENERIC HISTORICAL  02/07/2016   IR US GUIDE VASC ACCESS RIGHT 02/07/2016 Jacqulynn Cadet, MD [facility]  . IR GENERIC HISTORICAL  02/07/2016   IR ANGIOGRAM PULMONARY BILATERAL SELECTIVE 02/07/2016 Jacqulynn Cadet, MD [facility]  . IR GENERIC HISTORICAL  02/07/2016   IR INFUSION THROMBOL ARTERIAL INITIAL (MS) 02/07/2016 Jacqulynn Cadet, MD [facility]  . IR GENERIC HISTORICAL  02/07/2016   IR INFUSION THROMBOL ARTERIAL INITIAL (MS) 02/07/2016 Jacqulynn Cadet, MD [facility]  . IR GENERIC HISTORICAL  02/07/2016   IR ANGIOGRAM SELECTIVE EACH ADDITIONAL VESSEL 02/07/2016 Jacqulynn Cadet, MD [facility]  . NO PAST SURGERIES    . TRANSURETHRAL RESECTION OF BLADDER TUMOR WITH GYRUS (TURBT-GYRUS) N/A  07/07/2015   Procedure: TRANSURETHRAL RESECTION OF BLADDER TUMOR WITH GYRUS (TURBT-GYRUS);  Surgeon: Cleon Gustin, MD;  Location: [facility];  Service: Urology;  Laterality: N/A;     reports that  has never smoked. she has never used smokeless tobacco. She reports that she does not drink alcohol or use drugs. Social History   Socioeconomic History  . Marital status: Single    Spouse name: Not on file  . Number of children: Not on file  . Years of education: Not on file  . Highest education level: Not on file    Social Needs  . Financial resource strain: Not on file  . Food insecurity - worry: Not on file  . Food insecurity - inability: Not on file  . Transportation needs - medical: Not on file  . Transportation needs - non-medical: Not on file  Occupational History  . Occupation: Retired Automotive engineer  Tobacco Use  . Smoking status: Never Smoker  . Smokeless tobacco: Never Used  Substance and Sexual Activity  . Alcohol use: No  . Drug use: No  . Sexual activity: No  Other Topics Concern  . Not on file  Social History Narrative    Diet:   Do you drink/eat things with caffeine? Yes coffee   Marital status: Divorced                              What year were you married?    Do you live in a house, apartment, assisted living, condo, trailer, etc)? House   Is it one or more stories? 1 story   How many persons live in your home? 4   Do you have any pets in your home? 0   Current or past profession: Dietitian   Do you exercise? Yes                                       Type & how often:  Walk daily   Do you have a living will? No   Do you have a DNR Form? No   Do you have a POA/HPOA forms? No    Family History  Problem Relation Age of Onset  . CAD Mother 39  . Heart disease Father 49  . CAD Brother 24    No Known Allergies  Outpatient Encounter Medications as of 04/12/2017  Medication Sig  . amLODipine (NORVASC) 10 MG tablet Take 1 tablet (10 mg total) by mouth daily.  . Ascorbic Acid (VITAMIN C) 1000 MG tablet Take 1,000 mg by mouth daily.  Marland Kitchen atorvastatin (LIPITOR) 80 MG tablet TAKE 1 TABLET BY MOUTH EVERY DAY AT 6 PM  . Cholecalciferol (VITAMIN D) 2000 units tablet Take 2,000 Units by mouth daily.  . ferrous sulfate 325 (65 FE) MG tablet Take 1 tablet (325 mg total) by mouth 2 (two) times daily.  . nitroGLYCERIN (NITROSTAT) 0.4 MG SL tablet Place 1 tablet (0.4 mg total) under the tongue every 5 (five) minutes x 3 doses as needed for chest pain.  Marland Kitchen rOPINIRole (REQUIP) 0.5 MG tablet  TAKE 1 TABLET AT BEDTIME  . XARELTO 20 MG TABS tablet TAKE 1 TABLET EVERY DAY WITH SUPPER  . [DISCONTINUED] amLODipine (NORVASC) 10 MG tablet TAKE 1 TABLET BY MOUTH EVERY DAY  . [DISCONTINUED] neomycin-polymyxin-hydrocortisone (CORTISPORIN) OTIC solution Place 4 drops into the right  ear 3 (three) times daily. Use for 1 week   No facility-administered encounter medications on file as of 04/12/2017.     Review of Systems:  Review of Systems  Musculoskeletal: Positive for arthralgias and joint swelling.  All other systems reviewed and are negative.   Health Maintenance  Topic Date Due  . DEXA SCAN  04/15/2017 (Originally 01/08/2007)  . COLONOSCOPY  04/15/2017 (Originally 01/08/1992)  . PNA vac Low Risk Adult (1 of 2 - PCV13) 04/15/2018 (Originally 01/08/2007)  . INFLUENZA VACCINE  04/16/2019 (Originally 10/13/2016)  . TETANUS/TDAP  04/16/2019 (Originally 01/07/1961)  . MAMMOGRAM  12/10/2017    Physical Exam: Vitals:   04/12/17 1348  BP: 140/78  Pulse: 61  Resp: 10  Temp: 98.1 F (36.7 C)  TempSrc: Oral  SpO2: 98%  Weight: 177 lb (80.3 kg)  Height: 5\' 3"  (1.6 m)   Body mass index is 31.35 kg/m. Physical Exam  Constitutional: She is oriented to person, place, and time. She appears well-developed and well-nourished.  HENT:  Mouth/Throat: Oropharynx is clear and moist. No oropharyngeal exudate.  MMM; no oral thrush  Eyes: Pupils are equal, round, and reactive to light. No scleral icterus.  Neck: Neck supple. Carotid bruit is not present. No tracheal deviation present. No thyromegaly present.  Cardiovascular: Normal rate, regular rhythm and intact distal pulses. Exam reveals no gallop and no friction rub.  Murmur (1/6 SEM) heard. No LE edema b/l. no calf TTP.   Pulmonary/Chest: Effort normal and breath sounds normal. No stridor. No respiratory distress. She has no wheezes. She has no rales.  Abdominal: Soft. Normal appearance and bowel sounds are normal. She exhibits no  distension and no mass. There is no hepatomegaly. There is no tenderness. There is no rigidity, no rebound and no guarding. No hernia.  Musculoskeletal: She exhibits edema and tenderness.  Left thumb base swelling with reduced ROM and TTP, crepitus on ROM; reduced left grip strength; multiple small joint swelling and deformities; thenar eminence atrophy noted  Lymphadenopathy:    She has no cervical adenopathy.  Neurological: She is alert and oriented to person, place, and time. She has normal reflexes.  Skin: Skin is warm and dry. No rash noted.  Psychiatric: She has a normal mood and affect. Her behavior is normal. Judgment and thought content normal.    Labs reviewed: Basic Metabolic Panel: Recent Labs    05/21/16 1115 06/05/16 2030 09/22/16 1204  NA 141 139 141  K 3.8 3.7 3.8  CL 108 104 105  CO2 25 25 25   GLUCOSE 99 100* 105*  BUN 10 11 12   CREATININE 0.66 0.77 0.70  CALCIUM 8.8 9.2 9.2   Liver Function Tests: Recent Labs    05/21/16 1115 09/22/16 1204  AST  --  23  ALT 16 15  ALKPHOS  --  147*  BILITOT  --  0.5  PROT  --  7.9  ALBUMIN  --  3.8   No results for input(s): LIPASE, AMYLASE in the last 8760 hours. No results for input(s): AMMONIA in the last 8760 hours. CBC: Recent Labs    06/05/16 2030 06/24/16 0949 09/22/16 1204  WBC 4.8 4.5 3.9  NEUTROABS 2.9 2,835 2,223  HGB 7.7* 8.0* 8.0*  HCT 26.3* 27.5* 27.3*  MCV 70.1* 69.1* 67.6*  PLT 413* 419* 422*   Lipid Panel: Recent Labs    05/21/16 1115 09/22/16 1204  CHOL 83 104  HDL 43* 51  LDLCALC 31 40  TRIG 45 66  CHOLHDL 1.9  2.0   No results found for: HGBA1C  Procedures since last visit: No results found.  Assessment/Plan     Catherine Snyder S. Perlie Gold  [facility] [address] [place], [place] [address] [phone] Cell (Monday-Friday 8 AM - 5 PM) [phone] After 5 PM and follow prompts

## 2017-04-12 NOTE — Patient Instructions (Addendum)
START VOLTAREN (DICLOFENAC) GEL - APPLY TO LEFT THUMB SWELLING  UP TO 4 TIMES DAILY   Continue other medications as ordered  Will call with lab results  Will call with Ortho results  Follow up with specialists as scheduled  Follow up in 4 mos anemia, hx PE, HTN, RLS.

## 2017-04-13 LAB — CBC
HCT: 36.6 % (ref 35.0–45.0)
Hemoglobin: 12 g/dL (ref 11.7–15.5)
MCH: 27.9 pg (ref 27.0–33.0)
MCHC: 32.8 g/dL (ref 32.0–36.0)
MCV: 85.1 fL (ref 80.0–100.0)
MPV: 10.5 fL (ref 7.5–12.5)
PLATELETS: 267 10*3/uL (ref 140–400)
RBC: 4.3 10*6/uL (ref 3.80–5.10)
RDW: 16.4 % — ABNORMAL HIGH (ref 11.0–15.0)
WBC: 3.3 10*3/uL — ABNORMAL LOW (ref 3.8–10.8)

## 2017-04-13 LAB — COMPLETE METABOLIC PANEL WITH GFR
AG Ratio: 1.1 (calc) (ref 1.0–2.5)
ALT: 18 U/L (ref 6–29)
AST: 19 U/L (ref 10–35)
Albumin: 3.8 g/dL (ref 3.6–5.1)
Alkaline phosphatase (APISO): 133 U/L — ABNORMAL HIGH (ref 33–130)
BUN: 9 mg/dL (ref 7–25)
CALCIUM: 9.1 mg/dL (ref 8.6–10.4)
CHLORIDE: 106 mmol/L (ref 98–110)
CO2: 27 mmol/L (ref 20–32)
CREATININE: 0.63 mg/dL (ref 0.60–0.93)
GFR, EST NON AFRICAN AMERICAN: 88 mL/min/{1.73_m2} (ref >=60)
GFR, Est African American: 102 mL/min/{1.73_m2} (ref >=60)
GLUCOSE: 102 mg/dL (ref 65–139)
Globulin: 3.4 g/dL (calc) (ref 1.9–3.7)
Potassium: 4 mmol/L (ref 3.5–5.3)
Sodium: 139 mmol/L (ref 135–146)
TOTAL PROTEIN: 7.2 g/dL (ref 6.1–8.1)
Total Bilirubin: 0.6 mg/dL (ref 0.2–1.2)

## 2017-04-13 LAB — LIPID PANEL
Cholesterol: 92 mg/dL (ref <200)
HDL: 42 mg/dL — ABNORMAL LOW (ref >50)
LDL CHOLESTEROL (CALC): 35 mg/dL
NON-HDL CHOLESTEROL (CALC): 50 mg/dL (ref <130)
TRIGLYCERIDES: 71 mg/dL (ref <150)
Total CHOL/HDL Ratio: 2.2 (calc) (ref <5.0)

## 2017-04-13 LAB — FERRITIN: Ferritin: 17 ng/mL — ABNORMAL LOW (ref 20–288)

## 2017-04-13 LAB — IRON: Iron: 57 ug/dL (ref 45–160)

## 2017-04-22 ENCOUNTER — Ambulatory Visit (INDEPENDENT_AMBULATORY_CARE_PROVIDER_SITE_OTHER): Payer: PPO

## 2017-04-22 ENCOUNTER — Encounter (INDEPENDENT_AMBULATORY_CARE_PROVIDER_SITE_OTHER): Payer: Self-pay | Admitting: Orthopaedic Surgery

## 2017-04-22 ENCOUNTER — Ambulatory Visit (INDEPENDENT_AMBULATORY_CARE_PROVIDER_SITE_OTHER): Payer: PPO | Admitting: Orthopaedic Surgery

## 2017-04-22 DIAGNOSIS — M1812 Unilateral primary osteoarthritis of first carpometacarpal joint, left hand: Secondary | ICD-10-CM

## 2017-04-22 HISTORY — DX: Unilateral primary osteoarthritis of first carpometacarpal joint, left hand: M18.12

## 2017-04-22 NOTE — Progress Notes (Signed)
Office Visit Note   Patient: Catherine Snyder           Date of Birth: Jan 03, 1941           MRN: [id number] Visit Date: 04/22/2017              Requested by: Gildardo Cranker, [address] [place], [place] [id number] PCP: Gildardo Cranker, DO   Assessment & Plan: Visit Diagnoses:  1. Primary osteoarthritis of first carpometacarpal joint of left hand     Plan: Impression 77 year old female with left thumb basal joint arthritis.  Patient is on Xarelto so therefore cannot take NSAIDs.  Voltaren gel gives her good relief.  Thumb abduction orthotic was provided today.  Questions encouraged and answered.  Follow-up as needed.  She may consider injection if she continues to have pain.  Follow-Up Instructions: Return if symptoms worsen or fail to improve.   Orders:  Orders Placed This Encounter  Procedures  . XR Hand Complete Left   No orders of the defined types were placed in this encounter.     Procedures: No procedures performed   Clinical Data: No additional findings.   Subjective: Chief Complaint  Patient presents with  . Left Hand - Pain    Patient is a 77 year old female comes in with left thumb pain at the base of her thumb with associated not.  She denies any injuries.  She uses Voltaren gel with good relief.  She is on chronic Xarelto for DVT and PE.  The pain is worse.  She is right-hand dominant.  Denies any numbness and tingling.    Review of Systems  Constitutional: Negative.   HENT: Negative.   Eyes: Negative.   Respiratory: Negative.   Cardiovascular: Negative.   Endocrine: Negative.   Musculoskeletal: Negative.   Neurological: Negative.   Hematological: Negative.   Psychiatric/Behavioral: Negative.   All other systems reviewed and are negative.    Objective: Vital Signs: There were no vitals taken for this visit.  Physical Exam  Constitutional: She is oriented to person, place, and time. She appears well-developed and well-nourished.    HENT:  Head: Normocephalic and atraumatic.  Eyes: EOM are normal.  Neck: Neck supple.  Pulmonary/Chest: Effort normal.  Abdominal: Soft.  Neurological: She is alert and oriented to person, place, and time.  Skin: Skin is warm. Capillary refill takes less than 2 seconds.  Psychiatric: She has a normal mood and affect. Her behavior is normal. Judgment and thought content normal.  Nursing note and vitals reviewed.   Ortho Exam Left hand exam shows positive grind test.  Negative Finkelstein's.  She does have mild swelling at the base of the thumb metacarpal. Specialty Comments:  No specialty comments available.  Imaging: Xr Hand Complete Left  Result Date: 04/22/2017 Advanced degenerative joint disease of left thumb CMC joint    PMFS History: Patient Active Problem List   Diagnosis Date Noted  . Primary osteoarthritis of first carpometacarpal joint of left hand 04/22/2017  . RLS (restless legs syndrome) 05/21/2016  . Iron deficiency anemia 05/21/2016  . High risk medication use 05/21/2016  . Dyslipidemia 03/02/2016  . Chronic anticoagulation 03/02/2016  . Saddle embolus of pulmonary artery without acute cor pulmonale (HCC)   . CAD in native artery   . Absolute anemia   . Pulmonary hypertension ([place])   . Pulmonary hypertension due to thromboembolism ([place]) 02/05/2016  . Elevated troponin   . Dyspnea on exertion   . Demand ischemia of myocardium (  [place]) - Not NSTEMI 02/03/2016  . Essential hypertension 11/27/2014   Past Medical History:  Diagnosis Date  . Bladder cancer ([place])    a. s/p resection in 06/2015  . Dysuria   . Frequency of urination   . Hematuria   . Hypertension   . Nocturia   . Wears glasses   . Wears partial dentures    upper    Family History  Problem Relation Age of Onset  . CAD Mother 28  . Heart disease Father 58  . CAD Brother 58    Past Surgical History:  Procedure Laterality Date  . CARDIAC CATHETERIZATION N/A 02/04/2016   Procedure:  RIGHT/LEFT HEART CATH AND CORONARY ANGIOGRAPHY;  Surgeon: Troy Sine, MD;  Location: [facility];  Service: Cardiovascular;  Laterality: N/A;  . CYSTOSCOPY W/ RETROGRADES Bilateral 07/07/2015   Procedure: CYSTOSCOPY WITH RETROGRADE PYELOGRAM;  Surgeon: Cleon Gustin, MD;  Location: [facility];  Service: Urology;  Laterality: Bilateral;  . IR GENERIC HISTORICAL  02/07/2016   IR ANGIOGRAM SELECTIVE EACH ADDITIONAL VESSEL 02/07/2016 Jacqulynn Cadet, MD [facility]  . IR GENERIC HISTORICAL  02/07/2016   IR US GUIDE VASC ACCESS RIGHT 02/07/2016 Jacqulynn Cadet, MD [facility]  . IR GENERIC HISTORICAL  02/07/2016   IR ANGIOGRAM PULMONARY BILATERAL SELECTIVE 02/07/2016 Jacqulynn Cadet, MD [facility]  . IR GENERIC HISTORICAL  02/07/2016   IR INFUSION THROMBOL ARTERIAL INITIAL (MS) 02/07/2016 Jacqulynn Cadet, MD [facility]  . IR GENERIC HISTORICAL  02/07/2016   IR INFUSION THROMBOL ARTERIAL INITIAL (MS) 02/07/2016 Jacqulynn Cadet, MD [facility]  . IR GENERIC HISTORICAL  02/07/2016   IR ANGIOGRAM SELECTIVE EACH ADDITIONAL VESSEL 02/07/2016 Jacqulynn Cadet, MD [facility]  . NO PAST SURGERIES    . TRANSURETHRAL RESECTION OF BLADDER TUMOR WITH GYRUS (TURBT-GYRUS) N/A 07/07/2015   Procedure: TRANSURETHRAL RESECTION OF BLADDER TUMOR WITH GYRUS (TURBT-GYRUS);  Surgeon: Cleon Gustin, MD;  Location: [facility];  Service: Urology;  Laterality: N/A;   Social History   Occupational History  . Occupation: Retired Automotive engineer  Tobacco Use  . Smoking status: Never Smoker  . Smokeless tobacco: Never Used  Substance and Sexual Activity  . Alcohol use: No  . Drug use: No  . Sexual activity: No

## 2017-05-06 DIAGNOSIS — H25812 Combined forms of age-related cataract, left eye: Secondary | ICD-10-CM | POA: Diagnosis not present

## 2017-05-06 DIAGNOSIS — Z961 Presence of intraocular lens: Secondary | ICD-10-CM | POA: Diagnosis not present

## 2017-05-06 DIAGNOSIS — H04123 Dry eye syndrome of bilateral lacrimal glands: Secondary | ICD-10-CM | POA: Diagnosis not present

## 2017-05-12 ENCOUNTER — Ambulatory Visit: Payer: PPO

## 2017-05-18 ENCOUNTER — Ambulatory Visit: Payer: Self-pay

## 2017-06-03 ENCOUNTER — Ambulatory Visit: Payer: Self-pay

## 2017-06-10 ENCOUNTER — Encounter (HOSPITAL_COMMUNITY): Payer: Self-pay | Admitting: Emergency Medicine

## 2017-06-10 ENCOUNTER — Other Ambulatory Visit: Payer: Self-pay

## 2017-06-10 DIAGNOSIS — Z5321 Procedure and treatment not carried out due to patient leaving prior to being seen by health care provider: Secondary | ICD-10-CM | POA: Insufficient documentation

## 2017-06-10 DIAGNOSIS — R040 Epistaxis: Secondary | ICD-10-CM | POA: Insufficient documentation

## 2017-06-10 NOTE — ED Triage Notes (Signed)
C/o intermittent nosebleed x 1 week.  Pt takes Xarelto. Reports nosebleed that lasted 15 min tonight and stopped 10 min PTA.

## 2017-06-10 NOTE — ED Notes (Signed)
Notified Dr. Jeneen Rinks of pt and order received for I-stat.

## 2017-06-11 ENCOUNTER — Emergency Department (HOSPITAL_COMMUNITY)
Admission: EM | Admit: 2017-06-11 | Discharge: 2017-06-11 | Disposition: A | Discharge status: Home / Self Care | Payer: PPO | Attending: Emergency Medicine | Admitting: Emergency Medicine

## 2017-06-13 ENCOUNTER — Ambulatory Visit (INDEPENDENT_AMBULATORY_CARE_PROVIDER_SITE_OTHER): Payer: PPO | Admitting: Nurse Practitioner

## 2017-06-13 ENCOUNTER — Ambulatory Visit: Payer: PPO

## 2017-06-13 ENCOUNTER — Encounter: Payer: Self-pay | Admitting: Nurse Practitioner

## 2017-06-13 ENCOUNTER — Ambulatory Visit (INDEPENDENT_AMBULATORY_CARE_PROVIDER_SITE_OTHER): Payer: PPO

## 2017-06-13 VITALS — BP 162/94 | HR 73 | Temp 98.3°F | Ht 63.0 in | Wt 176.0 lb

## 2017-06-13 DIAGNOSIS — Z0000 Encounter for general adult medical examination without abnormal findings: Secondary | ICD-10-CM | POA: Diagnosis not present

## 2017-06-13 DIAGNOSIS — Z23 Encounter for immunization: Secondary | ICD-10-CM | POA: Diagnosis not present

## 2017-06-13 DIAGNOSIS — Z599 Problem related to housing and economic circumstances, unspecified: Secondary | ICD-10-CM

## 2017-06-13 DIAGNOSIS — R040 Epistaxis: Secondary | ICD-10-CM | POA: Diagnosis not present

## 2017-06-13 DIAGNOSIS — Z598 Other problems related to housing and economic circumstances: Secondary | ICD-10-CM | POA: Diagnosis not present

## 2017-06-13 DIAGNOSIS — E2839 Other primary ovarian failure: Secondary | ICD-10-CM | POA: Diagnosis not present

## 2017-06-13 DIAGNOSIS — I2692 Saddle embolus of pulmonary artery without acute cor pulmonale: Secondary | ICD-10-CM | POA: Diagnosis not present

## 2017-06-13 NOTE — Patient Instructions (Signed)
Catherine Snyder , Thank you for taking time to come for your Medicare Wellness Visit. I appreciate your ongoing commitment to your health goals. Please review the following plan we discussed and let me know if I can assist you in the future.   Screening recommendations/referrals: Colonoscopy due, declined Mammogram up to date, due 05/25/2018 Bone Density due, ordered Recommended yearly ophthalmology/optometry visit for glaucoma screening and checkup Recommended yearly dental visit for hygiene and checkup  Vaccinations: Influenza vaccine due, declined Pneumococcal vaccine 13 given today. PNA 23 due 06/14/2018 Tdap vaccine due, declined Shingles vaccine due, declined    Advanced directives: in chart  Conditions/risks identified: none  Next appointment: Dr. Eulas Snyder 08/12/2017 @ 10:30am             Catherine Dense, RN 06/16/2018 @ 1:45pm   Preventive Care 76 Years and Older, Female Preventive care refers to lifestyle choices and visits with your health care provider that can promote health and wellness. What does preventive care include?  A yearly physical exam. This is also called an annual well check.  Dental exams once or twice a year.  Routine eye exams. Ask your health care provider how often you should have your eyes checked.  Personal lifestyle choices, including:  Daily care of your teeth and gums.  Regular physical activity.  Eating a healthy diet.  Avoiding tobacco and drug use.  Limiting alcohol use.  Practicing safe sex.  Taking low-dose aspirin every day.  Taking vitamin and mineral supplements as recommended by your health care provider. What happens during an annual well check? The services and screenings done by your health care provider during your annual well check will depend on your age, overall health, lifestyle risk factors, and family history of disease. Counseling  Your health care provider may ask you questions about your:  Alcohol use.  Tobacco  use.  Drug use.  Emotional well-being.  Home and relationship well-being.  Sexual activity.  Eating habits.  History of falls.  Memory and ability to understand (cognition).  Work and work Statistician.  Reproductive health. Screening  You may have the following tests or measurements:  Height, weight, and BMI.  Blood pressure.  Lipid and cholesterol levels. These may be checked every 5 years, or more frequently if you are over 76 years old.  Skin check.  Lung cancer screening. You may have this screening every year starting at age 76 if you have a 30-pack-year history of smoking and currently smoke or have quit within the past 15 years.  Fecal occult blood test (FOBT) of the stool. You may have this test every year starting at age 76.  Flexible sigmoidoscopy or colonoscopy. You may have a sigmoidoscopy every 5 years or a colonoscopy every 10 years starting at age 76.  Hepatitis C blood test.  Hepatitis B blood test.  Sexually transmitted disease (STD) testing.  Diabetes screening. This is done by checking your blood sugar (glucose) after you have not eaten for a while (fasting). You may have this done every 1-3 years.  Bone density scan. This is done to screen for osteoporosis. You may have this done starting at age 76.  Mammogram. This may be done every 1-2 years. Talk to your health care provider about how often you should have regular mammograms. Talk with your health care provider about your test results, treatment options, and if necessary, the need for more tests. Vaccines  Your health care provider may recommend certain vaccines, such as:  Influenza vaccine. This is  recommended every year.  Tetanus, diphtheria, and acellular pertussis (Tdap, Td) vaccine. You may need a Td booster every 10 years.  Zoster vaccine. You may need this after age 76.  Pneumococcal 13-valent conjugate (PCV13) vaccine. One dose is recommended after age 76.  Pneumococcal  polysaccharide (PPSV23) vaccine. One dose is recommended after age 76. Talk to your health care provider about which screenings and vaccines you need and how often you need them. This information is not intended to replace advice given to you by your health care provider. Make sure you discuss any questions you have with your health care provider. Document Released: 03/28/2015 Document Revised: 11/19/2015 Document Reviewed: 12/31/2014 Elsevier Interactive Patient Education  2017 [employer] Prevention in the Home Falls can cause injuries. They can happen to people of all ages. There are many things you can do to make your home safe and to help prevent falls. What can I do on the outside of my home?  Regularly fix the edges of walkways and driveways and fix any cracks.  Remove anything that might make you trip as you walk through a door, such as a raised step or threshold.  Trim any bushes or trees on the path to your home.  Use bright outdoor lighting.  Clear any walking paths of anything that might make someone trip, such as rocks or tools.  Regularly check to see if handrails are loose or broken. Make sure that both sides of any steps have handrails.  Any raised decks and porches should have guardrails on the edges.  Have any leaves, snow, or ice cleared regularly.  Use sand or salt on walking paths during winter.  Clean up any spills in your garage right away. This includes oil or grease spills. What can I do in the bathroom?  Use night lights.  Install grab bars by the toilet and in the tub and shower. Do not use towel bars as grab bars.  Use non-skid mats or decals in the tub or shower.  If you need to sit down in the shower, use a plastic, non-slip stool.  Keep the floor dry. Clean up any water that spills on the floor as soon as it happens.  Remove soap buildup in the tub or shower regularly.  Attach bath mats securely with double-sided non-slip rug  tape.  Do not have throw rugs and other things on the floor that can make you trip. What can I do in the bedroom?  Use night lights.  Make sure that you have a light by your bed that is easy to reach.  Do not use any sheets or blankets that are too big for your bed. They should not hang down onto the floor.  Have a firm chair that has side arms. You can use this for support while you get dressed.  Do not have throw rugs and other things on the floor that can make you trip. What can I do in the kitchen?  Clean up any spills right away.  Avoid walking on wet floors.  Keep items that you use a lot in easy-to-reach places.  If you need to reach something above you, use a strong step stool that has a grab bar.  Keep electrical cords out of the way.  Do not use floor polish or wax that makes floors slippery. If you must use wax, use non-skid floor wax.  Do not have throw rugs and other things on the floor that can make you trip.  What can I do with my stairs?  Do not leave any items on the stairs.  Make sure that there are handrails on both sides of the stairs and use them. Fix handrails that are broken or loose. Make sure that handrails are as long as the stairways.  Check any carpeting to make sure that it is firmly attached to the stairs. Fix any carpet that is loose or worn.  Avoid having throw rugs at the top or bottom of the stairs. If you do have throw rugs, attach them to the floor with carpet tape.  Make sure that you have a light switch at the top of the stairs and the bottom of the stairs. If you do not have them, ask someone to add them for you. What else can I do to help prevent falls?  Wear shoes that:  Do not have high heels.  Have rubber bottoms.  Are comfortable and fit you well.  Are closed at the toe. Do not wear sandals.  If you use a stepladder:  Make sure that it is fully opened. Do not climb a closed stepladder.  Make sure that both sides of the  stepladder are locked into place.  Ask someone to hold it for you, if possible.  Clearly mark and make sure that you can see:  Any grab bars or handrails.  First and last steps.  Where the edge of each step is.  Use tools that help you move around (mobility aids) if they are needed. These include:  Canes.  Walkers.  Scooters.  Crutches.  Turn on the lights when you go into a dark area. Replace any light bulbs as soon as they burn out.  Set up your furniture so you have a clear path. Avoid moving your furniture around.  If any of your floors are uneven, fix them.  If there are any pets around you, be aware of where they are.  Review your medicines with your doctor. Some medicines can make you feel dizzy. This can increase your chance of falling. Ask your doctor what other things that you can do to help prevent falls. This information is not intended to replace advice given to you by your health care provider. Make sure you discuss any questions you have with your health care provider. Document Released: 12/26/2008 Document Revised: 08/07/2015 Document Reviewed: 04/05/2014 Elsevier Interactive Patient Education  2017 [employer].

## 2017-06-13 NOTE — Progress Notes (Signed)
Subjective:   Catherine Snyder is a 77 y.o. female who presents for Medicare Annual (Subsequent) preventive examination.      Objective:     Vitals: BP (!) 162/94 (BP Location: Left Arm, Patient Position: Sitting)   Pulse 73   Temp 98.3 F (36.8 C) (Oral)   Ht 5\' 3"  (1.6 m)   Wt 176 lb (79.8 kg)   SpO2 99%   BMI 31.18 kg/m   Body mass index is 31.18 kg/m.  Advanced Directives 06/13/2017 06/13/2017 09/22/2016 05/14/2016 05/07/2016 03/17/2016 02/13/2016  Does Patient Have a Medical Advance Directive? Yes Yes Yes Yes Yes Yes Yes  Type of Paramedic of [place];Living will [place];Living will [place];Living will [place];Living will [place];Living will Healthcare Power of [place]  Does patient want to make changes to medical advance directive? No - Patient declined No - Patient declined No - Patient declined - - - -  Copy of [place] in Chart? Yes Yes Yes Yes Yes Yes Yes  Would patient like information on creating a medical advance directive? - - - - - - -    Tobacco Social History   Tobacco Use  Smoking Status Never Smoker  Smokeless Tobacco Never Used     Counseling given: Not Answered   Clinical Intake:  Pre-visit preparation completed: No  Pain : No/denies pain     Nutritional Risks: None Diabetes: No  How often do you need to have someone help you when you read instructions, pamphlets, or other written materials from your doctor or pharmacy?: 1 - Never What is the last grade level you completed in school?: 12th grade  Interpreter Needed?: No  Information entered by :: [name], RN  Past Medical History:  Diagnosis Date  . Bladder cancer ([place])    a. s/p resection in 06/2015  . Dysuria   . Frequency of urination   . Hematuria   . Hypertension   . Nocturia   . Wears glasses   . Wears partial dentures      upper   Past Surgical History:  Procedure Laterality Date  . CARDIAC CATHETERIZATION N/A 02/04/2016   Procedure: RIGHT/LEFT HEART CATH AND CORONARY ANGIOGRAPHY;  Surgeon: Troy Sine, MD;  Location: [place] [facility];  Service: Cardiovascular;  Laterality: N/A;  . CYSTOSCOPY W/ RETROGRADES Bilateral 07/07/2015   Procedure: CYSTOSCOPY WITH RETROGRADE PYELOGRAM;  Surgeon: Cleon Gustin, MD;  Location[facility];  Service: Urology;  Laterality: Bilateral;  . IR GENERIC HISTORICAL  02/07/2016   IR ANGIOGRAM SELECTIVE EACH ADDITIONAL VESSEL 02/07/2016 Jacqulynn Cadet, MD [facility]  . IR GENERIC HISTORICAL  02/07/2016   IR US GUIDE VASC ACCESS RIGHT 02/07/2016 Jacqulynn Cadet, MD [facility]  . IR GENERIC HISTORICAL  02/07/2016   IR ANGIOGRAM PULMONARY BILATERAL SELECTIVE 02/07/2016 Jacqulynn Cadet, MD [facility]  . IR GENERIC HISTORICAL  02/07/2016   IR INFUSION THROMBOL ARTERIAL INITIAL (MS) 02/07/2016 Jacqulynn Cadet, MD [facility]  . IR GENERIC HISTORICAL  02/07/2016   IR INFUSION THROMBOL ARTERIAL INITIAL (MS) 02/07/2016 Jacqulynn Cadet, MD [facility]  . IR GENERIC HISTORICAL  02/07/2016   IR ANGIOGRAM SELECTIVE EACH ADDITIONAL VESSEL 02/07/2016 Jacqulynn Cadet, MD [facility]  . NO PAST SURGERIES    . TRANSURETHRAL RESECTION OF BLADDER TUMOR WITH GYRUS (TURBT-GYRUS) N/A 07/07/2015   Procedure: TRANSURETHRAL RESECTION OF BLADDER TUMOR WITH GYRUS (TURBT-GYRUS);  Surgeon:  Cleon Gustin, MD;  Location[facility];  Service: Urology;  Laterality: N/A;   Family History  Problem Relation Age of Onset  . CAD Mother 31  . Heart disease Father 74  . CAD Brother 52   Social History   Socioeconomic History  . Marital status: Single    Spouse name: Not on file  . Number of children: Not on file  . Years of education: Not on file  . Highest education level: Not on file  Occupational History  . Occupation: Retired  Public house manager  . Financial resource strain: Not hard at all  . Food insecurity:    Worry: Never true    Inability: Never true  . Transportation needs:    Medical: No    Non-medical: No  Tobacco Use  . Smoking status: Never Smoker  . Smokeless tobacco: Never Used  Substance and Sexual Activity  . Alcohol use: No  . Drug use: No  . Sexual activity: Never  Lifestyle  . Physical activity:    Days per week: 7 days    Minutes per session: 10 min  . Stress: Only a little  Relationships  . Social connections:    Talks on phone: More than three times a week    Gets together: More than three times a week    Attends religious service: More than 4 times per year    Active member of club or organization: No    Attends meetings of clubs or organizations: Never    Relationship status: Widowed  Other Topics Concern  . Not on file  Social History Narrative    Diet:   Do you drink/eat things with caffeine? Yes coffee   Marital status: Divorced                              What year were you married?    Do you live in a house, apartment, assisted living, condo, trailer, etc)? House   Is it one or more stories? 1 story   How many persons live in your home? 4   Do you have any pets in your home? 0   Current or past profession: Dietitian   Do you exercise? Yes                                       Type & how often:  Walk daily   Do you have a living will? No   Do you have a DNR Form? No   Do you have a POA/HPOA forms? No    Outpatient Encounter Medications as of 06/13/2017  Medication Sig  . amLODipine (NORVASC) 10 MG tablet Take 1 tablet (10 mg total) by mouth daily.  . Ascorbic Acid (VITAMIN C) 1000 MG tablet Take 1,000 mg by mouth daily.  Marland Kitchen atorvastatin (LIPITOR) 80 MG tablet TAKE 1 TABLET BY MOUTH EVERY DAY AT 6 PM  . Cholecalciferol (VITAMIN D) 2000 units tablet Take 2,000 Units by mouth daily.  . diclofenac sodium (VOLTAREN) 1 % GEL Apply 2 g topically 4 (four) times  daily. To left thumb swelling  . nitroGLYCERIN (NITROSTAT) 0.4 MG SL tablet Place 1 tablet (0.4 mg total) under the tongue every 5 (five) minutes x 3 doses as needed for chest pain.  Marland Kitchen rOPINIRole (REQUIP) 0.5 MG tablet TAKE 1 TABLET  AT BEDTIME  . XARELTO 20 MG TABS tablet TAKE 1 TABLET EVERY DAY WITH SUPPER   No facility-administered encounter medications on file as of 06/13/2017.     Activities of Daily Living In your present state of health, do you have any difficulty performing the following activities: 06/13/2017  Hearing? N  Vision? N  Difficulty concentrating or making decisions? N  Walking or climbing stairs? N  Dressing or bathing? N  Doing errands, shopping? N  Preparing Food and eating ? N  Using the Toilet? N  In the past six months, have you accidently leaked urine? N  Do you have problems with loss of bowel control? N  Managing your Medications? N  Managing your Finances? N  Housekeeping or managing your Housekeeping? N  Some recent data might be hidden    Patient Care Team: Gildardo Cranker, DO as PCP - General (Internal Medicine)    Assessment:   This is a routine wellness examination for [place].  Exercise Activities and Dietary recommendations Current Exercise Habits: Home exercise routine, Type of exercise: stretching, Time (Minutes): 10, Frequency (Times/Week): 7, Weekly Exercise (Minutes/Week): 70, Intensity: Mild, Exercise limited by: None identified  Goals    None      Fall Risk Fall Risk  06/13/2017 06/13/2017 04/12/2017 09/22/2016 05/14/2016  Falls in the past year? No No No No Yes  Number falls in past yr: - - - - 1  Injury with Fall? - - - - Yes   Is the patient's home free of loose throw rugs in walkways, pet beds, electrical cords, etc?   yes      Grab bars in the bathroom? yes      Handrails on the stairs?   yes      Adequate lighting?   yes  Depression Screen PHQ 2/9 Scores 06/13/2017 05/21/2016 05/07/2016 05/07/2015  PHQ - 2 Score 0 0 0 0  PHQ- 9 Score  - 0 - -     Cognitive Function MMSE - Mini Mental State Exam 06/13/2017 05/07/2016 05/07/2015  Not completed: - - (No Data)  Orientation to time 5 5 4   Orientation to Place 3 4 4   Registration 3 3 3   Attention/ Calculation 5 5 5   Recall 1 3 2   Language- name 2 objects 2 2 2   Language- repeat 1 1 0  Language- follow 3 step command 3 3 3   Language- read & follow direction 1 1 1   Write a sentence 1 0 1  Copy design 1 1 0  Total score 26 28 25         Immunization History  Administered Date(s) Administered  . Influenza,inj,Quad PF,6+ Mos 11/27/2014  . Pneumococcal Conjugate-13 06/13/2017    Qualifies for Shingles Vaccine? Yes, educated and declined  Screening Tests Health Maintenance  Topic Date Due  . COLONOSCOPY  01/08/1992  . DEXA SCAN  01/08/2007  . INFLUENZA VACCINE  04/16/2019 (Originally 10/13/2017)  . TETANUS/TDAP  04/16/2019 (Originally [birth year]/1961)  . MAMMOGRAM  12/10/2017  . PNA vac Low Risk Adult (2 of 2 - PPSV23) 06/14/2018    Cancer Screenings: Lung: Low Dose CT Chest recommended if Age 79-80 years, 30 pack-year currently smoking OR have quit w/in 15years. Patient does not qualify. Breast:  Up to date on Mammogram? Yes   Up to date of Bone Density/Dexa? No, ordered Colorectal: due, declined  Additional Screenings:  Hepatitis C Screening: declined PNA 13 given today Flu vaccine due and declined TDAP due and declined  Plan:    I have personally reviewed and addressed the Medicare Annual Wellness questionnaire and have noted the following in the patient's chart:  A. Medical and social history B. Use of alcohol, tobacco or illicit drugs  C. Current medications and supplements D. Functional ability and status E.  Nutritional status F.  Physical activity G. Advance directives H. List of other physicians I.  Hospitalizations, surgeries, and ER visits in previous 12 months J.  [place] to include hearing, vision, cognitive,  depression L. Referrals and appointments - C3  In addition, I have reviewed and discussed with patient certain preventive protocols, quality metrics, and best practice recommendations. A written personalized care plan for preventive services as well as general preventive health recommendations were provided to patient.  See attached scanned questionnaire for additional information.   Signed,   [name], RN Nurse Health Advisor  Patient Concerns: none

## 2017-06-13 NOTE — Patient Instructions (Signed)
To avoid putting fingers in your nose To use PLAIN nasal saline twice daily into both nostrils to keep membranes moist    Nosebleed, Adult A nosebleed is when blood comes out of the nose. Nosebleeds are common. Usually, they are not a sign of a serious condition. Nosebleeds can happen if a small blood vessel in your nose starts to bleed or if the lining of your nose (mucous membrane) cracks. They are commonly caused by:  Allergies.  Colds.  Picking your nose.  Blowing your nose too hard.  An injury from sticking an object into your nose or getting hit in the nose.  Dry or cold air.  Less common causes of nosebleeds include:  Toxic fumes.  Something abnormal in the nose or in the air-filled spaces in the bones of the face (sinuses).  Growths in the nose, such as polyps.  Medicines or conditions that cause blood to clot slowly.  Certain illnesses or procedures that irritate or dry out the nasal passages.  Follow these instructions at home: When you have a nosebleed:  Sit down and tilt your head slightly forward.  Use a clean towel or tissue to pinch your nostrils under the bony part of your nose. After 10 minutes, let go of your nose and see if bleeding starts again. Do not release pressure before that time. If there is still bleeding, repeat the pinching and holding for 10 minutes until the bleeding stops.  Do not place tissues or gauze in the nose to stop bleeding.  Avoid lying down and avoid tilting your head backward. That may make blood collect in the throat and cause gagging or coughing.  Use a nasal spray decongestant to help with a nosebleed as told by your health care provider.  Do not use petroleum jelly or mineral oil in your nose. It can drip into your lungs. After a nosebleed:  Avoid blowing your nose or sniffing for a number of hours.  Avoid straining, lifting, or bending at the waist for several days. You may resume other normal activities as you are  able.  Use saline spray or a humidifier as told by your health care provider.  Aspirinand blood thinners make bleeding more likely. If you are prescribed these medicines and you suffer from nosebleeds: ? Ask your health care provider if you should stop taking the medicines or if you should adjust the dose. ? Do not stop taking medicines that your health care provider has recommended unless told by your health care provider.  If your nosebleed was caused by dry mucous membranes, use over-the-counter saline nasal spray or gel. This will keep the mucous membranes moist and allow them to heal. If you must use a lubricant: ? Choose one that is water-soluble. ? Use only as much as you need and use it only as often as needed. ? Do not lie down until several hours after you use it. Contact a health care provider if:  You have a fever.  You get nosebleeds often or more often than usual.  You bruise very easily.  You have a nosebleed from having something stuck in your nose.  You have bleeding in your mouth.  You vomit or cough up brown material.  You have a nosebleed after you start a new medicine. Get help right away if:  You have a nosebleed after a fall or a head injury.  Your nosebleed does not go away after 20 minutes.  You feel dizzy or weak.  You have  unusual bleeding from other parts of your body.  You have unusual bruising on other parts of your body.  You become sweaty.  You vomit blood. This information is not intended to replace advice given to you by your health care provider. Make sure you discuss any questions you have with your health care provider. Document Released: 12/09/2004 Document Revised: 10/30/2015 Document Reviewed: 09/16/2015 Elsevier Interactive Patient Education  [employer].

## 2017-06-13 NOTE — Progress Notes (Signed)
Careteam: Patient Care Team: Gildardo Cranker, DO as PCP - General (Internal Medicine)  Advanced Directive information Does Patient Have a Medical Advance Directive?: Yes, Type of Advance Directive: [place];Living will, Does patient want to make changes to medical advance directive?: No - Patient declined  No Known Allergies  Chief Complaint  Patient presents with  . Acute Visit    Pt is being seen due to severe nosebleeds over the last week. Pt reports that she has begun to have blood clots during nosebleeds. Pt reports a tingling in the top of her head after bleeding stops.   . Other    Daughter in room     HPI: Patient is a 78 y.o. female seen in the office today due to ongoing nose bleeds.   Pt has been on xarelto for ~2 years for PE, after she went to cardiology the last time they discussed her stopping medication (she has only had 1 PE) but wanted to wait for follow up before deciding to stop.   Has had episode of rectal bleeding which stopped on its own in July 2018.   Not taking ASA.   Nosebleeds have occurred off and on since prior to xarelto start.  Had episodes on and off more frequently this past week.  Does not last long. Will pass a clot and then it will slow down and stop.  Episodes only lasting ~1 mins.  No lightheadedness or dizziness.  Bleeding generally starts after she is "cleaning out her nose"  Admits to sticking finger up her nose to clean Has been using afrin but was told to stop.   No abnormal bruising or bleeding.   Review of Systems:  Review of Systems  Constitutional: Negative for chills, fever and weight loss.  HENT: Positive for nosebleeds. Negative for congestion and sinus pain.   Respiratory: Negative for hemoptysis and shortness of breath.   Cardiovascular: Negative for chest pain.  Gastrointestinal: Negative for blood in stool.  Genitourinary: Negative for hematuria.  Neurological: Negative for dizziness, tingling  and headaches.  Endo/Heme/Allergies: Does not bruise/bleed easily.    Past Medical History:  Diagnosis Date  . Bladder cancer ([place])    a. s/p resection in 06/2015  . Dysuria   . Frequency of urination   . Hematuria   . Hypertension   . Nocturia   . Wears glasses   . Wears partial dentures    upper   Past Surgical History:  Procedure Laterality Date  . CARDIAC CATHETERIZATION N/A 02/04/2016   Procedure: RIGHT/LEFT HEART CATH AND CORONARY ANGIOGRAPHY;  Surgeon: Troy Sine, MD;  Location: [place] [facility];  Service: Cardiovascular;  Laterality: N/A;  . CYSTOSCOPY W/ RETROGRADES Bilateral 07/07/2015   Procedure: CYSTOSCOPY WITH RETROGRADE PYELOGRAM;  Surgeon: Cleon Gustin, MD;  Location: [facility];  Service: Urology;  Laterality: Bilateral;  . IR GENERIC HISTORICAL  02/07/2016   IR ANGIOGRAM SELECTIVE EACH ADDITIONAL VESSEL 02/07/2016 Jacqulynn Cadet, MD [facility]  . IR GENERIC HISTORICAL  02/07/2016   IR US GUIDE VASC ACCESS RIGHT 02/07/2016 Jacqulynn Cadet, MD [facility]  . IR GENERIC HISTORICAL  02/07/2016   IR ANGIOGRAM PULMONARY BILATERAL SELECTIVE 02/07/2016 Jacqulynn Cadet, MD [facility]  . IR GENERIC HISTORICAL  02/07/2016   IR INFUSION THROMBOL ARTERIAL INITIAL (MS) 02/07/2016 Jacqulynn Cadet, MD [facility]  . IR GENERIC HISTORICAL  02/07/2016   IR INFUSION THROMBOL ARTERIAL INITIAL (MS) 02/07/2016 Jacqulynn Cadet, MD [facility]  . IR  GENERIC HISTORICAL  02/07/2016   IR ANGIOGRAM SELECTIVE EACH ADDITIONAL VESSEL 02/07/2016 Jacqulynn Cadet, MD [facility]  . NO PAST SURGERIES    . TRANSURETHRAL RESECTION OF BLADDER TUMOR WITH GYRUS (TURBT-GYRUS) N/A 07/07/2015   Procedure: TRANSURETHRAL RESECTION OF BLADDER TUMOR WITH GYRUS (TURBT-GYRUS);  Surgeon: Cleon Gustin, MD;  Location: [facility];  Service: Urology;  Laterality: N/A;   Social History:   reports that she has never smoked. She has never used  smokeless tobacco. She reports that she does not drink alcohol or use drugs.  Family History  Problem Relation Age of Onset  . CAD Mother 81  . Heart disease Father 70  . CAD Brother 40    Medications: Patient's Medications  New Prescriptions   No medications on file  Previous Medications   AMLODIPINE (NORVASC) 10 MG TABLET    Take 1 tablet (10 mg total) by mouth daily.   ASCORBIC ACID (VITAMIN C) 1000 MG TABLET    Take 1,000 mg by mouth daily.   ATORVASTATIN (LIPITOR) 80 MG TABLET    TAKE 1 TABLET BY MOUTH EVERY DAY AT 6 PM   CHOLECALCIFEROL (VITAMIN D) 2000 UNITS TABLET    Take 2,000 Units by mouth daily.   DICLOFENAC SODIUM (VOLTAREN) 1 % GEL    Apply 2 g topically 4 (four) times daily. To left thumb swelling   NITROGLYCERIN (NITROSTAT) 0.4 MG SL TABLET    Place 1 tablet (0.4 mg total) under the tongue every 5 (five) minutes x 3 doses as needed for chest pain.   ROPINIROLE (REQUIP) 0.5 MG TABLET    TAKE 1 TABLET AT BEDTIME   XARELTO 20 MG TABS TABLET    TAKE 1 TABLET EVERY DAY WITH SUPPER  Modified Medications   No medications on file  Discontinued Medications   FERROUS SULFATE 325 (65 FE) MG TABLET    Take 1 tablet (325 mg total) by mouth 2 (two) times daily.     Physical Exam:  There were no vitals filed for this visit. There is no height or weight on file to calculate BMI.  Physical Exam  Constitutional: She is oriented to person, place, and time. She appears well-developed and well-nourished.  HENT:  Nose: No mucosal edema, rhinorrhea or nose lacerations.  Blood noted to left septum.  Eyes: Pupils are equal, round, and reactive to light. EOM are normal.  Cardiovascular: Normal rate, regular rhythm and normal heart sounds.  Pulmonary/Chest: Effort normal and breath sounds normal.  Musculoskeletal: She exhibits no edema.  Neurological: She is alert and oriented to person, place, and time.  Skin: Skin is warm and dry.  Psychiatric: She has a normal mood and affect.     Labs reviewed: Basic Metabolic Panel: Recent Labs    09/22/16 1204 04/12/17 1442  NA 141 139  K 3.8 4.0  CL 105 106  CO2 25 27  GLUCOSE 105* 102  BUN 12 9  CREATININE 0.70 0.63  CALCIUM 9.2 9.1   Liver Function Tests: Recent Labs    09/22/16 1204 04/12/17 1442  AST 23 19  ALT 15 18  ALKPHOS 147*  --   BILITOT 0.5 0.6  PROT 7.9 7.2  ALBUMIN 3.8  --    No results for input(s): LIPASE, AMYLASE in the last 8760 hours. No results for input(s): AMMONIA in the last 8760 hours. CBC: Recent Labs    06/24/16 0949 09/22/16 1204 04/12/17 1442  WBC 4.5 3.9 3.3*  NEUTROABS 2,835 2,223  --  HGB 8.0* 8.0* 12.0  HCT 27.5* 27.3* 36.6  MCV 69.1* 67.6* 85.1  PLT 419* 422* 267   Lipid Panel: Recent Labs    09/22/16 1204 04/12/17 1442  CHOL 104 92  HDL 51 42*  LDLCALC 40 35  TRIG 66 71  CHOLHDL 2.0 2.2   TSH: No results for input(s): TSH in the last 8760 hours. A1C: No results found for: HGBA1C   Assessment/Plan 1. Epistaxis -off and on, episodes are short in duration and frequently happen after she has placed fingers in nose to "clean her nose" education provided regarding not placing fingers in nose. Using plain normal saline as needed and avoiding forcefully blowing nose.   2. Saddle embolus of pulmonary artery without acute cor pulmonale, unspecified chronicity ([place]) Without signs of recurrence. pulmonary hypertension and RV strain noted and followed by cardiology, continues on xarelto at this time.   Carlos American. [place], [facility] [phone]

## 2017-06-26 ENCOUNTER — Other Ambulatory Visit: Payer: Self-pay | Admitting: Internal Medicine

## 2017-06-26 DIAGNOSIS — G2581 Restless legs syndrome: Secondary | ICD-10-CM

## 2017-07-20 ENCOUNTER — Ambulatory Visit (INDEPENDENT_AMBULATORY_CARE_PROVIDER_SITE_OTHER): Payer: PPO | Admitting: Nurse Practitioner

## 2017-07-20 ENCOUNTER — Encounter: Payer: Self-pay | Admitting: Nurse Practitioner

## 2017-07-20 VITALS — BP 142/82 | HR 63 | Temp 98.0°F | Ht 63.0 in | Wt 172.0 lb

## 2017-07-20 DIAGNOSIS — M545 Low back pain, unspecified: Secondary | ICD-10-CM

## 2017-07-20 MED ORDER — PREDNISONE 10 MG (21) PO TBPK: ORAL_TABLET | ORAL | 0 refills | Status: DC

## 2017-07-20 NOTE — Patient Instructions (Addendum)
To use prednisone as directed Use heat to lower back 3 times daily for 20-30 mins AFTER heat apply muscle rub for 1 week then as needed  Examples of muscle rubs-- Biofreeze, aspercream (with lidocaine), bengay  Can use tylenol 500 mg 1-2 tablets every 6 hours as needed for pain- not to exceed 6 tablets in 24 hours

## 2017-07-20 NOTE — Progress Notes (Signed)
Careteam: Patient Care Team: Gildardo Cranker, DO as PCP - General (Internal Medicine)  Advanced Directive information Does Patient Have a Medical Advance Directive?: Yes, Type of Advance Directive: [place];Living will  No Known Allergies  Chief Complaint  Patient presents with  . Acute Visit    Pt is being seen due to right hip to lower back pain for about a week. She reports no falls or injury.      HPI: Patient is a 77 y.o. female seen in the office today due to lower back pain started last week.  Pt with hx of pain in back before but not sure what to take for it. Getting worse every day. Sleeping in a chair which she feels like is making it worse. Could not get up after sleeping in chair. Slept in bed last night which helped. Overall since yesterday pain has improved  Pain in back is not new. Originally had severe pain in 2012 in [place] and got a shot. Shot resolved the pain but now it is back.  No injury. Reports being more active and doing more in the home and outside. Did a lot of lifting.  No numbness or tingling down legs.  Pain to right side of lower back and settles in buttocks. Feels like she has pulled a muscle.  Pain is mild currently. When is gets up to do house work it worsening the pain. 10/10 pain prior, better today.  No loss of bowel or bladder Not effecting walking/gait  Review of Systems:  Review of Systems  Constitutional: Negative for chills, fever and malaise/fatigue.  Musculoskeletal: Positive for back pain and myalgias. Negative for falls.  Skin: Negative.   Neurological: Negative for dizziness, tingling, sensory change and headaches.    Past Medical History:  Diagnosis Date  . Bladder cancer ([place])    a. s/p resection in 06/2015  . Dysuria   . Frequency of urination   . Hematuria   . Hypertension   . Nocturia   . Wears glasses   . Wears partial dentures    upper   Past Surgical History:  Procedure Laterality Date  .  CARDIAC CATHETERIZATION N/A 02/04/2016   Procedure: RIGHT/LEFT HEART CATH AND CORONARY ANGIOGRAPHY;  Surgeon: Troy Sine, MD;  Location: [facility];  Service: Cardiovascular;  Laterality: N/A;  . CYSTOSCOPY W/ RETROGRADES Bilateral 07/07/2015   Procedure: CYSTOSCOPY WITH RETROGRADE PYELOGRAM;  Surgeon: Cleon Gustin, MD;  Location: [facility];  Service: Urology;  Laterality: Bilateral;  . IR GENERIC HISTORICAL  02/07/2016   IR ANGIOGRAM SELECTIVE EACH ADDITIONAL VESSEL 02/07/2016 Jacqulynn Cadet, MD [facility]  . IR GENERIC HISTORICAL  02/07/2016   IR US GUIDE VASC ACCESS RIGHT 02/07/2016 Jacqulynn Cadet, MD [facility]  . IR GENERIC HISTORICAL  02/07/2016   IR ANGIOGRAM PULMONARY BILATERAL SELECTIVE 02/07/2016 Jacqulynn Cadet, MD [facility]  . IR GENERIC HISTORICAL  02/07/2016   IR INFUSION THROMBOL ARTERIAL INITIAL (MS) 02/07/2016 Jacqulynn Cadet, MD [facility]  . IR GENERIC HISTORICAL  02/07/2016   IR INFUSION THROMBOL ARTERIAL INITIAL (MS) 02/07/2016 Jacqulynn Cadet, MD [facility]  . IR GENERIC HISTORICAL  02/07/2016   IR ANGIOGRAM SELECTIVE EACH ADDITIONAL VESSEL 02/07/2016 Jacqulynn Cadet, MD [facility]  . NO PAST SURGERIES    . TRANSURETHRAL RESECTION OF BLADDER TUMOR WITH GYRUS (TURBT-GYRUS) N/A 07/07/2015   Procedure: TRANSURETHRAL RESECTION OF BLADDER TUMOR WITH GYRUS (TURBT-GYRUS);  Surgeon: Cleon Gustin, MD;  Location: [facility]  ;  Service: Urology;  Laterality: N/A;   Social History:   reports that she has never smoked. She has never used smokeless tobacco. She reports that she does not drink alcohol or use drugs.  Family History  Problem Relation Age of Onset  . CAD Mother 59  . Heart disease Father 101  . CAD Brother 40    Medications: Patient's Medications  New Prescriptions   No medications on file  Previous Medications   AMLODIPINE (NORVASC) 10 MG TABLET    Take 1 tablet (10 mg total) by  mouth daily.   ASCORBIC ACID (VITAMIN C) 1000 MG TABLET    Take 1,000 mg by mouth daily.   ATORVASTATIN (LIPITOR) 80 MG TABLET    TAKE 1 TABLET BY MOUTH EVERY DAY AT 6 PM   CHOLECALCIFEROL (VITAMIN D) 2000 UNITS TABLET    Take 2,000 Units by mouth daily.   DICLOFENAC SODIUM (VOLTAREN) 1 % GEL    Apply 2 g topically 4 (four) times daily. To left thumb swelling   NITROGLYCERIN (NITROSTAT) 0.4 MG SL TABLET    Place 1 tablet (0.4 mg total) under the tongue every 5 (five) minutes x 3 doses as needed for chest pain.   XARELTO 20 MG TABS TABLET    TAKE 1 TABLET EVERY DAY WITH SUPPER  Modified Medications   No medications on file  Discontinued Medications   ROPINIROLE (REQUIP) 0.5 MG TABLET    TAKE 1 TABLET AT BEDTIME     Physical Exam:  Vitals:   07/20/17 1342  BP: (!) 142/82  Pulse: 63  Temp: 98 F (36.7 C)  TempSrc: Oral  SpO2: 98%  Weight: 172 lb (78 kg)  Height: 5\' 3"  (1.6 m)   Body mass index is 30.47 kg/m.  Physical Exam  Constitutional: She is oriented to person, place, and time. She appears well-developed and well-nourished.  HENT:  Head: Normocephalic and atraumatic.  Eyes: Pupils are equal, round, and reactive to light.  Cardiovascular: Normal rate, regular rhythm and normal heart sounds.  Pulmonary/Chest: Effort normal and breath sounds normal.  Musculoskeletal:       Back:  Tenderness noted, cautious movement from sitting to laying, negative leg raises bilaterally  Neurological: She is alert and oriented to person, place, and time. She displays normal reflexes. No cranial nerve deficit or sensory deficit. She exhibits normal muscle tone. Coordination normal.    Labs reviewed: Basic Metabolic Panel: Recent Labs    09/22/16 1204 04/12/17 1442  NA 141 139  K 3.8 4.0  CL 105 106  CO2 25 27  GLUCOSE 105* 102  BUN 12 9  CREATININE 0.70 0.63  CALCIUM 9.2 9.1   Liver Function Tests: Recent Labs    09/22/16 1204 04/12/17 1442  AST 23 19  ALT 15 18    ALKPHOS 147*  --   BILITOT 0.5 0.6  PROT 7.9 7.2  ALBUMIN 3.8  --    No results for input(s): LIPASE, AMYLASE in the last 8760 hours. No results for input(s): AMMONIA in the last 8760 hours. CBC: Recent Labs    09/22/16 1204 04/12/17 1442  WBC 3.9 3.3*  NEUTROABS 2,223  --   HGB 8.0* 12.0  HCT 27.3* 36.6  MCV 67.6* 85.1  PLT 422* 267   Lipid Panel: Recent Labs    09/22/16 1204 04/12/17 1442  CHOL 104 92  HDL 51 42*  LDLCALC 40 35  TRIG 66 71  CHOLHDL 2.0 2.2   TSH: No  results for input(s): TSH in the last 8760 hours. A1C: No results found for: HGBA1C   Assessment/Plan 1. Acute right-sided low back pain without sciatica Use heat to lower back 3 times daily for 20-30 mins AFTER heat apply muscle rub for 1 week then as needed  Can use tylenol 500 mg 1-2 tablets every 6 hours as needed for pain- not to exceed 6 tablets in 24 hours  - predniSONE (STERAPRED UNI-PAK 21 TAB) 10 MG (21) TBPK tablet; Use as directed  Dispense: 21 tablet; Refill: 0  Follow up precautions discussed.  Carlos American. [place], [facility] [phone]

## 2017-08-01 ENCOUNTER — Ambulatory Visit
Admission: RE | Admit: 2017-08-01 | Discharge: 2017-08-01 | Disposition: A | Discharge status: Home / Self Care | Payer: PPO | Source: Ambulatory Visit | Attending: Nurse Practitioner | Admitting: Nurse Practitioner

## 2017-08-01 DIAGNOSIS — Z780 Asymptomatic menopausal state: Secondary | ICD-10-CM | POA: Diagnosis not present

## 2017-08-01 DIAGNOSIS — Z13820 Encounter for screening for osteoporosis: Secondary | ICD-10-CM | POA: Diagnosis not present

## 2017-08-01 DIAGNOSIS — E2839 Other primary ovarian failure: Secondary | ICD-10-CM

## 2017-08-12 ENCOUNTER — Ambulatory Visit: Payer: PPO | Admitting: Internal Medicine

## 2017-08-18 ENCOUNTER — Other Ambulatory Visit: Payer: Self-pay | Admitting: Internal Medicine

## 2017-08-31 ENCOUNTER — Ambulatory Visit (INDEPENDENT_AMBULATORY_CARE_PROVIDER_SITE_OTHER): Payer: PPO | Admitting: Internal Medicine

## 2017-08-31 ENCOUNTER — Encounter: Payer: Self-pay | Admitting: Internal Medicine

## 2017-08-31 VITALS — BP 136/80 | HR 67 | Temp 98.8°F | Ht 63.0 in | Wt 172.2 lb

## 2017-08-31 DIAGNOSIS — Z79899 Other long term (current) drug therapy: Secondary | ICD-10-CM

## 2017-08-31 DIAGNOSIS — I10 Essential (primary) hypertension: Secondary | ICD-10-CM | POA: Diagnosis not present

## 2017-08-31 DIAGNOSIS — G2581 Restless legs syndrome: Secondary | ICD-10-CM | POA: Diagnosis not present

## 2017-08-31 DIAGNOSIS — D509 Iron deficiency anemia, unspecified: Secondary | ICD-10-CM

## 2017-08-31 DIAGNOSIS — E782 Mixed hyperlipidemia: Secondary | ICD-10-CM | POA: Insufficient documentation

## 2017-08-31 MED ORDER — AMLODIPINE BESYLATE 10 MG PO TABS: 10.0000 mg | ORAL_TABLET | Freq: Every day | ORAL | 1 refills | Status: DC

## 2017-08-31 NOTE — Progress Notes (Signed)
Patient ID: Catherine Snyder, female   DOB: Jan 05, 1941, 77 y.o.   MRN: [id number]   Location:  [facility] OFFICE  Provider: DR Arletha Grippe  Code Status:  Goals of Care:  Advanced Directives 07/20/2017  Does Patient Have a Medical Advance Directive? Yes  Type of Paramedic of [place];Living will  Does patient want to make changes to medical advance directive? -  Copy of [place] in Chart? Yes  Would patient like information on creating a medical advance directive? -     Chief Complaint  Patient presents with  . Medical Management of Chronic Issues    Follow up today on blood pressure medication.    HPI: Patient is a 77 y.o. female seen today for medical management of chronic diseases. She has occasional SOB with moderate exertion. Sx's resolve with rest. She continues to care for 2 yr old great-granddaughter. She needs sample of xeralto as she is out. She reports med is "very expensive". She requests 90 day Rx of amlodipine.  Left thumb pain 2/2 primary OA - she was seen by Ortho and xray revealed advanced DJD at [facility] joint. She uses a brace prn which helps. She also applies voltaren gel which helps   RLS - stable on requip. No swelling or pain   HTN - stable on amlodipine. She takes ASA daily  Bladder cancer (dx March 2017) - s/p resection. Prior to dx, She had intermittent hematuria (passing blood clots) at end of urine stream x 1 yr. No dysuria but had increased frequency. Non smoker. Followed by urology Dr Alyson Ingles  She relocated from [place] in March 2015  CAD/hyperlipidemia - nonobstructive. Cath done 02/04/16. She takes lipitor for hyperlipidemia. She has prn SLNTG. She is on xeralto for anticoagulation. Followed by cardio Dr Claiborne Billings; LDL 35  Anemia 2/2 iron deficiency- Hgb 12. She is s/p 2 units PRBCs. Ferritin 17; iron 57  Hx PE - she had right heart strain with pulmonary HTN. She completed xeralto tx in June 2018 for PE    Past  Medical History:  Diagnosis Date  . Bladder cancer ([place])    a. s/p resection in 06/2015  . Dysuria   . Frequency of urination   . Hematuria   . Hypertension   . Nocturia   . Wears glasses   . Wears partial dentures    upper    Past Surgical History:  Procedure Laterality Date  . CARDIAC CATHETERIZATION N/A 02/04/2016   Procedure: RIGHT/LEFT HEART CATH AND CORONARY ANGIOGRAPHY;  Surgeon: Troy Sine, MD;  Location: [facility];  Service: Cardiovascular;  Laterality: N/A;  . CYSTOSCOPY W/ RETROGRADES Bilateral 07/07/2015   Procedure: CYSTOSCOPY WITH RETROGRADE PYELOGRAM;  Surgeon: Cleon Gustin, MD;  Location: [facility];  Service: Urology;  Laterality: Bilateral;  . IR GENERIC HISTORICAL  02/07/2016   IR ANGIOGRAM SELECTIVE EACH ADDITIONAL VESSEL 02/07/2016 Jacqulynn Cadet, MD [facility]  . IR GENERIC HISTORICAL  02/07/2016   IR US GUIDE VASC ACCESS RIGHT 02/07/2016 Jacqulynn Cadet, MD [facility]  . IR GENERIC HISTORICAL  02/07/2016   IR ANGIOGRAM PULMONARY BILATERAL SELECTIVE 02/07/2016 Jacqulynn Cadet, MD [facility]  . IR GENERIC HISTORICAL  02/07/2016   IR INFUSION THROMBOL ARTERIAL INITIAL (MS) 02/07/2016 Jacqulynn Cadet, MD [facility]  . IR GENERIC HISTORICAL  02/07/2016   IR INFUSION THROMBOL ARTERIAL INITIAL (MS) 02/07/2016 Jacqulynn Cadet, MD [facility]  . IR GENERIC HISTORICAL  02/07/2016   IR ANGIOGRAM  SELECTIVE EACH ADDITIONAL VESSEL 02/07/2016 Jacqulynn Cadet, MD [facility]  . NO PAST SURGERIES    . TRANSURETHRAL RESECTION OF BLADDER TUMOR WITH GYRUS (TURBT-GYRUS) N/A 07/07/2015   Procedure: TRANSURETHRAL RESECTION OF BLADDER TUMOR WITH GYRUS (TURBT-GYRUS);  Surgeon: Cleon Gustin, MD;  Location: [facility];  Service: Urology;  Laterality: N/A;     reports that she has never smoked. She has never used smokeless tobacco. She reports that she does not drink alcohol or use drugs. Social History    Socioeconomic History  . Marital status: Single    Spouse name: Not on file  . Number of children: Not on file  . Years of education: Not on file  . Highest education level: Not on file  Occupational History  . Occupation: Retired Public house manager  . Financial resource strain: Not hard at all  . Food insecurity:    Worry: Never true    Inability: Never true  . Transportation needs:    Medical: No    Non-medical: No  Tobacco Use  . Smoking status: Never Smoker  . Smokeless tobacco: Never Used  Substance and Sexual Activity  . Alcohol use: No  . Drug use: No  . Sexual activity: Never  Lifestyle  . Physical activity:    Days per week: 7 days    Minutes per session: 10 min  . Stress: Only a little  Relationships  . Social connections:    Talks on phone: More than three times a week    Gets together: More than three times a week    Attends religious service: More than 4 times per year    Active member of club or organization: No    Attends meetings of clubs or organizations: Never    Relationship status: Widowed  . Intimate partner violence:    Fear of current or ex partner: No    Emotionally abused: No    Physically abused: No    Forced sexual activity: No  Other Topics Concern  . Not on file  Social History Narrative    Diet:   Do you drink/eat things with caffeine? Yes coffee   Marital status: Divorced                              What year were you married?    Do you live in a house, apartment, assisted living, condo, trailer, etc)? House   Is it one or more stories? 1 story   How many persons live in your home? 4   Do you have any pets in your home? 0   Current or past profession: Dietitian   Do you exercise? Yes                                       Type & how often:  Walk daily   Do you have a living will? No   Do you have a DNR Form? No   Do you have a POA/HPOA forms? No    Family History  Problem Relation Age of Onset  . CAD Mother 19  .  Heart disease Father 85  . CAD Brother 22    No Known Allergies  Outpatient Encounter Medications as of 08/31/2017  Medication Sig  . amLODipine (NORVASC) 10 MG tablet Take 1 tablet (10 mg total) by mouth  daily.  . Ascorbic Acid (VITAMIN C) 1000 MG tablet Take 1,000 mg by mouth daily.  Marland Kitchen atorvastatin (LIPITOR) 80 MG tablet TAKE 1 TABLET BY MOUTH EVERY DAY AT 6 PM  . Cholecalciferol (VITAMIN D) 2000 units tablet Take 2,000 Units by mouth daily.  . diclofenac sodium (VOLTAREN) 1 % GEL Apply 2 g topically 4 (four) times daily. To left thumb swelling  . nitroGLYCERIN (NITROSTAT) 0.4 MG SL tablet Place 1 tablet (0.4 mg total) under the tongue every 5 (five) minutes x 3 doses as needed for chest pain.  Marland Kitchen XARELTO 20 MG TABS tablet TAKE 1 TABLET EVERY DAY WITH SUPPER  . [DISCONTINUED] predniSONE (STERAPRED UNI-PAK 21 TAB) 10 MG (21) TBPK tablet Use as directed (Patient not taking: Reported on 08/31/2017)   No facility-administered encounter medications on file as of 08/31/2017.     Review of Systems:  Review of Systems  Musculoskeletal: Positive for arthralgias.  All other systems reviewed and are negative.   Health Maintenance  Topic Date Due  . COLONOSCOPY  03/16/2019 (Originally 01/08/1992)  . INFLUENZA VACCINE  04/16/2019 (Originally 10/13/2017)  . TETANUS/TDAP  04/16/2019 (Originally 01/07/1961)  . MAMMOGRAM  12/10/2017  . PNA vac Low Risk Adult (2 of 2 - PPSV23) 06/14/2018  . DEXA SCAN  Completed    Physical Exam: Vitals:   08/31/17 1433  BP: 136/80  Pulse: 67  Temp: 98.8 F (37.1 C)  SpO2: 98%  Weight: 172 lb 3.2 oz (78.1 kg)  Height: _0  (1.6 m)   Body mass index is 30.5 kg/m. Physical Exam  Constitutional: She is oriented to person, place, and time. She appears well-developed and well-nourished.  HENT:  Mouth/Throat: Oropharynx is clear and moist. No oropharyngeal exudate.  MMM; no oral thrush; poor dentition  Eyes: Pupils are equal, round, and reactive to light.  No scleral icterus.  Neck: Neck supple. Carotid bruit is not present. No tracheal deviation present. No thyromegaly present.  Cardiovascular: Normal rate, regular rhythm and intact distal pulses. Exam reveals no gallop and no friction rub.  Murmur (1/6 SEM) heard. No LE edema b/l. no calf TTP.   Pulmonary/Chest: Effort normal and breath sounds normal. No stridor. No respiratory distress. She has no wheezes. She has no rales.  Abdominal: Soft. Normal appearance and bowel sounds are normal. She exhibits no distension and no mass. There is no hepatomegaly. There is no tenderness. There is no rigidity, no rebound and no guarding. No hernia.  obese  Musculoskeletal: She exhibits edema (small and large joints).  Lymphadenopathy:    She has no cervical adenopathy.  Neurological: She is alert and oriented to person, place, and time. She has normal reflexes.  Skin: Skin is warm and dry. No rash noted.  Psychiatric: She has a normal mood and affect. Her behavior is normal. Judgment and thought content normal.    Labs reviewed: Basic Metabolic Panel: Recent Labs    09/22/16 1204 04/12/17 1442  NA 141 139  K 3.8 4.0  CL 105 106  CO2 25 27  GLUCOSE 105* 102  BUN 12 9  CREATININE 0.70 0.63  CALCIUM 9.2 9.1   Liver Function Tests: Recent Labs    09/22/16 1204 04/12/17 1442  AST 23 19  ALT 15 18  ALKPHOS 147*  --   BILITOT 0.5 0.6  PROT 7.9 7.2  ALBUMIN 3.8  --    No results for input(s): LIPASE, AMYLASE in the last 8760 hours. No results for input(s): AMMONIA in the last 8760  hours. CBC: Recent Labs    09/22/16 1204 04/12/17 1442  WBC 3.9 3.3*  NEUTROABS 2,223  --   HGB 8.0* 12.0  HCT 27.3* 36.6  MCV 67.6* 85.1  PLT 422* 267   Lipid Panel: Recent Labs    09/22/16 1204 04/12/17 1442  CHOL 104 92  HDL 51 42*  LDLCALC 40 35  TRIG 66 71  CHOLHDL 2.0 2.2   No results found for: HGBA1C  Procedures since last visit: No results found.  Assessment/Plan   ICD-10-CM    1. Essential hypertension I10 amLODipine (NORVASC) 10 MG tablet    CMP with eGFR([facility])    CBC (no diff)  2. Iron deficiency anemia, unspecified iron deficiency anemia type D50.9 CBC (no diff)    Ferritin    Iron  3. Mixed hyperlipidemia E78.2 Lipid Panel    TSH  4. RLS (restless legs syndrome) G25.81 rOPINIRole (REQUIP) 0.5 MG tablet  5. High risk medication use Z79.899 CMP with eGFR([facility])   Continue current medications as ordered  Follow up with cardiology as scheduled - speak with them regarding less expensive med to keep blood thinned  Will call with lab results  Follow up in 6 mos for an extended visit (45 min) or sooner if need be   Maroa S. Perlie Gold  [facility] [address] [place], [place] [address] [phone] Cell (Monday-Friday 8 AM - 5 PM) [phone] After 5 PM and follow prompts

## 2017-08-31 NOTE — Patient Instructions (Addendum)
Continue current medications as ordered  Follow up with cardiology as scheduled - speak with them regarding less expensive med to keep blood thinned  Will call with lab results  Follow up in 6 mos for an extended visit (45 min) or sooner if need be

## 2017-09-01 LAB — COMPLETE METABOLIC PANEL WITH GFR
AG RATIO: 1.1 (calc) (ref 1.0–2.5)
ALBUMIN MSPROF: 3.9 g/dL (ref 3.6–5.1)
ALT: 22 U/L (ref 6–29)
AST: 25 U/L (ref 10–35)
Alkaline phosphatase (APISO): 141 U/L — ABNORMAL HIGH (ref 33–130)
BUN: 9 mg/dL (ref 7–25)
CHLORIDE: 106 mmol/L (ref 98–110)
CO2: 28 mmol/L (ref 20–32)
Calcium: 9.3 mg/dL (ref 8.6–10.4)
Creat: 0.73 mg/dL (ref 0.60–0.93)
GFR, EST AFRICAN AMERICAN: 93 mL/min/{1.73_m2} (ref >=60)
GFR, Est Non African American: 81 mL/min/{1.73_m2} (ref >=60)
Globulin: 3.5 g/dL (calc) (ref 1.9–3.7)
Glucose, Bld: 108 mg/dL — ABNORMAL HIGH (ref 65–99)
POTASSIUM: 3.7 mmol/L (ref 3.5–5.3)
Sodium: 142 mmol/L (ref 135–146)
TOTAL PROTEIN: 7.4 g/dL (ref 6.1–8.1)
Total Bilirubin: 0.5 mg/dL (ref 0.2–1.2)

## 2017-09-01 LAB — LIPID PANEL
Cholesterol: 94 mg/dL (ref <200)
HDL: 39 mg/dL — ABNORMAL LOW (ref >50)
LDL CHOLESTEROL (CALC): 36 mg/dL
NON-HDL CHOLESTEROL (CALC): 55 mg/dL (ref <130)
Total CHOL/HDL Ratio: 2.4 (calc) (ref <5.0)
Triglycerides: 104 mg/dL (ref <150)

## 2017-09-01 LAB — CBC
HCT: 37.2 % (ref 35.0–45.0)
Hemoglobin: 12.2 g/dL (ref 11.7–15.5)
MCH: 28.4 pg (ref 27.0–33.0)
MCHC: 32.8 g/dL (ref 32.0–36.0)
MCV: 86.7 fL (ref 80.0–100.0)
MPV: 10.1 fL (ref 7.5–12.5)
Platelets: 272 10*3/uL (ref 140–400)
RBC: 4.29 10*6/uL (ref 3.80–5.10)
RDW: 15 % (ref 11.0–15.0)
WBC: 3.8 10*3/uL (ref 3.8–10.8)

## 2017-09-01 LAB — IRON: IRON: 62 ug/dL (ref 45–160)

## 2017-09-01 LAB — TSH: TSH: 1.71 m[IU]/L (ref 0.40–4.50)

## 2017-09-01 LAB — FERRITIN: Ferritin: 13 ng/mL — ABNORMAL LOW (ref 16–288)

## 2017-09-19 ENCOUNTER — Telehealth: Payer: Self-pay | Admitting: Cardiovascular Disease

## 2017-09-19 NOTE — Telephone Encounter (Signed)
Xarelto 20 mg #2 lot #[id number] exp 6/21 left at front for pick up. Daughter aware. Did scheduled follow up with Arnold Long DNP

## 2017-09-19 NOTE — Telephone Encounter (Signed)
Patient calling the office for samples of medication: ° ° °1.  What medication and dosage are you requesting samples for? Xarelto  ° °2.  Are you currently out of this medication? yes ° ° °

## 2017-10-03 NOTE — Progress Notes (Signed)
Cardiology Office Note   Date:  10/06/2017   ID:  Catherine Snyder, DOB 1940-11-16, MRN [id number]  PCP:  Gildardo Cranker, DO  Cardiologist:  Dr. Claiborne Billings  Chief Complaint  Patient presents with  . Follow-up  . Hypertension  . Coronary Artery Disease    40% LAD     History of Present Illness: Catherine Snyder is a 77 y.o. female who presents for ongoing assessment of CAD with 40% LAD per cath in  2017, pulmonary hypertension, chronic DOE, hypertension, hx of PE and saddle embolism,  requiring thrombolysis,on anticoagulation with Xarelto, with other history of iron deficiency anemia, bladder CA, s/p radiation and surgery.   She is without complaints today. Breathing status is at baseline.She sometimes has DOE but recovers quickly with rest. She denies excessive bruising or bleeding. She has had episodes in the past of epistaxis but this has resolved with moisturizing nasal spray.   Past Medical History:  Diagnosis Date  . Bladder cancer ([place])    a. s/p resection in 06/2015  . Dysuria   . Frequency of urination   . Hematuria   . Hypertension   . Nocturia   . Wears glasses   . Wears partial dentures    upper    Past Surgical History:  Procedure Laterality Date  . CARDIAC CATHETERIZATION N/A 02/04/2016   Procedure: RIGHT/LEFT HEART CATH AND CORONARY ANGIOGRAPHY;  Surgeon: Catherine Sine, MD;  Location: [facility];  Service: Cardiovascular;  Laterality: N/A;  . CYSTOSCOPY W/ RETROGRADES Bilateral 07/07/2015   Procedure: CYSTOSCOPY WITH RETROGRADE PYELOGRAM;  Surgeon: Catherine Gustin, MD;  Location: [facility];  Service: Urology;  Laterality: Bilateral;  . IR GENERIC HISTORICAL  02/07/2016   IR ANGIOGRAM SELECTIVE EACH ADDITIONAL VESSEL 02/07/2016 Catherine Cadet, MD [facility]  . IR GENERIC HISTORICAL  02/07/2016   IR US GUIDE VASC ACCESS RIGHT 02/07/2016 Catherine Cadet, MD [facility]  . IR GENERIC HISTORICAL  02/07/2016   IR ANGIOGRAM PULMONARY  BILATERAL SELECTIVE 02/07/2016 Catherine Cadet, MD [facility]  . IR GENERIC HISTORICAL  02/07/2016   IR INFUSION THROMBOL ARTERIAL INITIAL (MS) 02/07/2016 Catherine Cadet, MD [facility]  . IR GENERIC HISTORICAL  02/07/2016   IR INFUSION THROMBOL ARTERIAL INITIAL (MS) 02/07/2016 Catherine Cadet, MD [facility]  . IR GENERIC HISTORICAL  02/07/2016   IR ANGIOGRAM SELECTIVE EACH ADDITIONAL VESSEL 02/07/2016 Catherine Cadet, MD [facility]  . NO PAST SURGERIES    . TRANSURETHRAL RESECTION OF BLADDER TUMOR WITH GYRUS (TURBT-GYRUS) N/A 07/07/2015   Procedure: TRANSURETHRAL RESECTION OF BLADDER TUMOR WITH GYRUS (TURBT-GYRUS);  Surgeon: Catherine Gustin, MD;  Location: [facility];  Service: Urology;  Laterality: N/A;     Current Outpatient Medications  Medication Sig Dispense Refill  . amLODipine (NORVASC) 10 MG tablet Take 1 tablet (10 mg total) by mouth daily. 90 tablet 1  . Ascorbic Acid (VITAMIN C) 1000 MG tablet Take 1,000 mg by mouth daily.    Marland Kitchen atorvastatin (LIPITOR) 80 MG tablet TAKE 1 TABLET BY MOUTH EVERY DAY AT 6 PM 90 tablet 1  . Cholecalciferol (VITAMIN D) 2000 units tablet Take 2,000 Units by mouth daily.    . diclofenac sodium (VOLTAREN) 1 % GEL Apply 2 g topically 4 (four) times daily. To left thumb swelling 100 g 6  . nitroGLYCERIN (NITROSTAT) 0.4 MG SL tablet Place 1 tablet (0.4 mg total) under the tongue every 5 (five) minutes x 3 doses as needed for chest pain. [name]  tablet 0  . rOPINIRole (REQUIP) 0.5 MG tablet Take 0.5 mg by mouth at bedtime.  5  . XARELTO 20 MG TABS tablet TAKE 1 TABLET EVERY DAY WITH SUPPER 30 tablet 6   No current facility-administered medications for this visit.     Allergies:   Patient has no known allergies.    Social History:  The patient  reports that she has never smoked. She has never used smokeless tobacco. She reports that she does not drink alcohol or use drugs.   Family History:  The patient's family history  includes CAD (age of onset: 33) in her brother; CAD (age of onset: 70) in her mother; Heart disease (age of onset: 60) in her father.    ROS: All other systems are reviewed and negative. Unless otherwise mentioned in H&P    PHYSICAL EXAM: VS:  BP 124/64   Pulse 63   Ht '5\' 3"'  (1.6 m)   Wt 174 lb (78.9 kg)   SpO2 97%   BMI 30.82 kg/m  , BMI Body mass index is 30.82 kg/m. GEN: Well nourished, well developed, in no acute distress  HEENT: normal  Neck: no JVD, soft left carotid bruits, or masses Cardiac: RRR; 1/6 systolicmurmurs, rubs, or gallops,no edema  Respiratory:  clear to auscultation bilaterally, normal work of breathing GI: soft, nontender, nondistended, + BS MS: no deformity or atrophy  Skin: warm and dry, no rash Neuro:  Strength and sensation are intact Psych: euthymic mood, full affect   EKG:  NSR rate of 61 bpm.   Recent Labs: 08/31/2017: ALT 22; BUN 9; Creat 0.73; Hemoglobin 12.2; Platelets 272; Potassium 3.7; Sodium 142; TSH 1.71    Lipid Panel    Component Value Date/Time   CHOL 94 08/31/2017 1510   CHOL 182 11/27/2014 1018   TRIG 104 08/31/2017 1510   HDL 39 (L) 08/31/2017 1510   HDL 43 11/27/2014 1018   CHOLHDL 2.4 08/31/2017 1510   VLDL 13 09/22/2016 1204   LDLCALC 36 08/31/2017 1510      Wt Readings from Last 3 Encounters:  10/06/17 174 lb (78.9 kg)  08/31/17 172 lb 3.2 oz (78.1 kg)  07/20/17 172 lb (78 kg)      Other studies Reviewed: Echocardiogram 2017/06/23 Left ventricle: The cavity size was normal. Wall thickness was   normal. Systolic function was normal. The estimated ejection   fraction was in the range of 55% to 60%. Doppler parameters are   consistent with abnormal left ventricular relaxation (grade 1   diastolic dysfunction). - Aortic valve: Calcified left coronary cusp and commisure There   was mild regurgitation. Valve area (VTI): 2.39 cm^2. Valve area   (Vmax): 2.41 cm^2. Valve area (Vmean): 2.32 cm^2. - Mitral valve: There  was mild regurgitation. - Left atrium: The atrium was moderately dilated. - Right atrium: The atrium was mildly dilated. - Atrial septum: A patent foramen ovale cannot be excluded. - Pericardium, extracardiac: A trivial pericardial effusion was   identified posterior to the heart.  Cardiac Cath 02/02/2016    Mid LAD lesion, 40 %stenosed.  Hemodynamic findings consistent with pulmonary hypertension.   Low normal to mild LV dysfunction with an ejection fraction of 50%.  There is left ventricular hypertrophy and a suggestion of subtle mild mid anterolateral hypocontractility.  Mild nonobstructive CAD with evidence for a 40% LAD.  She stenosis in the region of calcification in the LAD immediately after these septal perforator and diagonal vessel; normal left circumflex and normal RCA.  Dilated aortic root.  RECOMMENDATION: The patient has experienced significant dyspnea with exertion over the past several weeks which is new. Pulmonary pressures are elevated with  RV/PA systolic in the 00B.   She has normal left ventricular end-diastolic pressure.  I suspect the patient's dyspnea is secondary to her pulmonary hypertension     ASSESSMENT AND PLAN:  1. CAD: Most recent cardiac cath in 2017 demonstrated 40% LAD stenosis with normal Cx and RCA. She continues on secondary prevention with statin therapy. She has not had any symptoms of chest pain or fatigue.    No ASA as she is on DOAC                2. Hypertension: BP is well controlled currently. She will remain on amlodipine.  Current medicines are reviewed at length with the patient today.    3. Hx of Saddle Embolus: She remains on Xarelto. She requests samples as she has not met her co-pay and the cost is too high for her to afford.   4. Hypercholesterolemia: Continue statin therapy. PCP is following labs.   Labs/ tests ordered today include: None  Phill Myron. West Pugh, ANP, AACC   10/06/2017 8:51 AM    [facility] [address] [place], [place] [address] Phone: [phone]; Fax: [phone]

## 2017-10-04 ENCOUNTER — Encounter: Payer: Self-pay | Admitting: Internal Medicine

## 2017-10-06 ENCOUNTER — Encounter: Payer: Self-pay | Admitting: Adult Health

## 2017-10-06 ENCOUNTER — Ambulatory Visit (INDEPENDENT_AMBULATORY_CARE_PROVIDER_SITE_OTHER): Payer: PPO | Admitting: Adult Health

## 2017-10-06 VITALS — BP 124/64 | HR 63 | Ht 63.0 in | Wt 174.0 lb

## 2017-10-06 DIAGNOSIS — E7800 Pure hypercholesterolemia, unspecified: Secondary | ICD-10-CM

## 2017-10-06 DIAGNOSIS — I2699 Other pulmonary embolism without acute cor pulmonale: Secondary | ICD-10-CM

## 2017-10-06 DIAGNOSIS — I2510 Atherosclerotic heart disease of native coronary artery without angina pectoris: Secondary | ICD-10-CM | POA: Diagnosis not present

## 2017-10-06 DIAGNOSIS — Z7901 Long term (current) use of anticoagulants: Secondary | ICD-10-CM

## 2017-10-06 DIAGNOSIS — I10 Essential (primary) hypertension: Secondary | ICD-10-CM

## 2017-10-06 NOTE — Patient Instructions (Signed)
Medication Instructions:  NO CHANGES- Your physician recommends that you continue on your current medications as directed. Please refer to the Current Medication list given to you today.  If you need a refill on your cardiac medications before your next appointment, please call your pharmacy.  Follow-Up: Your physician wants you to follow-up in: 6 months with Dr Trellis Moment should receive a reminder letter in the mail two months in advance. If you do not receive a letter, please call our office Nov 2019 to schedule the Jan 2020 follow-up appointment.   Thank you for choosing [facility] at [facility]!!

## 2017-10-25 ENCOUNTER — Other Ambulatory Visit: Payer: Self-pay | Admitting: Internal Medicine

## 2017-10-25 DIAGNOSIS — G2581 Restless legs syndrome: Secondary | ICD-10-CM

## 2017-11-02 ENCOUNTER — Encounter: Payer: Self-pay | Admitting: Internal Medicine

## 2017-11-17 ENCOUNTER — Telehealth: Payer: Self-pay | Admitting: Cardiovascular Disease

## 2017-11-17 NOTE — Telephone Encounter (Signed)
Returned call to patient Xarelto 20 mg samples left at [facility] office front desk. 

## 2017-11-17 NOTE — Telephone Encounter (Signed)
New Message:    Patient calling the office for samples of medication:   1.  What medication and dosage are you requesting samples for?XARELTO 20 MG TABS tablet  2.  Are you currently out of this medication? Yes

## 2017-12-12 ENCOUNTER — Telehealth: Payer: Self-pay | Admitting: Cardiovascular Disease

## 2017-12-12 NOTE — Telephone Encounter (Signed)
Spoke with pt daughter Jeanett Schlein. The pt is currently out of Xarelto and is in need of samples. Adv her that I do not see where a recent script was sent in and we cannot rely on samples. Jeanett Schlein sts that the pt cannot afford the prescription it is $400 for a monthly supply. They have never applied for patient assistance. Aldean Jewett that I will place a patient assistance form in the bag with the Xarelto samples and leave it at the front desk for pick up. Adv her that they are to complete the form in its entirety and return as instructed in the form. [facility] voiced appreciation for the assistance.

## 2017-12-12 NOTE — Telephone Encounter (Signed)
New Message: ° ° ° ° ° °Patient calling the office for samples of medication: ° ° °1.  What medication and dosage are you requesting samples for? Xarelto ° °2.  Are you currently out of this medication? yes ° ° ° °

## 2018-01-23 ENCOUNTER — Other Ambulatory Visit: Payer: Self-pay | Admitting: *Deleted

## 2018-01-23 DIAGNOSIS — G2581 Restless legs syndrome: Secondary | ICD-10-CM

## 2018-01-23 MED ORDER — ROPINIROLE HCL 0.5 MG PO TABS: 0.5000 mg | ORAL_TABLET | Freq: Every day | ORAL | 0 refills | Status: DC

## 2018-01-23 NOTE — Telephone Encounter (Signed)
[facility] 

## 2018-02-15 ENCOUNTER — Other Ambulatory Visit: Payer: Self-pay | Admitting: *Deleted

## 2018-02-15 MED ORDER — RIVAROXABAN 20 MG PO TABS: ORAL_TABLET | ORAL | 1 refills | Status: DC

## 2018-02-15 NOTE — Telephone Encounter (Signed)
Catherine Snyder, daughter requested refill.

## 2018-02-27 ENCOUNTER — Other Ambulatory Visit: Payer: Self-pay

## 2018-02-27 DIAGNOSIS — I10 Essential (primary) hypertension: Secondary | ICD-10-CM

## 2018-02-27 MED ORDER — AMLODIPINE BESYLATE 10 MG PO TABS: 10.0000 mg | ORAL_TABLET | Freq: Every day | ORAL | 1 refills | Status: DC

## 2018-02-27 MED ORDER — ATORVASTATIN CALCIUM 80 MG PO TABS: ORAL_TABLET | ORAL | 1 refills | Status: DC

## 2018-03-09 ENCOUNTER — Other Ambulatory Visit: Payer: Self-pay | Admitting: Nurse Practitioner

## 2018-03-10 ENCOUNTER — Encounter: Payer: PPO | Admitting: Nurse Practitioner

## 2018-03-10 ENCOUNTER — Encounter: Payer: PPO | Admitting: Internal Medicine

## 2018-03-17 ENCOUNTER — Other Ambulatory Visit: Payer: Self-pay

## 2018-03-17 MED ORDER — RIVAROXABAN 20 MG PO TABS: ORAL_TABLET | ORAL | 0 refills | Status: DC

## 2018-03-24 ENCOUNTER — Other Ambulatory Visit: Payer: Self-pay | Admitting: *Deleted

## 2018-03-24 MED ORDER — ATORVASTATIN CALCIUM 80 MG PO TABS: ORAL_TABLET | ORAL | 0 refills | Status: DC

## 2018-03-24 NOTE — Telephone Encounter (Signed)
[facility] 

## 2018-04-10 ENCOUNTER — Encounter: Payer: PPO | Admitting: Nurse Practitioner

## 2018-04-11 ENCOUNTER — Other Ambulatory Visit: Payer: Self-pay | Admitting: Nurse Practitioner

## 2018-04-21 ENCOUNTER — Ambulatory Visit: Payer: PPO | Admitting: Nurse Practitioner

## 2018-05-02 ENCOUNTER — Other Ambulatory Visit: Payer: Self-pay | Admitting: Nurse Practitioner

## 2018-05-02 DIAGNOSIS — G2581 Restless legs syndrome: Secondary | ICD-10-CM

## 2018-05-11 ENCOUNTER — Other Ambulatory Visit: Payer: Self-pay | Admitting: Nurse Practitioner

## 2018-06-07 ENCOUNTER — Other Ambulatory Visit: Payer: Self-pay | Admitting: Nurse Practitioner

## 2018-06-16 ENCOUNTER — Encounter: Payer: PPO | Admitting: Family

## 2018-06-16 ENCOUNTER — Encounter: Payer: Self-pay | Admitting: Family

## 2018-06-16 ENCOUNTER — Ambulatory Visit: Payer: Self-pay

## 2018-06-30 ENCOUNTER — Other Ambulatory Visit: Payer: Self-pay | Admitting: Nurse Practitioner

## 2018-07-04 ENCOUNTER — Other Ambulatory Visit: Payer: Self-pay

## 2018-07-04 ENCOUNTER — Encounter: Payer: Self-pay | Admitting: Nurse Practitioner

## 2018-07-04 ENCOUNTER — Ambulatory Visit (INDEPENDENT_AMBULATORY_CARE_PROVIDER_SITE_OTHER): Payer: PPO | Admitting: Nurse Practitioner

## 2018-07-04 DIAGNOSIS — I2699 Other pulmonary embolism without acute cor pulmonale: Secondary | ICD-10-CM

## 2018-07-04 DIAGNOSIS — E782 Mixed hyperlipidemia: Secondary | ICD-10-CM

## 2018-07-04 DIAGNOSIS — Z7901 Long term (current) use of anticoagulants: Secondary | ICD-10-CM

## 2018-07-04 DIAGNOSIS — M1812 Unilateral primary osteoarthritis of first carpometacarpal joint, left hand: Secondary | ICD-10-CM

## 2018-07-04 DIAGNOSIS — G2581 Restless legs syndrome: Secondary | ICD-10-CM | POA: Diagnosis not present

## 2018-07-04 DIAGNOSIS — D509 Iron deficiency anemia, unspecified: Secondary | ICD-10-CM

## 2018-07-04 DIAGNOSIS — Z1231 Encounter for screening mammogram for malignant neoplasm of breast: Secondary | ICD-10-CM | POA: Diagnosis not present

## 2018-07-04 DIAGNOSIS — I10 Essential (primary) hypertension: Secondary | ICD-10-CM

## 2018-07-04 NOTE — Progress Notes (Signed)
This service is provided via telemedicine  No vital signs collected/recorded due to the encounter was a telemedicine visit.   Location of patient (ex: home, work):  Home  Patient consents to a telephone visit: Yes  Location of the provider (ex: office, home):  [facility], Office   Name of any referring provider: N/A  Names of all persons participating in the telemedicine service and their role in the encounter:  S.Chrae B/CMA, Sherrie Mustache, NP, and Patient   Time spent on call:  5 min with medical assistant   Virtual Visit via Telephone Note  I connected with Catherine Snyder on 07/04/18 at 12:00 PM EDT by telephone and verified that I am speaking with the correct person using two identifiers.   I discussed the limitations, risks, security and privacy concerns of performing an evaluation and management service by telephone and the availability of in person appointments. I also discussed with the patient that there may be a patient responsible charge related to this service. The patient expressed understanding and agreed to proceed.      Careteam: Patient Care Team: Lauree Chandler, NP as PCP - General (Geriatric Medicine)  Advanced Directive information Does Patient Have a Medical Advance Directive?: No, Would patient like information on creating a medical advance directive?: No - Patient declined  No Known Allergies  Chief Complaint  Patient presents with  . Acute Visit    Pain in groin x 1 day (2days ago), pain resolved. Patient thinks pain related to a dropped uterus.      HPI: Patient is a 78 y.o. female due to pain in lower abdomen. Lasted part of a day (2 days ago) and has currently resolved. No longer having pain.  She is moving bowels good. BM daily.  No abdominal pain currently.  No problems with urination. No increase in frequency   OA- ongoing pain in her hands. Uses tylenol as needed for this. Occasionally will need voltaren gel.    Hyperlipidemia- taking Lipitor 80 mg by mouth daily. LDL 36 in June 2019.   RLS- controlled with ropinirole 0.5 mg by mouth at bedtime.   htn- does not take blood pressure at home, currently on Norvasc 10 mg daily   Hx of bladder cancer- s/p resection and followed by urologist-  CAD- on xarelto therefore she does not take ASA, no chest pains.   Hx of PE- continues on xarelto.   Review of Systems:  Review of Systems  Constitutional: Negative for chills, fever and weight loss.  HENT: Negative for tinnitus.   Respiratory: Negative for cough, sputum production and shortness of breath.   Cardiovascular: Negative for chest pain, palpitations and leg swelling.  Gastrointestinal: Negative for abdominal pain, constipation, diarrhea and heartburn.  Genitourinary: Negative for dysuria, frequency and urgency.  Musculoskeletal: Negative for back pain, falls, joint pain and myalgias.  Skin: Negative.   Neurological: Negative for dizziness and headaches.  Psychiatric/Behavioral: Negative for depression and memory loss. The patient does not have insomnia.     Past Medical History:  Diagnosis Date  . Bladder cancer ([place])    a. s/p resection in 06/2015  . Dysuria   . Frequency of urination   . Hematuria   . Hypertension   . Nocturia   . Wears glasses   . Wears partial dentures    upper   Past Surgical History:  Procedure Laterality Date  . CARDIAC CATHETERIZATION N/A 02/04/2016   Procedure: RIGHT/LEFT HEART CATH AND CORONARY ANGIOGRAPHY;  Surgeon: Troy Sine, MD;  Location: [place];  Service: Cardiovascular;  Laterality: N/A;  . CYSTOSCOPY W/ RETROGRADES Bilateral 07/07/2015   Procedure: CYSTOSCOPY WITH RETROGRADE PYELOGRAM;  Surgeon: Cleon Gustin, MD;  Location: [facility];  Service: Urology;  Laterality: Bilateral;  . IR GENERIC HISTORICAL  02/07/2016   IR ANGIOGRAM SELECTIVE EACH ADDITIONAL VESSEL 02/07/2016 Jacqulynn Cadet, MD [facility]  . IR  GENERIC HISTORICAL  02/07/2016   IR US GUIDE VASC ACCESS RIGHT 02/07/2016 Jacqulynn Cadet, MD [facility]  . IR GENERIC HISTORICAL  02/07/2016   IR ANGIOGRAM PULMONARY BILATERAL SELECTIVE 02/07/2016 Jacqulynn Cadet, MD [facility]  . IR GENERIC HISTORICAL  02/07/2016   IR INFUSION THROMBOL ARTERIAL INITIAL (MS) 02/07/2016 Jacqulynn Cadet, MD [facility]  . IR GENERIC HISTORICAL  02/07/2016   IR INFUSION THROMBOL ARTERIAL INITIAL (MS) 02/07/2016 Jacqulynn Cadet, MD [facility]  . IR GENERIC HISTORICAL  02/07/2016   IR ANGIOGRAM SELECTIVE EACH ADDITIONAL VESSEL 02/07/2016 Jacqulynn Cadet, MD [facility]  . NO PAST SURGERIES    . TRANSURETHRAL RESECTION OF BLADDER TUMOR WITH GYRUS (TURBT-GYRUS) N/A 07/07/2015   Procedure: TRANSURETHRAL RESECTION OF BLADDER TUMOR WITH GYRUS (TURBT-GYRUS);  Surgeon: Cleon Gustin, MD;  Location: [facility];  Service: Urology;  Laterality: N/A;   Social History:   reports that she has never smoked. She has never used smokeless tobacco. She reports that she does not drink alcohol or use drugs.  Family History  Problem Relation Age of Onset  . CAD Mother 42  . Heart disease Father 75  . CAD Brother 40    Medications: Patient's Medications  New Prescriptions   No medications on file  Previous Medications   AMLODIPINE (NORVASC) 10 MG TABLET    Take 1 tablet (10 mg total) by mouth daily.   ASCORBIC ACID (VITAMIN C) 1000 MG TABLET    Take 1,000 mg by mouth daily.   ATORVASTATIN (LIPITOR) 80 MG TABLET    Take one tablet by mouth once daily at 6pm   CHOLECALCIFEROL (VITAMIN D) 2000 UNITS TABLET    Take 2,000 Units by mouth daily.   DICLOFENAC SODIUM (VOLTAREN) 1 % GEL    Apply 2 g topically 4 (four) times daily. To left thumb swelling   NITROGLYCERIN (NITROSTAT) 0.4 MG SL TABLET    Place 1 tablet (0.4 mg total) under the tongue every 5 (five) minutes x 3 doses as needed for chest pain.   ROPINIROLE (REQUIP) 0.5 MG TABLET     TAKE 1 TABLET (0.5 MG TOTAL) BY MOUTH AT BEDTIME.   XARELTO 20 MG TABS TABLET    TAKE ONE TABLET BY MOUTH ONCE DAILY WITH SUPPER  Modified Medications   No medications on file  Discontinued Medications   No medications on file     Physical Exam: unable due to tele-visit.     Labs reviewed: Basic Metabolic Panel: Recent Labs    08/31/17 1510  NA 142  K 3.7  CL 106  CO2 28  GLUCOSE 108*  BUN 9  CREATININE 0.73  CALCIUM 9.3  TSH 1.71   Liver Function Tests: Recent Labs    08/31/17 1510  AST 25  ALT 22  BILITOT 0.5  PROT 7.4   No results for input(s): LIPASE, AMYLASE in the last 8760 hours. No results for input(s): AMMONIA in the last 8760 hours. CBC: Recent Labs    08/31/17 1510  WBC 3.8  HGB 12.2  HCT 37.2  MCV  86.7  PLT 272   Lipid Panel: Recent Labs    08/31/17 1510  CHOL 94  HDL 39*  LDLCALC 36  TRIG 104  CHOLHDL 2.4   TSH: Recent Labs    08/31/17 1510  TSH 1.71   A1C: No results found for: HGBA1C   Assessment/Plan 1. Encounter for screening mammogram for breast cancer - MM DIGITAL SCREENING BILATERAL; Future  2. Pulmonary embolism on long-term anticoagulation therapy ([place]) -without signs of recurrence. Continues on xarelto.  - COMPLETE METABOLIC PANEL WITH GFR; Future - CBC with Differential/Platelet; Future  3. Mixed hyperlipidemia conts on Lipitor with diet modifications - COMPLETE METABOLIC PANEL WITH GFR; Future - Lipid panel; Future  4. Primary osteoarthritis of first carpometacarpal joint of left hand Stable, continues with tylenol as needed and Voltaren gel.   5. RLS (restless legs syndrome) Controlled on requip  6. Iron deficiency anemia, unspecified iron deficiency anemia type -will follow up lab.  - CBC with Differential/Platelet; Future  7. Essential hypertension To check blood pressure at home to monitor. sbp <140/90, continues on norvasc 10 mg daily   Next appt: to schedule fasting lab work. appt pending  for EV and AWV  Catherine Snyder K. Harle Battiest  [facility] [phone]    Follow Up Instructions:    I discussed the assessment and treatment plan with the patient. The patient was provided an opportunity to ask questions and all were answered. The patient agreed with the plan and demonstrated an understanding of the instructions.   The patient was advised to call back or seek an in-person evaluation if the symptoms worsen or if the condition fails to improve as anticipated.  I provided 11 minutes of non-face-to-face time during this encounter.   AVS printed and mailed.  Lauree Chandler, NP

## 2018-07-12 ENCOUNTER — Other Ambulatory Visit: Payer: Self-pay

## 2018-07-12 ENCOUNTER — Other Ambulatory Visit: Payer: PPO

## 2018-07-12 DIAGNOSIS — E782 Mixed hyperlipidemia: Secondary | ICD-10-CM

## 2018-07-12 DIAGNOSIS — I2699 Other pulmonary embolism without acute cor pulmonale: Secondary | ICD-10-CM

## 2018-07-12 DIAGNOSIS — D509 Iron deficiency anemia, unspecified: Secondary | ICD-10-CM | POA: Diagnosis not present

## 2018-07-12 DIAGNOSIS — Z7901 Long term (current) use of anticoagulants: Secondary | ICD-10-CM | POA: Diagnosis not present

## 2018-07-12 LAB — CBC WITH DIFFERENTIAL/PLATELET
Absolute Monocytes: 596 cells/uL (ref 200–950)
Basophils Absolute: 20 cells/uL (ref 0–200)
Basophils Relative: 0.5 %
Eosinophils Absolute: 80 cells/uL (ref 15–500)
Eosinophils Relative: 2 %
HCT: 37.4 % (ref 35.0–45.0)
Hemoglobin: 12.3 g/dL (ref 11.7–15.5)
Lymphs Abs: 1472 cells/uL (ref 850–3900)
MCH: 29.1 pg (ref 27.0–33.0)
MCHC: 32.9 g/dL (ref 32.0–36.0)
MCV: 88.4 fL (ref 80.0–100.0)
MPV: 10.3 fL (ref 7.5–12.5)
Monocytes Relative: 14.9 %
Neutro Abs: 1832 cells/uL (ref 1500–7800)
Neutrophils Relative %: 45.8 %
Platelets: 276 10*3/uL (ref 140–400)
RBC: 4.23 10*6/uL (ref 3.80–5.10)
RDW: 14.2 % (ref 11.0–15.0)
Total Lymphocyte: 36.8 %
WBC: 4 10*3/uL (ref 3.8–10.8)

## 2018-07-12 LAB — COMPLETE METABOLIC PANEL WITH GFR
AG Ratio: 1.1 (calc) (ref 1.0–2.5)
ALT: 21 U/L (ref 6–29)
AST: 28 U/L (ref 10–35)
Albumin: 4.1 g/dL (ref 3.6–5.1)
Alkaline phosphatase (APISO): 130 U/L (ref 37–153)
BUN: 8 mg/dL (ref 7–25)
CO2: 26 mmol/L (ref 20–32)
Calcium: 9.4 mg/dL (ref 8.6–10.4)
Chloride: 106 mmol/L (ref 98–110)
Creat: 0.69 mg/dL (ref 0.60–0.93)
GFR, Est African American: 98 mL/min/{1.73_m2} (ref >=60)
GFR, Est Non African American: 85 mL/min/{1.73_m2} (ref >=60)
Globulin: 3.6 g/dL (calc) (ref 1.9–3.7)
Glucose, Bld: 122 mg/dL — ABNORMAL HIGH (ref 65–99)
Potassium: 4 mmol/L (ref 3.5–5.3)
Sodium: 141 mmol/L (ref 135–146)
Total Bilirubin: 0.5 mg/dL (ref 0.2–1.2)
Total Protein: 7.7 g/dL (ref 6.1–8.1)

## 2018-07-12 LAB — LIPID PANEL
Cholesterol: 97 mg/dL (ref <200)
HDL: 36 mg/dL — ABNORMAL LOW (ref >=50)
LDL Cholesterol (Calc): 46 mg/dL (calc)
Non-HDL Cholesterol (Calc): 61 mg/dL (calc) (ref <130)
Total CHOL/HDL Ratio: 2.7 (calc) (ref <5.0)
Triglycerides: 71 mg/dL (ref <150)

## 2018-07-14 ENCOUNTER — Encounter: Payer: PPO | Admitting: Family

## 2018-07-14 ENCOUNTER — Encounter: Payer: Self-pay | Admitting: Family

## 2018-07-14 ENCOUNTER — Ambulatory Visit (INDEPENDENT_AMBULATORY_CARE_PROVIDER_SITE_OTHER): Payer: PPO | Admitting: Family

## 2018-07-14 ENCOUNTER — Other Ambulatory Visit: Payer: Self-pay

## 2018-07-14 DIAGNOSIS — I10 Essential (primary) hypertension: Secondary | ICD-10-CM | POA: Diagnosis not present

## 2018-07-14 DIAGNOSIS — Z0000 Encounter for general adult medical examination without abnormal findings: Secondary | ICD-10-CM | POA: Diagnosis not present

## 2018-07-14 DIAGNOSIS — M79645 Pain in left finger(s): Secondary | ICD-10-CM

## 2018-07-14 DIAGNOSIS — G8929 Other chronic pain: Secondary | ICD-10-CM

## 2018-07-14 MED ORDER — DICLOFENAC SODIUM 1 % TD GEL: 2.0000 g | Freq: Four times a day (QID) | TRANSDERMAL | 6 refills | Status: DC

## 2018-07-14 MED ORDER — AMLODIPINE BESYLATE 10 MG PO TABS: 10.0000 mg | ORAL_TABLET | Freq: Every day | ORAL | 1 refills | Status: DC

## 2018-07-14 NOTE — Patient Instructions (Signed)
Catherine Snyder , Thank you for taking time to come for your Medicare Wellness Visit. I appreciate your ongoing commitment to your health goals. Please review the following plan we discussed and let me know if I can assist you in the future.   Screening recommendations/referrals: Colonoscopy : Up to date  Mammogram: up to date  Bone Density: Up to date  Recommended yearly ophthalmology/optometry visit for glaucoma screening and checkup Recommended yearly dental visit for hygiene and checkup  Vaccinations: Influenza vaccine: declined  Pneumococcal vaccine: up to date  Tdap vaccine: due 04/16/2019  Shingles vaccine: Decline     Advanced directives: yes   Conditions/risks identified: advanced age >21 years,Hypertension   Next appointment: 1 year   Preventive Care 25 Years and Older, Female Preventive care refers to lifestyle choices and visits with your health care provider that can promote health and wellness. What does preventive care include?  A yearly physical exam. This is also called an annual well check.  Dental exams once or twice a year.  Routine eye exams. Ask your health care provider how often you should have your eyes checked.  Personal lifestyle choices, including:  Daily care of your teeth and gums.  Regular physical activity.  Eating a healthy diet.  Avoiding tobacco and drug use.  Limiting alcohol use.  Practicing safe sex.  Taking low-dose aspirin every day.  Taking vitamin and mineral supplements as recommended by your health care provider. What happens during an annual well check? The services and screenings done by your health care provider during your annual well check will depend on your age, overall health, lifestyle risk factors, and family history of disease. Counseling  Your health care provider may ask you questions about your:  Alcohol use.  Tobacco use.  Drug use.  Emotional well-being.  Home and relationship well-being.  Sexual  activity.  Eating habits.  History of falls.  Memory and ability to understand (cognition).  Work and work Statistician.  Reproductive health. Screening  You may have the following tests or measurements:  Height, weight, and BMI.  Blood pressure.  Lipid and cholesterol levels. These may be checked every 5 years, or more frequently if you are over 52 years old.  Skin check.  Lung cancer screening. You may have this screening every year starting at age 53 if you have a 30-pack-year history of smoking and currently smoke or have quit within the past 15 years.  Fecal occult blood test (FOBT) of the stool. You may have this test every year starting at age 54.  Flexible sigmoidoscopy or colonoscopy. You may have a sigmoidoscopy every 5 years or a colonoscopy every 10 years starting at age 26.  Hepatitis C blood test.  Hepatitis B blood test.  Sexually transmitted disease (STD) testing.  Diabetes screening. This is done by checking your blood sugar (glucose) after you have not eaten for a while (fasting). You may have this done every 1-3 years.  Bone density scan. This is done to screen for osteoporosis. You may have this done starting at age 47.  Mammogram. This may be done every 1-2 years. Talk to your health care provider about how often you should have regular mammograms. Talk with your health care provider about your test results, treatment options, and if necessary, the need for more tests. Vaccines  Your health care provider may recommend certain vaccines, such as:  Influenza vaccine. This is recommended every year.  Tetanus, diphtheria, and acellular pertussis (Tdap, Td) vaccine. You may need  a Td booster every 10 years.  Zoster vaccine. You may need this after age 80.  Pneumococcal 13-valent conjugate (PCV13) vaccine. One dose is recommended after age 48.  Pneumococcal polysaccharide (PPSV23) vaccine. One dose is recommended after age 43. Talk to your health care  provider about which screenings and vaccines you need and how often you need them. This information is not intended to replace advice given to you by your health care provider. Make sure you discuss any questions you have with your health care provider. Document Released: 03/28/2015 Document Revised: 11/19/2015 Document Reviewed: 12/31/2014 Elsevier Interactive Patient Education  2017 [place] Prevention in the Home Falls can cause injuries. They can happen to people of all ages. There are many things you can do to make your home safe and to help prevent falls. What can I do on the outside of my home?  Regularly fix the edges of walkways and driveways and fix any cracks.  Remove anything that might make you trip as you walk through a door, such as a raised step or threshold.  Trim any bushes or trees on the path to your home.  Use bright outdoor lighting.  Clear any walking paths of anything that might make someone trip, such as rocks or tools.  Regularly check to see if handrails are loose or broken. Make sure that both sides of any steps have handrails.  Any raised decks and porches should have guardrails on the edges.  Have any leaves, snow, or ice cleared regularly.  Use sand or salt on walking paths during winter.  Clean up any spills in your garage right away. This includes oil or grease spills. What can I do in the bathroom?  Use night lights.  Install grab bars by the toilet and in the tub and shower. Do not use towel bars as grab bars.  Use non-skid mats or decals in the tub or shower.  If you need to sit down in the shower, use a plastic, non-slip stool.  Keep the floor dry. Clean up any water that spills on the floor as soon as it happens.  Remove soap buildup in the tub or shower regularly.  Attach bath mats securely with double-sided non-slip rug tape.  Do not have throw rugs and other things on the floor that can make you trip. What can I do in  the bedroom?  Use night lights.  Make sure that you have a light by your bed that is easy to reach.  Do not use any sheets or blankets that are too big for your bed. They should not hang down onto the floor.  Have a firm chair that has side arms. You can use this for support while you get dressed.  Do not have throw rugs and other things on the floor that can make you trip. What can I do in the kitchen?  Clean up any spills right away.  Avoid walking on wet floors.  Keep items that you use a lot in easy-to-reach places.  If you need to reach something above you, use a strong step stool that has a grab bar.  Keep electrical cords out of the way.  Do not use floor polish or wax that makes floors slippery. If you must use wax, use non-skid floor wax.  Do not have throw rugs and other things on the floor that can make you trip. What can I do with my stairs?  Do not leave any items on the  stairs.  Make sure that there are handrails on both sides of the stairs and use them. Fix handrails that are broken or loose. Make sure that handrails are as long as the stairways.  Check any carpeting to make sure that it is firmly attached to the stairs. Fix any carpet that is loose or worn.  Avoid having throw rugs at the top or bottom of the stairs. If you do have throw rugs, attach them to the floor with carpet tape.  Make sure that you have a light switch at the top of the stairs and the bottom of the stairs. If you do not have them, ask someone to add them for you. What else can I do to help prevent falls?  Wear shoes that:  Do not have high heels.  Have rubber bottoms.  Are comfortable and fit you well.  Are closed at the toe. Do not wear sandals.  If you use a stepladder:  Make sure that it is fully opened. Do not climb a closed stepladder.  Make sure that both sides of the stepladder are locked into place.  Ask someone to hold it for you, if possible.  Clearly mark and  make sure that you can see:  Any grab bars or handrails.  First and last steps.  Where the edge of each step is.  Use tools that help you move around (mobility aids) if they are needed. These include:  Canes.  Walkers.  Scooters.  Crutches.  Turn on the lights when you go into a dark area. Replace any light bulbs as soon as they burn out.  Set up your furniture so you have a clear path. Avoid moving your furniture around.  If any of your floors are uneven, fix them.  If there are any pets around you, be aware of where they are.  Review your medicines with your doctor. Some medicines can make you feel dizzy. This can increase your chance of falling. Ask your doctor what other things that you can do to help prevent falls. This information is not intended to replace advice given to you by your health care provider. Make sure you discuss any questions you have with your health care provider. Document Released: 12/26/2008 Document Revised: 08/07/2015 Document Reviewed: 04/05/2014 Elsevier Interactive Patient Education  2017 [employer].

## 2018-07-14 NOTE — Progress Notes (Signed)
Patient ID: Catherine Snyder, female   DOB: February 10, 1941, 78 y.o.   MRN: [id number] This service is provided via telemedicine  No vital signs collected/recorded due to the encounter was a telemedicine visit.   Location of patient (ex: home, work):  HOME  Patient consents to a telephone visit:  YES  Location of the provider (ex: office, home):  OFFICE  Name of any referring provider:  Sherrie Mustache, NP  Names of all persons participating in the telemedicine service and their role in the encounter:  PATIENT, Edwin Dada, [place], [place], NP  Time spent on call:  8:42

## 2018-07-14 NOTE — Progress Notes (Signed)
Subjective:   Catherine Snyder is a 78 y.o. female who presents for Medicare Annual (Subsequent) preventive examination.  Review of Systems:   Cardiac Risk Factors include: advanced age (>75men, >26 women);hypertension     Objective:     Vitals: There were no vitals taken for this visit.  There is no height or weight on file to calculate BMI.  Advanced Directives 07/14/2018 07/04/2018 07/20/2017 06/13/2017 06/13/2017 09/22/2016 05/14/2016  Does Patient Have a Medical Advance Directive? Yes No Yes Yes Yes Yes Yes  Type of [employer] [place];Living will [place];Living will [place];Living will [place];Living will [place];Living will  Does patient want to make changes to medical advance directive? No - Patient declined - - No - Patient declined No - Patient declined No - Patient declined -  Copy of [place] in Chart? No - copy requested - Yes Yes Yes Yes Yes  Would patient like information on creating a medical advance directive? No - Patient declined No - Patient declined - - - - -    Tobacco Social History   Tobacco Use  Smoking Status Never Smoker  Smokeless Tobacco Never Used     Counseling given: Not Answered   Clinical Intake:  Pre-visit preparation completed: No  Pain : No/denies pain     Nutritional Risks: None Diabetes: No  How often do you need to have someone help you when you read instructions, pamphlets, or other written materials from your doctor or pharmacy?: 1 - Never What is the last grade level you completed in school?: 12 grade   Interpreter Needed?: No  Information entered by ::   FNP-C   Past Medical History:  Diagnosis Date  . Bladder cancer ([place])    a. s/p resection in 06/2015  . Dysuria   . Frequency of urination   . Hematuria   . Hypertension   . Nocturia   . Wears glasses    . Wears partial dentures    upper   Past Surgical History:  Procedure Laterality Date  . CARDIAC CATHETERIZATION N/A 02/04/2016   Procedure: RIGHT/LEFT HEART CATH AND CORONARY ANGIOGRAPHY;  Surgeon: Troy Sine, MD;  Location: [facility];  Service: Cardiovascular;  Laterality: N/A;  . CYSTOSCOPY W/ RETROGRADES Bilateral 07/07/2015   Procedure: CYSTOSCOPY WITH RETROGRADE PYELOGRAM;  Surgeon: Cleon Gustin, MD;  [facility];  Service: Urology;  Laterality: Bilateral;  . IR GENERIC HISTORICAL  02/07/2016   IR ANGIOGRAM SELECTIVE EACH ADDITIONAL VESSEL 02/07/2016 Jacqulynn Cadet, MD [facility]  . IR GENERIC HISTORICAL  02/07/2016   IR US GUIDE VASC ACCESS RIGHT 02/07/2016 Jacqulynn Cadet, MD [facility]  . IR GENERIC HISTORICAL  02/07/2016   IR ANGIOGRAM PULMONARY BILATERAL SELECTIVE 02/07/2016 Jacqulynn Cadet, MD [facility]  . IR GENERIC HISTORICAL  02/07/2016   IR INFUSION THROMBOL ARTERIAL INITIAL (MS) 02/07/2016 Jacqulynn Cadet, MD [facility]  . IR GENERIC HISTORICAL  02/07/2016   IR INFUSION THROMBOL ARTERIAL INITIAL (MS) 02/07/2016 Jacqulynn Cadet, MD [facility]  . IR GENERIC HISTORICAL  02/07/2016   IR ANGIOGRAM SELECTIVE EACH ADDITIONAL VESSEL 02/07/2016 Jacqulynn Cadet, MD [facility]  . NO PAST SURGERIES    . TRANSURETHRAL RESECTION OF BLADDER TUMOR WITH GYRUS (TURBT-GYRUS) N/A 07/07/2015   Procedure: TRANSURETHRAL RESECTION OF BLADDER TUMOR WITH GYRUS (TURBT-GYRUS);  Surgeon: Cleon Gustin, MD;  Location: [facility];  Service: Urology;  Laterality: N/A;   Family History  Problem Relation Age of Onset  . CAD Mother 45  . Heart disease Father 30  . CAD Brother 29   Social History   Socioeconomic History  . Marital status: Single    Spouse name: Not on file  . Number of children: Not on file  . Years of education: Not on file  . Highest education level: Not on file  Occupational History  .  Occupation: Retired Public house manager  . Financial resource strain: Not hard at all  . Food insecurity:    Worry: Never true    Inability: Never true  . Transportation needs:    Medical: No    Non-medical: No  Tobacco Use  . Smoking status: Never Smoker  . Smokeless tobacco: Never Used  Substance and Sexual Activity  . Alcohol use: No  . Drug use: No  . Sexual activity: Never  Lifestyle  . Physical activity:    Days per week: 7 days    Minutes per session: 10 min  . Stress: Only a little  Relationships  . Social connections:    Talks on phone: More than three times a week    Gets together: More than three times a week    Attends religious service: More than 4 times per year    Active member of club or organization: No    Attends meetings of clubs or organizations: Never    Relationship status: Widowed  Other Topics Concern  . Not on file  Social History Narrative    Diet:   Do you drink/eat things with caffeine? Yes coffee   Marital status: Divorced                              What year were you married?    Do you live in a house, apartment, assisted living, condo, trailer, etc)? House   Is it one or more stories? 1 story   How many persons live in your home? 4   Do you have any pets in your home? 0   Current or past profession: Dietitian   Do you exercise? Yes                                       Type & how often:  Walk daily   Do you have a living will? No   Do you have a DNR Form? No   Do you have a POA/HPOA forms? No    Outpatient Encounter Medications as of 07/14/2018  Medication Sig  . amLODipine (NORVASC) 10 MG tablet Take 1 tablet (10 mg total) by mouth daily.  . Ascorbic Acid (VITAMIN C) 1000 MG tablet Take 1,000 mg by mouth daily.  Marland Kitchen atorvastatin (LIPITOR) 80 MG tablet Take one tablet by mouth once daily at 6pm  . Cholecalciferol (VITAMIN D) 2000 units tablet Take 2,000 Units by mouth daily.  . diclofenac sodium (VOLTAREN) 1 % GEL Apply 2 g  topically 4 (four) times daily. To left thumb swelling  . nitroGLYCERIN (NITROSTAT) 0.4 MG SL tablet Place 1 tablet (0.4 mg total) under the tongue every 5 (five) minutes x 3 doses as needed for chest pain.  Marland Kitchen rOPINIRole (REQUIP) 0.5 MG tablet TAKE 1 TABLET (0.5 MG TOTAL) BY MOUTH AT BEDTIME.  Marland Kitchen Alveda Reasons  20 MG TABS tablet TAKE ONE TABLET BY MOUTH ONCE DAILY WITH SUPPER   No facility-administered encounter medications on file as of 07/14/2018.     Activities of Daily Living In your present state of health, do you have any difficulty performing the following activities: 07/14/2018  Hearing? N  Vision? N  Difficulty concentrating or making decisions? N  Walking or climbing stairs? N  Dressing or bathing? N  Doing errands, shopping? N  Preparing Food and eating ? N  Using the Toilet? N  In the past six months, have you accidently leaked urine? N  Do you have problems with loss of bowel control? N  Managing your Medications? N  Managing your Finances? N  Housekeeping or managing your Housekeeping? N  Some recent data might be hidden    Patient Care Team: Lauree Chandler, NP as PCP - General (Geriatric Medicine)    Assessment:   This is a routine wellness examination for [place].  Exercise Activities and Dietary recommendations Current Exercise Habits: Home exercise routine, Type of exercise: stretching, Time (Minutes): 10, Frequency (Times/Week): 3, Weekly Exercise (Minutes/Week): 30, Intensity: Mild  Goals    . <enter goal here>     Starting 05/07/16, I will maintain my current lifestyle.        Fall Risk Fall Risk  07/14/2018 07/04/2018 07/20/2017 06/13/2017 06/13/2017  Falls in the past year? 0 0 No No No  Number falls in past yr: 0 0 - - -  Injury with Fall? 0 0 - - -   Is the patient's home free of loose throw rugs in walkways, pet beds, electrical cords, etc?   no      Grab bars in the bathroom? no      Handrails on the stairs?   yes      Adequate lighting?   yes  Depression  Screen PHQ 2/9 Scores 07/14/2018 07/04/2018 06/13/2017 05/21/2016  PHQ - 2 Score 0 0 0 0  PHQ- 9 Score - - - 0     Cognitive Function MMSE - Mini Mental State Exam 06/13/2017 05/07/2016 05/07/2015  Not completed: - - (No Data)  Orientation to time 5 5 4   Orientation to Place 3 4 4   Registration 3 3 3   Attention/ Calculation 5 5 5   Recall 1 3 2   Language- name 2 objects 2 2 2   Language- repeat 1 1 0  Language- follow 3 step command 3 3 3   Language- read & follow direction 1 1 1   Write a sentence 1 0 1  Copy design 1 1 0  Total score 26 28 25      6CIT Screen 07/14/2018  What Year? 0 points  What month? 0 points  What time? 0 points  Count back from 20 0 points  Months in reverse 0 points  Repeat phrase 0 points  Total Score 0    Immunization History  Administered Date(s) Administered  . Influenza,inj,Quad PF,6+ Mos 11/27/2014  . Pneumococcal Conjugate-13 06/13/2017    Qualifies for Shingles Vaccine? Declined   Screening Tests Health Maintenance  Topic Date Due  . MAMMOGRAM  12/10/2017  . PNA vac Low Risk Adult (2 of 2 - PPSV23) 06/14/2018  . TETANUS/TDAP  04/16/2019 (Originally 01/07/1960)  . INFLUENZA VACCINE  10/14/2018  . DEXA SCAN  Completed    Cancer Screenings: Lung: Low Dose CT Chest recommended if Age 53-80 years, 30 pack-year currently smoking OR have quit w/in 15years. Patient does not qualify. Breast:  Up to date  on Mammogram? Yes   Up to date of Bone Density/Dexa? Yes Colorectal: Up to date   Additional Screenings:  Hepatitis C Screening: low risk      Plan:   - low carbohydrate,low saturated fats,high vegetable diet and increase physical activity/exercise.  I have personally reviewed and noted the following in the patient's chart:   . Medical and social history . Use of alcohol, tobacco or illicit drugs  . Current medications and supplements . Functional ability and status . Nutritional status . Physical activity . Advanced directives . List of  other physicians . Hospitalizations, surgeries, and ER visits in previous 12 months . Vitals . Screenings to include cognitive, depression, and falls . Referrals and appointments  In addition, I have reviewed and discussed with patient certain preventive protocols, quality metrics, and best practice recommendations. A written personalized care plan for preventive services as well as general preventive health recommendations were provided to patient.   Sandrea Hughs, NP  07/14/2018

## 2018-07-25 ENCOUNTER — Other Ambulatory Visit: Payer: Self-pay | Admitting: Nurse Practitioner

## 2018-07-26 ENCOUNTER — Other Ambulatory Visit: Payer: Self-pay | Admitting: Nurse Practitioner

## 2018-07-26 DIAGNOSIS — G2581 Restless legs syndrome: Secondary | ICD-10-CM

## 2018-08-02 ENCOUNTER — Other Ambulatory Visit: Payer: Self-pay | Admitting: Nurse Practitioner

## 2018-09-07 ENCOUNTER — Other Ambulatory Visit: Payer: Self-pay

## 2018-09-07 ENCOUNTER — Ambulatory Visit (INDEPENDENT_AMBULATORY_CARE_PROVIDER_SITE_OTHER): Payer: PPO | Admitting: Family

## 2018-09-07 ENCOUNTER — Encounter: Payer: Self-pay | Admitting: Family

## 2018-09-07 VITALS — BP 138/80 | HR 63 | Temp 98.1°F | Ht 63.0 in | Wt 169.0 lb

## 2018-09-07 DIAGNOSIS — Z1231 Encounter for screening mammogram for malignant neoplasm of breast: Secondary | ICD-10-CM | POA: Diagnosis not present

## 2018-09-07 DIAGNOSIS — Z6829 Body mass index (BMI) 29.0-29.9, adult: Secondary | ICD-10-CM | POA: Diagnosis not present

## 2018-09-07 DIAGNOSIS — G2581 Restless legs syndrome: Secondary | ICD-10-CM

## 2018-09-07 DIAGNOSIS — I10 Essential (primary) hypertension: Secondary | ICD-10-CM | POA: Diagnosis not present

## 2018-09-07 DIAGNOSIS — Z0000 Encounter for general adult medical examination without abnormal findings: Secondary | ICD-10-CM | POA: Diagnosis not present

## 2018-09-07 DIAGNOSIS — E782 Mixed hyperlipidemia: Secondary | ICD-10-CM

## 2018-09-07 DIAGNOSIS — H6121 Impacted cerumen, right ear: Secondary | ICD-10-CM

## 2018-09-07 MED ORDER — DEBROX 6.5 % OT SOLN: 5.0000 [drp] | Freq: Two times a day (BID) | OTIC | 0 refills | Status: AC

## 2018-09-07 NOTE — Progress Notes (Signed)
Provider: Arneta Mahmood FNP-C   Lauree Chandler, NP  Patient Care Team: Lauree Chandler, NP as PCP - General (Geriatric Medicine)  Extended Emergency Contact Information Primary Emergency Contact: Back,Jeanette Address: [address]           [place], [place] [address] Johnnette Litter of [place] Phone: [phone] Mobile Phone: [phone] Relation: Daughter Secondary Emergency Contact: Chiquita Loth [place] of [place] Phone: [phone] Mobile Phone: [phone] Relation: Son   Goals of care: Advanced Directive information Advanced Directives 07/14/2018  Does Patient Have a Medical Advance Directive? Yes  Type of Advance Directive [place]  Does patient want to make changes to medical advance directive? No - Patient declined  Copy of [place] in Chart? No - copy requested  Would patient like information on creating a medical advance directive? No - Patient declined     Chief Complaint  Patient presents with  . Annual Exam    CPE    HPI:  Pt is a 78 y.o. female seen today for annual physical exam.she denies any acute issues during visit.she does not check her blood pressure at home.denies any signs and symptoms  of low / high blood pressure.she tries to eat healthy diet and does home exercise.she is motivated by her grand children to exercise.No recent fall episode.she describes appetite as good. RLS - states symptoms under control with requip. Immunization - reviewed states up to date thought on chart review she is due for second dose of PPSV 23.she will verify then notify provider's office if not yet received.    She is due for a mammogram.she declines Pap smear this visit states follows up with Gyn for breast and pap smear.she agrees for mammogram screening order to be placed today.   Past Medical History:  Diagnosis Date  . Bladder cancer ([place])    a. s/p resection in 06/2015  . Dysuria   .  Frequency of urination   . Hematuria   . Hypertension   . Nocturia   . Wears glasses   . Wears partial dentures    upper   Past Surgical History:  Procedure Laterality Date  . CARDIAC CATHETERIZATION N/A 02/04/2016   Procedure: RIGHT/LEFT HEART CATH AND CORONARY ANGIOGRAPHY;  Surgeon: Troy Sine, MD;  Location: [facility];  Service: Cardiovascular;  Laterality: N/A;  . CYSTOSCOPY W/ RETROGRADES Bilateral 07/07/2015   Procedure: CYSTOSCOPY WITH RETROGRADE PYELOGRAM;  Surgeon: Cleon Gustin, MD;  Location: [facility];  Service: Urology;  Laterality: Bilateral;  . IR GENERIC HISTORICAL  02/07/2016   IR ANGIOGRAM SELECTIVE EACH ADDITIONAL VESSEL 02/07/2016 Jacqulynn Cadet, MD [facility]  . IR GENERIC HISTORICAL  02/07/2016   IR US GUIDE VASC ACCESS RIGHT 02/07/2016 Jacqulynn Cadet, MD [facility]  . IR GENERIC HISTORICAL  02/07/2016   IR ANGIOGRAM PULMONARY BILATERAL SELECTIVE 02/07/2016 Jacqulynn Cadet, MD [facility]  . IR GENERIC HISTORICAL  02/07/2016   IR INFUSION THROMBOL ARTERIAL INITIAL (MS) 02/07/2016 Jacqulynn Cadet, MD [facility]  . IR GENERIC HISTORICAL  02/07/2016   IR INFUSION THROMBOL ARTERIAL INITIAL (MS) 02/07/2016 Jacqulynn Cadet, MD [facility]  . IR GENERIC HISTORICAL  02/07/2016   IR ANGIOGRAM SELECTIVE EACH ADDITIONAL VESSEL 02/07/2016 Jacqulynn Cadet, MD [facility]  . NO PAST SURGERIES    . TRANSURETHRAL RESECTION OF BLADDER TUMOR WITH GYRUS (TURBT-GYRUS) N/A 07/07/2015   Procedure: TRANSURETHRAL RESECTION OF BLADDER TUMOR WITH GYRUS (TURBT-GYRUS);  Surgeon: Cleon Gustin, MD;  Location: [place];  Service: Urology;  Laterality: N/A;    No Known Allergies  Allergies as of 09/07/2018   No Known Allergies     Medication List       Accurate as of September 07, 2018  2:05 PM. If you have any questions, ask your nurse or doctor.        amLODipine 10 MG tablet Commonly known as: NORVASC  Take 1 tablet (10 mg total) by mouth daily.   atorvastatin 80 MG tablet Commonly known as: LIPITOR TAKE ONE TABLET BY MOUTH ONCE DAILY AT 6PM   diclofenac sodium 1 % Gel Commonly known as: VOLTAREN Apply 2 g topically 4 (four) times daily. To left thumb swelling   nitroGLYCERIN 0.4 MG SL tablet Commonly known as: NITROSTAT Place 1 tablet (0.4 mg total) under the tongue every 5 (five) minutes x 3 doses as needed for chest pain.   rOPINIRole 0.5 MG tablet Commonly known as: REQUIP TAKE 1 TABLET (0.5 MG TOTAL) BY MOUTH AT BEDTIME.   vitamin C 1000 MG tablet Take 1,000 mg by mouth daily.   Vitamin D 50 MCG (2000 UT) tablet Take 2,000 Units by mouth daily.   Xarelto 20 MG Tabs tablet Generic drug: rivaroxaban TAKE ONE TABLET BY MOUTH ONCE DAILY WITH SUPPER       Review of Systems  Constitutional: Negative for appetite change, chills, fatigue and fever.  HENT: Negative for congestion, rhinorrhea, sinus pressure, sinus pain, sneezing, sore throat and trouble swallowing.   Eyes: Positive for visual disturbance. Negative for pain, discharge, redness and itching.       Wears eye glasses.Hx right eye cataract surgery   Respiratory: Negative for cough, chest tightness, shortness of breath and wheezing.   Cardiovascular: Positive for leg swelling. Negative for chest pain and palpitations.  Gastrointestinal: Negative for abdominal distention, abdominal pain, constipation, diarrhea, nausea and vomiting.  Endocrine: Negative for cold intolerance, heat intolerance, polydipsia, polyphagia and polyuria.  Genitourinary: Negative for difficulty urinating, dysuria, flank pain, frequency and urgency.  Musculoskeletal: Positive for arthralgias. Negative for gait problem.  Skin: Negative for color change, pallor, rash and wound.  Neurological: Negative for dizziness, weakness, light-headedness, numbness and headaches.  Psychiatric/Behavioral: Negative for agitation, confusion and sleep  disturbance. The patient is not nervous/anxious.    Immunization History  Administered Date(s) Administered  . Influenza,inj,Quad PF,6+ Mos 11/27/2014  . Pneumococcal Conjugate-13 06/13/2017   Pertinent  Health Maintenance Due  Topic Date Due  . MAMMOGRAM  12/10/2017  . PNA vac Low Risk Adult (2 of 2 - PPSV23) 06/14/2018  . INFLUENZA VACCINE  10/14/2018  . DEXA SCAN  Completed   Fall Risk  09/07/2018 07/14/2018 07/04/2018 07/20/2017 06/13/2017  Falls in the past year? 0 0 0 No No  Number falls in past yr: 0 0 0 - -  Injury with Fall? 0 0 0 - -    Vitals:   09/07/18 1324  BP: 138/80  Pulse: 63  Temp: 98.1 F (36.7 C)  TempSrc: Oral  SpO2: 97%  Weight: 169 lb (76.7 kg)  Height: 5\' 3"  (1.6 m)   Body mass index is 29.94 kg/m. Physical Exam Vitals signs reviewed.  Constitutional:      General: She is not in acute distress.    Appearance: She is overweight. She is not ill-appearing.  HENT:     Head: Normocephalic.     Right Ear: There is impacted cerumen.     Left Ear: Tympanic membrane, ear canal and  external ear normal. There is no impacted cerumen.     Nose: No congestion or rhinorrhea.     Mouth/Throat:     Mouth: Mucous membranes are moist.     Pharynx: Oropharynx is clear. No oropharyngeal exudate or posterior oropharyngeal erythema.  Eyes:     General: No scleral icterus.       Right eye: No discharge.        Left eye: No discharge.     Conjunctiva/sclera: Conjunctivae normal.     Pupils: Pupils are equal, round, and reactive to light.  Neurological:     Mental Status: She is alert.    Labs reviewed: Recent Labs    07/12/18 0830  NA 141  K 4.0  CL 106  CO2 26  GLUCOSE 122*  BUN 8  CREATININE 0.69  CALCIUM 9.4   Recent Labs    07/12/18 0830  AST 28  ALT 21  BILITOT 0.5  PROT 7.7   Recent Labs    07/12/18 0830  WBC 4.0  NEUTROABS 1,832  HGB 12.3  HCT 37.4  MCV 88.4  PLT 276   Lab Results  Component Value Date   TSH 1.71 08/31/2017   No  results found for: HGBA1C Lab Results  Component Value Date   CHOL 97 07/12/2018   HDL 36 (L) 07/12/2018   LDLCALC 46 07/12/2018   TRIG 71 07/12/2018   CHOLHDL 2.7 07/12/2018    Significant Diagnostic Results in last 30 days:  No results found.  Assessment/Plan 1. Physical exam  Up to date with immunization will verify 2 dose of PPV 23 thinks she had it already. Medication and labs reviewed patient counselled regarding yearly exam, prevention of dental and periodontal disease, diet, regular sustained exercise for at least 30 minutes x 3 /week,COVID-19 hand hygiene, mask and social distancing per CDC guidelines.Proper use of sun screen and protective clothing, recommended schedule for routine labs. Fall screening done.  2. Essential hypertension B/p stable.continue on amlodipine 10 mg tablet daily.on Nitro PRN for chest pain has not used.  CBC/diff,future  CMP, future TSH level,Future    3. RLS (restless legs syndrome) Symptoms under control on Requip 0.5 mg tablet at bedtime.   4. Mixed hyperlipidemia LDL at goal.continue on Atorvastatin 80 mg tablet daily.discussed low carbohydrate,low saturated fats and high vegetable diet.Exercise at least 30 minutes three times per week.  Lipid panel,Future   5. Impacted cerumen  TM not visualized.Instil Debrox 6.5% otic solution 5 drops into right ear twice daily x 4 days then follow up for ear lavage.   6. Encounter for screening mammogram  Asymptomatic. Mammogram screening ordered.  Family/ staff Communication: Reviewed plan of care with patient   Labs/tests ordered:  Lipid panel,Future  CBC/diff,future  CMP, future TSH level,Future    Next appointment: 4 months for medical management of chronic issues. F/u in 5 days for right ear lavage.   Sandrea Hughs, NP

## 2018-09-07 NOTE — Patient Instructions (Signed)

## 2018-09-12 ENCOUNTER — Ambulatory Visit: Payer: PPO | Admitting: Family

## 2018-09-14 ENCOUNTER — Other Ambulatory Visit: Payer: Self-pay

## 2018-09-14 ENCOUNTER — Encounter: Payer: Self-pay | Admitting: Family

## 2018-09-14 ENCOUNTER — Ambulatory Visit (INDEPENDENT_AMBULATORY_CARE_PROVIDER_SITE_OTHER): Payer: PPO | Admitting: Family

## 2018-09-14 VITALS — BP 138/78 | HR 62 | Temp 98.0°F | Ht 63.0 in | Wt 168.6 lb

## 2018-09-14 DIAGNOSIS — H6121 Impacted cerumen, right ear: Secondary | ICD-10-CM | POA: Diagnosis not present

## 2018-09-14 NOTE — Progress Notes (Addendum)
Provider: Dinah Ngetich FNP-C  Lauree Chandler, NP  Patient Care Team: Lauree Chandler, NP as PCP - General (Geriatric Medicine)  Extended Emergency Contact Information Primary Emergency Contact: Aumiller,Jeanette Address: [address]           [place], [place] [address] Johnnette Litter of [place] Phone: [phone] Mobile Phone: [phone] Relation: Daughter Secondary Emergency Contact: Chiquita Loth [place] of [place] Phone: [phone] Mobile Phone: [phone] Relation: Son   Goals of care: Advanced Directive information Advanced Directives 07/14/2018  Does Patient Have a Medical Advance Directive? Yes  Type of Advance Directive [place]  Does patient want to make changes to medical advance directive? No - Patient declined  Copy of [place] in Chart? No - copy requested  Would patient like information on creating a medical advance directive? No - Patient declined     Chief Complaint  Patient presents with  . Acute Visit    Right ear implacted     HPI:  Pt is a 78 y.o. female seen today for an acute visit for evaluation of right ear cerumen impaction.she denies any acute issues this visit.she states has used debrox ear drops as directed on last visit now here for ear lavage.she denies any fever,chills,pain in ear,ringing or changes in hearing.she states hears more better now.   Past Medical History:  Diagnosis Date  . Bladder cancer ([place])    a. s/p resection in 06/2015  . Dysuria   . Frequency of urination   . Hematuria   . Hypertension   . Nocturia   . Wears glasses   . Wears partial dentures    upper   Past Surgical History:  Procedure Laterality Date  . CARDIAC CATHETERIZATION N/A 02/04/2016   Procedure: RIGHT/LEFT HEART CATH AND CORONARY ANGIOGRAPHY;  Surgeon: Troy Sine, MD;  Location: [place] [facility];  Service: Cardiovascular;  Laterality: N/A;  . CYSTOSCOPY W/ RETROGRADES  Bilateral 07/07/2015   Procedure: CYSTOSCOPY WITH RETROGRADE PYELOGRAM;  Surgeon: Cleon Gustin, MD;  [facility];  Service: Urology;  Laterality: Bilateral;  . IR GENERIC HISTORICAL  02/07/2016   IR ANGIOGRAM SELECTIVE EACH ADDITIONAL VESSEL 02/07/2016 Jacqulynn Cadet, MD [facility]  . IR GENERIC HISTORICAL  02/07/2016   IR US GUIDE VASC ACCESS RIGHT 02/07/2016 Jacqulynn Cadet, MD [facility]  . IR GENERIC HISTORICAL  02/07/2016   IR ANGIOGRAM PULMONARY BILATERAL SELECTIVE 02/07/2016 Jacqulynn Cadet, MD [facility]  . IR GENERIC HISTORICAL  02/07/2016   IR INFUSION THROMBOL ARTERIAL INITIAL (MS) 02/07/2016 Jacqulynn Cadet, MD [facility]  . IR GENERIC HISTORICAL  02/07/2016   IR INFUSION THROMBOL ARTERIAL INITIAL (MS) 02/07/2016 Jacqulynn Cadet, MD [facility]  . IR GENERIC HISTORICAL  02/07/2016   IR ANGIOGRAM SELECTIVE EACH ADDITIONAL VESSEL 02/07/2016 Jacqulynn Cadet, MD [facility]  . NO PAST SURGERIES    . TRANSURETHRAL RESECTION OF BLADDER TUMOR WITH GYRUS (TURBT-GYRUS) N/A 07/07/2015   Procedure: TRANSURETHRAL RESECTION OF BLADDER TUMOR WITH GYRUS (TURBT-GYRUS);  Surgeon: Cleon Gustin, MD;  Location: [facility];  Service: Urology;  Laterality: N/A;    No Known Allergies  Outpatient Encounter Medications as of 09/14/2018  Medication Sig  . amLODipine (NORVASC) 10 MG tablet Take 1 tablet (10 mg total) by mouth daily.  . Ascorbic Acid (VITAMIN C) 1000 MG tablet Take 1,000 mg by mouth daily.  Marland Kitchen atorvastatin (LIPITOR) 80 MG tablet TAKE ONE TABLET BY MOUTH ONCE DAILY AT 6PM  .  Cholecalciferol (VITAMIN D) 2000 units tablet Take 2,000 Units by mouth daily.  . diclofenac sodium (VOLTAREN) 1 % GEL Apply 2 g topically 4 (four) times daily. To left thumb swelling  . nitroGLYCERIN (NITROSTAT) 0.4 MG SL tablet Place 1 tablet (0.4 mg total) under the tongue every 5 (five) minutes x 3 doses as needed for chest pain.  Marland Kitchen rOPINIRole  (REQUIP) 0.5 MG tablet TAKE 1 TABLET (0.5 MG TOTAL) BY MOUTH AT BEDTIME.  Marland Kitchen XARELTO 20 MG TABS tablet TAKE ONE TABLET BY MOUTH ONCE DAILY WITH SUPPER   No facility-administered encounter medications on file as of 09/14/2018.     Review of Systems  Constitutional: Negative for chills, fatigue and fever.  HENT: Negative for congestion, hearing loss, rhinorrhea, sinus pressure, sinus pain, sneezing, sore throat and tinnitus.   Eyes: Negative for discharge, redness, itching and visual disturbance.  Respiratory: Negative for cough, chest tightness, shortness of breath and wheezing.   Cardiovascular: Negative for chest pain, palpitations and leg swelling.  Neurological: Negative for dizziness, light-headedness and headaches.    Immunization History  Administered Date(s) Administered  . Influenza,inj,Quad PF,6+ Mos 11/27/2014  . Pneumococcal Conjugate-13 06/13/2017   Pertinent  Health Maintenance Due  Topic Date Due  . MAMMOGRAM  12/10/2017  . PNA vac Low Risk Adult (2 of 2 - PPSV23) 06/14/2018  . INFLUENZA VACCINE  10/14/2018  . DEXA SCAN  Completed   Fall Risk  09/14/2018 09/07/2018 07/14/2018 07/04/2018 07/20/2017  Falls in the past year? 0 0 0 0 No  Number falls in past yr: 0 0 0 0 -  Injury with Fall? 0 0 0 0 -    Vitals:   09/14/18 0940  BP: 138/78  Pulse: 62  Temp: 98 F (36.7 C)  TempSrc: Oral  SpO2: 98%  Weight: 168 lb 9.6 oz (76.5 kg)  Height: 5\' 3"  (1.6 m)   Body mass index is 29.87 kg/m. Physical Exam Constitutional:      General: She is not in acute distress.    Appearance: She is overweight. She is not ill-appearing.  HENT:     Head: Normocephalic.     Left Ear: Tympanic membrane, ear canal and external ear normal. There is no impacted cerumen.     Ears:     Comments: Right ear cerumen impaction.Ear lavaged with warm water by CMA no cerumen obtained.Cerumen removed using Alligator forceps small amounts obtained.Ear lavaged by provider moderated amounts of cerumen  noted.large amounts of cerumen removed again with Alligator Forceps.patient tolerated procedure well.TM visualized clear.No bleeding or signs of infections noted.       Nose: No congestion or rhinorrhea.     Mouth/Throat:     Mouth: Mucous membranes are moist.     Pharynx: Oropharynx is clear. No oropharyngeal exudate or posterior oropharyngeal erythema.  Eyes:     General: No scleral icterus.       Right eye: No discharge.        Left eye: No discharge.     Conjunctiva/sclera: Conjunctivae normal.     Pupils: Pupils are equal, round, and reactive to light.  Cardiovascular:     Rate and Rhythm: Normal rate and regular rhythm.     Pulses: Normal pulses.     Heart sounds: Normal heart sounds. No murmur. No friction rub. No gallop.   Pulmonary:     Effort: Pulmonary effort is normal. No respiratory distress.     Breath sounds: Normal breath sounds. No wheezing, rhonchi or rales.  Chest:  Chest wall: No tenderness.  Skin:    General: Skin is warm and dry.     Coloration: Skin is not pale.     Findings: No erythema.  Neurological:     Mental Status: She is alert and oriented to person, place, and time.     Cranial Nerves: No cranial nerve deficit.     Sensory: No sensory deficit.     Motor: No weakness.  Psychiatric:        Mood and Affect: Mood normal.        Behavior: Behavior normal.        Thought Content: Thought content normal.        Judgment: Judgment normal.    Labs reviewed: Recent Labs    07/12/18 0830  NA 141  K 4.0  CL 106  CO2 26  GLUCOSE 122*  BUN 8  CREATININE 0.69  CALCIUM 9.4   Recent Labs    07/12/18 0830  AST 28  ALT 21  BILITOT 0.5  PROT 7.7   Recent Labs    07/12/18 0830  WBC 4.0  NEUTROABS 1,832  HGB 12.3  HCT 37.4  MCV 88.4  PLT 276   Lab Results  Component Value Date   TSH 1.71 08/31/2017   No results found for: HGBA1C Lab Results  Component Value Date   CHOL 97 07/12/2018   HDL 36 (L) 07/12/2018   LDLCALC 46  07/12/2018   TRIG 71 07/12/2018   CHOLHDL 2.7 07/12/2018    Significant Diagnostic Results in last 30 days:  No results found.  Assessment/Plan  Impacted cerumen of right ear Afebrile.Ear ;lavaged with warm water and large amounts of cerumen removed with Alligator forceps.Patient tolerated procedure well and was very appreciative for cleaning ear today.No pain,tenderness or signs of infection noted.  Family/ staff Communication: Reviewed plan of care with patient.  Labs/tests ordered: None   Next Appointment: 6 months for medical management of chronic medical issues.   Sandrea Hughs, NP

## 2018-10-10 ENCOUNTER — Encounter: Payer: Self-pay | Admitting: Family

## 2018-10-10 ENCOUNTER — Other Ambulatory Visit: Payer: Self-pay

## 2018-10-10 ENCOUNTER — Ambulatory Visit (INDEPENDENT_AMBULATORY_CARE_PROVIDER_SITE_OTHER): Payer: PPO | Admitting: Family

## 2018-10-10 ENCOUNTER — Ambulatory Visit
Admission: RE | Admit: 2018-10-10 | Discharge: 2018-10-10 | Disposition: A | Discharge status: Home / Self Care | Payer: PPO | Source: Ambulatory Visit | Attending: Family | Admitting: Family

## 2018-10-10 ENCOUNTER — Other Ambulatory Visit: Payer: Self-pay | Admitting: Family

## 2018-10-10 VITALS — BP 138/74 | HR 59 | Temp 98.4°F | Ht 63.0 in | Wt 168.6 lb

## 2018-10-10 DIAGNOSIS — M25552 Pain in left hip: Secondary | ICD-10-CM

## 2018-10-10 DIAGNOSIS — M1612 Unilateral primary osteoarthritis, left hip: Secondary | ICD-10-CM | POA: Diagnosis not present

## 2018-10-10 MED ORDER — MELOXICAM 7.5 MG PO TABS: 7.5000 mg | ORAL_TABLET | Freq: Every day | ORAL | 0 refills | Status: DC

## 2018-10-10 NOTE — Patient Instructions (Signed)
1. Mobic 7.5 mg tablet take one by mouth daily at bedtime.  2. Take Extra strength Tylenol 1000 mg tablet one by mouth twice daily in the morning and evening. May take 500 mg tablet daily at 2 Pm

## 2018-10-10 NOTE — Progress Notes (Addendum)
Provider: Maayan Jenning FNP-C  Lauree Chandler, NP  Patient Care Team: Lauree Chandler, NP as PCP - General (Geriatric Medicine)  Extended Emergency Contact Information Primary Emergency Contact: Chery,Jeanette Address: [address]           [place], [place] [address] Johnnette Litter of [place] Phone: [phone] Mobile Phone: [phone] Relation: Daughter Secondary Emergency Contact: Chiquita Loth [place] of [place] Phone: [phone] Mobile Phone: [phone] Relation: Son  Code Status:  Full code  Goals of care: Advanced Directive information Advanced Directives 07/14/2018  Does Patient Have a Medical Advance Directive? Yes  Type of Advance Directive [place]  Does patient want to make changes to medical advance directive? No - Patient declined  Copy of [place] in Chart? No - copy requested  Would patient like information on creating a medical advance directive? No - Patient declined     Chief Complaint  Patient presents with  . Acute Visit    Pain and swelling  in left hip on right side  patient states pain has been going on for 2 weeks    . Medication Management    Patient states taking advil to help with pain in hip     HPI:  Pt is a 77 y.o. female seen today for an acute visit for evaluation of left hip pain.she is here with her daughter.Daughter states left leg seems to be more swollen than the right.she states pain runs down from the hip to the foot area.she has had left hip pain for several weeks.Nothing reliefs the pain.she has taken Advil daily with no relief.Nothing aggravates the pain.No Numbness, tingling or leg weakness reported.she denies any trauma or injury to hip.No fall episodes. She states does have a significant medical history of osteoarthritis on her fingers.   Past Medical History:  Diagnosis Date  . Bladder cancer ([place])    a. s/p resection in 06/2015  . Dysuria   .  Frequency of urination   . Hematuria   . Hypertension   . Nocturia   . Wears glasses   . Wears partial dentures    upper   Past Surgical History:  Procedure Laterality Date  . CARDIAC CATHETERIZATION N/A 02/04/2016   Procedure: RIGHT/LEFT HEART CATH AND CORONARY ANGIOGRAPHY;  Surgeon: Troy Sine, MD;  Location: [facility];  Service: Cardiovascular;  Laterality: N/A;  . CYSTOSCOPY W/ RETROGRADES Bilateral 07/07/2015   Procedure: CYSTOSCOPY WITH RETROGRADE PYELOGRAM;  Surgeon: Cleon Gustin, MD;  Location: [facility];  Service: Urology;  Laterality: Bilateral;  . IR GENERIC HISTORICAL  02/07/2016   IR ANGIOGRAM SELECTIVE EACH ADDITIONAL VESSEL 02/07/2016 Jacqulynn Cadet, MD [facility]  . IR GENERIC HISTORICAL  02/07/2016   IR US GUIDE VASC ACCESS RIGHT 02/07/2016 Jacqulynn Cadet, MD [facility]  . IR GENERIC HISTORICAL  02/07/2016   IR ANGIOGRAM PULMONARY BILATERAL SELECTIVE 02/07/2016 Jacqulynn Cadet, MD [facility]  . IR GENERIC HISTORICAL  02/07/2016   IR INFUSION THROMBOL ARTERIAL INITIAL (MS) 02/07/2016 Jacqulynn Cadet, MD [facility]  . IR GENERIC HISTORICAL  02/07/2016   IR INFUSION THROMBOL ARTERIAL INITIAL (MS) 02/07/2016 Jacqulynn Cadet, MD [facility]  . IR GENERIC HISTORICAL  02/07/2016   IR ANGIOGRAM SELECTIVE EACH ADDITIONAL VESSEL 02/07/2016 Jacqulynn Cadet, MD [facility]  . NO PAST SURGERIES    . TRANSURETHRAL RESECTION OF BLADDER TUMOR WITH GYRUS (TURBT-GYRUS) N/A 07/07/2015   Procedure: TRANSURETHRAL RESECTION OF BLADDER TUMOR WITH GYRUS (TURBT-GYRUS);  Surgeon: Cleon Gustin, MD;  Location: [facility];  Service: Urology;  Laterality: N/A;    No Known Allergies  Outpatient Encounter Medications as of 10/10/2018  Medication Sig  . amLODipine (NORVASC) 10 MG tablet Take 1 tablet (10 mg total) by mouth daily.  . Ascorbic Acid (VITAMIN C) 1000 MG tablet Take 1,000 mg by mouth daily.  Marland Kitchen atorvastatin  (LIPITOR) 80 MG tablet TAKE ONE TABLET BY MOUTH ONCE DAILY AT 6PM  . Cholecalciferol (VITAMIN D) 2000 units tablet Take 2,000 Units by mouth daily.  . diclofenac sodium (VOLTAREN) 1 % GEL Apply 2 g topically 4 (four) times daily. To left thumb swelling  . nitroGLYCERIN (NITROSTAT) 0.4 MG SL tablet Place 1 tablet (0.4 mg total) under the tongue every 5 (five) minutes x 3 doses as needed for chest pain.  Marland Kitchen rOPINIRole (REQUIP) 0.5 MG tablet TAKE 1 TABLET (0.5 MG TOTAL) BY MOUTH AT BEDTIME.  Marland Kitchen XARELTO 20 MG TABS tablet TAKE ONE TABLET BY MOUTH ONCE DAILY WITH SUPPER   No facility-administered encounter medications on file as of 10/10/2018.     Review of Systems  Constitutional: Negative for chills, fatigue and fever.  Eyes: Negative for discharge and redness.  Respiratory: Negative for cough, chest tightness, shortness of breath and wheezing.   Cardiovascular: Negative for chest pain, palpitations and leg swelling.  Gastrointestinal: Negative for abdominal distention, abdominal pain, constipation, diarrhea, nausea and vomiting.  Genitourinary: Negative for difficulty urinating, dysuria, flank pain, frequency and urgency.  Musculoskeletal: Positive for arthralgias. Negative for back pain and gait problem.  Skin: Negative for color change, pallor, rash and wound.  Neurological: Negative for dizziness, weakness, numbness and headaches.    Immunization History  Administered Date(s) Administered  . Influenza,inj,Quad PF,6+ Mos 11/27/2014  . Pneumococcal Conjugate-13 06/13/2017   Pertinent  Health Maintenance Due  Topic Date Due  . MAMMOGRAM  12/10/2017  . PNA vac Low Risk Adult (2 of 2 - PPSV23) 06/14/2018  . INFLUENZA VACCINE  10/14/2018  . DEXA SCAN  Completed   Fall Risk  10/10/2018 09/14/2018 09/07/2018 07/14/2018 07/04/2018  Falls in the past year? 0 0 0 0 0  Number falls in past yr: 0 0 0 0 0  Injury with Fall? 0 0 0 0 0    Vitals:   10/10/18 0917  BP: 138/74  Pulse: (!) 59  Temp:  98.4 F (36.9 C)  TempSrc: Oral  SpO2: 98%  Weight: 168 lb 9.6 oz (76.5 kg)  Height: 5\' 3"  (1.6 m)   Body mass index is 29.87 kg/m. Physical Exam Vitals signs reviewed.  Constitutional:      General: She is not in acute distress.    Appearance: She is not ill-appearing.  HENT:     Mouth/Throat:     Mouth: Mucous membranes are moist.     Pharynx: Oropharynx is clear. No oropharyngeal exudate or posterior oropharyngeal erythema.  Eyes:     General: No scleral icterus.       Right eye: No discharge.        Left eye: No discharge.     Conjunctiva/sclera: Conjunctivae normal.     Pupils: Pupils are equal, round, and reactive to light.  Neck:     Musculoskeletal: Normal range of motion. No neck rigidity or muscular tenderness.  Cardiovascular:     Rate and Rhythm: Normal rate and regular rhythm.     Pulses: Normal pulses.     Heart sounds: Normal heart sounds. No murmur. No  friction rub. No gallop.   Pulmonary:     Effort: Pulmonary effort is normal. No respiratory distress.     Breath sounds: Normal breath sounds. No wheezing, rhonchi or rales.  Chest:     Chest wall: No tenderness.  Abdominal:     General: Bowel sounds are normal. There is no distension.     Palpations: Abdomen is soft. There is no mass.     Tenderness: There is no abdominal tenderness. There is no right CVA tenderness, left CVA tenderness, guarding or rebound.  Musculoskeletal: Normal range of motion.        General: No swelling or tenderness.     Right lower leg: No edema.     Left lower leg: No edema.     Comments: Negative leg raise   Lymphadenopathy:     Cervical: No cervical adenopathy.  Skin:    General: Skin is warm and dry.     Coloration: Skin is not pale.     Findings: No bruising, erythema or rash.  Neurological:     Mental Status: She is alert and oriented to person, place, and time.     Cranial Nerves: No cranial nerve deficit.     Sensory: No sensory deficit.     Motor: No weakness.      Gait: Gait normal.  Psychiatric:        Mood and Affect: Mood normal.        Behavior: Behavior normal.        Thought Content: Thought content normal.        Judgment: Judgment normal.    Labs reviewed: Recent Labs    07/12/18 0830  NA 141  K 4.0  CL 106  CO2 26  GLUCOSE 122*  BUN 8  CREATININE 0.69  CALCIUM 9.4   Recent Labs    07/12/18 0830  AST 28  ALT 21  BILITOT 0.5  PROT 7.7   Recent Labs    07/12/18 0830  WBC 4.0  NEUTROABS 1,832  HGB 12.3  HCT 37.4  MCV 88.4  PLT 276   Lab Results  Component Value Date   TSH 1.71 08/31/2017   No results found for: HGBA1C Lab Results  Component Value Date   CHOL 97 07/12/2018   HDL 36 (L) 07/12/2018   LDLCALC 46 07/12/2018   TRIG 71 07/12/2018   CHOLHDL 2.7 07/12/2018    Significant Diagnostic Results in last 30 days:  No results found.  Assessment/Plan   Left hip pain Afebrile.Negative exam findings.suspect possible arthritic pain verse sciatic nerve pain.Pain radiates to foot area. - DG Hip Unilat W OR W/O Pelvis 2-3 Views left; Future - Mobic 7.5 mg tablet take one by mouth daily at bedtime. - Take Extra strength Tylenol 1000 mg tablet one by mouth twice daily in the morning and evening. May take 500 mg tablet daily at 2 Pm   Addendum:  Left hip X-ray results showed no acute abnormality.Degenerative changes noted.will refer to Orthopedic for evaluation.   Family/ staff Communication: Reviewed plan of care with patient and daughter.  Labs/tests ordered:   DG Hip Unilat W OR W/O Pelvis 2-3 Views Left; Future  Sandrea Hughs, NP

## 2018-10-11 NOTE — Addendum Note (Signed)
Addended byMarlowe Sax C on: 10/11/2018 10:38 AM   Modules accepted: Orders

## 2018-10-13 ENCOUNTER — Ambulatory Visit: Payer: PPO | Admitting: Family Medicine

## 2018-10-16 ENCOUNTER — Other Ambulatory Visit: Payer: Self-pay | Admitting: Nurse Practitioner

## 2018-10-16 DIAGNOSIS — G2581 Restless legs syndrome: Secondary | ICD-10-CM

## 2018-10-17 ENCOUNTER — Ambulatory Visit: Payer: PPO | Admitting: Orthopaedic Surgery

## 2018-10-19 ENCOUNTER — Ambulatory Visit (INDEPENDENT_AMBULATORY_CARE_PROVIDER_SITE_OTHER): Payer: PPO

## 2018-10-19 ENCOUNTER — Ambulatory Visit (INDEPENDENT_AMBULATORY_CARE_PROVIDER_SITE_OTHER): Payer: PPO | Admitting: Orthopaedic Surgery

## 2018-10-19 ENCOUNTER — Other Ambulatory Visit: Payer: Self-pay

## 2018-10-19 DIAGNOSIS — M5442 Lumbago with sciatica, left side: Secondary | ICD-10-CM | POA: Diagnosis not present

## 2018-10-19 MED ORDER — PREDNISONE 10 MG (21) PO TBPK: ORAL_TABLET | ORAL | 0 refills | Status: DC

## 2018-10-19 MED ORDER — GABAPENTIN 100 MG PO CAPS: 100.0000 mg | ORAL_CAPSULE | Freq: Three times a day (TID) | ORAL | 3 refills | Status: DC

## 2018-10-19 NOTE — Progress Notes (Signed)
Office Visit Note   Patient: Catherine Snyder           Date of Birth: 1940/07/11           MRN: [id number] Visit Date: 10/19/2018              Requested by: Sandrea Hughs, NP [address] [place],  [place] [address] PCP: Sandrea Hughs, NP   Assessment & Plan: Visit Diagnoses:  1. Left-sided low back pain with left-sided sciatica, unspecified chronicity     Plan: Impression is lumbar radiculopathy.  Hip joint appears not to be involved.  We will prescribe prednisone Dosepak as well as gabapentin to hopefully calm this down.  If she does not get any better she will let us know so that we can obtain an MRI.  Questions encouraged and answered.  Follow-up as needed.  Follow-Up Instructions: Return if symptoms worsen or fail to improve.   Orders:  Orders Placed This Encounter  Procedures  . XR Lumbar Spine 2-3 Views   Meds ordered this encounter  Medications  . predniSONE (STERAPRED UNI-PAK 21 TAB) 10 MG (21) TBPK tablet    Sig: Take as directed    Dispense:  21 tablet    Refill:  0  . gabapentin (NEURONTIN) 100 MG capsule    Sig: Take 1-2 capsules (100-200 mg total) by mouth 3 (three) times daily.    Dispense:  60 capsule    Refill:  3      Procedures: No procedures performed   Clinical Data: No additional findings.   Subjective: Chief Complaint  Patient presents with  . Left Hip - Pain  . Left Leg - Pain    Catherine Snyder is a 78 year old female comes in with left hip pain for about a month.  She denies any groin pain.  She states that the pain is in her buttock and low back area that radiates down to her foot.  She denies any numbness and tingling.  She only has pain.  Denies any injuries.  Has not taken any medicines for this.  Denies any bowel bladder dysfunction.   Review of Systems  Constitutional: Negative.   HENT: Negative.   Eyes: Negative.   Respiratory: Negative.   Cardiovascular: Negative.   Endocrine: Negative.   Musculoskeletal: Negative.    Neurological: Negative.   Hematological: Negative.   Psychiatric/Behavioral: Negative.   All other systems reviewed and are negative.    Objective: Vital Signs: There were no vitals taken for this visit.  Physical Exam Vitals signs and nursing note reviewed.  Constitutional:      Appearance: She is well-developed.  HENT:     Head: Normocephalic and atraumatic.  Neck:     Musculoskeletal: Neck supple.  Pulmonary:     Effort: Pulmonary effort is normal.  Abdominal:     Palpations: Abdomen is soft.  Skin:    General: Skin is warm.     Capillary Refill: Capillary refill takes less than 2 seconds.  Neurological:     Mental Status: She is alert and oriented to person, place, and time.  Psychiatric:        Behavior: Behavior normal.        Thought Content: Thought content normal.        Judgment: Judgment normal.     Ortho Exam Left hip and lumbar spine exam shows tenderness of the left SI joint.  Trochanteric bursa is nontender.  Internal/external rotation of the hip is unremarkable.  Mildly positive straight leg raise. Specialty Comments:  No specialty comments available.  Imaging: Xr Lumbar Spine 2-3 Views  Result Date: 10/19/2018 Advanced degenerative disc disease of L4-5 and L5-S1 with lumbar spondylosis.    PMFS History: Patient Active Problem List   Diagnosis Date Noted  . Mixed hyperlipidemia 08/31/2017  . Primary osteoarthritis of first carpometacarpal joint of left hand 04/22/2017  . RLS (restless legs syndrome) 05/21/2016  . Iron deficiency anemia 05/21/2016  . High risk medication use 05/21/2016  . Dyslipidemia 03/02/2016  . Chronic anticoagulation 03/02/2016  . Saddle embolus of pulmonary artery without acute cor pulmonale (HCC)   . CAD in native artery   . Absolute anemia   . Pulmonary hypertension ([place])   . Pulmonary hypertension due to thromboembolism ([place]) 02/05/2016  . Elevated troponin   . Dyspnea on exertion   . Demand ischemia of myocardium  [facility]) - Not NSTEMI 02/03/2016  . Essential hypertension 11/27/2014   Past Medical History:  Diagnosis Date  . Bladder cancer ([place])    a. s/p resection in 06/2015  . Dysuria   . Frequency of urination   . Hematuria   . Hypertension   . Nocturia   . Wears glasses   . Wears partial dentures    upper    Family History  Problem Relation Age of Onset  . CAD Mother 5  . Heart disease Father 65  . CAD Brother 81    Past Surgical History:  Procedure Laterality Date  . CARDIAC CATHETERIZATION N/A 02/04/2016   Procedure: RIGHT/LEFT HEART CATH AND CORONARY ANGIOGRAPHY;  Surgeon: Troy Sine, MD;  Location: [place] [facility];  Service: Cardiovascular;  Laterality: N/A;  . CYSTOSCOPY W/ RETROGRADES Bilateral 07/07/2015   Procedure: CYSTOSCOPY WITH RETROGRADE PYELOGRAM;  Surgeon: Cleon Gustin, MD;  Location[facility];  Service: Urology;  Laterality: Bilateral;  . IR GENERIC HISTORICAL  02/07/2016   IR ANGIOGRAM SELECTIVE EACH ADDITIONAL VESSEL 02/07/2016 Jacqulynn Cadet, MD [facility]  . IR GENERIC HISTORICAL  02/07/2016   IR US GUIDE VASC ACCESS RIGHT 02/07/2016 Jacqulynn Cadet, MD [facility]  . IR GENERIC HISTORICAL  02/07/2016   IR ANGIOGRAM PULMONARY BILATERAL SELECTIVE 02/07/2016 Jacqulynn Cadet, MD [facility]  . IR GENERIC HISTORICAL  02/07/2016   IR INFUSION THROMBOL ARTERIAL INITIAL (MS) 02/07/2016 Jacqulynn Cadet, MD [facility]  . IR GENERIC HISTORICAL  02/07/2016   IR INFUSION THROMBOL ARTERIAL INITIAL (MS) 02/07/2016 Jacqulynn Cadet, MD [facility]  . IR GENERIC HISTORICAL  02/07/2016   IR ANGIOGRAM SELECTIVE EACH ADDITIONAL VESSEL 02/07/2016 Jacqulynn Cadet, MD [facility]  . NO PAST SURGERIES    . TRANSURETHRAL RESECTION OF BLADDER TUMOR WITH GYRUS (TURBT-GYRUS) N/A 07/07/2015   Procedure: TRANSURETHRAL RESECTION OF BLADDER TUMOR WITH GYRUS (TURBT-GYRUS);  Surgeon: Cleon Gustin, MD;  Location: [facility];   Service: Urology;  Laterality: N/A;   Social History   Occupational History  . Occupation: Retired Automotive engineer  Tobacco Use  . Smoking status: Never Smoker  . Smokeless tobacco: Never Used  Substance and Sexual Activity  . Alcohol use: No  . Drug use: No  . Sexual activity: Never

## 2018-10-31 ENCOUNTER — Ambulatory Visit: Payer: PPO

## 2018-11-01 ENCOUNTER — Other Ambulatory Visit: Payer: Self-pay | Admitting: Family

## 2018-11-01 NOTE — Telephone Encounter (Signed)
Patient requesting refill of meloxicam. High allergy alert indicated. Routing to provider for approval.

## 2018-11-13 ENCOUNTER — Ambulatory Visit (INDEPENDENT_AMBULATORY_CARE_PROVIDER_SITE_OTHER): Payer: PPO | Admitting: Family

## 2018-11-13 ENCOUNTER — Encounter: Payer: Self-pay | Admitting: Family

## 2018-11-13 ENCOUNTER — Other Ambulatory Visit: Payer: Self-pay

## 2018-11-13 VITALS — BP 128/80 | HR 71 | Temp 97.5°F | Ht 63.0 in | Wt 168.6 lb

## 2018-11-13 DIAGNOSIS — N814 Uterovaginal prolapse, unspecified: Secondary | ICD-10-CM

## 2018-11-13 NOTE — Progress Notes (Signed)
Provider: Marlowe Sax FNP-C  Zykeria Laguardia, Nelda Bucks, NP  Patient Care Team: Charlisa Cham, Nelda Bucks, NP as PCP - General (Family Medicine)  Extended Emergency Contact Information Primary Emergency Contact: Raudenbush,Jeanette Address: [address]           [place], [place] [address] [place] Phone: [phone] Mobile Phone: [phone] Relation: Daughter Secondary Emergency Contact: Chiquita Loth [place] Phone: [phone] Mobile Phone: [phone] Relation: Son  Code Status:  Goals of care: Advanced Directive information Advanced Directives 07/14/2018  Does Patient Have a Medical Advance Directive? Yes  Type of Advance Directive [place]  Does patient want to make changes to medical advance directive? No - Patient declined  Copy of [place] in Chart? No - copy requested  Would patient like information on creating a medical advance directive? No - Patient declined     Chief Complaint  Patient presents with  . Acute Visit    Discomfort from Prolapse patient states she has been dealing with this for a while   . Quality Metric Gaps    patient refused flu vaccine at this time    HPI:  Pt is a 78 y.o. 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    Past Medical History:  Diagnosis Date  . Bladder cancer ([place])    a. s/p resection in 06/2015  . Dysuria   . Frequency of urination   . Hematuria   . Hypertension   . Nocturia   . Wears glasses   . Wears partial dentures    upper   Past Surgical History:  Procedure Laterality Date  . CARDIAC CATHETERIZATION N/A 02/04/2016   Procedure: RIGHT/LEFT HEART CATH AND CORONARY ANGIOGRAPHY;  Surgeon: Troy Sine, MD;  Location: [facility];  Service: Cardiovascular;  Laterality: N/A;  . CYSTOSCOPY W/ RETROGRADES Bilateral 07/07/2015   Procedure: CYSTOSCOPY WITH RETROGRADE PYELOGRAM;  Surgeon: Cleon Gustin, MD;  Location: [facility];  Service: Urology;  Laterality: Bilateral;  . IR GENERIC HISTORICAL  02/07/2016   IR ANGIOGRAM SELECTIVE EACH ADDITIONAL VESSEL 02/07/2016 Jacqulynn Cadet, MD [facility]  . IR GENERIC HISTORICAL  02/07/2016   IR US GUIDE VASC ACCESS RIGHT 02/07/2016 Jacqulynn Cadet, MD [facility]  . IR GENERIC HISTORICAL  02/07/2016   IR ANGIOGRAM PULMONARY BILATERAL SELECTIVE 02/07/2016 Jacqulynn Cadet, MD [facility]  . IR GENERIC HISTORICAL  02/07/2016   IR INFUSION THROMBOL ARTERIAL INITIAL (MS) 02/07/2016 Jacqulynn Cadet, MD [facility]  . IR GENERIC HISTORICAL  02/07/2016   IR INFUSION THROMBOL ARTERIAL INITIAL (MS) 02/07/2016 Jacqulynn Cadet, MD [facility]  . IR GENERIC HISTORICAL  02/07/2016   IR ANGIOGRAM SELECTIVE EACH ADDITIONAL VESSEL 02/07/2016 Jacqulynn Cadet, MD [facility]  . NO PAST SURGERIES    . TRANSURETHRAL RESECTION OF BLADDER TUMOR WITH GYRUS (TURBT-GYRUS) N/A 07/07/2015   Procedure: TRANSURETHRAL RESECTION OF BLADDER TUMOR WITH GYRUS (TURBT-GYRUS);  Surgeon: Cleon Gustin, MD;  Location: [facility];  Service: Urology;  Laterality: N/A;    No Known Allergies  Outpatient Encounter  Medications as of 11/13/2018  Medication Sig  . amLODipine (NORVASC) 10 MG tablet Take 1 tablet (10 mg total) by mouth daily.  . Ascorbic Acid (VITAMIN C) 1000 MG tablet Take 1,000 mg by mouth daily.  Marland Kitchen atorvastatin (LIPITOR) 80 MG tablet TAKE ONE TABLET BY MOUTH ONCE DAILY AT 6PM  . Cholecalciferol (VITAMIN D) 2000 units tablet Take 2,000  Units by mouth daily.  . diclofenac sodium (VOLTAREN) 1 % GEL Apply 2 g topically 4 (four) times daily. To left thumb swelling  . gabapentin (NEURONTIN) 100 MG capsule Take 1-2 capsules (100-200 mg total) by mouth 3 (three) times daily.  . meloxicam (MOBIC) 7.5 MG tablet TAKE 1 TABLET BY MOUTH EVERY DAY  . nitroGLYCERIN (NITROSTAT) 0.4 MG SL tablet Place 1 tablet (0.4 mg total) under the tongue every 5 (five) minutes x 3 doses as needed for chest pain.  . predniSONE (STERAPRED UNI-PAK 21 TAB) 10 MG (21) TBPK tablet Take as directed  . rOPINIRole (REQUIP) 0.5 MG tablet TAKE 1 TABLET (0.5 MG TOTAL) BY MOUTH AT BEDTIME.  Marland Kitchen XARELTO 20 MG TABS tablet TAKE ONE TABLET BY MOUTH ONCE DAILY WITH SUPPER   No facility-administered encounter medications on file as of 11/13/2018.     Review of Systems  Constitutional: Negative for appetite change, chills and fatigue.  Respiratory: Negative for cough, chest tightness, shortness of breath and wheezing.   Cardiovascular: Negative for chest pain, palpitations and leg swelling.  Gastrointestinal: Negative for abdominal distention, abdominal pain, constipation, diarrhea, nausea and vomiting.  Genitourinary: Negative for decreased urine volume, difficulty urinating, dysuria, flank pain, frequency, pelvic pain, urgency, vaginal bleeding, vaginal discharge and vaginal pain.       Reports mass in the vaginal area.  Musculoskeletal:       Chronic back pain    Immunization History  Administered Date(s) Administered  . Influenza,inj,Quad PF,6+ Mos 11/27/2014  . Pneumococcal Conjugate-13 06/13/2017   Pertinent  Health Maintenance Due  Topic Date Due  . MAMMOGRAM  12/10/2017  . PNA vac Low Risk Adult (2 of 2 - PPSV23) 06/14/2018  . INFLUENZA VACCINE  10/14/2018  . DEXA SCAN  Completed   Fall Risk  11/13/2018 10/10/2018 09/14/2018 09/07/2018 07/14/2018  Falls in the past year? 0 0 0 0 0  Number falls in past yr: 0 0 0 0 0  Injury with Fall? 0 0 0 0 0    Vitals:    11/13/18 1310  BP: 128/80  Pulse: 71  Temp: (!) 97.5 F (36.4 C)  TempSrc: Oral  SpO2: 97%  Weight: 168 lb 9.6 oz (76.5 kg)  Height: 5\' 3"  (1.6 m)   Body mass index is 29.87 kg/m. Physical Exam Vitals signs reviewed.  Constitutional:      General: She is not in acute distress.    Appearance: She is not ill-appearing.  Cardiovascular:     Rate and Rhythm: Normal rate and regular rhythm.     Pulses: Normal pulses.     Heart sounds: Normal heart sounds. No murmur. No friction rub. No gallop.   Pulmonary:     Effort: Pulmonary effort is normal. No respiratory distress.     Breath sounds: Normal breath sounds. No wheezing, rhonchi or rales.  Chest:     Chest wall: No tenderness.  Abdominal:     General: Bowel sounds are normal. There is no distension.     Palpations: Abdomen is soft. There is no mass.     Tenderness: There is no abdominal tenderness.  There is no right CVA tenderness, left CVA tenderness, guarding or rebound.     Hernia: There is no hernia in the left inguinal area or right inguinal area.  Genitourinary:    Exam position: Lithotomy position.     Pubic Area: No rash.      Labia:        Right: No rash, tenderness or injury.        Left: No rash, tenderness or injury.      Vagina: No foreign body. No vaginal discharge, erythema, tenderness, bleeding or lesions.     Uterus: With uterine prolapse. Not tender.      Rectum: Normal.     Comments: Large pink mass completely protruding from vagina. Musculoskeletal: Normal range of motion.        General: No swelling or tenderness.     Right lower leg: No edema.     Left lower leg: No edema.  Lymphadenopathy:     Lower Body: No right inguinal adenopathy. No left inguinal adenopathy.  Skin:    General: Skin is warm and dry.     Coloration: Skin is not pale.     Findings: No bruising, erythema or rash.  Neurological:     Mental Status: She is alert and oriented to person, place, and time.     Cranial Nerves: No  cranial nerve deficit.     Sensory: No sensory deficit.     Motor: No weakness.     Coordination: Coordination normal.     Gait: Gait normal.  Psychiatric:        Mood and Affect: Mood normal.        Behavior: Behavior normal.        Thought Content: Thought content normal.        Judgment: Judgment normal.    Labs reviewed: Recent Labs    07/12/18 0830  NA 141  K 4.0  CL 106  CO2 26  GLUCOSE 122*  BUN 8  CREATININE 0.69  CALCIUM 9.4   Recent Labs    07/12/18 0830  AST 28  ALT 21  BILITOT 0.5  PROT 7.7   Recent Labs    07/12/18 0830  WBC 4.0  NEUTROABS 1,832  HGB 12.3  HCT 37.4  MCV 88.4  PLT 276   Lab Results  Component Value Date   TSH 1.71 08/31/2017   No results found for: HGBA1C Lab Results  Component Value Date   CHOL 97 07/12/2018   HDL 36 (L) 07/12/2018   LDLCALC 46 07/12/2018   TRIG 71 07/12/2018   CHOLHDL 2.7 07/12/2018    Significant Diagnostic Results in last 30 days:  Xr Lumbar Spine 2-3 Views  Result Date: 10/19/2018 Advanced degenerative disc disease of L4-5 and L5-S1 with lumbar spondylosis.   Assessment/Plan   Uterine prolapse Afebrile.Large pink mass completely protruding from vagina consistent with uterine prolapse.No tenderness or drainage noted. - Ambulatory referral to Gynecology for evaluation  Family/ staff Communication: Reviewed plan of care with patient and daughter.  Labs/tests ordered: None   Ling Flesch C Daneisha Surges, NP

## 2018-11-13 NOTE — Patient Instructions (Signed)
Pelvic Organ Prolapse Pelvic organ prolapse is the stretching, bulging, or dropping of pelvic organs into an abnormal position. It happens when the muscles and tissues that surround and support pelvic structures become weak or stretched. Pelvic organ prolapse can involve the:  Vagina (vaginal prolapse).  Uterus (uterine prolapse).  Bladder (cystocele).  Rectum (rectocele).  Intestines (enterocele). When organs other than the vagina are involved, they often bulge into the vagina or protrude from the vagina, depending on how severe the prolapse is. What are the causes? This condition may be caused by:  Pregnancy, labor, and childbirth.  Past pelvic surgery.  Decreased production of the hormone estrogen associated with menopause.  Consistently lifting more than 50 lb (23 kg).  Obesity.  Long-term inability to pass stool (chronic constipation).  A cough that lasts a long time (chronic).  Buildup of fluid in the abdomen due to certain diseases and other conditions. What are the signs or symptoms? Symptoms of this condition include:  Passing a little urine (loss of bladder control) when you cough, sneeze, strain, and exercise (stress incontinence). This may be worse immediately after childbirth. It may gradually improve over time.  Feeling pressure in your pelvis or vagina. This pressure may increase when you cough or when you are passing stool.  A bulge that protrudes from the opening of your vagina.  Difficulty passing urine or stool.  Pain in your lower back.  Pain, discomfort, or disinterest in sex.  Repeated bladder infections (urinary tract infections).  Difficulty inserting a tampon. In some people, this condition causes no symptoms. How is this diagnosed? This condition may be diagnosed based on a vaginal and rectal exam. During the exam, you may be asked to cough and strain while you are lying down, sitting, and standing up. Your health care provider will  determine if other tests are required, such as bladder function tests. How is this treated? Treatment for this condition may depend on your symptoms. Treatment may include:  Lifestyle changes, such as changes to your diet.  Emptying your bladder at scheduled times (bladder training therapy). This can help reduce or avoid urinary incontinence.  Estrogen. Estrogen may help mild prolapse by increasing the strength and tone of pelvic floor muscles.  Kegel exercises. These may help mild cases of prolapse by strengthening and tightening the muscles of the pelvic floor.  A soft, flexible device that helps support the vaginal walls and keep pelvic organs in place (pessary). This is inserted into your vagina by your health care provider.  Surgery. This is often the only form of treatment for severe prolapse. Follow these instructions at home:  Avoid drinking beverages that contain caffeine or alcohol.  Increase your intake of high-fiber foods. This can help decrease constipation and straining during bowel movements.  Lose weight if recommended by your health care provider.  Wear a sanitary pad or adult diapers if you have urinary incontinence.  Avoid heavy lifting and straining with exercise and work. Do not hold your breath when you perform mild to moderate lifting and exercise activities. Limit your activities as directed by your health care provider.  Do Kegel exercises as directed by your health care provider. To do this: ? Squeeze your pelvic floor muscles tight. You should feel a tight lift in your rectal area and a tightness in your vaginal area. Keep your stomach, buttocks, and legs relaxed. ? Hold the muscles tight for up to 10 seconds. ? Relax your muscles. ? Repeat this exercise 50 times a day,  or as many times as told by your health care provider. Continue to do this exercise for at least 4-6 weeks, or for as long as told by your health care provider. °· Take over-the-counter and  prescription medicines only as told by your health care provider. °· If you have a pessary, take care of it as told by your health care provider. °· Keep all follow-up visits as told by your health care provider. This is important. °Contact a health care provider if you: °· Have symptoms that interfere with your daily activities or sex life. °· Need medicine to help with the discomfort. °· Notice bleeding from your vagina that is not related to your period. °· Have a fever. °· Have pain or bleeding when you urinate. °· Have bleeding when you pass stool. °· Pass urine when you have sex. °· Have chronic constipation. °· Have a pessary that falls out. °· Have bad smelling vaginal discharge. °· Have an unusual, low pain in your abdomen. °Summary °· Pelvic organ prolapse is the stretching, bulging, or dropping of pelvic organs into an abnormal position. It happens when the muscles and tissues that surround and support pelvic structures become weak or stretched. °· When organs other than the vagina are involved, they often bulge into the vagina or protrude from the vagina, depending on how severe the prolapse is. °· In most cases, this condition needs to be treated only if it produces symptoms. Treatment may include lifestyle changes, estrogen, Kegel exercises, pessary insertion, or surgery. °· Avoid heavy lifting and straining with exercise and work. Do not hold your breath when you perform mild to moderate lifting and exercise activities. Limit your activities as directed by your health care provider. °This information is not intended to replace advice given to you by your health care provider. Make sure you discuss any questions you have with your health care provider. °Document Released: 09/26/2013 Document Revised: 03/23/2017 Document Reviewed: 03/23/2017 °Elsevier Patient Education © 2020 [employer] Inc[employer] ° °

## 2018-11-21 ENCOUNTER — Other Ambulatory Visit: Payer: Self-pay

## 2018-11-22 ENCOUNTER — Other Ambulatory Visit: Payer: Self-pay

## 2018-11-23 ENCOUNTER — Encounter: Payer: Self-pay | Admitting: Gynecology

## 2018-11-23 ENCOUNTER — Ambulatory Visit (INDEPENDENT_AMBULATORY_CARE_PROVIDER_SITE_OTHER): Payer: PPO | Admitting: Gynecology

## 2018-11-23 VITALS — BP 134/82 | Ht 63.5 in | Wt 167.0 lb

## 2018-11-23 DIAGNOSIS — N952 Postmenopausal atrophic vaginitis: Secondary | ICD-10-CM | POA: Diagnosis not present

## 2018-11-23 DIAGNOSIS — N814 Uterovaginal prolapse, unspecified: Secondary | ICD-10-CM

## 2018-11-23 NOTE — Progress Notes (Signed)
    Catherine Snyder 07/20/1940 [id number]        78 y.o.  [id number] new patient referred by her primary provider for uterine prolapse.  Patient notes that she has had something hanging out for years which is bothersome to her.  No urinary symptoms to include incontinence and no bowel issues.  No history of significant GYN issues in the past.  Past medical history,surgical history, problem list, medications, allergies, family history and social history were all reviewed and documented in the EPIC chart.  Directed ROS with pertinent positives and negatives documented in the history of present illness/assessment and plan.  Exam: Copywriter, advertising Vitals:   11/23/18 1021  BP: 134/82  Weight: 167 lb (75.8 kg)  Height: 5' 3.5" (1.613 m)   General appearance:  Normal Abdomen soft nontender without masses guarding rebound Pelvic external BUS vagina with atrophic changes.  Cervix extruding several fingerbreadths from the introitus.  No irritation or erosion on the cervical mucosa.  Cervix replaced.  Vagina vagina without irritation or erosion.  Uterus normal size midline mobile nontender.  Adnexa without masses or tenderness.  Rectal exam is grossly normal  Assessment/Plan:  78 y.o. Catherine [id number] with uterine prolapse for several years.  Bothersome to the patient.  No associated urinary or bowel symptoms.  No significant cystocele or rectocele.  We discussed options to include expectant management, trial of pessary and surgery.  We discussed in general was involved with each choice.  She is not interested in being fitted for pessary today but may return to do so after considering her options.  I recommended referral to Dr. [place] Pink if she decides on a surgical approach.  The patient wants to discuss with her daughter and will follow-up with her decision.   Anastasio Auerbach MD, 10:33 AM 11/23/2018

## 2018-11-23 NOTE — Patient Instructions (Signed)
Call us with your decision about trial of pessary versus surgery.

## 2018-11-28 ENCOUNTER — Ambulatory Visit: Payer: PPO | Admitting: Obstetrics & Gynecology

## 2018-11-28 ENCOUNTER — Other Ambulatory Visit: Payer: Self-pay

## 2018-11-29 ENCOUNTER — Encounter: Payer: Self-pay | Admitting: Gynecology

## 2018-11-29 ENCOUNTER — Ambulatory Visit: Payer: PPO | Admitting: Gynecology

## 2018-11-29 VITALS — BP 124/82

## 2018-11-29 DIAGNOSIS — N814 Uterovaginal prolapse, unspecified: Secondary | ICD-10-CM

## 2018-11-29 DIAGNOSIS — Z4689 Encounter for fitting and adjustment of other specified devices: Secondary | ICD-10-CM

## 2018-11-29 NOTE — Progress Notes (Signed)
    Catherine Snyder Jun 12, 1940 [id number]        77 y.o.  [id number] presents to have a pessary fitted.  Previously evaluated for uterine prolapse and we discussed options to include observation, pessary and surgery.  She is elected for pessary at this time.  Past medical history,surgical history, problem list, medications, allergies, family history and social history were all reviewed and documented in the EPIC chart.  Directed ROS with pertinent positives and negatives documented in the history of present illness/assessment and plan.  Exam: [name] assistant Vitals:   11/29/18 1220  BP: 124/82   General appearance:  Normal Abdomen soft nontender without masses guarding rebound Pelvic external BUS vagina with atrophic changes.  Cervix 2 fingerbreadths out of the introitus.  Uterus grossly normal midline mobile nontender.  Adnexa without masses or tenderness.  Patient was fitted with a #3 ring pessary with support.  She ambulated and strained and maintained good support.  Assessment/Plan:  78 y.o. [id number] with uterine prolapse.  Pessary fitted.  She will return when the permanent pessary arrives for placement.    Anastasio Auerbach MD, 12:54 PM 11/29/2018

## 2018-11-29 NOTE — Patient Instructions (Signed)
The office will call when the permanent pessary becomes available for placement.

## 2018-12-13 ENCOUNTER — Encounter: Payer: Self-pay | Admitting: Gynecology

## 2018-12-13 ENCOUNTER — Other Ambulatory Visit: Payer: Self-pay

## 2018-12-13 ENCOUNTER — Ambulatory Visit (INDEPENDENT_AMBULATORY_CARE_PROVIDER_SITE_OTHER): Payer: PPO | Admitting: Gynecology

## 2018-12-13 VITALS — BP 130/80

## 2018-12-13 DIAGNOSIS — Z4689 Encounter for fitting and adjustment of other specified devices: Secondary | ICD-10-CM

## 2018-12-13 DIAGNOSIS — N814 Uterovaginal prolapse, unspecified: Secondary | ICD-10-CM

## 2018-12-13 NOTE — Patient Instructions (Signed)
Follow-up in 1 month for pessary check.  Sooner if any issues with the pessary

## 2018-12-13 NOTE — Progress Notes (Signed)
    Catherine Snyder 12-Sep-1940 [id number]        78 y.o.  [id number] presents for pessary placement.  She had a #3 pessary fitted for uterine prolapse and comes in to have a placed now.  Past medical history,surgical history, problem list, medications, allergies, family history and social history were all reviewed and documented in the EPIC chart.  Directed ROS with pertinent positives and negatives documented in the history of present illness/assessment and plan.  Exam: [name] assistant Vitals:   12/13/18 1430  BP: 130/80   General appearance:  Normal Abdomen soft nontender without masses guarding rebound Pelvic external BUS vagina with prolapse several fingerbreadths beyond the introitus.  Prolapse replaced bimanual without masses or tenderness.  Ring pessary placed without difficulty.  The patient ambulated and did well having no discomfort with the pessary in place.  Assessment/Plan:  78 y.o. [id number] with uterine prolapse.  Ring pessary placed.  We will follow-up in 1 month for recheck.  We will follow-up sooner if any issues with the pessary.  Discussed after that month we will have her try to remove it and replace it on her own.   Anastasio Auerbach MD, 2:42 PM 12/13/2018

## 2018-12-14 DIAGNOSIS — C670 Malignant neoplasm of trigone of bladder: Secondary | ICD-10-CM | POA: Diagnosis not present

## 2018-12-15 ENCOUNTER — Other Ambulatory Visit: Payer: Self-pay

## 2018-12-15 ENCOUNTER — Ambulatory Visit
Admission: RE | Admit: 2018-12-15 | Discharge: 2018-12-15 | Disposition: A | Discharge status: Home / Self Care | Payer: PPO | Source: Ambulatory Visit | Attending: Family | Admitting: Family

## 2018-12-15 DIAGNOSIS — Z1231 Encounter for screening mammogram for malignant neoplasm of breast: Secondary | ICD-10-CM

## 2018-12-21 ENCOUNTER — Encounter: Payer: Self-pay | Admitting: Gynecology

## 2019-01-03 ENCOUNTER — Ambulatory Visit: Payer: PPO | Admitting: Nurse Practitioner

## 2019-01-08 ENCOUNTER — Other Ambulatory Visit: Payer: Self-pay | Admitting: Family

## 2019-01-08 DIAGNOSIS — I10 Essential (primary) hypertension: Secondary | ICD-10-CM

## 2019-01-10 ENCOUNTER — Other Ambulatory Visit: Payer: Self-pay | Admitting: Nurse Practitioner

## 2019-01-10 ENCOUNTER — Encounter: Payer: Self-pay | Admitting: Gynecology

## 2019-01-10 ENCOUNTER — Ambulatory Visit (INDEPENDENT_AMBULATORY_CARE_PROVIDER_SITE_OTHER): Payer: PPO | Admitting: Gynecology

## 2019-01-10 ENCOUNTER — Telehealth: Payer: Self-pay | Admitting: Family

## 2019-01-10 ENCOUNTER — Other Ambulatory Visit: Payer: Self-pay

## 2019-01-10 VITALS — BP 126/76

## 2019-01-10 DIAGNOSIS — N8189 Other female genital prolapse: Secondary | ICD-10-CM

## 2019-01-10 NOTE — Patient Instructions (Signed)
Follow-up in 3 months for pessary recheck

## 2019-01-10 NOTE — Progress Notes (Signed)
    Catherine Snyder Oct 23, 1940 [id number]        78 y.o.  [id number] presents for pessary recheck.  Had ring pessary placed for uterine prolapse last month.  Has done well without pain or bleeding.  Past medical history,surgical history, problem list, medications, allergies, family history and social history were all reviewed and documented in the EPIC chart.  Directed ROS with pertinent positives and negatives documented in the history of present illness/assessment and plan.  Exam: [name] assistant Vitals:   01/10/19 1433  BP: 126/76   General appearance:  Normal Abdomen soft nontender without masses guarding rebound Pelvic external BUS vagina with atrophic changes.  Ring pessary was removed.  Uterus telescopes down vaginal canal to introital opening.  Cervix normal with atrophic changes.  Bimanual shows uterus to be grossly normal size midline mobile nontender.  Adnexa without masses or tenderness.  Her ring pessary was removed cleansed and replaced without difficulty.  Assessment/Plan:  78 y.o. [id number] with ring pessary for uterine prolapse doing well.  Recommend patient follow-up in 3 months.  Sooner if any issues.    Anastasio Auerbach MD, 2:42 PM 01/10/2019

## 2019-01-10 NOTE — Telephone Encounter (Signed)
[employer] [facility] call report indicates patient due for Influenza vaccine,Tdap and PNA vac are due.please  Call patient to schedule appointment for vaccine.

## 2019-02-14 NOTE — Telephone Encounter (Signed)
Form received from [place], but another provider was populated on forms.  Called [employer] and asked them to resend forms.

## 2019-02-22 ENCOUNTER — Other Ambulatory Visit: Payer: Self-pay | Admitting: Family

## 2019-03-05 ENCOUNTER — Other Ambulatory Visit: Payer: Self-pay

## 2019-03-05 ENCOUNTER — Encounter: Payer: Self-pay | Admitting: Adult Health

## 2019-03-05 ENCOUNTER — Ambulatory Visit (INDEPENDENT_AMBULATORY_CARE_PROVIDER_SITE_OTHER): Payer: PPO | Admitting: Adult Health

## 2019-03-05 VITALS — BP 130/88 | HR 65 | Temp 97.1°F | Ht 63.5 in | Wt 171.4 lb

## 2019-03-05 DIAGNOSIS — I2510 Atherosclerotic heart disease of native coronary artery without angina pectoris: Secondary | ICD-10-CM | POA: Diagnosis not present

## 2019-03-05 DIAGNOSIS — I2692 Saddle embolus of pulmonary artery without acute cor pulmonale: Secondary | ICD-10-CM

## 2019-03-05 DIAGNOSIS — R040 Epistaxis: Secondary | ICD-10-CM | POA: Diagnosis not present

## 2019-03-05 DIAGNOSIS — I10 Essential (primary) hypertension: Secondary | ICD-10-CM

## 2019-03-05 DIAGNOSIS — I2782 Chronic pulmonary embolism: Secondary | ICD-10-CM

## 2019-03-05 LAB — CBC WITH DIFFERENTIAL/PLATELET
Absolute Monocytes: 669 cells/uL (ref 200–950)
Basophils Absolute: 9 cells/uL (ref 0–200)
Basophils Relative: 0.2 %
Eosinophils Absolute: 101 cells/uL (ref 15–500)
Eosinophils Relative: 2.3 %
HCT: 38.7 % (ref 35.0–45.0)
Hemoglobin: 12.7 g/dL (ref 11.7–15.5)
Lymphs Abs: 1549 cells/uL (ref 850–3900)
MCH: 29.7 pg (ref 27.0–33.0)
MCHC: 32.8 g/dL (ref 32.0–36.0)
MCV: 90.6 fL (ref 80.0–100.0)
MPV: 10 fL (ref 7.5–12.5)
Monocytes Relative: 15.2 %
Neutro Abs: 2072 cells/uL (ref 1500–7800)
Neutrophils Relative %: 47.1 %
Platelets: 271 10*3/uL (ref 140–400)
RBC: 4.27 10*6/uL (ref 3.80–5.10)
RDW: 13.7 % (ref 11.0–15.0)
Total Lymphocyte: 35.2 %
WBC: 4.4 10*3/uL (ref 3.8–10.8)

## 2019-03-05 NOTE — Patient Instructions (Addendum)
Nosebleed, Adult A nosebleed is when blood comes out of the nose. Nosebleeds are common. Usually, they are not a sign of a serious condition. Nosebleeds can happen if a small blood vessel in your nose starts to bleed or if the lining of your nose (mucous membrane) cracks. They are commonly caused by:  Allergies.  Colds.  Picking your nose.  Blowing your nose too hard.  An injury from sticking an object into your nose or getting hit in the nose.  Dry or cold air. Less common causes of nosebleeds include:  Toxic fumes.  Something abnormal in the nose or in the air-filled spaces in the bones of the face (sinuses).  Growths in the nose, such as polyps.  Medicines or conditions that cause blood to clot slowly.  Certain illnesses or procedures that irritate or dry out the nasal passages. Follow these instructions at home: When you have a nosebleed:   Sit down and tilt your head slightly forward.  Use a clean towel or tissue to pinch your nostrils under the bony part of your nose. After 10 minutes, let go of your nose and see if bleeding starts again. Do not release pressure before that time. If there is still bleeding, repeat the pinching and holding for 10 minutes until the bleeding stops.  Do not place tissues or gauze in the nose to stop bleeding.  Avoid lying down and avoid tilting your head backward. That may make blood collect in the throat and cause gagging or coughing.  Use a nasal spray decongestant to help with a nosebleed as told by your health care provider.  Do not use petroleum jelly or mineral oil in your nose. It can drip into your lungs. After a nosebleed:  Avoid blowing your nose or sniffing for a number of hours.  Avoid straining, lifting, or bending at the waist for several days. You may resume other normal activities as you are able.  Use saline spray or a humidifier as told by your health care provider.  Aspirinand blood thinners make bleeding more  likely. If you are prescribed these medicines and you suffer from nosebleeds: ? Ask your health care provider if you should stop taking the medicines or if you should adjust the dose. ? Do not stop taking medicines that your health care provider has recommended unless told by your health care provider.  If your nosebleed was caused by dry mucous membranes, use over-the-counter saline nasal spray or gel. This will keep the mucous membranes moist and allow them to heal. If you must use a lubricant: ? Choose one that is water-soluble. ? Use only as much as you need and use it only as often as needed. ? Do not lie down until several hours after you use it. Contact a health care provider if:  You have a fever.  You get nosebleeds often or more often than usual.  You bruise very easily.  You have a nosebleed from having something stuck in your nose.  You have bleeding in your mouth.  You vomit or cough up brown material.  You have a nosebleed after you start a new medicine. Get help right away if:  You have a nosebleed after a fall or a head injury.  Your nosebleed does not go away after 20 minutes.  You feel dizzy or weak.  You have unusual bleeding from other parts of your body.  You have unusual bruising on other parts of your body.  You become sweaty.  You   vomit blood. This information is not intended to replace advice given to you by your health care provider. Make sure you discuss any questions you have with your health care provider. Document Released: 12/09/2004 Document Revised: 05/31/2017 Document Reviewed: 09/16/2015 Elsevier Patient Education  [place] nasal spray instill 2 sprays to left nostril twice a day as needed for less than equal to 3 days only.  If nose bleed happens again, may need to refer to ENT  - Need to discontinue Mobic  - Discuss Xarelto with cardiology  - if nasal bleeding does not stop, please folow up or go to  ER

## 2019-03-05 NOTE — Progress Notes (Signed)
[facility]  Provider: Durenda Age - NP  Code Status:  Full Code  Goals of Care:  Advanced Directives 07/14/2018  Does Patient Have a Medical Advance Directive? Yes  Type of Advance Directive [place]  Does patient want to make changes to medical advance directive? No - Patient declined  Copy of [place] in Chart? No - copy requested  Would patient like information on creating a medical advance directive? No - Patient declined     Chief Complaint  Patient presents with  . Acute Visit    Patient c/o nosebleeds, started last night and stopped and started this morning.   . Medication Management    would like to discuss xarelto     HPI: Patient is a 78 y.o. female seen today for an acute visit for nasal bleed. She had nasal bleed 2 days ago which stopped immediately. She had another nasal bleed last night which stopped after 30 minutes. She put brown paper paper between her teeth and upper lip which she said is her home remedy for nasal bleed. She also uses Afrin nasal spray PRN. She had another nasal bleed this morning which stopped after 15 minutes. She takes Xarelto for for PE. She has been taking Xarelto for 6 years. She said that she has made an appointment with her cardiologist, Dr. Alyson Ingles, in January 2021. She said that she cannot stand dry mucus in her nose so she tries to clean it. She is also taking Mobic for knee pain. She has heater on and has a humidifier at home. She said that her BPs are not elevated at home when she checks it. She takes Amlodipine for hypertension.   Past Medical History:  Diagnosis Date  . Bladder cancer ([place])    a. s/p resection in 06/2015  . Dysuria   . Frequency of urination   . Hematuria   . Hypertension   . Nocturia   . Wears glasses   . Wears partial dentures    upper    Past Surgical History:  Procedure Laterality Date  . CARDIAC CATHETERIZATION N/A 02/04/2016   Procedure: RIGHT/LEFT  HEART CATH AND CORONARY ANGIOGRAPHY;  Surgeon: Troy Sine, MD;  Location: [facility];  Service: Cardiovascular;  Laterality: N/A;  . CYSTOSCOPY W/ RETROGRADES Bilateral 07/07/2015   Procedure: CYSTOSCOPY WITH RETROGRADE PYELOGRAM;  Surgeon: Cleon Gustin, MD;  Location: [facility];  Service: Urology;  Laterality: Bilateral;  . IR GENERIC HISTORICAL  02/07/2016   IR ANGIOGRAM SELECTIVE EACH ADDITIONAL VESSEL 02/07/2016 Jacqulynn Cadet, MD [facility]  . IR GENERIC HISTORICAL  02/07/2016   IR US GUIDE VASC ACCESS RIGHT 02/07/2016 Jacqulynn Cadet, MD [facility]  . IR GENERIC HISTORICAL  02/07/2016   IR ANGIOGRAM PULMONARY BILATERAL SELECTIVE 02/07/2016 Jacqulynn Cadet, MD [facility]  . IR GENERIC HISTORICAL  02/07/2016   IR INFUSION THROMBOL ARTERIAL INITIAL (MS) 02/07/2016 Jacqulynn Cadet, MD [facility]  . IR GENERIC HISTORICAL  02/07/2016   IR INFUSION THROMBOL ARTERIAL INITIAL (MS) 02/07/2016 Jacqulynn Cadet, MD [facility]  . IR GENERIC HISTORICAL  02/07/2016   IR ANGIOGRAM SELECTIVE EACH ADDITIONAL VESSEL 02/07/2016 Jacqulynn Cadet, MD [facility]  . NO PAST SURGERIES    . TRANSURETHRAL RESECTION OF BLADDER TUMOR WITH GYRUS (TURBT-GYRUS) N/A 07/07/2015   Procedure: TRANSURETHRAL RESECTION OF BLADDER TUMOR WITH GYRUS (TURBT-GYRUS);  Surgeon: Cleon Gustin, MD;  Location: [facility];  Service: Urology;  Laterality: N/A;  No Known Allergies  Outpatient Encounter Medications as of 03/05/2019  Medication Sig  . amLODipine (NORVASC) 10 MG tablet TAKE 1 TABLET BY MOUTH EVERY DAY  . Ascorbic Acid (VITAMIN C) 1000 MG tablet Take 1,000 mg by mouth daily.  Marland Kitchen atorvastatin (LIPITOR) 80 MG tablet TAKE ONE TABLET BY MOUTH ONCE DAILY AT 6PM  . Cholecalciferol (VITAMIN D) 2000 units tablet Take 2,000 Units by mouth daily.  . diclofenac sodium (VOLTAREN) 1 % GEL Apply 2 g topically 4 (four) times daily. To left thumb swelling  .  gabapentin (NEURONTIN) 100 MG capsule Take 1-2 capsules (100-200 mg total) by mouth 3 (three) times daily.  . meloxicam (MOBIC) 7.5 MG tablet TAKE 1 TABLET BY MOUTH EVERY DAY  . nitroGLYCERIN (NITROSTAT) 0.4 MG SL tablet Place 1 tablet (0.4 mg total) under the tongue every 5 (five) minutes x 3 doses as needed for chest pain.  . predniSONE (STERAPRED UNI-PAK 21 TAB) 10 MG (21) TBPK tablet Take as directed  . rOPINIRole (REQUIP) 0.5 MG tablet TAKE 1 TABLET (0.5 MG TOTAL) BY MOUTH AT BEDTIME.  Marland Kitchen XARELTO 20 MG TABS tablet TAKE ONE TABLET BY MOUTH ONCE DAILY WITH SUPPER   No facility-administered encounter medications on file as of 03/05/2019.    Review of Systems:  Review of Systems  Constitutional: Negative for appetite change, chills, fever and unexpected weight change.  HENT: Positive for nosebleeds and rhinorrhea. Negative for congestion, ear pain, hearing loss, postnasal drip, sinus pressure, sinus pain, sneezing and sore throat.   Eyes: Negative.  Negative for pain, discharge and itching.  Respiratory: Negative for chest tightness, shortness of breath and wheezing.   Cardiovascular: Negative for chest pain, palpitations and leg swelling.  Gastrointestinal: Negative.  Negative for abdominal distention, abdominal pain, blood in stool, constipation and nausea.  Endocrine: Negative.   Genitourinary: Negative.   Musculoskeletal: Negative for back pain and gait problem.  Skin: Negative.  Negative for pallor and wound.  Allergic/Immunologic: Negative.   Neurological: Negative.  Negative for dizziness, light-headedness, numbness and headaches.  Psychiatric/Behavioral: Negative.     Health Maintenance  Topic Date Due  . PNA vac Low Risk Adult (2 of 2 - PPSV23) 06/14/2018  . INFLUENZA VACCINE  10/14/2018  . TETANUS/TDAP  04/16/2019 (Originally 01/07/1960)  . MAMMOGRAM  12/14/2020  . DEXA SCAN  Completed    Physical Exam: Vitals:   03/05/19 1418  BP: 130/88  Pulse: 65  Temp: (!) 97.1  F (36.2 C)  TempSrc: Temporal  SpO2: 98%  Weight: 171 lb 6.4 oz (77.7 kg)  Height: 5' 3.5" (1.613 m)   Body mass index is 29.89 kg/m. Physical Exam Constitutional:      Appearance: Normal appearance. She is obese.  HENT:     Head: Normocephalic and atraumatic.     Nose: Rhinorrhea present.     Comments: Redness noted on left nostril, clear nasal drainage Cardiovascular:     Rate and Rhythm: Normal rate and regular rhythm.     Pulses: Normal pulses.     Heart sounds: Normal heart sounds. No murmur.  Pulmonary:     Effort: Pulmonary effort is normal.     Breath sounds: Normal breath sounds. No wheezing or rhonchi.  Abdominal:     General: Bowel sounds are normal. There is no distension.     Palpations: Abdomen is soft. There is no mass.  Musculoskeletal:        General: No swelling or tenderness. Normal range of motion.  Cervical back: Normal range of motion. No rigidity.     Right lower leg: No edema.     Left lower leg: No edema.  Skin:    General: Skin is warm and dry.  Neurological:     General: No focal deficit present.     Mental Status: She is alert and oriented to person, place, and time.  Psychiatric:        Mood and Affect: Mood normal.        Behavior: Behavior normal.        Thought Content: Thought content normal.        Judgment: Judgment normal.     Labs reviewed: Basic Metabolic Panel: Recent Labs    07/12/18 0830  NA 141  K 4.0  CL 106  CO2 26  GLUCOSE 122*  BUN 8  CREATININE 0.69  CALCIUM 9.4   Liver Function Tests: Recent Labs    07/12/18 0830  AST 28  ALT 21  BILITOT 0.5  PROT 7.7   CBC: Recent Labs    07/12/18 0830  WBC 4.0  NEUTROABS 1,832  HGB 12.3  HCT 37.4  MCV 88.4  PLT 276   Lipid Panel: Recent Labs    07/12/18 0830  CHOL 97  HDL 36*  LDLCALC 46  TRIG 71  CHOLHDL 2.7     Assessment/Plan  1. Epistaxis -  Will need to discontinue Mobic due to epistaxis, redness noted on left nostril but no active  bleeding -  Continue Afrin nasal spray instill 2 sprays to left nostril twice a day as needed for less than equal to 3 days only. - Discuss Xarelto with cardiology - if nasal bleeding does not stop, please folow up the office or go to ER If nose bleed happens again, may need to refer to ENT  - CBC with Differential/Platelets  2. Chronic saddle pulmonary embolism without acute cor pulmonale (HCC) - will continue Xarelto   3. CAD in native artery - continue Xarelto, Atorvastatin,  - Has a follow up appointment with Cardiology in January 2021, discuss Xarelto  4. Essential hypertension - well-controlled, continue Amlodipine   Labs/tests ordered: CBC   Next appt:  03/19/2019

## 2019-03-06 NOTE — Progress Notes (Signed)
Hgb and platelet within normal. Needs to notify office if epistaxis becomes persistent. In that case, ENT referral is needed.

## 2019-03-19 ENCOUNTER — Encounter: Payer: Self-pay | Admitting: Family

## 2019-03-19 ENCOUNTER — Other Ambulatory Visit: Payer: Self-pay

## 2019-03-19 ENCOUNTER — Ambulatory Visit (INDEPENDENT_AMBULATORY_CARE_PROVIDER_SITE_OTHER): Payer: PPO | Admitting: Family

## 2019-03-19 ENCOUNTER — Ambulatory Visit: Payer: PPO | Admitting: Nurse Practitioner

## 2019-03-19 VITALS — BP 132/78 | HR 65 | Temp 97.3°F | Ht 64.0 in | Wt 174.6 lb

## 2019-03-19 DIAGNOSIS — I10 Essential (primary) hypertension: Secondary | ICD-10-CM | POA: Diagnosis not present

## 2019-03-19 DIAGNOSIS — G2581 Restless legs syndrome: Secondary | ICD-10-CM

## 2019-03-19 DIAGNOSIS — E782 Mixed hyperlipidemia: Secondary | ICD-10-CM | POA: Diagnosis not present

## 2019-03-19 DIAGNOSIS — M1812 Unilateral primary osteoarthritis of first carpometacarpal joint, left hand: Secondary | ICD-10-CM | POA: Diagnosis not present

## 2019-03-19 DIAGNOSIS — R040 Epistaxis: Secondary | ICD-10-CM | POA: Diagnosis not present

## 2019-03-19 LAB — CBC
HCT: 36.4 % (ref 35.0–45.0)
Hemoglobin: 12.1 g/dL (ref 11.7–15.5)
MCH: 29.7 pg (ref 27.0–33.0)
MCHC: 33.2 g/dL (ref 32.0–36.0)
MCV: 89.2 fL (ref 80.0–100.0)
MPV: 10.3 fL (ref 7.5–12.5)
Platelets: 259 10*3/uL (ref 140–400)
RBC: 4.08 10*6/uL (ref 3.80–5.10)
RDW: 13.4 % (ref 11.0–15.0)
WBC: 3.8 10*3/uL (ref 3.8–10.8)

## 2019-03-19 NOTE — Progress Notes (Signed)
Provider: Marlowe Sax FNP-C   Christophr Calix, Nelda Bucks, NP  Patient Care Team: Jahad Old, Nelda Bucks, NP as PCP - General (Family Medicine)  Extended Emergency Contact Information Primary Emergency Contact: Bowditch,Jeanette Address: [address]           [place], [place] [address] [place] Phone: [phone] Mobile Phone: [phone] Relation: Daughter Secondary Emergency Contact: Chiquita Loth [place] Phone: [phone] Mobile Phone: [phone] Relation: Son  Code Status: Full Code  Goals of care: Advanced Directive information Advanced Directives 07/14/2018  Does Patient Have a Medical Advance Directive? Yes  Type of Advance Directive [place]  Does patient want to make changes to medical advance directive? No - Patient declined  Copy of [place] in Chart? No - copy requested  Would patient like information on creating a medical advance directive? No - Patient declined     Chief Complaint  Patient presents with  . Medical Management of Chronic Issues    6 month follow up, patient reports no nosebleeds and she feels good    HPI:  Pt is a 79 y.o. female seen today for 6 month follow up for medical management of chronic diseases. Patient was seen by Genia Plants for nose bleed 03/05/2019.Mobic discontinued.she advised to discuss Xarelto with cardiology and continue on Afrin nasal spray.she denies any nosebleeds this visit.States feels good.  Hypertension - states daughter checks her blood pressure who is an NP  twice per week.No blood pressure log for review.states blood pressure has been normal.states watches her salt intake.Has cut down on pork and tries to eat [place] instead.she eats veggies but states her daughter thinks it's not enough." still working on it". She has also cut down on her coffee to 1 cup per day.on amlodipine 10 mg tablet daily.Has Nitro for chest pain but has not required  it.  Hyperlipidemia - takes atorvastatin 80 mg tablet daily.Previous cholesterol,TRG and LDL was within normal range.she is fasting today for recheck of labs.Has modified her diet as above.she denies any muscle weakness or aches.  Restless leg syndrome - states Requip 0.5 mg tablet at bedtime and gabapentin 100-200 mg capsule three times daily relieves symptoms.   Pulmonary Embolism - continue to follow up with Cardiologist.On Xarelto 20 mg tablet daily.Had recent nose bleed but resolved.will continue to monitor.she denies any chest pain,palpitation or shortness of breath.   She continues to follow up with Gynecologist Dr.Fontaine Christia Reading for uterine prolapse.she had a ring pessary insertion 11/2018.she denies any bleeding or abdominal pain.Last seen 01/10/2019 recommended to follow up in 3 months.    Osteoarthritis - worst on left hand.voltaren Gel has been effective also uses other OTC topical analgesics with much relief.   Past Medical History:  Diagnosis Date  . Bladder cancer ([place])    a. s/p resection in 06/2015  . Dysuria   . Frequency of urination   . Hematuria   . Hypertension   . Nocturia   . Wears glasses   . Wears partial dentures    upper   Past Surgical History:  Procedure Laterality Date  . CARDIAC CATHETERIZATION N/A 02/04/2016   Procedure: RIGHT/LEFT HEART CATH AND CORONARY ANGIOGRAPHY;  Surgeon: Troy Sine, MD;  Location: [facility];  Service: Cardiovascular;  Laterality: N/A;  . CYSTOSCOPY W/ RETROGRADES Bilateral 07/07/2015   Procedure: CYSTOSCOPY WITH RETROGRADE PYELOGRAM;  Surgeon: Cleon Gustin, MD;  Location: [facility];  Service: Urology;  Laterality:  Bilateral;  . IR GENERIC HISTORICAL  02/07/2016   IR ANGIOGRAM SELECTIVE EACH ADDITIONAL VESSEL 02/07/2016 Jacqulynn Cadet, MD [facility]  . IR GENERIC HISTORICAL  02/07/2016   IR US GUIDE VASC ACCESS RIGHT 02/07/2016 Jacqulynn Cadet, MD [facility]  . IR GENERIC  HISTORICAL  02/07/2016   IR ANGIOGRAM PULMONARY BILATERAL SELECTIVE 02/07/2016 Jacqulynn Cadet, MD [facility]  . IR GENERIC HISTORICAL  02/07/2016   IR INFUSION THROMBOL ARTERIAL INITIAL (MS) 02/07/2016 Jacqulynn Cadet, MD [facility]  . IR GENERIC HISTORICAL  02/07/2016   IR INFUSION THROMBOL ARTERIAL INITIAL (MS) 02/07/2016 Jacqulynn Cadet, MD [facility]  . IR GENERIC HISTORICAL  02/07/2016   IR ANGIOGRAM SELECTIVE EACH ADDITIONAL VESSEL 02/07/2016 Jacqulynn Cadet, MD [facility]  . NO PAST SURGERIES    . TRANSURETHRAL RESECTION OF BLADDER TUMOR WITH GYRUS (TURBT-GYRUS) N/A 07/07/2015   Procedure: TRANSURETHRAL RESECTION OF BLADDER TUMOR WITH GYRUS (TURBT-GYRUS);  Surgeon: Cleon Gustin, MD;  Location: [facility];  Service: Urology;  Laterality: N/A;    No Known Allergies  Allergies as of 03/19/2019   No Known Allergies     Medication List       Accurate as of March 19, 2019  9:39 AM. If you have any questions, ask your nurse or doctor.        STOP taking these medications   predniSONE 10 MG (21) Tbpk tablet Commonly known as: STERAPRED UNI-PAK 21 TAB Stopped by: Nelda Bucks Rewa Weissberg, NP     TAKE these medications   amLODipine 10 MG tablet Commonly known as: NORVASC TAKE 1 TABLET BY MOUTH EVERY DAY   atorvastatin 80 MG tablet Commonly known as: LIPITOR TAKE ONE TABLET BY MOUTH ONCE DAILY AT 6PM   diclofenac Sodium 1 % Gel Commonly known as: VOLTAREN Apply 2 g topically as needed. What changed: Another medication with the same name was removed. Continue taking this medication, and follow the directions you see here. Changed by: Sandrea Hughs, NP   gabapentin 100 MG capsule Commonly known as: NEURONTIN Take 1-2 capsules (100-200 mg total) by mouth 3 (three) times daily.   nitroGLYCERIN 0.4 MG SL tablet Commonly known as: NITROSTAT Place 1 tablet (0.4 mg total) under the tongue every 5 (five) minutes x 3 doses as needed for chest  pain.   rOPINIRole 0.5 MG tablet Commonly known as: REQUIP TAKE 1 TABLET (0.5 MG TOTAL) BY MOUTH AT BEDTIME.   vitamin C 1000 MG tablet Take 1,000 mg by mouth daily.   Vitamin D 50 MCG (2000 UT) tablet Take 2,000 Units by mouth daily.   Xarelto 20 MG Tabs tablet Generic drug: rivaroxaban TAKE ONE TABLET BY MOUTH ONCE DAILY WITH SUPPER       Review of Systems  Constitutional: Negative for appetite change, chills, fatigue and fever.  HENT: Negative for congestion, hearing loss, postnasal drip, rhinorrhea, sinus pressure, sinus pain, sneezing, sore throat and trouble swallowing.   Eyes: Negative for pain, discharge, redness and itching.       Wears eye glasses hx of right cataract removal follow ups with Opthalkmology  Respiratory: Negative for cough, chest tightness, shortness of breath and wheezing.   Cardiovascular: Negative for chest pain, palpitations and leg swelling.  Gastrointestinal: Negative for abdominal distention, abdominal pain, blood in stool, constipation, diarrhea, nausea and vomiting.  Endocrine: Negative for cold intolerance, heat intolerance, polydipsia, polyphagia and polyuria.  Genitourinary: Negative for difficulty urinating, dysuria, flank pain, frequency, urgency and vaginal bleeding.  Musculoskeletal: Positive for arthralgias.  Negative for back pain and gait problem.       Left hand arthritic pain diclofenac  gel effective   Skin: Negative for color change, pallor and rash.  Neurological: Negative for dizziness, syncope, speech difficulty, weakness, light-headedness, numbness and headaches.  Hematological: Does not bruise/bleed easily.  Psychiatric/Behavioral: Negative for agitation, confusion and sleep disturbance. The patient is not nervous/anxious.     Immunization History  Administered Date(s) Administered  . Influenza,inj,Quad PF,6+ Mos 11/27/2014  . Pneumococcal Conjugate-13 06/13/2017   Pertinent  Health Maintenance Due  Topic Date Due  . PNA  vac Low Risk Adult (2 of 2 - PPSV23) 03/18/2020 (Originally 06/14/2018)  . INFLUENZA VACCINE  06/12/2020 (Originally 10/14/2018)  . MAMMOGRAM  12/14/2020  . DEXA SCAN  Completed   Fall Risk  03/19/2019 03/05/2019 11/13/2018 10/10/2018 09/14/2018  Falls in the past year? 0 0 0 0 0  Number falls in past yr: 0 0 0 0 0  Injury with Fall? 0 0 0 0 0    Vitals:   03/19/19 0926  BP: 132/78  Pulse: 65  Temp: (!) 97.3 F (36.3 C)  TempSrc: Temporal  SpO2: 98%  Weight: 174 lb 9.6 oz (79.2 kg)  Height: 5\' 4"  (1.626 m)   Body mass index is 29.97 kg/m. Physical Exam Vitals reviewed.  Constitutional:      General: She is not in acute distress.    Appearance: She is overweight. She is not ill-appearing.  HENT:     Head: Normocephalic.     Right Ear: Tympanic membrane, ear canal and external ear normal. There is no impacted cerumen.     Left Ear: Tympanic membrane, ear canal and external ear normal. There is no impacted cerumen.     Nose: Nose normal. No congestion or rhinorrhea.     Mouth/Throat:     Mouth: Mucous membranes are moist.     Pharynx: Oropharynx is clear. No oropharyngeal exudate or posterior oropharyngeal erythema.  Eyes:     General: No scleral icterus.       Right eye: No discharge.        Left eye: No discharge.     Extraocular Movements: Extraocular movements intact.     Conjunctiva/sclera: Conjunctivae normal.     Pupils: Pupils are equal, round, and reactive to light.  Neck:     Vascular: No carotid bruit.  Cardiovascular:     Rate and Rhythm: Normal rate and regular rhythm.     Pulses: Normal pulses.     Heart sounds: Normal heart sounds. No murmur. No friction rub. No gallop.   Pulmonary:     Effort: Pulmonary effort is normal. No respiratory distress.     Breath sounds: Normal breath sounds. No wheezing, rhonchi or rales.  Chest:     Chest wall: No tenderness.  Abdominal:     General: Bowel sounds are normal. There is no distension.     Palpations: Abdomen is  soft. There is no mass.     Tenderness: There is no abdominal tenderness. There is no right CVA tenderness, left CVA tenderness, guarding or rebound.  Musculoskeletal:        General: No swelling or tenderness. Normal range of motion.     Cervical back: Normal range of motion. No rigidity or tenderness.     Right lower leg: No edema.     Left lower leg: No edema.     Comments: Arthritic changes to fingers.  Lymphadenopathy:     Cervical: No cervical  adenopathy.  Skin:    General: Skin is warm and dry.     Coloration: Skin is not pale.     Findings: No bruising, erythema, lesion or rash.  Neurological:     Mental Status: She is alert and oriented to person, place, and time.     Sensory: No sensory deficit.     Motor: No weakness.     Coordination: Coordination normal.     Gait: Gait normal.  Psychiatric:        Mood and Affect: Mood normal.        Behavior: Behavior normal.        Thought Content: Thought content normal.        Judgment: Judgment normal.    Labs reviewed: Recent Labs    07/12/18 0830  NA 141  K 4.0  CL 106  CO2 26  GLUCOSE 122*  BUN 8  CREATININE 0.69  CALCIUM 9.4   Recent Labs    07/12/18 0830  AST 28  ALT 21  BILITOT 0.5  PROT 7.7   Recent Labs    07/12/18 0830 03/05/19 1502  WBC 4.0 4.4  NEUTROABS 1,832 2,072  HGB 12.3 12.7  HCT 37.4 38.7  MCV 88.4 90.6  PLT 276 271   Lab Results  Component Value Date   TSH 1.71 08/31/2017   No results found for: HGBA1C Lab Results  Component Value Date   CHOL 97 07/12/2018   HDL 36 (L) 07/12/2018   LDLCALC 46 07/12/2018   TRIG 71 07/12/2018   CHOLHDL 2.7 07/12/2018    Significant Diagnostic Results in last 30 days:  No results found.  Assessment/Plan  1. Essential hypertension B/p at goal.continue on amlodipine 10 mg tablet daily.on Nitro for chest pain.Advised to avoid extra salt on food.Heart healthy diet advised.   2. Mixed hyperlipidemia Latest LDL at goal.continue on  atorvastatin 80 mg tablet daily.Recommeded low carbohydrates,low saturated fats and high vegetables diet.Also advised to increase physical activity or exercise at least x 3 times per week for 30 minutes.Has fasting labs to be drawn today.   3. RLS (restless legs syndrome) Continue on Requip 0.5 mg tablet at bedtime and gabapentin 100-200 mg capsule three times daily   4. Primary osteoarthritis of first carpometacarpal joint of left hand Continue current pain regimen.   Family/ staff Communication: Reviewed plan of care with patient.  Labs/tests ordered: fasting labs to be drawn today.   Next appointment: 6 months for medical management of chronic issues.   Sandrea Hughs, NP

## 2019-04-09 ENCOUNTER — Other Ambulatory Visit: Payer: Self-pay | Admitting: Family

## 2019-04-09 DIAGNOSIS — G2581 Restless legs syndrome: Secondary | ICD-10-CM

## 2019-04-12 ENCOUNTER — Ambulatory Visit (INDEPENDENT_AMBULATORY_CARE_PROVIDER_SITE_OTHER): Payer: PPO | Admitting: Cardiovascular Disease

## 2019-04-12 ENCOUNTER — Encounter: Payer: Self-pay | Admitting: Obstetrics and Gynecology

## 2019-04-12 ENCOUNTER — Other Ambulatory Visit: Payer: Self-pay

## 2019-04-12 ENCOUNTER — Encounter: Payer: Self-pay | Admitting: Cardiovascular Disease

## 2019-04-12 ENCOUNTER — Ambulatory Visit: Payer: PPO | Admitting: Obstetrics and Gynecology

## 2019-04-12 VITALS — BP 124/82

## 2019-04-12 DIAGNOSIS — I2699 Other pulmonary embolism without acute cor pulmonale: Secondary | ICD-10-CM

## 2019-04-12 DIAGNOSIS — I10 Essential (primary) hypertension: Secondary | ICD-10-CM

## 2019-04-12 DIAGNOSIS — G2581 Restless legs syndrome: Secondary | ICD-10-CM | POA: Diagnosis not present

## 2019-04-12 DIAGNOSIS — N814 Uterovaginal prolapse, unspecified: Secondary | ICD-10-CM

## 2019-04-12 DIAGNOSIS — N8189 Other female genital prolapse: Secondary | ICD-10-CM | POA: Diagnosis not present

## 2019-04-12 DIAGNOSIS — Z7901 Long term (current) use of anticoagulants: Secondary | ICD-10-CM

## 2019-04-12 DIAGNOSIS — T45515A Adverse effect of anticoagulants, initial encounter: Secondary | ICD-10-CM

## 2019-04-12 DIAGNOSIS — I272 Pulmonary hypertension, unspecified: Secondary | ICD-10-CM | POA: Diagnosis not present

## 2019-04-12 DIAGNOSIS — E782 Mixed hyperlipidemia: Secondary | ICD-10-CM | POA: Diagnosis not present

## 2019-04-12 DIAGNOSIS — R0609 Other forms of dyspnea: Secondary | ICD-10-CM

## 2019-04-12 DIAGNOSIS — R0600 Dyspnea, unspecified: Secondary | ICD-10-CM | POA: Diagnosis not present

## 2019-04-12 NOTE — Patient Instructions (Signed)
Please make a follow up appointment for a pessary fitting I am happy to talk with your daughter about our discussion today (would need to clear with HIPPA forms first)

## 2019-04-12 NOTE — Progress Notes (Signed)
   Catherine Snyder  Jan 29, 1941 [id number]  HPI The patient is a 79 y.o. [phone] with a fairly complex medical history who presents today for discussion of further management of uterine prolapse.    She was fitted for a ring pessary back in September 2020.  She ultimately decided to discontinue it for at least a few months.  She had a lot of discomfort and it hurt her taking it out.  She feels the device is too hard in texture.  She does have significant uterine prolapse as she describes it and it is bothering her because she has to reduce the prolapse when cleansing and can feel it when she is up and moving about.  Past medical history,surgical history, problem list, medications, allergies, family history and social history were all reviewed and documented as reviewed in the EPIC chart.  ROS:  Feeling well. No dyspnea or chest pain on exertion.  No abdominal pain, change in bowel habits, black or bloody stools.  No urinary tract symptoms. GYN ROS:  no abnormal bleeding, pelvic pain or discharge, +pelvic organ prolapse   Physical Exam  BP 124/82  General: Pleasant female, no acute distress, alert and oriented PELVIC EXAM: normal appearing vulva with no masses, tenderness or lesions, grade 3-4 cervical/uterine prolapse, grade 1 cystocele, no appreciable rectocele.  [name] present during the examination  Assessment 79 y.o. [phone] who presents for management recommendations regarding significant and symptomatic pelvic organ prolapse  Plan We discussed that anatomically she would be a great candidate for a vaginal hysterectomy and prolapse repair, however, her medical history is concerning and she is not a good medical candidate for surgery, particularly with a history of CAD, pulmonary hypertension, and previous pulmonary embolism on chronic anticoagulation.  We discussed that her risk of suffering severe complications from thrombolic and embolic diseases would be quite high, and she would  also be at significant risk of a cardiac event, any of which could potentially result in severe debilitation if not fatal.  The patient is in agreement with this logic and is not pushing for surgery at this time after our discussion.    As the prolapse is symptomatic, I recommended returning another day for a fitting of a smaller pessary as her symptoms may be due to the pessary not being quite the right shape or size.  This would be the safer option for her medically and hopefully we can find a pessary that suits her well.  She will like to talk over things with her daughter and will let us know further if she has any questions or concerns in the meantime, but otherwise we will see her back for a pessary fitting.  All questions were answered by the end of the visit.   Larey Days MD 04/12/19

## 2019-04-12 NOTE — Progress Notes (Signed)
Cardiology Office Note    Date:  04/14/2019   ID:  Catherine Snyder, DOB 1940/09/26, MRN [id number]  PCP:  Sandrea Hughs, NP  Cardiologist:  Shelva Majestic, MD    History of Present Illness:  Catherine Snyder is a 79 y.o. female who presents for a 70-monthfollow-up cardiology evaluation.    Ms. FLiteshas a history of bladder cancer and is status post surgery and radiation in April 2017. [facility] with increasing dyspnea on exertion over several weeks duration.  She had mild elevation in troponin.  Cardiac catheterization demonstrated low normal to mild LV dysfunction with an EF of 50%.  There was LVH and subtle mild mid anterolateral hypocontractility.  There was mild nonobstructive CAD with 40% LAD stenosis with calcification.  Her circumflex and RCA were normal.  Right heart pressures were significantly elevated with a PA systolic pressure at 55.  An echo Doppler study suggested RV strain and pulmonary hypertension.  His VQ scan was positive for PE.  On 02/07/2016 she underwent successful pulmonary catheter directed thrombo-lysis by interventional radiology.  She required transfusion for post intervention of bleeding around the sheath but ultimately stabilized. .  She has been on Xarelto for anticoagulation.   She denies any episodes of chest pressure.  I initially saw her for evaluation in March 2018.  She underwent a follow-up echo Doppler study on March 29,2018  which showed normal systolic function with an EF of 55-60% with grade 1 diastolic dysfunction.  There was aortic sclerosis with mild AR, mitral regurgitation that was mild, biatrial enlargement and reportedly although not well described the RV size was normal with function was normal.  There was no mention of pulmonary pressures.  I saw her in follow-up on December 29, 2016 at which time she  felt improved.  However, she continued to experience exertional dyspnea.  She denied chest pressure.    Her aspirin was stopped by me at her initial office visit and she continued to be on Xarelto.  She also is felt to have an iron deficiency anemia and has been treated by Dr [name]  She continues to be on atorvastatin 80 mg.  Subsequent blood work was improved with the total cholesterol 104, LDL cholesterol 40.  During that evaluation I suspected her exertional dyspnea was contributed by her diastolic dysfunction.  I have not seen her since October 2018.  She was seen in July 2019 by [name] NP and at that time remained stable on Xarelto.  Over the last several years, she has continued to do well.  She denies any episodes of chest pain.  [facility]  She continues to be on amlodipine 10 mg for blood pressure control.  She is on atorvastatin 80 mg for hyperlipidemia.  She continues to be on Xarelto 20 mg with her history of saddle pulmonary embolus.  She is on requisite for restless legs.  She presents for cardiology evaluation.  Past Medical History:  Diagnosis Date  . [place] [facility] [place]    a. s/p resection in 06/2015  . Dysuria   . Frequency of urination   . Hematuria   . Hypertension   . Nocturia   . Pulmonary embolism ([facility]   . Wears glasses   . Wears partial dentures    upper    Past Surgical History:  Procedure Laterality Date  .  CARDIAC CATHETERIZATION N/A 02/04/2016   Procedure: RIGHT/LEFT HEART CATH AND CORONARY ANGIOGRAPHY;  Surgeon: Troy Sine, MD;  Location: [facility];  Service: Cardiovascular;  Laterality: N/A;  . CYSTOSCOPY W/ RETROGRADES Bilateral 07/07/2015   Procedure: CYSTOSCOPY WITH RETROGRADE PYELOGRAM;  Surgeon: Cleon Gustin, MD;  Location[facility];  Service: Urology;  Laterality: Bilateral;  . IR GENERIC HISTORICAL  02/07/2016   IR ANGIOGRAM SELECTIVE EACH ADDITIONAL VESSEL 02/07/2016 Jacqulynn Cadet, MD [facility]  . IR GENERIC HISTORICAL   02/07/2016   IR US GUIDE VASC ACCESS RIGHT 02/07/2016 Jacqulynn Cadet, MD [facility]  . IR GENERIC HISTORICAL  02/07/2016   IR ANGIOGRAM PULMONARY BILATERAL SELECTIVE 02/07/2016 Jacqulynn Cadet, MD [facility]  . IR GENERIC HISTORICAL  02/07/2016   IR INFUSION THROMBOL ARTERIAL INITIAL (MS) 02/07/2016 Jacqulynn Cadet, MD [facility]  . IR GENERIC HISTORICAL  02/07/2016   IR INFUSION THROMBOL ARTERIAL INITIAL (MS) 02/07/2016 Jacqulynn Cadet, MD [facility]  . IR GENERIC HISTORICAL  02/07/2016   IR ANGIOGRAM SELECTIVE EACH ADDITIONAL VESSEL 02/07/2016 Jacqulynn Cadet, MD [facility]  . NO PAST SURGERIES    . TRANSURETHRAL RESECTION OF BLADDER TUMOR WITH GYRUS (TURBT-GYRUS) N/A 07/07/2015   Procedure: TRANSURETHRAL RESECTION OF BLADDER TUMOR WITH GYRUS (TURBT-GYRUS);  Surgeon: Cleon Gustin, MD;  Location: [facility];  Service: Urology;  Laterality: N/A;    Current Medications: Outpatient Medications Prior to Visit  Medication Sig Dispense Refill  . amLODipine (NORVASC) 10 MG tablet TAKE 1 TABLET BY MOUTH EVERY DAY 90 tablet 1  . Ascorbic Acid (VITAMIN C) 1000 MG tablet Take 1,000 mg by mouth daily.    Marland Kitchen atorvastatin (LIPITOR) 80 MG tablet TAKE ONE TABLET BY MOUTH ONCE DAILY AT 6PM 90 tablet 1  . Cholecalciferol (VITAMIN D) 2000 units tablet Take 2,000 Units by mouth daily.    . diclofenac Sodium (VOLTAREN) 1 % GEL Apply 2 g topically as needed.    . gabapentin (NEURONTIN) 100 MG capsule Take 1-2 capsules (100-200 mg total) by mouth 3 (three) times daily. 60 capsule 3  . nitroGLYCERIN (NITROSTAT) 0.4 MG SL tablet Place 1 tablet (0.4 mg total) under the tongue every 5 (five) minutes x 3 doses as needed for chest pain. 30 tablet 0  . rOPINIRole (REQUIP) 0.5 MG tablet TAKE 1 TABLET (0.5 MG TOTAL) BY MOUTH AT BEDTIME. 90 tablet 1  . XARELTO 20 MG TABS tablet TAKE ONE TABLET BY MOUTH ONCE DAILY WITH SUPPER 30 tablet 5   No facility-administered medications  prior to visit.     Allergies:   Patient has no known allergies.   Social History   Socioeconomic History  . Marital status: Single    Spouse name: Not on file  . Number of children: Not on file  . Years of education: Not on file  . Highest education level: Not on file  Occupational History  . Occupation: Retired Automotive engineer  Tobacco Use  . Smoking status: Never Smoker  . Smokeless tobacco: Never Used  Substance and Sexual Activity  . Alcohol use: No  . Drug use: No  . Sexual activity: Not Currently    Comment: 1st intercourse- 20's, partners- 3, divorced  Other Topics Concern  . Not on file  Social History Narrative    Diet:   Do you drink/eat things with caffeine? Yes coffee   Marital status: Divorced  What year were you married?    Do you live in a house, apartment, assisted living, condo, trailer, etc)? House   Is it one or more stories? 1 story   How many persons live in your home? 4   Do you have any pets in your home? 0   Current or past profession: Dietitian   Do you exercise? Yes                                       Type & how often:  Walk daily   Do you have a living will? No   Do you have a DNR Form? No   Do you have a POA/HPOA forms? No   Social Determinants of Health   Financial Resource Strain:   . Difficulty of Paying Living Expenses: Not on file  Food Insecurity:   . Worried About Charity fundraiser in the Last Year: Not on file  . Ran Out of Food in the Last Year: Not on file  Transportation Needs:   . Lack of Transportation (Medical): Not on file  . Lack of Transportation (Non-Medical): Not on file  Physical Activity:   . Days of Exercise per Week: Not on file  . Minutes of Exercise per Session: Not on file  Stress:   . Feeling of Stress : Not on file  Social Connections:   . Frequency of Communication with Friends and Family: Not on file  . Frequency of Social Gatherings with Friends and Family: Not on file  .  Attends Religious Services: Not on file  . Active Member of Clubs or Organizations: Not on file  . Attends Archivist Meetings: Not on file  . Marital Status: Not on file     Family History:  The patient's family history includes CAD (age of onset: 52) in her brother; CAD (age of onset: 44) in her mother; Diabetes in her mother; Heart disease (age of onset: 66) in her father.   ROS General: Negative; No fevers, chills, or night sweats;  HEENT: Negative; No changes in vision or hearing, sinus congestion, difficulty swallowing Pulmonary: Negative; No cough, wheezing, shortness of breath, hemoptysis Cardiovascular: See HPI GI: Negative; No nausea, vomiting, diarrhea, or abdominal pain GU: Negative; No dysuria, hematuria, or difficulty voiding Musculoskeletal: Negative; no myalgias, joint pain, or weakness Hematologic/Oncology: Positive for microcytic anemia Endocrine: Negative; no heat/cold intolerance; no diabetes Neuro: Negative; no changes in balance, headaches Skin: Negative; No rashes or skin lesions Psychiatric: Negative; No behavioral problems, depression Sleep: Negative; No snoring, daytime sleepiness, hypersomnolence, bruxism, restless legs, hypnogognic hallucinations, no cataplexy Other comprehensive 14 point system review is negative.   PHYSICAL EXAM:   VS:  BP 136/75   Pulse (!) 58   Temp (!) 97.2 F (36.2 C)   Ht '5\' 3"'  (1.6 m)   Wt 173 lb 6.4 oz (78.7 kg)   SpO2 99%   BMI 30.72 kg/m     Repeat blood pressure by me was 132/76  Wt Readings from Last 3 Encounters:  04/12/19 173 lb 6.4 oz (78.7 kg)  03/19/19 174 lb 9.6 oz (79.2 kg)  03/05/19 171 lb 6.4 oz (77.7 kg)  General: Alert, oriented, no distress.  Skin: normal turgor, no rashes, warm and dry HEENT: Normocephalic, atraumatic. Pupils equal round and reactive to light; sclera anicteric; extraocular muscles intact;  Nose without nasal septal hypertrophy Mouth/Parynx benign; Mallinpatti  scale 3 Neck:  No JVD, no carotid bruits; normal carotid upstroke Lungs: clear to ausculatation and percussion; no wheezing or rales Chest wall: without tenderness to palpitation Heart: PMI not displaced, RRR, s1 s2 normal, 1/6 systolic murmur, no diastolic murmur, no rubs, gallops, thrills, or heaves Abdomen: soft, nontender; no hepatosplenomehaly, BS+; abdominal aorta nontender and not dilated by palpation. Back: no CVA tenderness Pulses 2+ Musculoskeletal: full range of motion, normal strength, no joint deformities Extremities: no clubbing cyanosis or edema, Homan's sign negative  Neurologic: grossly nonfocal; Cranial nerves grossly wnl Psychologic: Normal mood and affect   Studies/Labs Reviewed:   ECG (independently read by me): Sinus Bradycardia at 58; Normal intervals, no ectopy  October 2018 EKG:  Normal sinus rhythm at 69 bpm with PAC.  QS complex V1 V2.  Normal intervals.  March 2018 ECG (independently read by me): Normal sinus rhythm at 72 bpm.  Isolated PVC.  QTc interval 43 ms.  Recent Labs: BMP Latest Ref Rng & Units 07/12/2018 08/31/2017 04/12/2017  Glucose 65 - 99 mg/dL 122(H) 108(H) 102  BUN 7 - 25 mg/dL '8 9 9  ' Creatinine 0.60 - 0.93 mg/dL 0.69 0.73 0.63  BUN/Creat Ratio 6 - 22 (calc) NOT APPLICABLE NOT APPLICABLE NOT APPLICABLE  Sodium 976 - 146 mmol/L 141 142 139  Potassium 3.5 - 5.3 mmol/L 4.0 3.7 4.0  Chloride 98 - 110 mmol/L 106 106 106  CO2 20 - 32 mmol/L '26 28 27  ' Calcium 8.6 - 10.4 mg/dL 9.4 9.3 9.1     Hepatic Function Latest Ref Rng & Units 07/12/2018 08/31/2017 04/12/2017  Total Protein 6.1 - 8.1 g/dL 7.7 7.4 7.2  Albumin 3.6 - 5.1 g/dL - - -  AST 10 - 35 U/L '28 25 19  ' ALT 6 - 29 U/L '21 22 18  ' Alk Phosphatase 33 - 130 U/L - - -  Total Bilirubin 0.2 - 1.2 mg/dL 0.5 0.5 0.6    CBC Latest Ref Rng & Units 03/19/2019 03/05/2019 07/12/2018  WBC 3.8 - 10.8 Thousand/uL 3.8 4.4 4.0  Hemoglobin 11.7 - 15.5 g/dL 12.1 12.7 12.3  Hematocrit 35.0 - 45.0 % 36.4 38.7 37.4    Platelets 140 - 400 Thousand/uL 259 271 276   Lab Results  Component Value Date   MCV 89.2 03/19/2019   MCV 90.6 03/05/2019   MCV 88.4 07/12/2018   Lab Results  Component Value Date   TSH 1.71 08/31/2017   No results found for: HGBA1C   BNP    Component Value Date/Time   BNP 477.2 (H) 02/03/2016 1619    ProBNP No results found for: PROBNP   Lipid Panel     Component Value Date/Time   CHOL 97 07/12/2018 0830   CHOL 182 11/27/2014 1018   TRIG 71 07/12/2018 0830   HDL 36 (L) 07/12/2018 0830   HDL 43 11/27/2014 1018   CHOLHDL 2.7 07/12/2018 0830   VLDL 13 09/22/2016 1204   LDLCALC 46 07/12/2018 0830     RADIOLOGY: No results found.   Additional studies/ records that were reviewed today include:  Hospitalizations, echo Doppler data, laboratory.    ASSESSMENT:    1. Pulmonary embolism on long-term anticoagulation therapy ([place])   2. Pulmonary hypertension ([place])   3. Essential hypertension   4. Exertional dyspnea   5. Mixed hyperlipidemia   6. RLS (restless legs syndrome)     PLAN:  Ms. Gowell is a-79 year-old African-American female year-old who has a remote history of bladder CA and is status  post radiation and surgery.  She developed increasing shortness of breath and was found to have significant pulmonary hypertension and RV strain.  Catheterization did not reveal significant obstructive CAD but showed 40% LAD stenosis.  She underwent successful pulmonary catheter directed thrombo-lysis with resolution of saddle pulmonary embolus.  Her shortness of breath significantly improved however subsequently she still experience some mild exertional dyspnea.  Her follow-up echo Doppler study in March 2018 showed normal systolic function with grade 1 diastolic dysfunction, mild aortic sclerosis without stenosis, mild mitral regurgitation, biatrial enlargement, and on that echo the RV was not mentioned to be dilated.  With her extensive PE she has continued to be on  long-term anticoagulation and continues with Xarelto 20 mg daily.  Her blood pressure today is controlled on her current regimen of amlodipine 10 mg.  She continues to be on atorvastatin 80 mg for hyperlipidemia.  In April 2020 LDL cholesterol was excellent at 46.  She has restless leg syndrome and is on Requip in addition to gabapentin.  She is followed by [facility].  As long as she is stable I will see her in 1 year for reevaluation.   Medication Adjustments/Labs and Tests Ordered: Current medicines are reviewed at length with the patient today.  Concerns regarding medicines are outlined above.  Medication changes, Labs and Tests ordered today are listed in the Patient Instructions below.  Patient Instructions  Medication Instructions:  CONTINUE CURRENT MEDICATIONS *If you need a refill on your cardiac medications before your next appointment, please call your pharmacy*   Follow-Up: At [facility], you and your health needs are our priority.  As part of our continuing mission to provide you with exceptional heart care, we have created designated Provider Care Teams.  These Care Teams include your primary Cardiologist (physician) and Advanced Practice Providers (APPs -  Physician Assistants and Nurse Practitioners) who all work together to provide you with the care you need, when you need it.  Your next appointment:   12 month(s)  The format for your next appointment:   In Person  Provider:   Shelva Majestic, MD     Signed, Shelva Majestic, MD , [facility] 04/14/2019 2:45 PM    [facility] [address], [place], [place], [place]  [address] Phone: [phone]

## 2019-04-12 NOTE — Patient Instructions (Signed)
Medication Instructions:  CONTINUE CURRENT MEDICATIONS *If you need a refill on your cardiac medications before your next appointment, please call your pharmacy*   Follow-Up: At [facility], you and your health needs are our priority.  As part of our continuing mission to provide you with exceptional heart care, we have created designated Provider Care Teams.  These Care Teams include your primary Cardiologist (physician) and Advanced Practice Providers (APPs -  Physician Assistants and Nurse Practitioners) who all work together to provide you with the care you need, when you need it.  Your next appointment:   12 month(s)  The format for your next appointment:   In Person  Provider:   Shelva Majestic, MD

## 2019-04-14 ENCOUNTER — Encounter: Payer: Self-pay | Admitting: Cardiovascular Disease

## 2019-06-07 ENCOUNTER — Other Ambulatory Visit: Payer: Self-pay

## 2019-06-07 ENCOUNTER — Other Ambulatory Visit: Payer: Self-pay | Admitting: *Deleted

## 2019-06-07 ENCOUNTER — Ambulatory Visit (INDEPENDENT_AMBULATORY_CARE_PROVIDER_SITE_OTHER): Payer: PPO | Admitting: Family

## 2019-06-07 ENCOUNTER — Encounter: Payer: Self-pay | Admitting: Family

## 2019-06-07 VITALS — BP 138/70 | HR 60 | Temp 97.7°F | Ht 63.0 in | Wt 173.0 lb

## 2019-06-07 DIAGNOSIS — E782 Mixed hyperlipidemia: Secondary | ICD-10-CM

## 2019-06-07 DIAGNOSIS — I10 Essential (primary) hypertension: Secondary | ICD-10-CM | POA: Diagnosis not present

## 2019-06-07 DIAGNOSIS — R040 Epistaxis: Secondary | ICD-10-CM | POA: Diagnosis not present

## 2019-06-07 MED ORDER — OXYMETAZOLINE HCL 0.05 % NA SOLN: 1.0000 | Freq: Two times a day (BID) | NASAL | 0 refills | Status: DC

## 2019-06-07 NOTE — Progress Notes (Signed)
Provider: Marlowe Sax FNP-C  Megahn Killings, Nelda Bucks, NP  Patient Care Team: Emmersyn Kratzke, Nelda Bucks, NP as PCP - General (Family Medicine)  Extended Emergency Contact Information Primary Emergency Contact: Knerr,Jeanette Address: [address]           [place], [place] [address] [place] Phone: [phone] Mobile Phone: [phone] Relation: Daughter Secondary Emergency Contact: Chiquita Loth [place] Phone: [phone] Mobile Phone: [phone] Relation: Son  Code Status:  Full Code  Goals of care: Advanced Directive information Advanced Directives 07/14/2018  Does Patient Have a Medical Advance Directive? Yes  Type of Advance Directive [place]  Does patient want to make changes to medical advance directive? No - Patient declined  Copy of [place] in Chart? No - copy requested  Would patient like information on creating a medical advance directive? No - Patient declined     Chief Complaint  Patient presents with  . Acute Visit    Nose bleeds    HPI:  Pt is a 79 y.o. female seen today for an acute visit for evaluation of nose bleeds.she states nose started bleeding at 3 am until around 5 am.Bleeding has slowed down since then but has not completely resolved.she packed her left nares with tissue.she recall seeing a blood clot.states usual gets better when clot is passed.she denies any headache or dizziness. She has had nose bleed in the past.she has a humidify but forgot to use it last night.currently on Xarelto 20 mg tablet  for PE.   Past Medical History:  Diagnosis Date  . Bladder cancer ([place])    a. s/p resection in 06/2015  . Dysuria   . Frequency of urination   . Hematuria   . Hypertension   . Nocturia   . Pulmonary embolism ([place])   . Wears glasses   . Wears partial dentures    upper   Past Surgical History:  Procedure Laterality Date  . CARDIAC CATHETERIZATION N/A 02/04/2016     Procedure: RIGHT/LEFT HEART CATH AND CORONARY ANGIOGRAPHY;  Surgeon: Troy Sine, MD;  Location: [facility];  Service: Cardiovascular;  Laterality: N/A;  . CYSTOSCOPY W/ RETROGRADES Bilateral 07/07/2015   Procedure: CYSTOSCOPY WITH RETROGRADE PYELOGRAM;  Surgeon: Cleon Gustin, MD;  Location[facility];  Service: Urology;  Laterality: Bilateral;  . IR GENERIC HISTORICAL  02/07/2016   IR ANGIOGRAM SELECTIVE EACH ADDITIONAL VESSEL 02/07/2016 Jacqulynn Cadet, MD [facility]  . IR GENERIC HISTORICAL  02/07/2016   IR US GUIDE VASC ACCESS RIGHT 02/07/2016 Jacqulynn Cadet, MD [facility]  . IR GENERIC HISTORICAL  02/07/2016   IR ANGIOGRAM PULMONARY BILATERAL SELECTIVE 02/07/2016 Jacqulynn Cadet, MD [facility]  . IR GENERIC HISTORICAL  02/07/2016   IR INFUSION THROMBOL ARTERIAL INITIAL (MS) 02/07/2016 Jacqulynn Cadet, MD [facility]  . IR GENERIC HISTORICAL  02/07/2016   IR INFUSION THROMBOL ARTERIAL INITIAL (MS) 02/07/2016 Jacqulynn Cadet, MD [facility]  . IR GENERIC HISTORICAL  02/07/2016   IR ANGIOGRAM SELECTIVE EACH ADDITIONAL VESSEL 02/07/2016 Jacqulynn Cadet, MD [facility]  . NO PAST SURGERIES    . TRANSURETHRAL RESECTION OF BLADDER TUMOR WITH GYRUS (TURBT-GYRUS) N/A 07/07/2015   Procedure: TRANSURETHRAL RESECTION OF BLADDER TUMOR WITH GYRUS (TURBT-GYRUS);  Surgeon: Cleon Gustin, MD;  Location: [facility];  Service: Urology;  Laterality: N/A;    No Known Allergies  Outpatient Encounter Medications as of 06/07/2019  Medication Sig  . amLODipine (NORVASC) 10  MG tablet TAKE 1 TABLET BY MOUTH EVERY DAY  . Ascorbic Acid (VITAMIN C) 1000 MG tablet Take 1,000 mg by mouth daily.  Marland Kitchen atorvastatin (LIPITOR) 80 MG tablet TAKE ONE TABLET BY MOUTH ONCE DAILY AT 6PM  . Cholecalciferol (VITAMIN D) 2000 units tablet Take 2,000 Units by mouth daily.  . diclofenac Sodium (VOLTAREN) 1 % GEL Apply 2 g topically as needed.  . gabapentin  (NEURONTIN) 100 MG capsule Take 1-2 capsules (100-200 mg total) by mouth 3 (three) times daily.  . nitroGLYCERIN (NITROSTAT) 0.4 MG SL tablet Place 1 tablet (0.4 mg total) under the tongue every 5 (five) minutes x 3 doses as needed for chest pain.  Marland Kitchen rOPINIRole (REQUIP) 0.5 MG tablet TAKE 1 TABLET (0.5 MG TOTAL) BY MOUTH AT BEDTIME.  Marland Kitchen XARELTO 20 MG TABS tablet TAKE ONE TABLET BY MOUTH ONCE DAILY WITH SUPPER   No facility-administered encounter medications on file as of 06/07/2019.    Review of Systems  Constitutional: Negative for appetite change, chills, fatigue and fever.  HENT: Positive for nosebleeds. Negative for congestion, hearing loss, postnasal drip, rhinorrhea, sinus pressure, sinus pain, sneezing, sore throat, tinnitus and trouble swallowing.   Eyes: Negative for discharge, redness and itching.  Respiratory: Negative for cough, chest tightness, shortness of breath and wheezing.   Cardiovascular: Negative for chest pain, palpitations and leg swelling.  Musculoskeletal: Negative for arthralgias, back pain, gait problem and myalgias.  Skin: Negative for color change, pallor and rash.  Neurological: Negative for dizziness, speech difficulty, weakness, light-headedness and headaches.  Hematological: Does not bruise/bleed easily.  Psychiatric/Behavioral: Negative for agitation and sleep disturbance. The patient is not nervous/anxious.     Immunization History  Administered Date(s) Administered  . Influenza,inj,Quad PF,6+ Mos 11/27/2014  . Pneumococcal Conjugate-13 06/13/2017   Pertinent  Health Maintenance Due  Topic Date Due  . PNA vac Low Risk Adult (2 of 2 - PPSV23) 03/18/2020 (Originally 06/14/2018)  . INFLUENZA VACCINE  06/12/2020 (Originally 10/14/2018)  . MAMMOGRAM  12/14/2020  . DEXA SCAN  Completed   Fall Risk  03/19/2019 03/05/2019 11/13/2018 10/10/2018 09/14/2018  Falls in the past year? 0 0 0 0 0  Number falls in past yr: 0 0 0 0 0  Injury with Fall? 0 0 0 0 0    Vitals:    06/07/19 1106  BP: 138/70  Pulse: 60  Temp: 97.7 F (36.5 C)  TempSrc: Oral  SpO2: 96%  Weight: 173 lb (78.5 kg)  Height: 5\' 3"  (1.6 m)   Body mass index is 30.65 kg/m. Physical Exam Vitals reviewed.  Constitutional:      General: She is not in acute distress.    Appearance: She is obese. She is not ill-appearing.  HENT:     Head: Normocephalic.     Right Ear: Tympanic membrane, ear canal and external ear normal. There is no impacted cerumen.     Left Ear: Tympanic membrane, ear canal and external ear normal. There is no impacted cerumen.     Nose: No mucosal edema, congestion or rhinorrhea.     Right Nostril: No epistaxis.     Left Nostril: Epistaxis present.     Right Turbinates: Not enlarged or swollen.     Left Turbinates: Not enlarged or swollen.     Right Sinus: No maxillary sinus tenderness or frontal sinus tenderness.     Left Sinus: No maxillary sinus tenderness or frontal sinus tenderness.     Comments: Small amounts of bright red blood noted on  left nares preferred Afrin since bleeding has slowed down.  Neck:     Vascular: No carotid bruit.  Cardiovascular:     Rate and Rhythm: Normal rate and regular rhythm.     Pulses: Normal pulses.     Heart sounds: Normal heart sounds. No murmur. No friction rub. No gallop.   Pulmonary:     Effort: Pulmonary effort is normal. No respiratory distress.     Breath sounds: Normal breath sounds. No wheezing, rhonchi or rales.  Chest:     Chest wall: No tenderness.  Abdominal:     General: Bowel sounds are normal. There is no distension.     Palpations: Abdomen is soft. There is no mass.     Tenderness: There is no abdominal tenderness. There is no right CVA tenderness, left CVA tenderness, guarding or rebound.  Musculoskeletal:        General: No swelling or tenderness.     Cervical back: Normal range of motion. No rigidity or tenderness.     Right lower leg: No edema.     Left lower leg: No edema.  Lymphadenopathy:      Cervical: No cervical adenopathy.  Skin:    General: Skin is warm and dry.     Coloration: Skin is not pale.     Findings: No bruising or erythema.  Neurological:     Mental Status: She is alert and oriented to person, place, and time.     Cranial Nerves: No cranial nerve deficit.     Sensory: No sensory deficit.     Motor: No weakness.     Coordination: Coordination normal.     Gait: Gait normal.  Psychiatric:        Mood and Affect: Mood normal.        Behavior: Behavior normal.        Thought Content: Thought content normal.        Judgment: Judgment normal.    Labs reviewed: Recent Labs    07/12/18 0830  NA 141  K 4.0  CL 106  CO2 26  GLUCOSE 122*  BUN 8  CREATININE 0.69  CALCIUM 9.4   Recent Labs    07/12/18 0830  AST 28  ALT 21  BILITOT 0.5  PROT 7.7   Recent Labs    07/12/18 0830 03/05/19 1502 03/19/19 1010  WBC 4.0 4.4 3.8  NEUTROABS 1,832 2,072  --   HGB 12.3 12.7 12.1  HCT 37.4 38.7 36.4  MCV 88.4 90.6 89.2  PLT 276 271 259   Lab Results  Component Value Date   TSH 1.71 08/31/2017   No results found for: HGBA1C Lab Results  Component Value Date   CHOL 97 07/12/2018   HDL 36 (L) 07/12/2018   LDLCALC 46 07/12/2018   TRIG 71 07/12/2018   CHOLHDL 2.7 07/12/2018    Significant Diagnostic Results in last 30 days:  No results found.  Assessment/Plan   Bleeding from the nose Afebrile.had bleeding at 3 Am- 4 am which has slowed down.small amounts of bright red blood noted on left nares.offered silver nitrite cauterization but since bleeding has slowed down will use nasal spray.Instructed to spray Afrin one spray into left nares twice daily. - Uses Humidify daily at bedtime  - Avoid blowing or picking nose  - Additional education information on nose bleed provided.  - Has active lab work that have not been done.came fasting today will draw labs today.  - oxymetazoline (AFRIN) 0.05 % nasal spray; Place  1 spray into both nostrils 2 (two)  times daily.  Dispense: 30 mL; Refill: 0  Family/ staff Communication: Reviewed plan of care with patient verbalized understanding.  Labs/tests ordered:Has active lab orders to be done today.   Next Appointment: Has upcoming appointment 07/17/2019 for AWVs.  Sandrea Hughs, NP

## 2019-06-07 NOTE — Patient Instructions (Addendum)
Use Afrin nasal spray 2 sprays daily  - Go to ED if nose bleed worsen   Nosebleed, Adult A nosebleed is when blood comes out of the nose. Nosebleeds are common. Usually, they are not a sign of a serious condition. Nosebleeds can happen if a small blood vessel in your nose starts to bleed or if the lining of your nose (mucous membrane) cracks. They are commonly caused by:  Allergies.  Colds.  Picking your nose.  Blowing your nose too hard.  An injury from sticking an object into your nose or getting hit in the nose.  Dry or cold air. Less common causes of nosebleeds include:  Toxic fumes.  Something abnormal in the nose or in the air-filled spaces in the bones of the face (sinuses).  Growths in the nose, such as polyps.  Medicines or conditions that cause blood to clot slowly.  Certain illnesses or procedures that irritate or dry out the nasal passages. Follow these instructions at home: When you have a nosebleed:   Sit down and tilt your head slightly forward.  Use a clean towel or tissue to pinch your nostrils under the bony part of your nose. After 10 minutes, let go of your nose and see if bleeding starts again. Do not release pressure before that time. If there is still bleeding, repeat the pinching and holding for 10 minutes until the bleeding stops.  Do not place tissues or gauze in the nose to stop bleeding.  Avoid lying down and avoid tilting your head backward. That may make blood collect in the throat and cause gagging or coughing.  Use a nasal spray decongestant to help with a nosebleed as told by your health care provider.  Do not use petroleum jelly or mineral oil in your nose. It can drip into your lungs. After a nosebleed:  Avoid blowing your nose or sniffing for a number of hours.  Avoid straining, lifting, or bending at the waist for several days. You may resume other normal activities as you are able.  Use saline spray or a humidifier as told by  your health care provider.  Aspirinand blood thinners make bleeding more likely. If you are prescribed these medicines and you suffer from nosebleeds: ? Ask your health care provider if you should stop taking the medicines or if you should adjust the dose. ? Do not stop taking medicines that your health care provider has recommended unless told by your health care provider.  If your nosebleed was caused by dry mucous membranes, use over-the-counter saline nasal spray or gel. This will keep the mucous membranes moist and allow them to heal. If you must use a lubricant: ? Choose one that is water-soluble. ? Use only as much as you need and use it only as often as needed. ? Do not lie down until several hours after you use it. Contact a health care provider if:  You have a fever.  You get nosebleeds often or more often than usual.  You bruise very easily.  You have a nosebleed from having something stuck in your nose.  You have bleeding in your mouth.  You vomit or cough up brown material.  You have a nosebleed after you start a new medicine. Get help right away if:  You have a nosebleed after a fall or a head injury.  Your nosebleed does not go away after 20 minutes.  You feel dizzy or weak.  You have unusual bleeding from other parts of your  body.  You have unusual bruising on other parts of your body.  You become sweaty.  You vomit blood. This information is not intended to replace advice given to you by your health care provider. Make sure you discuss any questions you have with your health care provider. Document Revised: 05/31/2017 Document Reviewed: 09/16/2015 Elsevier Patient Education  [place].

## 2019-06-08 ENCOUNTER — Encounter: Payer: Self-pay | Admitting: *Deleted

## 2019-06-08 LAB — CBC WITH DIFFERENTIAL/PLATELET
Absolute Monocytes: 497 cells/uL (ref 200–950)
Basophils Absolute: 21 cells/uL (ref 0–200)
Basophils Relative: 0.6 %
Eosinophils Absolute: 81 cells/uL (ref 15–500)
Eosinophils Relative: 2.3 %
HCT: 38.6 % (ref 35.0–45.0)
Hemoglobin: 12.5 g/dL (ref 11.7–15.5)
Lymphs Abs: 1274 cells/uL (ref 850–3900)
MCH: 29.1 pg (ref 27.0–33.0)
MCHC: 32.4 g/dL (ref 32.0–36.0)
MCV: 89.8 fL (ref 80.0–100.0)
MPV: 10.3 fL (ref 7.5–12.5)
Monocytes Relative: 14.2 %
Neutro Abs: 1628 cells/uL (ref 1500–7800)
Neutrophils Relative %: 46.5 %
Platelets: 253 10*3/uL (ref 140–400)
RBC: 4.3 10*6/uL (ref 3.80–5.10)
RDW: 13.9 % (ref 11.0–15.0)
Total Lymphocyte: 36.4 %
WBC: 3.5 10*3/uL — ABNORMAL LOW (ref 3.8–10.8)

## 2019-06-08 LAB — COMPLETE METABOLIC PANEL WITH GFR
AG Ratio: 1.3 (calc) (ref 1.0–2.5)
ALT: 21 U/L (ref 6–29)
AST: 23 U/L (ref 10–35)
Albumin: 4 g/dL (ref 3.6–5.1)
Alkaline phosphatase (APISO): 99 U/L (ref 37–153)
BUN: 10 mg/dL (ref 7–25)
CO2: 26 mmol/L (ref 20–32)
Calcium: 9.4 mg/dL (ref 8.6–10.4)
Chloride: 106 mmol/L (ref 98–110)
Creat: 0.71 mg/dL (ref 0.60–0.93)
GFR, Est African American: 95 mL/min/{1.73_m2} (ref >=60)
GFR, Est Non African American: 82 mL/min/{1.73_m2} (ref >=60)
Globulin: 3.2 g/dL (calc) (ref 1.9–3.7)
Glucose, Bld: 138 mg/dL — ABNORMAL HIGH (ref 65–99)
Potassium: 4 mmol/L (ref 3.5–5.3)
Sodium: 141 mmol/L (ref 135–146)
Total Bilirubin: 0.5 mg/dL (ref 0.2–1.2)
Total Protein: 7.2 g/dL (ref 6.1–8.1)

## 2019-06-08 LAB — TSH: TSH: 1.96 mIU/L (ref 0.40–4.50)

## 2019-06-08 LAB — LIPID PANEL
Cholesterol: 113 mg/dL (ref <200)
HDL: 41 mg/dL — ABNORMAL LOW (ref >=50)
LDL Cholesterol (Calc): 53 mg/dL (calc)
Non-HDL Cholesterol (Calc): 72 mg/dL (calc) (ref <130)
Total CHOL/HDL Ratio: 2.8 (calc) (ref <5.0)
Triglycerides: 103 mg/dL (ref <150)

## 2019-07-09 ENCOUNTER — Ambulatory Visit: Payer: PPO | Admitting: Obstetrics and Gynecology

## 2019-07-09 ENCOUNTER — Encounter: Payer: Self-pay | Admitting: Obstetrics and Gynecology

## 2019-07-09 ENCOUNTER — Encounter: Payer: Self-pay | Admitting: Gynecology

## 2019-07-09 ENCOUNTER — Other Ambulatory Visit: Payer: Self-pay

## 2019-07-09 VITALS — BP 120/80

## 2019-07-09 DIAGNOSIS — Z4689 Encounter for fitting and adjustment of other specified devices: Secondary | ICD-10-CM

## 2019-07-09 DIAGNOSIS — N8189 Other female genital prolapse: Secondary | ICD-10-CM

## 2019-07-09 DIAGNOSIS — N813 Complete uterovaginal prolapse: Secondary | ICD-10-CM | POA: Diagnosis not present

## 2019-07-09 DIAGNOSIS — N814 Uterovaginal prolapse, unspecified: Secondary | ICD-10-CM

## 2019-07-09 NOTE — Progress Notes (Signed)
   HIBO FETTE [birth year] [id number]  SUBJECTIVE:  79 y.o. [id number] female presents for pessary fitting due to complete uterine procidentia.  She had a size 3 ring pessary with support was fitted back in September 2020, but she says it was causing a lot of discomfort, therefore she decided to remove it and wanted to try a smaller size.  She was seen in the office 04/12/2019 and had decided to take a 87-month break from the pessary.  She returns today for a repeat pessary fitting.  Current Outpatient Medications  Medication Sig Dispense Refill  . amLODipine (NORVASC) 10 MG tablet TAKE 1 TABLET BY MOUTH EVERY DAY 90 tablet 1  . Ascorbic Acid (VITAMIN C) 1000 MG tablet Take 1,000 mg by mouth daily.    Marland Kitchen atorvastatin (LIPITOR) 80 MG tablet TAKE ONE TABLET BY MOUTH ONCE DAILY AT 6PM 90 tablet 1  . Cholecalciferol (VITAMIN D) 2000 units tablet Take 2,000 Units by mouth daily.    . diclofenac Sodium (VOLTAREN) 1 % GEL Apply 2 g topically as needed.    . gabapentin (NEURONTIN) 100 MG capsule Take 1-2 capsules (100-200 mg total) by mouth 3 (three) times daily. 60 capsule 3  . nitroGLYCERIN (NITROSTAT) 0.4 MG SL tablet Place 1 tablet (0.4 mg total) under the tongue every 5 (five) minutes x 3 doses as needed for chest pain. 30 tablet 0  . oxymetazoline (AFRIN) 0.05 % nasal spray Place 1 spray into both nostrils 2 (two) times daily. 30 mL 0  . rOPINIRole (REQUIP) 0.5 MG tablet TAKE 1 TABLET (0.5 MG TOTAL) BY MOUTH AT BEDTIME. 90 tablet 1  . XARELTO 20 MG TABS tablet TAKE ONE TABLET BY MOUTH ONCE DAILY WITH SUPPER 30 tablet 5   No current facility-administered medications for this visit.   Allergies: Patient has no known allergies.  No LMP recorded. Patient is postmenopausal.  Past medical history,surgical history, problem list, medications, allergies, family history and social history were all reviewed and documented as reviewed in the EPIC chart.  OBJECTIVE:  BP 120/80  The patient appears well,  alert, oriented x 3, in no distress. PELVIC EXAM: VULVA: Complete uterine procidentia, cervix visible outside of the vagina at rest, grade 1 cystocele, no appreciable rectocele. Chaperone: [name] present during the examination  A size 3 ring pessary with support was placed.  The patient was allowed to get up and walk about the room and upon reexamination the pessary had remained in place.  ASSESSMENT:  79 y.o. [id number] here for pessary fitting  PLAN:  We will order a size 2 ring pessary with support and have her return for placement when the device is in stock.  I did caution her that the small size of the new pessary and the complete uterine procidentia may increase her risk of pessary expulsion.  I suggested trying a space filling type pessary such as a Gellhorn or donut today but she indicates she had discomfort with the larger ring pessary (which is smaller than the space filling type of pessaries), and she prefers to be able to remove the device on her own and would likely have trouble with those devices in that context.  She is not a good medical candidate for surgery and so we will try to get by with conservative management for as long as possible.  Joseph Pierini MD 07/09/19

## 2019-07-17 ENCOUNTER — Ambulatory Visit (INDEPENDENT_AMBULATORY_CARE_PROVIDER_SITE_OTHER): Payer: PPO | Admitting: Family

## 2019-07-17 ENCOUNTER — Encounter: Payer: Self-pay | Admitting: Family

## 2019-07-17 ENCOUNTER — Other Ambulatory Visit: Payer: Self-pay

## 2019-07-17 DIAGNOSIS — Z0000 Encounter for general adult medical examination without abnormal findings: Secondary | ICD-10-CM | POA: Diagnosis not present

## 2019-07-17 NOTE — Progress Notes (Signed)
Subjective:   Catherine Snyder is a 79 y.o. female who presents for Medicare Annual (Subsequent) preventive examination.  Review of Systems:  Cardiac Risk Factors include: advanced age (>2men, >69 women);hypertension;dyslipidemia;obesity (BMI >30kg/m2)     Objective:     Vitals: There were no vitals taken for this visit.  There is no height or weight on file to calculate BMI.  Advanced Directives 07/17/2019 07/14/2018 07/04/2018 07/20/2017 06/13/2017 06/13/2017 09/22/2016  Does Patient Have a Medical Advance Directive? No Yes No Yes Yes Yes Yes  Type of Advance Directive - [place];Living will [place];Living will [place];Living will [place];Living will  Does patient want to make changes to medical advance directive? - No - Patient declined - - No - Patient declined No - Patient declined No - Patient declined  Copy of Healthcare Power of Attorney in Chart? - No - copy requested - Yes Yes Yes Yes  Would patient like information on creating a medical advance directive? - No - Patient declined No - Patient declined - - - -    Tobacco Social History   Tobacco Use  Smoking Status Never Smoker  Smokeless Tobacco Never Used     Counseling given: Not Answered   Clinical Intake:  Pre-visit preparation completed: No  Pain : No/denies pain  BMI - recorded: 30.65 Nutritional Status: BMI > 30  Obese Nutritional Risks: None Diabetes: No  How often do you need to have someone help you when you read instructions, pamphlets, or other written materials from your doctor or pharmacy?: 3 - Sometimes What is the last grade level you completed in school?: 12 grade  Interpreter Needed?: No  Information entered by :: Shawndell Varas FNP-C  Past Medical History:  Diagnosis Date  . Bladder cancer ([place])    a. s/p resection in 06/2015  . Dysuria   . Frequency of urination   . Hematuria    . Hypertension   . Nocturia   . Pulmonary embolism ([place])   . Wears glasses   . Wears partial dentures    upper   Past Surgical History:  Procedure Laterality Date  . CARDIAC CATHETERIZATION N/A 02/04/2016   Procedure: RIGHT/LEFT HEART CATH AND CORONARY ANGIOGRAPHY;  Surgeon: Troy Sine, MD;  Location: [facility];  Service: Cardiovascular;  Laterality: N/A;  . CYSTOSCOPY W/ RETROGRADES Bilateral 07/07/2015   Procedure: CYSTOSCOPY WITH RETROGRADE PYELOGRAM;  Surgeon: Cleon Gustin, MD;  Location[facility];  Service: Urology;  Laterality: Bilateral;  . IR GENERIC HISTORICAL  02/07/2016   IR ANGIOGRAM SELECTIVE EACH ADDITIONAL VESSEL 02/07/2016 Jacqulynn Cadet, MD [facility]  . IR GENERIC HISTORICAL  02/07/2016   IR US GUIDE VASC ACCESS RIGHT 02/07/2016 Jacqulynn Cadet, MD [facility]  . IR GENERIC HISTORICAL  02/07/2016   IR ANGIOGRAM PULMONARY BILATERAL SELECTIVE 02/07/2016 Jacqulynn Cadet, MD [facility]  . IR GENERIC HISTORICAL  02/07/2016   IR INFUSION THROMBOL ARTERIAL INITIAL (MS) 02/07/2016 Jacqulynn Cadet, MD [facility]  . IR GENERIC HISTORICAL  02/07/2016   IR INFUSION THROMBOL ARTERIAL INITIAL (MS) 02/07/2016 Jacqulynn Cadet, MD [facility]  . IR GENERIC HISTORICAL  02/07/2016   IR ANGIOGRAM SELECTIVE EACH ADDITIONAL VESSEL 02/07/2016 Jacqulynn Cadet, MD [facility]  . NO PAST SURGERIES    . TRANSURETHRAL RESECTION OF BLADDER TUMOR WITH GYRUS (TURBT-GYRUS) N/A 07/07/2015   Procedure: TRANSURETHRAL RESECTION OF BLADDER TUMOR WITH GYRUS (TURBT-GYRUS);  Surgeon:  Cleon Gustin, MD;  Location: [facility];  Service: Urology;  Laterality: N/A;   Family History  Problem Relation Age of Onset  . CAD Mother 82  . Diabetes Mother   . Heart disease Father 46  . CAD Brother 25   Social History   Socioeconomic History  . Marital status: Single    Spouse name: Not on file  . Number of children: Not on file  .  Years of education: Not on file  . Highest education level: Not on file  Occupational History  . Occupation: Retired Automotive engineer  Tobacco Use  . Smoking status: Never Smoker  . Smokeless tobacco: Never Used  Substance and Sexual Activity  . Alcohol use: No  . Drug use: No  . Sexual activity: Not Currently    Comment: 1st intercourse- 20's, partners- 3, divorced  Other Topics Concern  . Not on file  Social History Narrative    Diet:   Do you drink/eat things with caffeine? Yes coffee   Marital status: Divorced                              What year were you married?    Do you live in a house, apartment, assisted living, condo, trailer, etc)? House   Is it one or more stories? 1 story   How many persons live in your home? 4   Do you have any pets in your home? 0   Current or past profession: Dietitian   Do you exercise? Yes                                       Type & how often:  Walk daily   Do you have a living will? No   Do you have a DNR Form? No   Do you have a POA/HPOA forms? No   Social Determinants of Health   Financial Resource Strain:   . Difficulty of Paying Living Expenses:   Food Insecurity:   . Worried About Charity fundraiser in the Last Year:   . Arboriculturist in the Last Year:   Transportation Needs:   . Film/video editor (Medical):   Marland Kitchen Lack of Transportation (Non-Medical):   Physical Activity:   . Days of Exercise per Week:   . Minutes of Exercise per Session:   Stress:   . Feeling of Stress :   Social Connections:   . Frequency of Communication with Friends and Family:   . Frequency of Social Gatherings with Friends and Family:   . Attends Religious Services:   . Active Member of Clubs or Organizations:   . Attends Archivist Meetings:   Marland Kitchen Marital Status:     Outpatient Encounter Medications as of 07/17/2019  Medication Sig  . amLODipine (NORVASC) 10 MG tablet TAKE 1 TABLET BY MOUTH EVERY DAY  . Ascorbic Acid (VITAMIN C) 1000 MG  tablet Take 1,000 mg by mouth daily.  Marland Kitchen atorvastatin (LIPITOR) 80 MG tablet TAKE ONE TABLET BY MOUTH ONCE DAILY AT 6PM  . Cholecalciferol (VITAMIN D) 2000 units tablet Take 2,000 Units by mouth daily.  . diclofenac Sodium (VOLTAREN) 1 % GEL Apply 2 g topically as needed.  . gabapentin (NEURONTIN) 100 MG capsule Take 1-2 capsules (100-200 mg total) by mouth 3 (three) times daily.  Marland Kitchen  nitroGLYCERIN (NITROSTAT) 0.4 MG SL tablet Place 1 tablet (0.4 mg total) under the tongue every 5 (five) minutes x 3 doses as needed for chest pain.  Marland Kitchen oxymetazoline (AFRIN) 0.05 % nasal spray Place 1 spray into both nostrils 2 (two) times daily.  Marland Kitchen rOPINIRole (REQUIP) 0.5 MG tablet TAKE 1 TABLET (0.5 MG TOTAL) BY MOUTH AT BEDTIME.  Marland Kitchen XARELTO 20 MG TABS tablet TAKE ONE TABLET BY MOUTH ONCE DAILY WITH SUPPER   No facility-administered encounter medications on file as of 07/17/2019.    Activities of Daily Living In your present state of health, do you have any difficulty performing the following activities: 07/17/2019  Hearing? N  Vision? N  Difficulty concentrating or making decisions? N  Walking or climbing stairs? N  Dressing or bathing? N  Doing errands, shopping? Y  Comment daughter assist with driving  Preparing Food and eating ? N  Using the Toilet? N  In the past six months, have you accidently leaked urine? N  Do you have problems with loss of bowel control? N  Managing your Medications? N  Managing your Finances? N  Comment daughter assist  Housekeeping or managing your Housekeeping? N  Some recent data might be hidden    Patient Care Team: Brittany Osier, Nelda Bucks, NP as PCP - General (Family Medicine)    Assessment:   This is a routine wellness examination for [place].  Exercise Activities and Dietary recommendations Current Exercise Habits: Home exercise routine, Type of exercise: stretching;walking, Time (Minutes): 20, Frequency (Times/Week): 7, Weekly Exercise (Minutes/Week): 140, Intensity:  Moderate, Exercise limited by: None identified  Goals    . <enter goal here>     Starting 05/07/16, I will maintain my current lifestyle.        Fall Risk Fall Risk  07/17/2019 03/19/2019 03/05/2019 11/13/2018 10/10/2018  Falls in the past year? 0 0 0 0 0  Number falls in past yr: 0 0 0 0 0  Injury with Fall? 0 0 0 0 0   Is the patient's home free of loose throw rugs in walkways, pet beds, electrical cords, etc?   no      Grab bars in the bathroom? no      Handrails on the stairs?   yes      Adequate lighting?   yes  Depression Screen PHQ 2/9 Scores 07/17/2019 09/07/2018 07/14/2018 07/04/2018  PHQ - 2 Score 0 0 0 0  PHQ- 9 Score - - - -     Cognitive Function MMSE - Mini Mental State Exam 06/13/2017 05/07/2016 05/07/2015  Not completed: - - (No Data)  Orientation to time 5 5 4   Orientation to Place 3 4 4   Registration 3 3 3   Attention/ Calculation 5 5 5   Recall 1 3 2   Language- name 2 objects 2 2 2   Language- repeat 1 1 0  Language- follow 3 step command 3 3 3   Language- read & follow direction 1 1 1   Write a sentence 1 0 1  Copy design 1 1 0  Total score 26 28 25      6CIT Screen 07/17/2019 07/14/2018  What Year? 0 points 0 points  What month? 0 points 0 points  What time? 0 points 0 points  Count back from 20 0 points 0 points  Months in reverse - 0 points  Repeat phrase 2 points 0 points  Total Score - 0    Immunization History  Administered Date(s) Administered  . Influenza,inj,Quad PF,6+ Mos 11/27/2014  .  Pneumococcal Conjugate-13 06/13/2017    Qualifies for Shingles Vaccine? Declined   Screening Tests Health Maintenance  Topic Date Due  . COVID-19 Vaccine (1) Never done  . TETANUS/TDAP  Never done  . PNA vac Low Risk Adult (2 of 2 - PPSV23) 03/18/2020 (Originally 06/14/2018)  . INFLUENZA VACCINE  06/12/2020 (Originally 10/14/2019)  . MAMMOGRAM  12/14/2020  . DEXA SCAN  Completed    Cancer Screenings: Lung: Low Dose CT Chest recommended if Age 29-80 years, 30  pack-year currently smoking OR have quit w/in 15years. Patient does not qualify. Breast:  Up to date on Mammogram? Yes   Up to date of Bone Density/Dexa? Yes Colorectal: Age out   Additional Screenings: Hepatitis C Screening: Low Risk      Plan:   - Declined shingrix vaccine  - Has not made a decision whether to get the COVID-19 vaccine  - Hep C screening with next lab work    I have personally reviewed and noted the following in the patient's chart:   . Medical and social history . Use of alcohol, tobacco or illicit drugs  . Current medications and supplements . Functional ability and status . Nutritional status . Physical activity . Advanced directives . List of other physicians . Hospitalizations, surgeries, and ER visits in previous 12 months . Vitals . Screenings to include cognitive, depression, and falls . Referrals and appointments  In addition, I have reviewed and discussed with patient certain preventive protocols, quality metrics, and best practice recommendations. A written personalized care plan for preventive services as well as general preventive health recommendations were provided to patient.    Sandrea Hughs, NP  07/17/2019

## 2019-07-17 NOTE — Patient Instructions (Signed)
Catherine Snyder , Thank you for taking time to come for your Medicare Wellness Visit. I appreciate your ongoing commitment to your health goals. Please review the following plan we discussed and let me know if I can assist you in the future.   Screening recommendations/referrals: Colonoscopy: Aged out  Mammogram: Up to date  Bone Density : Up to date  Recommended yearly ophthalmology/optometry visit for glaucoma screening and checkup Recommended yearly dental visit for hygiene and checkup  Vaccinations: Influenza vaccine : Up to date  Pneumococcal vaccine: Second dose due  Tdap vaccine: Declined  Shingles vaccine: Declined     Advanced directives: No   Conditions/risks identified: Advance Age female > 35 yrs,Hypertension,Dyslipidemia,Obesity   Next appointment: 1 year    Preventive Care 78 Years and Older, Female Preventive care refers to lifestyle choices and visits with your health care provider that can promote health and wellness. What does preventive care include?  A yearly physical exam. This is also called an annual well check.  Dental exams once or twice a year.  Routine eye exams. Ask your health care provider how often you should have your eyes checked.  Personal lifestyle choices, including:  Daily care of your teeth and gums.  Regular physical activity.  Eating a healthy diet.  Avoiding tobacco and drug use.  Limiting alcohol use.  Practicing safe sex.  Taking low-dose aspirin every day.  Taking vitamin and mineral supplements as recommended by your health care provider. What happens during an annual well check? The services and screenings done by your health care provider during your annual well check will depend on your age, overall health, lifestyle risk factors, and family history of disease. Counseling  Your health care provider may ask you questions about your:  Alcohol use.  Tobacco use.  Drug use.  Emotional well-being.  Home and  relationship well-being.  Sexual activity.  Eating habits.  History of falls.  Memory and ability to understand (cognition).  Work and work Statistician.  Reproductive health. Screening  You may have the following tests or measurements:  Height, weight, and BMI.  Blood pressure.  Lipid and cholesterol levels. These may be checked every 5 years, or more frequently if you are over 85 years old.  Skin check.  Lung cancer screening. You may have this screening every year starting at age 67 if you have a 30-pack-year history of smoking and currently smoke or have quit within the past 15 years.  Fecal occult blood test (FOBT) of the stool. You may have this test every year starting at age 70.  Flexible sigmoidoscopy or colonoscopy. You may have a sigmoidoscopy every 5 years or a colonoscopy every 10 years starting at age 74.  Hepatitis C blood test.  Hepatitis B blood test.  Sexually transmitted disease (STD) testing.  Diabetes screening. This is done by checking your blood sugar (glucose) after you have not eaten for a while (fasting). You may have this done every 1-3 years.  Bone density scan. This is done to screen for osteoporosis. You may have this done starting at age 38.  Mammogram. This may be done every 1-2 years. Talk to your health care provider about how often you should have regular mammograms. Talk with your health care provider about your test results, treatment options, and if necessary, the need for more tests. Vaccines  Your health care provider may recommend certain vaccines, such as:  Influenza vaccine. This is recommended every year.  Tetanus, diphtheria, and acellular pertussis (Tdap, Td)  vaccine. You may need a Td booster every 10 years.  Zoster vaccine. You may need this after age 50.  Pneumococcal 13-valent conjugate (PCV13) vaccine. One dose is recommended after age 1.  Pneumococcal polysaccharide (PPSV23) vaccine. One dose is recommended after  age 29. Talk to your health care provider about which screenings and vaccines you need and how often you need them. This information is not intended to replace advice given to you by your health care provider. Make sure you discuss any questions you have with your health care provider. Document Released: 03/28/2015 Document Revised: 11/19/2015 Document Reviewed: 12/31/2014 Elsevier Interactive Patient Education  2017 [place] Prevention in the Home Falls can cause injuries. They can happen to people of all ages. There are many things you can do to make your home safe and to help prevent falls. What can I do on the outside of my home?  Regularly fix the edges of walkways and driveways and fix any cracks.  Remove anything that might make you trip as you walk through a door, such as a raised step or threshold.  Trim any bushes or trees on the path to your home.  Use bright outdoor lighting.  Clear any walking paths of anything that might make someone trip, such as rocks or tools.  Regularly check to see if handrails are loose or broken. Make sure that both sides of any steps have handrails.  Any raised decks and porches should have guardrails on the edges.  Have any leaves, snow, or ice cleared regularly.  Use sand or salt on walking paths during winter.  Clean up any spills in your garage right away. This includes oil or grease spills. What can I do in the bathroom?  Use night lights.  Install grab bars by the toilet and in the tub and shower. Do not use towel bars as grab bars.  Use non-skid mats or decals in the tub or shower.  If you need to sit down in the shower, use a plastic, non-slip stool.  Keep the floor dry. Clean up any water that spills on the floor as soon as it happens.  Remove soap buildup in the tub or shower regularly.  Attach bath mats securely with double-sided non-slip rug tape.  Do not have throw rugs and other things on the floor that can  make you trip. What can I do in the bedroom?  Use night lights.  Make sure that you have a light by your bed that is easy to reach.  Do not use any sheets or blankets that are too big for your bed. They should not hang down onto the floor.  Have a firm chair that has side arms. You can use this for support while you get dressed.  Do not have throw rugs and other things on the floor that can make you trip. What can I do in the kitchen?  Clean up any spills right away.  Avoid walking on wet floors.  Keep items that you use a lot in easy-to-reach places.  If you need to reach something above you, use a strong step stool that has a grab bar.  Keep electrical cords out of the way.  Do not use floor polish or wax that makes floors slippery. If you must use wax, use non-skid floor wax.  Do not have throw rugs and other things on the floor that can make you trip. What can I do with my stairs?  Do not leave  any items on the stairs.  Make sure that there are handrails on both sides of the stairs and use them. Fix handrails that are broken or loose. Make sure that handrails are as long as the stairways.  Check any carpeting to make sure that it is firmly attached to the stairs. Fix any carpet that is loose or worn.  Avoid having throw rugs at the top or bottom of the stairs. If you do have throw rugs, attach them to the floor with carpet tape.  Make sure that you have a light switch at the top of the stairs and the bottom of the stairs. If you do not have them, ask someone to add them for you. What else can I do to help prevent falls?  Wear shoes that:  Do not have high heels.  Have rubber bottoms.  Are comfortable and fit you well.  Are closed at the toe. Do not wear sandals.  If you use a stepladder:  Make sure that it is fully opened. Do not climb a closed stepladder.  Make sure that both sides of the stepladder are locked into place.  Ask someone to hold it for you,  if possible.  Clearly mark and make sure that you can see:  Any grab bars or handrails.  First and last steps.  Where the edge of each step is.  Use tools that help you move around (mobility aids) if they are needed. These include:  Canes.  Walkers.  Scooters.  Crutches.  Turn on the lights when you go into a dark area. Replace any light bulbs as soon as they burn out.  Set up your furniture so you have a clear path. Avoid moving your furniture around.  If any of your floors are uneven, fix them.  If there are any pets around you, be aware of where they are.  Review your medicines with your doctor. Some medicines can make you feel dizzy. This can increase your chance of falling. Ask your doctor what other things that you can do to help prevent falls. This information is not intended to replace advice given to you by your health care provider. Make sure you discuss any questions you have with your health care provider. Document Released: 12/26/2008 Document Revised: 08/07/2015 Document Reviewed: 04/05/2014 Elsevier Interactive Patient Education  2017 [employer].

## 2019-07-17 NOTE — Progress Notes (Signed)
    This service is provided via telemedicine  No vital signs collected/recorded due to the encounter was a telemedicine visit.   Location of patient (ex: home, work): Home.  Patient consents to a telephone visit: Yes.  Location of the provider (ex: office, home):  [facility].  Name of any referring provider: N/A  Names of all persons participating in the telemedicine service and their role in the encounter:  Patient, Candies Palm, RMA, Ngetich, Dinah, NP.    Time spent on call: 8 minutes spent on the phone with Medical Assistant.   

## 2019-07-22 ENCOUNTER — Other Ambulatory Visit: Payer: Self-pay | Admitting: Family

## 2019-07-22 DIAGNOSIS — I10 Essential (primary) hypertension: Secondary | ICD-10-CM

## 2019-08-24 ENCOUNTER — Other Ambulatory Visit: Payer: Self-pay | Admitting: Family

## 2019-09-18 ENCOUNTER — Ambulatory Visit: Payer: PPO | Admitting: Adult Health

## 2019-09-21 ENCOUNTER — Ambulatory Visit: Payer: PPO | Admitting: Family

## 2019-09-26 ENCOUNTER — Other Ambulatory Visit: Payer: Self-pay | Admitting: Family

## 2019-09-26 DIAGNOSIS — G2581 Restless legs syndrome: Secondary | ICD-10-CM

## 2019-12-17 ENCOUNTER — Other Ambulatory Visit: Payer: Self-pay | Admitting: Family

## 2019-12-17 DIAGNOSIS — Z1231 Encounter for screening mammogram for malignant neoplasm of breast: Secondary | ICD-10-CM

## 2019-12-27 ENCOUNTER — Encounter: Payer: Self-pay | Admitting: Nurse Practitioner

## 2019-12-27 ENCOUNTER — Ambulatory Visit (INDEPENDENT_AMBULATORY_CARE_PROVIDER_SITE_OTHER): Payer: PPO | Admitting: Nurse Practitioner

## 2019-12-27 ENCOUNTER — Other Ambulatory Visit: Payer: Self-pay

## 2019-12-27 VITALS — BP 110/78 | HR 72 | Temp 97.1°F

## 2019-12-27 DIAGNOSIS — R509 Fever, unspecified: Secondary | ICD-10-CM

## 2019-12-27 LAB — POCT INFLUENZA A/B
Influenza A, POC: NEGATIVE
Influenza B, POC: NEGATIVE

## 2019-12-27 NOTE — Progress Notes (Signed)
Careteam: Patient Care Team: Catherine Snyder, Catherine Bucks, NP as PCP - General (Family Medicine)  PLACE OF SERVICE:  [place] Directive information    No Known Allergies  Chief Complaint  Patient presents with  . Acute Visit    Patient c/o fever, vomiting, and body aches. Symptoms onset Monday 12/24/2019. Patient is not vaccinated against covid 19 and per her daughter will probably opt out of vaccinating.      HPI: Patient is a 79 y.o. female seen today who has been having fever, chills body aches since 12/24/19. She had vomiting episodes due to her daughter giving her medication that did not agree with her. Only 1 time, no nausea noted.  Her daughter has tested positive for COVID and her daughters children are staying with her. They are currently without symptoms.  Her daughter and granddaughter have had COVID.   Reports overall she feels fine at this time. Tuesday and Wednesday she was in the bed and more fatigued. Now feels great. Decrease in appetite.   Reports some loose BM, "not that often"  States she has occasional cough and congestion. No shortness of breath. No chest pains.   No headaches, no loss of taste or smell.   Review of Systems:  Review of Systems  Constitutional: Positive for chills and fever. Negative for weight loss.  HENT: Negative for hearing loss and tinnitus.   Respiratory: Negative for cough, sputum production and shortness of breath.        Chest congestion  Cardiovascular: Negative for chest pain, palpitations and leg swelling.  Gastrointestinal: Positive for diarrhea. Negative for abdominal pain, constipation and heartburn.  Genitourinary: Negative for dysuria, frequency and urgency.  Skin: Negative.   Neurological: Negative for dizziness and headaches.    Past Medical History:  Diagnosis Date  . Bladder cancer ([place])    a. s/p resection in 06/2015  . Dysuria   . Frequency of urination   . Hematuria   . Hypertension   . Nocturia     . Pulmonary embolism ([place])   . Wears glasses   . Wears partial dentures    upper   Past Surgical History:  Procedure Laterality Date  . CARDIAC CATHETERIZATION N/A 02/04/2016   Procedure: RIGHT/LEFT HEART CATH AND CORONARY ANGIOGRAPHY;  Surgeon: Troy Sine, MD;  Location: [facility];  Service: Cardiovascular;  Laterality: N/A;  . CYSTOSCOPY W/ RETROGRADES Bilateral 07/07/2015   Procedure: CYSTOSCOPY WITH RETROGRADE PYELOGRAM;  Surgeon: Cleon Gustin, MD;  Location: [facility];  Service: Urology;  Laterality: Bilateral;  . IR GENERIC HISTORICAL  02/07/2016   IR ANGIOGRAM SELECTIVE EACH ADDITIONAL VESSEL 02/07/2016 Jacqulynn Cadet, MD [facility]  . IR GENERIC HISTORICAL  02/07/2016   IR US GUIDE VASC ACCESS RIGHT 02/07/2016 Jacqulynn Cadet, MD [facility]  . IR GENERIC HISTORICAL  02/07/2016   IR ANGIOGRAM PULMONARY BILATERAL SELECTIVE 02/07/2016 Jacqulynn Cadet, MD [facility]  . IR GENERIC HISTORICAL  02/07/2016   IR INFUSION THROMBOL ARTERIAL INITIAL (MS) 02/07/2016 Jacqulynn Cadet, MD [facility]  . IR GENERIC HISTORICAL  02/07/2016   IR INFUSION THROMBOL ARTERIAL INITIAL (MS) 02/07/2016 Jacqulynn Cadet, MD [facility]  . IR GENERIC HISTORICAL  02/07/2016   IR ANGIOGRAM SELECTIVE EACH ADDITIONAL VESSEL 02/07/2016 Jacqulynn Cadet, MD [facility]  . NO PAST SURGERIES    . TRANSURETHRAL RESECTION OF BLADDER TUMOR WITH GYRUS (TURBT-GYRUS) N/A 07/07/2015   Procedure: TRANSURETHRAL RESECTION OF BLADDER TUMOR WITH GYRUS (TURBT-GYRUS);  Surgeon: Saralyn Pilar  Alita Chyle, MD;  Location: [facility];  Service: Urology;  Laterality: N/A;   Social History:   reports that she has never smoked. She has never used smokeless tobacco. She reports that she does not drink alcohol and does not use drugs.  Family History  Problem Relation Age of Onset  . CAD Mother 48  . Diabetes Mother   . Heart disease Father 42  . CAD Brother 40     Medications: Patient's Medications  New Prescriptions   No medications on file  Previous Medications   AMLODIPINE (NORVASC) 10 MG TABLET    TAKE 1 TABLET BY MOUTH EVERY DAY   ASCORBIC ACID (VITAMIN C) 1000 MG TABLET    Take 1,000 mg by mouth daily.   ATORVASTATIN (LIPITOR) 80 MG TABLET    TAKE ONE TABLET BY MOUTH ONCE DAILY AT 6PM   CHOLECALCIFEROL (VITAMIN D) 2000 UNITS TABLET    Take 2,000 Units by mouth daily.   DICLOFENAC SODIUM (VOLTAREN) 1 % GEL    Apply 2 g topically as needed.   GABAPENTIN (NEURONTIN) 100 MG CAPSULE    Take 1-2 capsules (100-200 mg total) by mouth 3 (three) times daily.   NITROGLYCERIN (NITROSTAT) 0.4 MG SL TABLET    Place 1 tablet (0.4 mg total) under the tongue every 5 (five) minutes x 3 doses as needed for chest pain.   OXYMETAZOLINE (AFRIN) 0.05 % NASAL SPRAY    Place 1 spray into both nostrils 2 (two) times daily.   ROPINIROLE (REQUIP) 0.5 MG TABLET    TAKE 1 TABLET (0.5 MG TOTAL) BY MOUTH AT BEDTIME.   XARELTO 20 MG TABS TABLET    TAKE ONE TABLET BY MOUTH ONCE DAILY WITH SUPPER  Modified Medications   No medications on file  Discontinued Medications   No medications on file    Physical Exam:  Vitals:   12/27/19 1309  BP: 110/78  Pulse: 72  Temp: (!) 97.1 F (36.2 C)  TempSrc: Temporal  SpO2: 99%   There is no height or weight on file to calculate BMI. Wt Readings from Last 3 Encounters:  06/07/19 173 lb (78.5 kg)  04/12/19 173 lb 6.4 oz (78.7 kg)  03/19/19 174 lb 9.6 oz (79.2 kg)    Physical Exam Constitutional:      General: She is not in acute distress.    Appearance: She is well-developed. She is not diaphoretic.  HENT:     Head: Normocephalic and atraumatic.  Eyes:     Conjunctiva/sclera: Conjunctivae normal.     Pupils: Pupils are equal, round, and reactive to light.  Cardiovascular:     Rate and Rhythm: Normal rate and regular rhythm.     Heart sounds: Normal heart sounds.  Pulmonary:     Effort: Pulmonary effort is  normal.     Breath sounds: Normal breath sounds.  Abdominal:     General: Bowel sounds are normal.     Palpations: Abdomen is soft.  Musculoskeletal:        General: No tenderness.     Cervical back: Normal range of motion and neck supple.  Skin:    General: Skin is warm and dry.  Neurological:     Mental Status: She is alert and oriented to person, place, and time. Mental status is at baseline.  Psychiatric:        Mood and Affect: Mood normal.     Labs reviewed: Basic Metabolic Panel: Recent Labs    06/07/19 1205  NA 141  K 4.0  CL 106  CO2 26  GLUCOSE 138*  BUN 10  CREATININE 0.71  CALCIUM 9.4  TSH 1.96   Liver Function Tests: Recent Labs    06/07/19 1205  AST 23  ALT 21  BILITOT 0.5  PROT 7.2   No results for input(s): LIPASE, AMYLASE in the last 8760 hours. No results for input(s): AMMONIA in the last 8760 hours. CBC: Recent Labs    03/05/19 1502 03/19/19 1010 06/07/19 1205  WBC 4.4 3.8 3.5*  NEUTROABS 2,072  --  1,628  HGB 12.7 12.1 12.5  HCT 38.7 36.4 38.6  MCV 90.6 89.2 89.8  PLT 271 259 253   Lipid Panel: Recent Labs    06/07/19 1205  CHOL 113  HDL 41*  LDLCALC 53  TRIG 103  CHOLHDL 2.8   TSH: Recent Labs    06/07/19 1205  TSH 1.96   A1C: No results found for: HGBA1C   Assessment/Plan 1. Fever and chills Pt lives with daughter and granddaughter who have both been positive for COVID they have tried to keep their distance after diagnosis. Suspect positive COVID results. Reports she is feeling better today. Has not used tylenol or anything to reduce fever today.  Appears well today and in no distress or does not appear ill during visit. Vital signs reassuring. -educated to stay in quarantine at this time.  -make sure you are staying well hydrated -recommended to take Vit C 1000 mg twice daily, Vit D 5000 units, zinc 50 mg daily for 14 days -to do deep breathing exercises every hour -mucinex twice daily as needed for chest  congestion -do not sit in bed all day, sit up in chair and walk around as tolerated -leg exercises to prevent blood clots in leg -education provided on when to follow up and when to seek immediate medication attention through the ED.  - POC Influenza A/B negative.  - SARS-COV-2 RNA,(COVID-19) QUAL NAAT   Daouda Lonzo K. Vale, [facility] [phone]

## 2019-12-27 NOTE — Patient Instructions (Addendum)
-make sure you are staying well hydrated -recommended to take Vit C 1000 mg twice daily, Vit D 5000 units, zinc 50 mg daily for 14 days -to do deep breathing exercises every hour -mucinex twice daily as needed for chest congestion -do not sit in bed all day, sit up in chair and walk around as tolerated -leg exercises to prevent blood clots in leg  -education provided on when to follow up and when to seek immediate medication attention through the ED.         Person Under Monitoring Name: Catherine Snyder  Location: [address]  [place] [address]   Infection Prevention Recommendations for Individuals Confirmed to have, or Being Evaluated for, 2019 Novel Coronavirus (COVID-19) Infection Who Receive Care at Home  Individuals who are confirmed to have, or are being evaluated for, COVID-19 should follow the prevention steps below until a healthcare provider or local or state health department says they can return to normal activities.  Stay home except to get medical care You should restrict activities outside your home, except for getting medical care. Do not go to work, school, or public areas, and do not use public transportation or taxis.  Call ahead before visiting your doctor Before your medical appointment, call the healthcare provider and tell them that you have, or are being evaluated for, COVID-19 infection. This will help the healthcare provider's office take steps to keep other people from getting infected. Ask your healthcare provider to call the local or state health department.  Monitor your symptoms Seek prompt medical attention if your illness is worsening (e.g., difficulty breathing). Before going to your medical appointment, call the healthcare provider and tell them that you have, or are being evaluated for, COVID-19 infection. Ask your healthcare provider to call the local or state health department.  Wear a facemask You should wear a facemask  that covers your nose and mouth when you are in the same room with other people and when you visit a healthcare provider. People who live with or visit you should also wear a facemask while they are in the same room with you.  Separate yourself from other people in your home As much as possible, you should stay in a different room from other people in your home. Also, you should use a separate bathroom, if available.  Avoid sharing household items You should not share dishes, drinking glasses, cups, eating utensils, towels, bedding, or other items with other people in your home. After using these items, you should wash them thoroughly with soap and water.  Cover your coughs and sneezes Cover your mouth and nose with a tissue when you cough or sneeze, or you can cough or sneeze into your sleeve. Throw used tissues in a lined trash can, and immediately wash your hands with soap and water for at least 20 seconds or use an alcohol-based hand rub.  Wash your [facility] your hands often and thoroughly with soap and water for at least 20 seconds. You can use an alcohol-based hand sanitizer if soap and water are not available and if your hands are not visibly dirty. Avoid touching your eyes, nose, and mouth with unwashed hands.   Prevention Steps for Caregivers and Household Members of Individuals Confirmed to have, or Being Evaluated for, COVID-19 Infection Being Cared for in the Home  If you live with, or provide care at home for, a person confirmed to have, or being evaluated for, COVID-19 infection please follow these  guidelines to prevent infection:  Follow healthcare provider's instructions Make sure that you understand and can help the patient follow any healthcare provider instructions for all care.  Provide for the patient's basic needs You should help the patient with basic needs in the home and provide support for getting groceries, prescriptions, and other personal  needs.  Monitor the patient's symptoms If they are getting sicker, call his or her medical provider and tell them that the patient has, or is being evaluated for, COVID-19 infection. This will help the healthcare provider's office take steps to keep other people from getting infected. Ask the healthcare provider to call the local or state health department.  Limit the number of people who have contact with the patient  If possible, have only one caregiver for the patient.  Other household members should stay in another home or place of residence. If this is not possible, they should stay  in another room, or be separated from the patient as much as possible. Use a separate bathroom, if available.  Restrict visitors who do not have an essential need to be in the home.  Keep older adults, very young children, and other sick people away from the patient Keep older adults, very young children, and those who have compromised immune systems or chronic health conditions away from the patient. This includes people with chronic heart, lung, or kidney conditions, diabetes, and cancer.  Ensure good ventilation Make sure that shared spaces in the home have good air flow, such as from an air conditioner or an opened window, weather permitting.  Wash your hands often  Wash your hands often and thoroughly with soap and water for at least 20 seconds. You can use an alcohol based hand sanitizer if soap and water are not available and if your hands are not visibly dirty.  Avoid touching your eyes, nose, and mouth with unwashed hands.  Use disposable paper towels to dry your hands. If not available, use dedicated cloth towels and replace them when they become wet.  Wear a facemask and gloves  Wear a disposable facemask at all times in the room and gloves when you touch or have contact with the patient's blood, body fluids, and/or secretions or excretions, such as sweat, saliva, sputum, nasal mucus,  vomit, urine, or feces.  Ensure the mask fits over your nose and mouth tightly, and do not touch it during use.  Throw out disposable facemasks and gloves after using them. Do not reuse.  Wash your hands immediately after removing your facemask and gloves.  If your personal clothing becomes contaminated, carefully remove clothing and launder. Wash your hands after handling contaminated clothing.  Place all used disposable facemasks, gloves, and other waste in a lined container before disposing them with other household waste.  Remove gloves and wash your hands immediately after handling these items.  Do not share dishes, glasses, or other household items with the patient  Avoid sharing household items. You should not share dishes, drinking glasses, cups, eating utensils, towels, bedding, or other items with a patient who is confirmed to have, or being evaluated for, COVID-19 infection.  After the person uses these items, you should wash them thoroughly with soap and water.  Wash laundry thoroughly  Immediately remove and wash clothes or bedding that have blood, body fluids, and/or secretions or excretions, such as sweat, saliva, sputum, nasal mucus, vomit, urine, or feces, on them.  Wear gloves when handling laundry from the patient.  Read and follow directions  on labels of laundry or clothing items and detergent. In general, wash and dry with the warmest temperatures recommended on the label.  Clean all areas the individual has used often  Clean all touchable surfaces, such as counters, tabletops, doorknobs, bathroom fixtures, toilets, phones, keyboards, tablets, and bedside tables, every day. Also, clean any surfaces that may have blood, body fluids, and/or secretions or excretions on them.  Wear gloves when cleaning surfaces the patient has come in contact with.  Use a diluted bleach solution (e.g., dilute bleach with 1 part bleach and 10 parts water) or a household disinfectant  with a label that says EPA-registered for coronaviruses. To make a bleach solution at home, add 1 tablespoon of bleach to 1 quart (4 cups) of water. For a larger supply, add  cup of bleach to 1 gallon (16 cups) of water.  Read labels of cleaning products and follow recommendations provided on product labels. Labels contain instructions for safe and effective use of the cleaning product including precautions you should take when applying the product, such as wearing gloves or eye protection and making sure you have good ventilation during use of the product.  Remove gloves and wash hands immediately after cleaning.  Monitor yourself for signs and symptoms of illness Caregivers and household members are considered close contacts, should monitor their health, and will be asked to limit movement outside of the home to the extent possible. Follow the monitoring steps for close contacts listed on the symptom monitoring form.   ? If you have additional questions, contact your local health department or call the epidemiologist on call at [phone] (available 24/7). ? This guidance is subject to change. For the most up-to-date guidance from [facility], please refer to their website: [email or web address]

## 2019-12-28 LAB — SARS-COV-2 RNA,(COVID-19) QUALITATIVE NAAT: SARS CoV2 RNA: DETECTED — CR

## 2020-01-09 ENCOUNTER — Ambulatory Visit: Payer: PPO

## 2020-01-26 ENCOUNTER — Other Ambulatory Visit: Payer: Self-pay | Admitting: Family

## 2020-01-29 ENCOUNTER — Other Ambulatory Visit: Payer: Self-pay | Admitting: Family

## 2020-02-22 ENCOUNTER — Other Ambulatory Visit: Payer: Self-pay | Admitting: Family

## 2020-02-22 MED ORDER — RIVAROXABAN 20 MG PO TABS: ORAL_TABLET | ORAL | 1 refills | Status: DC

## 2020-02-22 NOTE — Addendum Note (Signed)
Addended by: Heriberto Antigua E on: 02/22/2020 02:59 PM   Modules accepted: Orders

## 2020-02-22 NOTE — Telephone Encounter (Signed)
Addendum wrong provider listed "Sherrie Mustache, NP" on medication Xarelto. Correct provider "Dinah Ngetich,NP" . Pharmacy called and medication cancelled. Correct medication/ provider sent into pharmacy.

## 2020-03-26 ENCOUNTER — Other Ambulatory Visit: Payer: Self-pay | Admitting: Family

## 2020-03-26 DIAGNOSIS — G2581 Restless legs syndrome: Secondary | ICD-10-CM

## 2020-04-09 ENCOUNTER — Ambulatory Visit: Payer: PPO | Admitting: Family

## 2020-04-09 DIAGNOSIS — Z961 Presence of intraocular lens: Secondary | ICD-10-CM | POA: Diagnosis not present

## 2020-04-09 DIAGNOSIS — H25812 Combined forms of age-related cataract, left eye: Secondary | ICD-10-CM | POA: Diagnosis not present

## 2020-04-09 DIAGNOSIS — H26491 Other secondary cataract, right eye: Secondary | ICD-10-CM | POA: Diagnosis not present

## 2020-04-22 ENCOUNTER — Other Ambulatory Visit: Payer: Self-pay

## 2020-04-22 ENCOUNTER — Ambulatory Visit (INDEPENDENT_AMBULATORY_CARE_PROVIDER_SITE_OTHER): Payer: PPO | Admitting: Family

## 2020-04-22 ENCOUNTER — Encounter: Payer: Self-pay | Admitting: Family

## 2020-04-22 VITALS — BP 120/80 | HR 65 | Temp 97.3°F | Resp 16 | Ht 63.0 in | Wt 168.2 lb

## 2020-04-22 DIAGNOSIS — I10 Essential (primary) hypertension: Secondary | ICD-10-CM | POA: Diagnosis not present

## 2020-04-22 DIAGNOSIS — R413 Other amnesia: Secondary | ICD-10-CM

## 2020-04-22 DIAGNOSIS — G2581 Restless legs syndrome: Secondary | ICD-10-CM

## 2020-04-22 DIAGNOSIS — Z66 Do not resuscitate: Secondary | ICD-10-CM

## 2020-04-22 DIAGNOSIS — T45515A Adverse effect of anticoagulants, initial encounter: Secondary | ICD-10-CM | POA: Diagnosis not present

## 2020-04-22 DIAGNOSIS — M1812 Unilateral primary osteoarthritis of first carpometacarpal joint, left hand: Secondary | ICD-10-CM

## 2020-04-22 DIAGNOSIS — Z7901 Long term (current) use of anticoagulants: Secondary | ICD-10-CM

## 2020-04-22 DIAGNOSIS — E782 Mixed hyperlipidemia: Secondary | ICD-10-CM | POA: Diagnosis not present

## 2020-04-22 DIAGNOSIS — I2699 Other pulmonary embolism without acute cor pulmonale: Secondary | ICD-10-CM

## 2020-04-22 DIAGNOSIS — Z780 Asymptomatic menopausal state: Secondary | ICD-10-CM | POA: Diagnosis not present

## 2020-04-22 NOTE — Progress Notes (Signed)
Provider: Marlowe Sax FNP-C   Croix Presley, Nelda Bucks, NP  Patient Care Team: Destynie Toomey, Nelda Bucks, NP as PCP - General (Family Medicine)  Extended Emergency Contact Information Primary Emergency Contact: Kruczek,Jeanette Address: [address]           [place], [place] [address] [place] Phone: [phone] Mobile Phone: [phone] Relation: Daughter Secondary Emergency Contact: Chiquita Loth [place] Phone: [phone] Mobile Phone: [phone] Relation: Son  Code Status: Full Code  Goals of care: Advanced Directive information Advanced Directives 04/22/2020  Does Patient Have a Medical Advance Directive? Yes  Type of Paramedic of [place];Living will  Does patient want to make changes to medical advance directive? No - Patient declined  Copy of [place] in Chart? Yes - validated most recent copy scanned in chart (See row information)  Would patient like information on creating a medical advance directive? -     Chief Complaint  Patient presents with  . Medical Management of Chronic Issues    4 month follow up.  . Immunizations    Discuss the need for Influenza Vaccine, Covid Vaccine, and PNA Vaccine.    HPI:  Pt is a 80 y.o. female seen today for medical management of chronic diseases. She denies any acute issues this visit.she is here with her [place].Daughter states patient has some memory decline tends to get agitated easily towards family members.When reminded about paying rent he does not remember plans to pay rent and argues with the daughter.sometimes daughter leaves her alone then will come and remind her later.  Daughter told her plans to move out of the current rental house due to issues with mold but later told daughter that she was not told that they were moving out.she lives with her daughter and other family members.tends to creat arguments with other  grandchildren or family members when they have a family gathering.    Complains of increased urine frequency 3-4 times during the night. Drinks coffee in the mornings but none in the evenings.   Hypertension - no home blood pressure readings for review.she denies any symptoms of hypotension.taking her amlodipine 10 mg tablet daily.   Hyperlipidemia - on atorvastatin 80 mg tablet daily.she denies any muscle weakness or achyness.  PE - no shortness of breath,chest tightness,chest pain or palpitation reported.On Xarelto 20 mg tablet daily.reports nose bleeds sometimes but none recently.    RLS - symptoms stable on Ropinirole 0.5 mg tablet at bedtime. Tries to exercise legs.   Health maintenance  - Tdap vaccine - due for Tdap vaccine but declines.  - PNA vac - Has had PNA 13.due for PNA vac 23 but has declined all vaccine.   - COVID -19 vaccine - declined vaccine.   Influenza vaccine - declined vaccine.   Code Status - Reviewed goals of care with daughter Dolores Ewing and patient. She made daughter and provider aware that she does not want to be resuscitated in case of a cardiopulmonary event.she states if condition is declining " why bring me back" If still breathing would like limited medical intervention but no intubation.May administered I.V fluids and antibiotic if indicated but no feeding tube. MOST form has been filled out today and signed by patient. Reviewed goals of care and filled MOST form between 10:30 - 11:00. Answered question/ concern from daughter/HCPOA and patient to the best of my Knowledge.   Past Medical History:  Diagnosis Date  . Bladder  cancer [facility])    a. s/p resection in 06/2015  . Dysuria   . Frequency of urination   . Hematuria   . Hypertension   . Nocturia   . Pulmonary embolism ([place])   . Wears glasses   . Wears partial dentures    upper   Past Surgical History:  Procedure Laterality Date  . CARDIAC CATHETERIZATION N/A 02/04/2016   Procedure:  RIGHT/LEFT HEART CATH AND CORONARY ANGIOGRAPHY;  Surgeon: Troy Sine, MD;  Location: [facility];  Service: Cardiovascular;  Laterality: N/A;  . CYSTOSCOPY W/ RETROGRADES Bilateral 07/07/2015   Procedure: CYSTOSCOPY WITH RETROGRADE PYELOGRAM;  Surgeon: Cleon Gustin, MD;  Location: [facility];  Service: Urology;  Laterality: Bilateral;  . IR GENERIC HISTORICAL  02/07/2016   IR ANGIOGRAM SELECTIVE EACH ADDITIONAL VESSEL 02/07/2016 Jacqulynn Cadet, MD [facility]  . IR GENERIC HISTORICAL  02/07/2016   IR US GUIDE VASC ACCESS RIGHT 02/07/2016 Jacqulynn Cadet, MD [facility]  . IR GENERIC HISTORICAL  02/07/2016   IR ANGIOGRAM PULMONARY BILATERAL SELECTIVE 02/07/2016 Jacqulynn Cadet, MD [facility]  . IR GENERIC HISTORICAL  02/07/2016   IR INFUSION THROMBOL ARTERIAL INITIAL (MS) 02/07/2016 Jacqulynn Cadet, MD [facility]  . IR GENERIC HISTORICAL  02/07/2016   IR INFUSION THROMBOL ARTERIAL INITIAL (MS) 02/07/2016 Jacqulynn Cadet, MD [facility]  . IR GENERIC HISTORICAL  02/07/2016   IR ANGIOGRAM SELECTIVE EACH ADDITIONAL VESSEL 02/07/2016 Jacqulynn Cadet, MD [facility]  . NO PAST SURGERIES    . TRANSURETHRAL RESECTION OF BLADDER TUMOR WITH GYRUS (TURBT-GYRUS) N/A 07/07/2015   Procedure: TRANSURETHRAL RESECTION OF BLADDER TUMOR WITH GYRUS (TURBT-GYRUS);  Surgeon: Cleon Gustin, MD;  Location: [facility];  Service: Urology;  Laterality: N/A;    No Known Allergies  Allergies as of 04/22/2020   No Known Allergies     Medication List       Accurate as of April 22, 2020 10:15 AM. If you have any questions, ask your nurse or doctor.        STOP taking these medications   gabapentin 100 MG capsule Commonly known as: NEURONTIN Stopped by: Sandrea Hughs, NP     TAKE these medications   amLODipine 10 MG tablet Commonly known as: NORVASC TAKE 1 TABLET BY MOUTH EVERY DAY   atorvastatin 80 MG tablet Commonly known as:  LIPITOR TAKE ONE TABLET BY MOUTH ONCE DAILY AT 6PM   diclofenac Sodium 1 % Gel Commonly known as: VOLTAREN Apply 2 g topically as needed.   nitroGLYCERIN 0.4 MG SL tablet Commonly known as: NITROSTAT Place 1 tablet (0.4 mg total) under the tongue every 5 (five) minutes x 3 doses as needed for chest pain.   oxymetazoline 0.05 % nasal spray Commonly known as: AFRIN Place 1 spray into both nostrils 2 (two) times daily.   rivaroxaban 20 MG Tabs tablet Commonly known as: Xarelto TAKE ONE TABLET BY MOUTH ONCE DAILY WITH SUPPER   rOPINIRole 0.5 MG tablet Commonly known as: REQUIP TAKE 1 TABLET (0.5 MG TOTAL) BY MOUTH AT BEDTIME.   vitamin C 1000 MG tablet Take 1,000 mg by mouth daily.   Vitamin D 50 MCG (2000 UT) tablet Take 2,000 Units by mouth daily.       Review of Systems  Constitutional: Negative for appetite change, chills, fatigue and fever.  HENT: Positive for rhinorrhea. Negative for congestion, postnasal drip, sinus pressure, sinus pain, sneezing, sore throat and trouble swallowing.   Eyes: Positive for visual  disturbance. Negative for discharge, redness and itching.       Has eye cataract removal on left in 06/13/2020.   Respiratory: Negative for cough, chest tightness, shortness of breath and wheezing.   Cardiovascular: Negative for chest pain, palpitations and leg swelling.  Gastrointestinal: Negative for abdominal distention, abdominal pain, constipation, diarrhea, nausea and vomiting.  Endocrine: Negative for cold intolerance, heat intolerance, polydipsia, polyphagia and polyuria.  Genitourinary: Positive for frequency. Negative for difficulty urinating, dysuria, flank pain and urgency.       3-4 times void at night   Musculoskeletal: Positive for arthralgias. Negative for gait problem, joint swelling, myalgias and neck pain.       Right knee pain tylenol effective.No fall episode   Skin: Negative for color change, pallor and rash.  Neurological: Negative for  dizziness, seizures, speech difficulty, weakness, light-headedness, numbness and headaches.  Hematological: Does not bruise/bleed easily.  Psychiatric/Behavioral: Negative for agitation, behavioral problems, confusion, sleep disturbance and suicidal ideas. The patient is not nervous/anxious.        Sleeps well     Immunization History  Administered Date(s) Administered  . Influenza,inj,Quad PF,6+ Mos 11/27/2014  . Pneumococcal Conjugate-13 06/13/2017   Pertinent  Health Maintenance Due  Topic Date Due  . PNA vac Low Risk Adult (2 of 2 - PPSV23) 06/14/2018  . INFLUENZA VACCINE  06/12/2020 (Originally 10/14/2019)  . MAMMOGRAM  12/14/2020  . DEXA SCAN  Completed   Fall Risk  04/22/2020 07/17/2019 03/19/2019 03/05/2019 11/13/2018  Falls in the past year? 0 0 0 0 0  Number falls in past yr: 0 0 0 0 0  Injury with Fall? 0 0 0 0 0   Functional Status Survey:    Vitals:   04/22/20 1003  BP: 120/80  Pulse: 65  Resp: 16  Temp: (!) 97.3 F (36.3 C)  SpO2: 98%  Weight: 168 lb 3.2 oz (76.3 kg)  Height: $Remove'5\' 3"'iAITMVJ$  (1.6 m)   Body mass index is 29.8 kg/m. Physical Exam  Labs reviewed: Recent Labs    06/07/19 1205  NA 141  K 4.0  CL 106  CO2 26  GLUCOSE 138*  BUN 10  CREATININE 0.71  CALCIUM 9.4   Recent Labs    06/07/19 1205  AST 23  ALT 21  BILITOT 0.5  PROT 7.2   Recent Labs    06/07/19 1205  WBC 3.5*  NEUTROABS 1,628  HGB 12.5  HCT 38.6  MCV 89.8  PLT 253   Lab Results  Component Value Date   TSH 1.96 06/07/2019   No results found for: HGBA1C Lab Results  Component Value Date   CHOL 113 06/07/2019   HDL 41 (L) 06/07/2019   LDLCALC 53 06/07/2019   TRIG 103 06/07/2019   CHOLHDL 2.8 06/07/2019    Significant Diagnostic Results in last 30 days:  No results found.  Assessment/Plan 1. Essential hypertension B/p well controlled. - continue on Amlodipine 10 mg tablet daily. - continue on Statin.on rivaroxaban  - CBC with Differential/Platelet - CMP with  eGFR([facility]) - TSH  2. Mixed hyperlipidemia Previous LDL at goal. - continue on atorvastatin 80 mg tablet daily  - dietary and lifestyle modification advised.  - Lipid Panel  3. RLS (restless legs syndrome) Ropinirole 0.5 mg tablet at bedtime effective.  4. Primary osteoarthritis of first carpometacarpal joint of left hand Continue on Extra strength Tylenol 500 mg tablet every 6 hrs as needed.   5. Pulmonary embolism on long-term anticoagulation therapy (HCC) Asymptomatic. - continue  on rivaroxaban  - CBC with Differential/Platelet  6. Memory loss Decline forgetfulness and easily agitated and argues with family members. - Ambulatory referral to Neurology - Neuropsychologist for evaluation. Aware specialist office will call for appointment.   - Additional education Caregiver's KIt support material given to daughter during visit.   7. Postmenopausal estrogen deficiency No falls or fracture.  Aware imaging center will call to schedule appointment.  - DG Bone Density; Future  8.Code status  - DNR  Reviewed goals of care with daughter Tashonda Pinkus and patient. She made daughter and provider aware that she does not want to be resuscitated in case of a cardiopulmonary event.she states if condition is declining " why bring me back" If still breathing would like limited medical intervention but no intubation.May administered I.V fluids and antibiotic if indicated but no feeding tube. MOST form has been filled out today and signed by patient. Reviewed goals of care and filled MOST form between 10:30 - 11:00. Answered question/ concern from daughter/HCPOA and patient to the best of my Knowledge.  Printed two copies and given to daughter advised to place where visible by EMS and other family members.   Family/ staff Communication: Reviewed plan of care with patient  Labs/tests ordered:   - CBC with Differential/Platelet - CMP with eGFR([facility]) - TSH - Lipid Panel  Next  Appointment : 6 months for medical management of chronic issues.   Sandrea Hughs, NP

## 2020-04-22 NOTE — Patient Instructions (Signed)
-   Refer ordered for Neuropsychologist specialist office will call you for appointment

## 2020-04-23 LAB — COMPLETE METABOLIC PANEL WITH GFR
AG Ratio: 1.2 (calc) (ref 1.0–2.5)
ALT: 18 U/L (ref 6–29)
AST: 22 U/L (ref 10–35)
Albumin: 4.1 g/dL (ref 3.6–5.1)
Alkaline phosphatase (APISO): 103 U/L (ref 37–153)
BUN/Creatinine Ratio: 33 (calc) — ABNORMAL HIGH (ref 6–22)
BUN: 17 mg/dL (ref 7–25)
CO2: 27 mmol/L (ref 20–32)
Calcium: 9.4 mg/dL (ref 8.6–10.4)
Chloride: 107 mmol/L (ref 98–110)
Creat: 0.52 mg/dL — ABNORMAL LOW (ref 0.60–0.93)
GFR, Est African American: 106 mL/min/{1.73_m2} (ref >=60)
GFR, Est Non African American: 92 mL/min/{1.73_m2} (ref >=60)
Globulin: 3.5 g/dL (calc) (ref 1.9–3.7)
Glucose, Bld: 127 mg/dL — ABNORMAL HIGH (ref 65–99)
Potassium: 4 mmol/L (ref 3.5–5.3)
Sodium: 141 mmol/L (ref 135–146)
Total Bilirubin: 0.4 mg/dL (ref 0.2–1.2)
Total Protein: 7.6 g/dL (ref 6.1–8.1)

## 2020-04-23 LAB — CBC WITH DIFFERENTIAL/PLATELET
Absolute Monocytes: 477 cells/uL (ref 200–950)
Basophils Absolute: 11 cells/uL (ref 0–200)
Basophils Relative: 0.3 %
Eosinophils Absolute: 81 cells/uL (ref 15–500)
Eosinophils Relative: 2.2 %
HCT: 38.3 % (ref 35.0–45.0)
Hemoglobin: 12.8 g/dL (ref 11.7–15.5)
Lymphs Abs: 1154 cells/uL (ref 850–3900)
MCH: 30.5 pg (ref 27.0–33.0)
MCHC: 33.4 g/dL (ref 32.0–36.0)
MCV: 91.2 fL (ref 80.0–100.0)
MPV: 10.2 fL (ref 7.5–12.5)
Monocytes Relative: 12.9 %
Neutro Abs: 1976 cells/uL (ref 1500–7800)
Neutrophils Relative %: 53.4 %
Platelets: 269 10*3/uL (ref 140–400)
RBC: 4.2 10*6/uL (ref 3.80–5.10)
RDW: 13.9 % (ref 11.0–15.0)
Total Lymphocyte: 31.2 %
WBC: 3.7 10*3/uL — ABNORMAL LOW (ref 3.8–10.8)

## 2020-04-23 LAB — LIPID PANEL
Cholesterol: 101 mg/dL (ref <200)
HDL: 44 mg/dL — ABNORMAL LOW (ref >=50)
LDL Cholesterol (Calc): 41 mg/dL (calc)
Non-HDL Cholesterol (Calc): 57 mg/dL (calc) (ref <130)
Total CHOL/HDL Ratio: 2.3 (calc) (ref <5.0)
Triglycerides: 84 mg/dL (ref <150)

## 2020-04-23 LAB — TSH: TSH: 1.66 mIU/L (ref 0.40–4.50)

## 2020-04-29 ENCOUNTER — Encounter: Payer: Self-pay | Admitting: Psychology

## 2020-06-30 ENCOUNTER — Ambulatory Visit (INDEPENDENT_AMBULATORY_CARE_PROVIDER_SITE_OTHER): Payer: PPO | Admitting: Psychology

## 2020-06-30 ENCOUNTER — Other Ambulatory Visit: Payer: Self-pay

## 2020-06-30 ENCOUNTER — Ambulatory Visit: Payer: PPO | Admitting: Psychology

## 2020-06-30 ENCOUNTER — Encounter: Payer: Self-pay | Admitting: Psychology

## 2020-06-30 DIAGNOSIS — F411 Generalized anxiety disorder: Secondary | ICD-10-CM | POA: Diagnosis not present

## 2020-06-30 DIAGNOSIS — R4189 Other symptoms and signs involving cognitive functions and awareness: Secondary | ICD-10-CM

## 2020-06-30 DIAGNOSIS — G3184 Mild cognitive impairment, so stated: Secondary | ICD-10-CM

## 2020-06-30 DIAGNOSIS — I248 Other forms of acute ischemic heart disease: Secondary | ICD-10-CM | POA: Diagnosis not present

## 2020-06-30 HISTORY — DX: Mild cognitive impairment of uncertain or unknown etiology: G31.84

## 2020-06-30 NOTE — Progress Notes (Signed)
   Psychometrician Note   Cognitive testing was administered to Catherine Snyder by Catherine Snyder, B.S. (psychometrist) under the supervision of Dr. Christia Reading, Ph.D., licensed psychologist on 06/30/20. Catherine Snyder did not appear overtly distressed by the testing session per behavioral observation or responses across self-report questionnaires. Rest breaks were offered.    The battery of tests administered was selected by Dr. Christia Reading, Ph.D. with consideration to Catherine Snyder's current level of functioning, the nature of her symptoms, emotional and behavioral responses during interview, level of literacy, observed level of motivation/effort, and the nature of the referral question. This battery was communicated to the psychometrist. Communication between Dr. Christia Reading, Ph.D. and the psychometrist was ongoing throughout the evaluation and Dr. Christia Reading, Ph.D. was immediately accessible at all times. Dr. Christia Reading, Ph.D. provided supervision to the psychometrist on the date of this service to the extent necessary to assure the quality of all services provided.    Catherine Snyder will return within approximately 1-2 weeks for an interactive feedback session with Catherine Snyder at which time her test performances, clinical impressions, and treatment recommendations will be reviewed in detail. Catherine Snyder understands she can contact our office should she require our assistance before this time.  A total of 125 minutes of billable time were spent face-to-face with Catherine Snyder by the psychometrist. This includes both test administration and scoring time. Billing for these services is reflected in the clinical report generated by Dr. Christia Reading, Ph.D.  This note reflects time spent with the psychometrician and does not include test scores or any clinical interpretations made by Catherine Snyder. The full report will follow in a separate note.

## 2020-06-30 NOTE — Progress Notes (Signed)
NEUROPSYCHOLOGICAL EVALUATION [place] [place] Department of Neurology  Date of Evaluation: June 30, 2020  Reason for Referral:   Catherine Snyder is a 80 y.o. right-handed African-American female referred by Marlowe Sax, NP, to characterize her current cognitive functioning and assist with diagnostic clarity and treatment planning in the context of subjective cognitive decline.   Assessment and Plan:   Clinical Impression(s): Catherine Snyder pattern of performance is suggestive of fairly diffuse cognitive impairment with numerous domains exhibiting impairment. Performance was appropriate across attention/concentration, phonemic fluency, and clock drawing, while semantic fluency was variable. However, all other performances (i.e., processing speed, executive functioning, receptive language, confrontation naming, other visuospatial abilities, and learning and memory) were far below expectation relative to age-matched peers. Catherine Snyder denied difficulties completing instrumental activities of daily living (ADLs) independently and this was not contradicted by her daughter. As such, given evidence for cognitive dysfunction described above, she meets criteria for a Mild Neurocognitive Disorder ("mild cognitive impairment") at the present time.  The etiology for cognitive dysfunction is unclear given the somewhat broad and diffuse nature of exhibited impairments. She did report acute levels of moderate anxiety and mild depression; however, cognitive performances are beyond what would be expected from mood concerns alone. As such, the presence of an underlying neurodegenerative condition should certainly be considered. When considering base rates, Alzheimer's disease is most common and Catherine Snyder's performance is not inconsistent with this presentation. She exhibited deficits across all aspects of memory, including delayed retrieval (retention rates ranging from 0-40%) and  storage, which is the hallmark characteristic of this condition. Deficits in confrontation naming and executive functioning would be further consistent. Given numerous cardiovascular ailments, there could additionally be co-occurring cerebrovascular contributions. Lewy body dementia seems less likely given that there was no report of fully-formed visual hallucinations, REM sleep behaviors, or parkinsonian features. I do not have concerns for frontotemporal dementia at the present time. Continued medical monitoring will be important moving forward.   Recommendations: A repeat neuropsychological evaluation in 12-18 months (or sooner if functional decline is noted) is recommended to assess the trajectory of future cognitive decline should it occur. This will also aid in future efforts towards improved diagnostic clarity.  Catherine Snyder does not appear to be followed by a neurologist at the present time. I will place a referral for her. When meeting with her neurologist, she could discuss medication options for ongoing memory loss. It is important to highlight that certain medications have been shown to slow functional decline in some individuals. No current treatment is able to reverse or stop cognitive decline. Neuroimaging (i.e., brain MRI) could also be considered.   Catherine Snyder is also encouraged to speak with her PCP regarding medication adjustments to optimally manage mood symptoms.   Should there be a progression of her current deficits over time, Catherine Snyder is unlikely to regain any independent living skills lost. Therefore, it is recommended that she remain as involved as possible in all aspects of household chores, finances, and medication management, with supervision to ensure adequate performance. She will likely benefit from the establishment and maintenance of a routine in order to maximize her functional abilities over time.  It will be important for Catherine Snyder to have another person with her when  in situations where she may need to process information, weigh the pros and cons of different options, and make decisions, in order to ensure that she fully understands and recalls all information to be considered.  If not  already done, Catherine Snyder and her family may want to discuss her wishes regarding durable power of attorney and medical decision making, so that she can have input into these choices. Additionally, they may wish to discuss future plans for caretaking and seek out community options for in home/residential care should they become necessary.  Catherine Snyder is encouraged to attend to lifestyle factors for brain health (e.g., regular physical exercise, good nutrition habits, regular participation in cognitively-stimulating activities, and general stress management techniques), which are likely to have benefits for both emotional adjustment and cognition. In fact, in addition to promoting good general health, regular exercise incorporating aerobic activities (e.g., brisk walking, jogging, cycling, etc.) has been demonstrated to be a very effective treatment for depression and stress, with similar efficacy rates to both antidepressant medication and psychotherapy. Optimal control of vascular risk factors (including safe cardiovascular exercise and adherence to dietary recommendations) is encouraged.  Important information to remember should be provided in written format in all instances. This should be placed in a highly visible and commonly frequented location of her resident to help promote recall.   To address problems with processing speed, she may wish to consider:   -Ensuring that she is alerted when essential material or instructions are being presented   -Adjusting the speed at which new information is presented   -Allowing for more time in comprehending, processing, and responding in conversation  To address problems with executive dysfunction, she may wish to consider:   -Avoiding  external distractions when needing to concentrate   -Limiting exposure to fast paced environments with multiple sensory demands   -Writing down complicated information and using checklists   -Attempting and completing one task at a time (i.e., no multi-tasking)   -Verbalizing aloud each step of a task to maintain focus   -Reducing the amount of information considered at one time   Review of Records:   Catherine Snyder was seen by [facility] Marlowe Sax, NP) most recently on 04/22/2020 for follow-up. During that appointment, her daughter expressed some concerns surrounding mild memory decline and personality changes (e.g., Catherine Snyder tends to get easily agitated towards family members). Ms. Pressly was also noted to have become more argumentative lately. Other medical conditions were said to be managed well at the present time. Ultimately, Catherine Snyder was referred for a comprehensive neuropsychological evaluation to characterize her cognitive abilities and to assist with diagnostic clarity and treatment planning.   No neuroimaging was available for my review.   Past Medical History:  Diagnosis Date  . Absolute anemia   . CAD in native artery    40% LAD at cath 02/04/16  . Chronic anticoagulation 03/02/2016  . Demand ischemia of myocardium - Not NSTEMI 02/03/2016  . Dyslipidemia 03/02/2016  . Dyspnea on exertion   . Elevated troponin   . Essential hypertension 11/27/2014  . Frequency of urination   . Hematuria   . History of bladder cancer    a. s/p resection in 06/2015  . Nocturia   . Primary osteoarthritis of first carpometacarpal joint of left hand 04/22/2017  . Pulmonary hypertension   . Pulmonary hypertension due to thromboembolism 02/05/2016  . RLS (restless legs syndrome) 05/21/2016  . Saddle embolus of pulmonary artery without acute cor pulmonale    Acute saddle PE treated with direct thrombolytics 02/07/16  . Wears glasses   . Wears partial dentures    upper    Past  Surgical History:  Procedure Laterality Date  . CARDIAC CATHETERIZATION  N/A 02/04/2016   Procedure: RIGHT/LEFT HEART CATH AND CORONARY ANGIOGRAPHY;  Surgeon: Troy Sine, MD;  Location: [facility];  Service: Cardiovascular;  Laterality: N/A;  . CYSTOSCOPY W/ RETROGRADES Bilateral 07/07/2015   Procedure: CYSTOSCOPY WITH RETROGRADE PYELOGRAM;  Surgeon: Cleon Gustin, MD;  Location: [facility];  Service: Urology;  Laterality: Bilateral;  . IR GENERIC HISTORICAL  02/07/2016   IR ANGIOGRAM SELECTIVE EACH ADDITIONAL VESSEL 02/07/2016 Jacqulynn Cadet, MD [facility]  . IR GENERIC HISTORICAL  02/07/2016   IR US GUIDE VASC ACCESS RIGHT 02/07/2016 Jacqulynn Cadet, MD [facility]  . IR GENERIC HISTORICAL  02/07/2016   IR ANGIOGRAM PULMONARY BILATERAL SELECTIVE 02/07/2016 Jacqulynn Cadet, MD [facility]  . IR GENERIC HISTORICAL  02/07/2016   IR INFUSION THROMBOL ARTERIAL INITIAL (MS) 02/07/2016 Jacqulynn Cadet, MD [facility]  . IR GENERIC HISTORICAL  02/07/2016   IR INFUSION THROMBOL ARTERIAL INITIAL (MS) 02/07/2016 Jacqulynn Cadet, MD [facility]  . IR GENERIC HISTORICAL  02/07/2016   IR ANGIOGRAM SELECTIVE EACH ADDITIONAL VESSEL 02/07/2016 Jacqulynn Cadet, MD [facility]  . NO PAST SURGERIES    . TRANSURETHRAL RESECTION OF BLADDER TUMOR WITH GYRUS (TURBT-GYRUS) N/A 07/07/2015   Procedure: TRANSURETHRAL RESECTION OF BLADDER TUMOR WITH GYRUS (TURBT-GYRUS);  Surgeon: Cleon Gustin, MD;  Location: [facility];  Service: Urology;  Laterality: N/A;    Current Outpatient Medications:  .  acetaminophen (TYLENOL) 500 MG tablet, Take 500 mg by mouth every 6 (Snyder) hours as needed for moderate pain., Disp: , Rfl:  .  amLODipine (NORVASC) 10 MG tablet, TAKE 1 TABLET BY MOUTH EVERY DAY, Disp: 90 tablet, Rfl: 1 .  Ascorbic Acid (VITAMIN C) 1000 MG tablet, Take 1,000 mg by mouth daily., Disp: , Rfl:  .  atorvastatin (LIPITOR) 80 MG tablet, TAKE ONE  TABLET BY MOUTH ONCE DAILY AT 6PM, Disp: 90 tablet, Rfl: 1 .  Cholecalciferol (VITAMIN D) 2000 units tablet, Take 2,000 Units by mouth daily., Disp: , Rfl:  .  diclofenac Sodium (VOLTAREN) 1 % GEL, Apply 2 g topically as needed., Disp: , Rfl:  .  nitroGLYCERIN (NITROSTAT) 0.4 MG SL tablet, Place 1 tablet (0.4 mg total) under the tongue every 5 (five) minutes x 3 doses as needed for chest pain., Disp: 30 tablet, Rfl: 0 .  oxymetazoline (AFRIN) 0.05 % nasal spray, Place 1 spray into both nostrils 2 (two) times daily., Disp: 30 mL, Rfl: 0 .  rivaroxaban (XARELTO) 20 MG TABS tablet, TAKE ONE TABLET BY MOUTH ONCE DAILY WITH SUPPER, Disp: 90 tablet, Rfl: 1 .  rOPINIRole (REQUIP) 0.5 MG tablet, TAKE 1 TABLET (0.5 MG TOTAL) BY MOUTH AT BEDTIME., Disp: 90 tablet, Rfl: 1  Clinical Interview:   The following information was obtained during a clinical interview with Catherine Snyder and her daughter prior to cognitive testing.  Cognitive Symptoms: Decreased short-term memory: Endorsed. Catherine Snyder noted that she has trouble remembering the current date or day of the week and will occasionally misplace or lose things around her residence. She did not report significant memory concerns other than this. Her daughter also denied significant memory concerns, stating that her mother's memory seems "mostly intact" at the present time. Decreased long-term memory: Denied. Decreased attention/concentration: Denied. Reduced processing speed: Endorsed "sometimes," particularly when feeling fatigued or stressed out/worked up.  Difficulties with executive functions: Endorsed. She reported occasional difficulties with organization, attributing this to trouble losing or misplacing things at times. She also reported some increased indecision and may be  more prone to seek out the advice of other family members. Trouble with impulsivity was denied. Her daughter noted personality changes, especially during the past month or two, where  Catherine Snyder seems easily agitated and may snap at someone quite quickly. Records also suggest that Catherine Snyder has seemed more argumentative.  Difficulties with emotion regulation: Denied outside of what is described above.  Difficulties with receptive language: Denied. Difficulties with word finding: Endorsed. She reported occasionally stuttering due to words being on the tip of her tongue.  Decreased visuoperceptual ability: Largely denied. She noted occasionally bumping into things while ambulating but attributed this to mild balance concerns.   Trajectory of deficits: Deficits were said to be mild in nature and a direct timeline was not provided outside of personality changes seeming worse during the past month or two.   Difficulties completing ADLs: Denied. Ms. Kelliher manages her medications and bill paying independently. Her daughter confirmed this, stating that she will occasionally check to make sure that things are appropriate and does not have to intervene. Ms. Woodroof does not drive. Her daughter stated that she has never obtained a drivers license.   Additional Medical History: History of traumatic brain injury/concussion: Denied. History of stroke: Denied. History of seizure activity: Denied. History of known exposure to toxins: Denied. Symptoms of chronic pain: Endorsed. Symptoms were generally arthritic in nature and particularly impact her right knee.  Experience of frequent headaches/migraines: Denied. She did acknowledge a remote history of significant headache symptoms.  Frequent instances of dizziness/vertigo: Denied.  Sensory changes: She wears glasses with positive effect. She had cataract surgery on her right eye in the past and is scheduled for surgery on her left eye next week. Other sensory changes/difficulties (e.g., hearing, taste, or smell) were denied.  Balance/coordination difficulties: Endorsed. Generally mild balance instability was largely attributed to symptoms of  arthritis, particularly impacting her right knee. She denied any recent falls.  Other motor difficulties: Denied.  Sleep History: Estimated hours obtained each night: 6-8 hours.  Difficulties falling asleep: Denied. Difficulties staying asleep: Denied. Feels rested and refreshed upon awakening: Endorsed. However, she did note that she included time spent taking daytime naps in her 6-8 hour sleep estimation.   History of snoring: Unclear.  History of waking up gasping for air: Denied. Witnessed breath cessation while asleep: Denied.  History of vivid dreaming: Denied. Excessive movement while asleep: Endorsed. She noted tossing and turning throughout the night in order to get comfortable. Medical records also suggest a history of RLS.  Instances of acting out her dreams: Denied.  Psychiatric/Behavioral Health History: Depression: She described her current mood as "okay" and denied to her knowledge any prior mental health concerns or diagnoses. Current or remote suicidal ideation, intent, or plan was denied.  Anxiety: Denied. Mania: Denied. Trauma History: Denied. Visual/auditory hallucinations: Denied. Her daughter did report observing Ms. Haggard talking to herself on several occassions. When asked, Ms. Hollin denied auditory hallucinations and stated that she was simply talking to herself and/or working out a problem in her mind.  Delusional thoughts: Denied.  Tobacco: Denied. Alcohol: She denied current alcohol consumption as well as a history of problematic alcohol abuse or dependence.  Recreational drugs: Denied. Caffeine: One 16oz cup of coffee in the morning.   Family History: Problem Relation Age of Onset  . CAD Mother 54  . Diabetes Mother   . Seizures Mother   . Heart disease Father 37  . Seizures Brother   . CAD Brother 78  This information was confirmed by Ms. Behne.  Academic/Vocational History: Highest level of educational attainment: 12 years. She graduated  from high school, describing herself as an average (C) student in academic settings. Math was noted as a likely relative weakness.  History of developmental delay: Denied. History of grade repetition: Denied. Enrollment in special education courses: Denied. History of LD/ADHD: Denied.  Employment: Retired. She grew up working on a farm. 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   Evaluation Results:   Behavioral Observations: Ms. Rupnow was accompanied by her daughter, arrived to her appointment on time, and was appropriately dressed and groomed. She appeared alert and oriented. Observed gait and station were within normal limits. Gross motor functioning appeared intact upon informal observation and no abnormal movements (e.g., tremors) were noted. Her affect was relaxed and positive. Spontaneous speech was fluent and word finding difficulties were not observed during the clinical interview. Thought processes were coherent, organized, and normal in content. Insight into her cognitive difficulties appeared somewhat limited in that objective testing revealed far greater dysfunction than what was reported by Ms. Cale.   During testing, sustained attention was variable and there were times where Ms. Fruchter was observed stating to herself "I need to pay more attention." It was also noted by the psychometrist that Ms. Lannen required re-direction during testing. Task engagement was adequate and she persisted when challenged. She exhibited difficulties understanding task instructions at times (TMT A&B, D-KEFS Color Word, Coding, Line Orientation), requiring the later conditions of one task (D-KEFS Color Word) to be discontinued entirely. Overall, Ms. Panjwani was cooperative with the clinical interview and subsequent testing procedures.   Adequacy of Effort: The validity of neuropsychological testing is limited by the extent to which  the individual being tested may be assumed to have exerted adequate effort during testing. Ms. Giebler expressed her intention to perform to the best of her abilities and exhibited adequate task engagement and persistence. Scores across stand-alone and embedded performance validity measures were variable but largely within expectation. One below expectation may be due to true cognitive dysfunction rather than poor engagement or attempts to perform poorly. As such, the results of the current evaluation are believed to be a valid representation of Ms. Geng's current cognitive functioning.  Test Results: Ms. Arrambide was generally oriented at the time of the current evaluation. She incorrectly stated her current age ("60") and was unaware of the name of the clinic testing was being completed in.   Intellectual abilities based upon educational and vocational attainment were estimated to be in the average range. Premorbid abilities were estimated to be within the below average range based upon a single-word reading test.   Processing speed was exceptionally low to below average. Basic attention was below average to average. More complex attention (e.g., working memory) was also below average to average. Executive functioning was exceptionally low to well below average. She performed in the below average range on a task assessing safety and judgment.  Assessed receptive language abilities were well below average with significant difficulties exhibited understanding more complex sentence structure and following multi-step commands. Likewise, Ms. Olden also exhibited difficulties comprehending several task instructions. Assessed expressive language was variable. Phonemic fluency was below average, semantic fluency was well below average to average, and confrontation naming was exceptionally low.      Assessed visuospatial/visuoconstructional abilities were exceptionally low to well below average outside of her  drawing of a clock.  Points were lost on her copy of a complex figure due to a sloppy approach, mild visual distortions, and some generally mild spatial/placement abnormalities.    Learning (i.e., encoding) of novel verbal information was exceptionally low to well below average. Spontaneous delayed recall (i.e., retrieval) of previously learned information was below average on a list learning task (raw score of 2) but exceptionally low to well below average across story and figure tasks. Retention rates were 0% across a story learning task, 40% across a list learning task, and 27% across a figure drawing task. Performance across recognition tasks was exceptionally low to well below average, suggesting limited evidence for information consolidation.   Results of emotional screening instruments suggested that recent symptoms of generalized anxiety were in the moderate range, while symptoms of depression were within the mild range. A screening instrument assessing recent sleep quality suggested the presence of minimal sleep dysfunction.  Tables of Scores:   Note: This summary of test scores accompanies the interpretive report and should not be considered in isolation without reference to the appropriate sections in the text. Descriptors are based on appropriate normative data and may be adjusted based on clinical judgment. The terms "impaired" and "within normal limits (WNL)" are used when a more specific level of functioning cannot be determined.       Validity Testing:   DESCRIPTOR       Dot Counting Test: --- --- Within Expectation  RBANS Effort Index: --- --- Within Expectation  WAIS-IV Reliable Digit Span: --- --- Below Expectation       Orientation:      Raw Score Percentile   NAB Orientation, Form 1 26/29 --- ---       Cognitive Screening:           Raw Score Percentile   SLUMS: 16/30 --- ---       RBANS, Form A: Standard Score/ Scaled Score Percentile   Total Score 53 <1 Exceptionally  Low  Immediate Memory 53 <1 Exceptionally Low    List Learning 4 2 Well Below Average    Story Memory 1 <1 Exceptionally Low  Visuospatial/Constructional 60 <1 Exceptionally Low    Figure Copy 5 5 Well Below Average    Line Orientation 5/20 <2 Exceptionally Low  Language 64 1 Exceptionally Low    Picture Naming 7/10 <2 Exceptionally Low    Semantic Fluency 5 5 Well Below Average  Attention 75 5 Well Below Average    Digit Span 10 50 Average    Coding 2 <1 Exceptionally Low  Delayed Memory 52 <1 Exceptionally Low    List Recall 2/10 10-16 Below Average    List Recognition 15/20 <2 Exceptionally Low    Story Recall 1 <1 Exceptionally Low    Story Recognition 7/12 5-7 Well Below Average    Figure Recall 4 2 Well Below Average    Figure Recognition 2/8 2-3 Well Below Average       Intellectual Functioning:           Standard Score Percentile   Test of Premorbid Functioning: 84 14 Below Average       Attention/Executive Function:          Trail Making Test (TMT): Raw Score (T Score) Percentile     Part A 127 secs.,  3 errors (28) 2 Exceptionally Low    Part B Discontinued --- Impaired         Scaled Score Percentile   WAIS-IV Digit Span: 6 9 Below Average  Forward 6 9 Below Average    Backward 6 9 Below Average    Sequencing 8 25 Average        Scaled Score Percentile   WAIS-IV Similarities: 5 5 Well Below Average       D-KEFS Color-Word Interference Test: Raw Score (Scaled Score) Percentile     Color Naming 55 secs. (1) <1 Exceptionally Low    Word Reading 33 secs. (6) 9 Below Average    Inhibition Discontinued --- Impaired    Inhibition/Switching Discontinued --- Impaired       NAB Executive Functions Module, Form 1: T Score Percentile     Judgment 37 9 Below Average       Language:          Verbal Fluency Test: Raw Score (Scaled Score) Percentile     Phonemic Fluency (CFL) 23 (7) 16 Below Average    Category Fluency 31 (8) 25 Average  *Based on [facility] Older  Normative Studies (MOANS)          NAB Language Module, Form 1: T Score Percentile     Auditory Comprehension 34 5 Well Below Average    Naming 20/31 (22) <1 Exceptionally Low       Visuospatial/Visuoconstruction:      Raw Score Percentile   Clock Drawing: 10/10 --- Within Normal Limits       Mood and Personality:      Raw Score Percentile   Geriatric Depression Scale: 10 --- Mild  Geriatric Anxiety Scale: 22 --- Moderate    Somatic 9 --- Mild    Cognitive 7 --- Moderate    Affective 6 --- Mild       Additional Questionnaires:      Raw Score Percentile   PROMIS Sleep Disturbance Questionnaire: 22 --- None to Slight   Informed Consent and Coding/Compliance:   The current evaluation represents a clinical evaluation for the purposes previously outlined by the referral source and is in no way reflective of a forensic evaluation.   Ms. Hancock was provided with a verbal description of the nature and purpose of the present neuropsychological evaluation. Also reviewed were the foreseeable risks and/or discomforts and benefits of the procedure, limits of confidentiality, and mandatory reporting requirements of this provider. The patient was given the opportunity to ask questions and receive answers about the evaluation. Oral consent to participate was provided by the patient.   This evaluation was conducted by Christia Reading, Ph.D., licensed clinical neuropsychologist. Ms. Sottile completed a clinical interview with Dr. Melvyn Novas, billed as one unit [phone], and 125 minutes of cognitive testing and scoring, billed as one unit [phone] and three additional units [id number]. Psychometrist Milana Kidney, B.S., assisted Dr. Melvyn Novas with test administration and scoring procedures. As a separate and discrete service, Dr. Melvyn Novas spent a total of 125 minutes in interpretation and report writing billed as one unit [phone] and one unit [phone].

## 2020-07-07 ENCOUNTER — Ambulatory Visit (INDEPENDENT_AMBULATORY_CARE_PROVIDER_SITE_OTHER): Payer: PPO | Admitting: Psychology

## 2020-07-07 ENCOUNTER — Other Ambulatory Visit: Payer: Self-pay

## 2020-07-07 DIAGNOSIS — G3184 Mild cognitive impairment, so stated: Secondary | ICD-10-CM | POA: Diagnosis not present

## 2020-07-07 NOTE — Progress Notes (Signed)
   Neuropsychology Feedback Session Catherine Snyder Department of Neurology  Reason for Referral:   Catherine Snyder a 80 y.o. right-handed African-American female referred by Marlowe Sax, NP,to characterize hercurrent cognitive functioning and assist with diagnostic clarity and treatment planning in the context of subjective cognitive decline.   Feedback:   [name] completed a comprehensive neuropsychological evaluation on 06/30/2020. Please refer to that encounter for the full report and recommendations. Briefly, results suggested fairly diffuse cognitive impairment with numerous domains exhibiting impairment. Performance was appropriate across attention/concentration, phonemic fluency, and clock drawing, while semantic fluency was variable. However, all other performances (i.e., processing speed, executive functioning, receptive language, confrontation naming, other visuospatial abilities, and learning and memory) were far below expectation relative to age-matched peers. As such, the presence of an underlying neurodegenerative condition should certainly be considered. When considering base rates, Alzheimer's disease is most common and [name]'s performance is not inconsistent with this presentation. She exhibited deficits across all aspects of memory, including delayed retrieval (retention rates ranging from 0-40%) and storage, which is the hallmark characteristic of this condition. Deficits in confrontation naming and executive functioning would be further consistent. Given numerous cardiovascular ailments, there could additionally be co-occurring cerebrovascular contributions.  [name] was accompanied by her daughter during the current feedback session. Content of the current session focused on the results of her neuropsychological evaluation. [name] and her daughter were given the opportunity to ask questions and their questions were answered. They were  encouraged to reach out should additional questions arise. A copy of her report was provided at the conclusion of the visit.      25 minutes were spent conducting the current feedback session with [name], billed as one unit [phone].

## 2020-07-17 ENCOUNTER — Other Ambulatory Visit: Payer: Self-pay | Admitting: Family

## 2020-07-18 ENCOUNTER — Encounter: Payer: PPO | Admitting: Family

## 2020-08-05 ENCOUNTER — Encounter: Payer: PPO | Admitting: Family

## 2020-08-05 DIAGNOSIS — H25812 Combined forms of age-related cataract, left eye: Secondary | ICD-10-CM | POA: Diagnosis not present

## 2020-08-05 DIAGNOSIS — Z961 Presence of intraocular lens: Secondary | ICD-10-CM | POA: Diagnosis not present

## 2020-08-05 DIAGNOSIS — H26491 Other secondary cataract, right eye: Secondary | ICD-10-CM | POA: Diagnosis not present

## 2020-08-05 DIAGNOSIS — H04123 Dry eye syndrome of bilateral lacrimal glands: Secondary | ICD-10-CM | POA: Diagnosis not present

## 2020-08-06 DIAGNOSIS — H2512 Age-related nuclear cataract, left eye: Secondary | ICD-10-CM | POA: Diagnosis not present

## 2020-08-08 DIAGNOSIS — H25812 Combined forms of age-related cataract, left eye: Secondary | ICD-10-CM | POA: Diagnosis not present

## 2020-08-28 ENCOUNTER — Encounter: Payer: PPO | Admitting: Family

## 2020-08-29 ENCOUNTER — Other Ambulatory Visit: Payer: Self-pay | Admitting: Family

## 2020-09-01 ENCOUNTER — Ambulatory Visit (INDEPENDENT_AMBULATORY_CARE_PROVIDER_SITE_OTHER): Payer: PPO | Admitting: Family

## 2020-09-01 ENCOUNTER — Other Ambulatory Visit: Payer: Self-pay

## 2020-09-01 ENCOUNTER — Encounter: Payer: Self-pay | Admitting: Family

## 2020-09-01 DIAGNOSIS — Z0000 Encounter for general adult medical examination without abnormal findings: Secondary | ICD-10-CM

## 2020-09-01 NOTE — Progress Notes (Signed)
This service is provided via telemedicine  No vital signs collected/recorded due to the encounter was a telemedicine visit.   Location of patient [place]ex: home, work[place]   Patient consents to a telephone visit: Yes.  Location of the provider (ex: office, home): [employer].   Name of any referring provider: Ulonda Klosowski, Nelda Bucks, NP   Names of all persons participating in the telemedicine service and their role in the encounter: Patient, Catherine Snyder, [place], [place], Webb Silversmith, NP.    Time spent on call: 8 minutes spent on the phone with Medical Assistant.     Subjective:   Catherine Snyder is a 80 y.o. female who presents for Medicare Annual (Subsequent) preventive examination.  Review of Systems    Cardiac Risk Factors include: advanced age (>41men, >3 women);hypertension;dyslipidemia     Objective:    Today's Vitals   09/01/20 1230  PainSc: 5    There is no height or weight on file to calculate BMI.  Advanced Directives 09/01/2020 04/22/2020 07/17/2019 07/14/2018 07/04/2018 07/20/2017 06/13/2017  Does Patient Have a Medical Advance Directive? Yes Yes No Yes No Yes Yes  Type of Paramedic of [place];Living will [place];Living will - [place];Living will [place];Living will  Does patient want to make changes to medical advance directive? No - Patient declined No - Patient declined - No - Patient declined - - No - Patient declined  Copy of [place] in Chart? Yes - validated most recent copy scanned in chart (See row information) Yes - validated most recent copy scanned in chart (See row information) - No - copy requested - Yes Yes  Would patient like information on creating a medical advance directive? - - - No - Patient declined No - Patient declined - -    Current Medications (verified) Outpatient Encounter Medications as  of 09/01/2020  Medication Sig   acetaminophen (TYLENOL) 500 MG tablet Take 500 mg by mouth every 6 (six) hours as needed for moderate pain.   amLODipine (NORVASC) 10 MG tablet TAKE 1 TABLET BY MOUTH EVERY DAY   Ascorbic Acid (VITAMIN C) 1000 MG tablet Take 1,000 mg by mouth daily.   atorvastatin (LIPITOR) 80 MG tablet TAKE ONE TABLET BY MOUTH ONCE DAILY AT 6PM   Cholecalciferol (VITAMIN D) 2000 units tablet Take 2,000 Units by mouth daily.   diclofenac Sodium (VOLTAREN) 1 % GEL Apply 2 g topically as needed.   nitroGLYCERIN (NITROSTAT) 0.4 MG SL tablet Place 1 tablet (0.4 mg total) under the tongue every 5 (five) minutes x 3 doses as needed for chest pain.   oxymetazoline (AFRIN) 0.05 % nasal spray Place 1 spray into both nostrils 2 (two) times daily.   rOPINIRole (REQUIP) 0.5 MG tablet TAKE 1 TABLET (0.5 MG TOTAL) BY MOUTH AT BEDTIME.   XARELTO 20 MG TABS tablet TAKE ONE TABLET BY MOUTH ONCE DAILY WITH SUPPER   No facility-administered encounter medications on file as of 09/01/2020.    Allergies (verified) Patient has no known allergies.   History: Past Medical History:  Diagnosis Date   Absolute anemia    CAD in native artery    40% LAD at cath 02/04/16   Chronic anticoagulation 03/02/2016   Demand ischemia of myocardium - Not NSTEMI 02/03/2016   Dyslipidemia 03/02/2016   Dyspnea on exertion    Elevated troponin    Essential hypertension 11/27/2014   Frequency  of urination    Hematuria    History of bladder cancer    a. 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   RLS (restless legs syndrome) 05/21/2016   Saddle embolus of pulmonary artery without acute cor pulmonale    Acute saddle PE treated with direct thrombolytics 02/07/16   Wears glasses    Wears partial dentures     upper   Past Surgical History:  Procedure Laterality Date   CARDIAC CATHETERIZATION N/A 02/04/2016   Procedure: RIGHT/LEFT HEART CATH AND CORONARY ANGIOGRAPHY;  Surgeon: Troy Sine, MD;  Location: [place] [facility];  Service: Cardiovascular;  Laterality: N/A;   CYSTOSCOPY W/ RETROGRADES Bilateral 07/07/2015   Procedure: CYSTOSCOPY WITH RETROGRADE PYELOGRAM;  Surgeon: Cleon Gustin, MD;  Location: [facility];  Service: Urology;  Laterality: Bilateral;   IR GENERIC HISTORICAL  02/07/2016   IR ANGIOGRAM SELECTIVE EACH ADDITIONAL VESSEL 02/07/2016 Jacqulynn Cadet, MD [facility]   IR GENERIC HISTORICAL  02/07/2016   IR US GUIDE VASC ACCESS RIGHT 02/07/2016 Jacqulynn Cadet, MD [facility]   IR GENERIC HISTORICAL  02/07/2016   IR ANGIOGRAM PULMONARY BILATERAL SELECTIVE 02/07/2016 Jacqulynn Cadet, MD [facility]   IR GENERIC HISTORICAL  02/07/2016   IR INFUSION THROMBOL ARTERIAL INITIAL (MS) 02/07/2016 Jacqulynn Cadet, MD [facility]   IR GENERIC HISTORICAL  02/07/2016   IR INFUSION THROMBOL ARTERIAL INITIAL (MS) 02/07/2016 Jacqulynn Cadet, MD [facility]   IR GENERIC HISTORICAL  02/07/2016   IR ANGIOGRAM SELECTIVE EACH ADDITIONAL VESSEL 02/07/2016 Jacqulynn Cadet, MD [facility]   NO PAST SURGERIES     TRANSURETHRAL RESECTION OF BLADDER TUMOR WITH GYRUS (TURBT-GYRUS) N/A 07/07/2015   Procedure: TRANSURETHRAL RESECTION OF BLADDER TUMOR WITH GYRUS (TURBT-GYRUS);  Surgeon: Cleon Gustin, MD;  Location: [facility];  Service: Urology;  Laterality: N/A;   Family History  Problem Relation Age of Onset   CAD Mother 64   Diabetes Mother    Seizures Mother    Heart disease Father 88   Seizures Brother    CAD Brother 41   Social History   Socioeconomic History   Marital status: Single    Spouse name: Not on file   Number of children: Not on file   Years of education: 12   Highest education level: High school graduate  Occupational  History   Occupation: Retired    Comment: Dietician  Tobacco Use   Smoking status: Never   Smokeless tobacco: Never  Scientific laboratory technician Use: Never used  Substance and Sexual Activity   Alcohol use: No   Drug use: No   Sexual activity: Not Currently    Comment: 1st intercourse- 20's, partners- 30, divorced  Other Topics Concern   Not on file  Social History Narrative    Diet:   Do you drink/eat things with caffeine? Yes coffee   Marital status: Divorced                              What year were you married?    Do you live in a house, apartment, assisted living, condo, trailer, etc)? House   Is it one or more stories? 1 story   How many persons live in your home? 4  Do you have any pets in your home? 0   Current or past profession: Dietitian   Do you exercise? Yes                                       Type & how often:  Walk daily   Do you have a living will? No   Do you have a DNR Form? No   Do you have a POA/HPOA forms? No   Social Determinants of Radio broadcast assistant Strain: Not on file  Food Insecurity: Not on file  Transportation Needs: Not on file  Physical Activity: Not on file  Stress: Not on file  Social Connections: Not on file    Tobacco Counseling Counseling given: Not Answered   Clinical Intake:  Pre-visit preparation completed: No  Pain : 0-10 Pain Score: 5  Pain Type: Chronic pain Pain Location: Foot Pain Orientation: Left Pain Radiating Towards: No Pain Descriptors / Indicators: Sharp Pain Onset:  (several months) Pain Frequency: Intermittent Pain Relieving Factors: OTC creams,tylenol Effect of Pain on Daily Activities: No  Pain Relieving Factors: OTC creams,tylenol  BMI - recorded: 29.8 Nutritional Status: BMI 25 -29 Overweight Nutritional Risks: None Diabetes: No  How often do you need to have someone help you when you read instructions, pamphlets, or other written materials from your doctor or pharmacy?: 5 - Always (Daughter  assist) What is the last grade level you completed in school?: 12 grade  Diabetic?No   Interpreter Needed?: No  Information entered by :: Jionni Helming,FNP-C   Activities of Daily Living In your present state of health, do you have any difficulty performing the following activities: 09/01/2020  Hearing? N  Vision? N  Comment s/p cataract surgery  Difficulty concentrating or making decisions? N  Walking or climbing stairs? N  Dressing or bathing? N  Doing errands, shopping? N  Comment Family assist Copywriter, advertising and eating ? N  Using the Toilet? N  In the past six months, have you accidently leaked urine? N  Do you have problems with loss of bowel control? N  Managing your Medications? N  Managing your Finances? N  Housekeeping or managing your Housekeeping? Y  Comment Daughter assist  Some recent data might be hidden    Patient Care Team: Anitha Kreiser, Nelda Bucks, NP as PCP - General (Family Medicine)  Indicate any recent Medical Services you may have received from other than [facility] providers in the past year (date may be approximate).     Assessment:   This is a routine wellness examination for [place].  Hearing/Vision screen Hearing Screening - Comments:: No Hearing Concerns. Patient doesn't wear hearing aids.  Vision Screening - Comments:: No Vision Concerns. Patient wears prescription glasses. Patient last eye exam was June 2022.  Dietary issues and exercise activities discussed: Current Exercise Habits: Home exercise routine, Type of exercise: stretching, Time (Minutes): 10, Frequency (Times/Week): 7, Weekly Exercise (Minutes/Week): 70, Intensity: Mild, Exercise limited by: None identified   Goals Addressed             This Visit's Progress    <enter goal here>   On track    Starting 05/07/16, I will maintain my current lifestyle.         Depression Screen PHQ 2/9 Scores 09/01/2020 07/17/2019 09/07/2018 07/14/2018 07/04/2018 06/13/2017 05/21/2016  PHQ - 2  Score 0 0 0 0 0 0 0  PHQ- 9 Score - - - - - - 0    Fall Risk Fall Risk  09/01/2020 04/22/2020 07/17/2019 03/19/2019 03/05/2019  Falls in the past year? 0 0 0 0 0  Number falls in past yr: 0 0 0 0 0  Injury with Fall? 0 0 0 0 0  Risk for fall due to : No Fall Risks - - - -    FALL RISK PREVENTION PERTAINING TO THE HOME:  Any stairs in or around the home? Yes  If so, are there any without handrails? Yes  Home free of loose throw rugs in walkways, pet beds, electrical cords, etc? No  Adequate lighting in your home to reduce risk of falls? Yes   ASSISTIVE DEVICES UTILIZED TO PREVENT FALLS:  Life alert? No  Use of a cane, walker or w/c? No  Grab bars in the bathroom? No  Shower chair or bench in shower? No  Elevated toilet seat or a handicapped toilet? Yes   TIMED UP AND GO:  Was the test performed? No .  Length of time to ambulate 10 feet: N/A sec.   Gait slow and steady without use of assistive device  Cognitive Function: MMSE - Mini Mental State Exam 06/13/2017 05/07/2016 05/07/2015  Not completed: - - (No Data)  Orientation to time 5 5 4   Orientation to Place 3 4 4   Registration 3 3 3   Attention/ Calculation 5 5 5   Recall 1 3 2   Language- name 2 objects 2 2 2   Language- repeat 1 1 0  Language- follow 3 step command 3 3 3   Language- read & follow direction 1 1 1   Write a sentence 1 0 1  Copy design 1 1 0  Total score 26 28 25      6CIT Screen 07/17/2019 07/14/2018  What Year? 0 points 0 points  What month? 0 points 0 points  What time? 0 points 0 points  Count back from 20 0 points 0 points  Months in reverse - 0 points  Repeat phrase 2 points 0 points  Total Score - 0    Immunizations Immunization History  Administered Date(s) Administered   Influenza,inj,Quad PF,6+ Mos 11/27/2014   Pneumococcal Conjugate-13 06/13/2017    TDAP status: Due, Education has been provided regarding the importance of this vaccine. Advised may receive this vaccine at local pharmacy or  Health Dept. Aware to provide a copy of the vaccination record if obtained from local pharmacy or Health Dept. Verbalized acceptance and understanding.  Flu Vaccine status: Up to date  Pneumococcal vaccine status: Due, Education has been provided regarding the importance of this vaccine. Advised may receive this vaccine at local pharmacy or Health Dept. Aware to provide a copy of the vaccination record if obtained from local pharmacy or Health Dept. Verbalized acceptance and understanding.  Covid-19 vaccine status: Declined, Education has been provided regarding the importance of this vaccine but patient still declined. Advised may receive this vaccine at local pharmacy or Health Dept.or vaccine clinic. Aware to provide a copy of the vaccination record if obtained from local pharmacy or Health Dept. Verbalized acceptance and understanding.  Qualifies for Shingles Vaccine? Yes   Zostavax completed No   Shingrix Completed?: No.    Education has been provided regarding the importance of this vaccine. Patient has been advised to call insurance company to determine out of pocket expense if they have not yet received this vaccine. Advised may also receive vaccine at local pharmacy or Health Dept. Verbalized acceptance  and understanding.  Screening Tests Health Maintenance  Topic Date Due   COVID-19 Vaccine (1) Never done   Zoster Vaccines- Shingrix (1 of 2) Never done   TETANUS/TDAP  04/22/2021 (Originally 01/07/1960)   PNA vac Low Risk Adult (2 of 2 - PPSV23) 04/22/2021 (Originally 06/14/2018)   INFLUENZA VACCINE  10/13/2020   MAMMOGRAM  12/14/2020   DEXA SCAN  Completed   Hepatitis C Screening  Completed   HPV VACCINES  Aged Out    Health Maintenance  Health Maintenance Due  Topic Date Due   COVID-19 Vaccine (1) Never done   Zoster Vaccines- Shingrix (1 of 2) Never done    Colorectal cancer screening: No longer required.   Mammogram status: Ordered 09/18/2020. Pt provided with contact  info and advised to call to schedule appt.   Bone Density status: Ordered 09/18/2020. Pt provided with contact info and advised to call to schedule appt.  Lung Cancer Screening: (Low Dose CT Chest recommended if Age 30-80 years, 30 pack-year currently smoking OR have quit w/in 15years.) does not qualify.   Lung Cancer Screening Referral: No   Additional Screening:  Hepatitis C Screening: does not qualify; Completed yes   Vision Screening: Recommended annual ophthalmology exams for early detection of glaucoma and other disorders of the eye. Is the patient up to date with their annual eye exam?  Yes  Who is the provider or what is the name of the office in which the patient attends annual eye exams? Dr. Katy Fitch  If pt is not established with a provider, would they like to be referred to a provider to establish care? No .   Dental Screening: Recommended annual dental exams for proper oral hygiene  Community Resource Referral / Chronic Care Management: CRR required this visit?  No   CCM required this visit?  No      Plan:    - Declined Shingrix,COVID-19 vaccine   I have personally reviewed and noted the following in the patient's chart:   Medical and social history Use of alcohol, tobacco or illicit drugs  Current medications and supplements including opioid prescriptions.  Functional ability and status Nutritional status Physical activity Advanced directives List of other physicians Hospitalizations, surgeries, and ER visits in previous 12 months Vitals Screenings to include cognitive, depression, and falls Referrals and appointments  In addition, I have reviewed and discussed with patient certain preventive protocols, quality metrics, and best practice recommendations. A written personalized care plan for preventive services as well as general preventive health recommendations were provided to patient.     Sandrea Hughs, NP   09/01/2020   Nurse Notes:Has upcoming  appointment for Bone density and mammogram 09/18/2020.

## 2020-09-01 NOTE — Patient Instructions (Signed)
Catherine Snyder, Thank you for taking time to come for your Medicare Wellness Visit. I appreciate your ongoing commitment to your health goals. Please review the following plan we discussed and let me know if I can assist you in the future.   Screening recommendations/referrals: Colonoscopy N/A Mammogram: appointment coming up 09/18/2020  Bone Density appointment coming up 09/18/2020  Recommended yearly ophthalmology/optometry visit for glaucoma screening and checkup Recommended yearly dental visit for hygiene and checkup  Vaccinations: Influenza vaccine up to date  Pneumococcal vaccine : Due  Tdap vaccine Due  Shingles vaccine Due   Advanced directives:Yes   Conditions/risks identified:Advance age female > 79 yrs,Hypertension,Dyslipidemia   Next appointment: 1 year    Preventive Care 79 Years and Older, Female Preventive care refers to lifestyle choices and visits with your health care provider that can promote health and wellness. What does preventive care include? A yearly physical exam. This is also called an annual well check. Dental exams once or twice a year. Routine eye exams. Ask your health care provider how often you should have your eyes checked. Personal lifestyle choices, including: Daily care of your teeth and gums. Regular physical activity. Eating a healthy diet. Avoiding tobacco and drug use. Limiting alcohol use. Practicing safe sex. Taking low-dose aspirin every day. Taking vitamin and mineral supplements as recommended by your health care provider. What happens during an annual well check? The services and screenings done by your health care provider during your annual well check will depend on your age, overall health, lifestyle risk factors, and family history of disease. Counseling  Your health care provider may ask you questions about your: Alcohol use. Tobacco use. Drug use. Emotional well-being. Home and relationship well-being. Sexual activity. Eating  habits. History of falls. Memory and ability to understand (cognition). Work and work Statistician. Reproductive health. Screening  You may have the following tests or measurements: Height, weight, and BMI. Blood pressure. Lipid and cholesterol levels. These may be checked every 5 years, or more frequently if you are over 28 years old. Skin check. Lung cancer screening. You may have this screening every year starting at age 79 if you have a 30-pack-year history of smoking and currently smoke or have quit within the past 15 years. Fecal occult blood test (FOBT) of the stool. You may have this test every year starting at age 79. Flexible sigmoidoscopy or colonoscopy. You may have a sigmoidoscopy every 5 years or a colonoscopy every 10 years starting at age 16. Hepatitis C blood test. Hepatitis B blood test. Sexually transmitted disease (STD) testing. Diabetes screening. This is done by checking your blood sugar (glucose) after you have not eaten for a while (fasting). You may have this done every 1-3 years. Bone density scan. This is done to screen for osteoporosis. You may have this done starting at age 79. Mammogram. This may be done every 1-2 years. Talk to your health care provider about how often you should have regular mammograms. Talk with your health care provider about your test results, treatment options, and if necessary, the need for more tests. Vaccines  Your health care provider may recommend certain vaccines, such as: Influenza vaccine. This is recommended every year. Tetanus, diphtheria, and acellular pertussis (Tdap, Td) vaccine. You may need a Td booster every 10 years. Zoster vaccine. You may need this after age 40. Pneumococcal 13-valent conjugate (PCV13) vaccine. One dose is recommended after age 79. Pneumococcal polysaccharide (PPSV23) vaccine. One dose is recommended after age 79. Talk to your health care  provider about which screenings and vaccines you need and how  often you need them. This information is not intended to replace advice given to you by your health care provider. Make sure you discuss any questions you have with your health care provider. Document Released: 03/28/2015 Document Revised: 11/19/2015 Document Reviewed: 12/31/2014 Elsevier Interactive Patient Education  2017 [employer] Prevention in the Home Falls can cause injuries. They can happen to people of all ages. There are many things you can do to make your home safe and to help prevent falls. What can I do on the outside of my home? Regularly fix the edges of walkways and driveways and fix any cracks. Remove anything that might make you trip as you walk through a door, such as a raised step or threshold. Trim any bushes or trees on the path to your home. Use bright outdoor lighting. Clear any walking paths of anything that might make someone trip, such as rocks or tools. Regularly check to see if handrails are loose or broken. Make sure that both sides of any steps have handrails. Any raised decks and porches should have guardrails on the edges. Have any leaves, snow, or ice cleared regularly. Use sand or salt on walking paths during winter. Clean up any spills in your garage right away. This includes oil or grease spills. What can I do in the bathroom? Use night lights. Install grab bars by the toilet and in the tub and shower. Do not use towel bars as grab bars. Use non-skid mats or decals in the tub or shower. If you need to sit down in the shower, use a plastic, non-slip stool. Keep the floor dry. Clean up any water that spills on the floor as soon as it happens. Remove soap buildup in the tub or shower regularly. Attach bath mats securely with double-sided non-slip rug tape. Do not have throw rugs and other things on the floor that can make you trip. What can I do in the bedroom? Use night lights. Make sure that you have a light by your bed that is easy to  reach. Do not use any sheets or blankets that are too big for your bed. They should not hang down onto the floor. Have a firm chair that has side arms. You can use this for support while you get dressed. Do not have throw rugs and other things on the floor that can make you trip. What can I do in the kitchen? Clean up any spills right away. Avoid walking on wet floors. Keep items that you use a lot in easy-to-reach places. If you need to reach something above you, use a strong step stool that has a grab bar. Keep electrical cords out of the way. Do not use floor polish or wax that makes floors slippery. If you must use wax, use non-skid floor wax. Do not have throw rugs and other things on the floor that can make you trip. What can I do with my stairs? Do not leave any items on the stairs. Make sure that there are handrails on both sides of the stairs and use them. Fix handrails that are broken or loose. Make sure that handrails are as long as the stairways. Check any carpeting to make sure that it is firmly attached to the stairs. Fix any carpet that is loose or worn. Avoid having throw rugs at the top or bottom of the stairs. If you do have throw rugs, attach them to the  floor with carpet tape. Make sure that you have a light switch at the top of the stairs and the bottom of the stairs. If you do not have them, ask someone to add them for you. What else can I do to help prevent falls? Wear shoes that: Do not have high heels. Have rubber bottoms. Are comfortable and fit you well. Are closed at the toe. Do not wear sandals. If you use a stepladder: Make sure that it is fully opened. Do not climb a closed stepladder. Make sure that both sides of the stepladder are locked into place. Ask someone to hold it for you, if possible. Clearly mark and make sure that you can see: Any grab bars or handrails. First and last steps. Where the edge of each step is. Use tools that help you move  around (mobility aids) if they are needed. These include: Canes. Walkers. Scooters. Crutches. Turn on the lights when you go into a dark area. Replace any light bulbs as soon as they burn out. Set up your furniture so you have a clear path. Avoid moving your furniture around. If any of your floors are uneven, fix them. If there are any pets around you, be aware of where they are. Review your medicines with your doctor. Some medicines can make you feel dizzy. This can increase your chance of falling. Ask your doctor what other things that you can do to help prevent falls. This information is not intended to replace advice given to you by your health care provider. Make sure you discuss any questions you have with your health care provider. Document Released: 12/26/2008 Document Revised: 08/07/2015 Document Reviewed: 04/05/2014 Elsevier Interactive Patient Education  2017 [employer].

## 2020-09-18 ENCOUNTER — Inpatient Hospital Stay: Admission: RE | Admit: 2020-09-18 | Payer: PPO | Source: Ambulatory Visit

## 2020-09-18 ENCOUNTER — Other Ambulatory Visit: Payer: PPO

## 2020-09-19 ENCOUNTER — Ambulatory Visit: Payer: PPO | Admitting: Neurology

## 2020-10-15 ENCOUNTER — Other Ambulatory Visit: Payer: Self-pay

## 2020-10-15 DIAGNOSIS — R739 Hyperglycemia, unspecified: Secondary | ICD-10-CM

## 2020-10-15 DIAGNOSIS — E782 Mixed hyperlipidemia: Secondary | ICD-10-CM

## 2020-10-15 DIAGNOSIS — I10 Essential (primary) hypertension: Secondary | ICD-10-CM

## 2020-10-15 DIAGNOSIS — I2699 Other pulmonary embolism without acute cor pulmonale: Secondary | ICD-10-CM

## 2020-10-15 NOTE — Progress Notes (Signed)
Catherine Snyder lab tech came to ask about patient labs for tomorrow 10/16/2020. She states that patient has no labs. Patient CBC, CMP, LIPID Panel, TSH, and Hemoglobin A1C added as Future orders for tomorrow blood work 10/16/2020. Patient Hemoglobin A1C wasn't completed as add on lab based off documentation from February 2022. Message routed to PCP Ngetich, Nelda Bucks, NP .

## 2020-10-16 ENCOUNTER — Other Ambulatory Visit: Payer: Self-pay

## 2020-10-16 ENCOUNTER — Other Ambulatory Visit: Payer: PPO

## 2020-10-16 DIAGNOSIS — I10 Essential (primary) hypertension: Secondary | ICD-10-CM

## 2020-10-16 DIAGNOSIS — E782 Mixed hyperlipidemia: Secondary | ICD-10-CM

## 2020-10-16 DIAGNOSIS — R739 Hyperglycemia, unspecified: Secondary | ICD-10-CM

## 2020-10-16 DIAGNOSIS — T45515A Adverse effect of anticoagulants, initial encounter: Secondary | ICD-10-CM

## 2020-10-16 DIAGNOSIS — Z7901 Long term (current) use of anticoagulants: Secondary | ICD-10-CM

## 2020-10-16 DIAGNOSIS — I2699 Other pulmonary embolism without acute cor pulmonale: Secondary | ICD-10-CM | POA: Diagnosis not present

## 2020-10-17 LAB — CBC WITH DIFFERENTIAL/PLATELET
Absolute Monocytes: 484 cells/uL (ref 200–950)
Basophils Absolute: 32 cells/uL (ref 0–200)
Basophils Relative: 0.8 %
Eosinophils Absolute: 72 cells/uL (ref 15–500)
Eosinophils Relative: 1.8 %
HCT: 37.3 % (ref 35.0–45.0)
Hemoglobin: 11.9 g/dL (ref 11.7–15.5)
Lymphs Abs: 1308 cells/uL (ref 850–3900)
MCH: 28.5 pg (ref 27.0–33.0)
MCHC: 31.9 g/dL — ABNORMAL LOW (ref 32.0–36.0)
MCV: 89.2 fL (ref 80.0–100.0)
MPV: 10.3 fL (ref 7.5–12.5)
Monocytes Relative: 12.1 %
Neutro Abs: 2104 cells/uL (ref 1500–7800)
Neutrophils Relative %: 52.6 %
Platelets: 249 10*3/uL (ref 140–400)
RBC: 4.18 10*6/uL (ref 3.80–5.10)
RDW: 13.7 % (ref 11.0–15.0)
Total Lymphocyte: 32.7 %
WBC: 4 10*3/uL (ref 3.8–10.8)

## 2020-10-17 LAB — LIPID PANEL
Cholesterol: 103 mg/dL (ref <200)
HDL: 43 mg/dL — ABNORMAL LOW (ref >=50)
LDL Cholesterol (Calc): 47 mg/dL (calc)
Non-HDL Cholesterol (Calc): 60 mg/dL (calc) (ref <130)
Total CHOL/HDL Ratio: 2.4 (calc) (ref <5.0)
Triglycerides: 57 mg/dL (ref <150)

## 2020-10-17 LAB — COMPLETE METABOLIC PANEL WITH GFR
AG Ratio: 1.2 (calc) (ref 1.0–2.5)
ALT: 16 U/L (ref 6–29)
AST: 23 U/L (ref 10–35)
Albumin: 4.1 g/dL (ref 3.6–5.1)
Alkaline phosphatase (APISO): 111 U/L (ref 37–153)
BUN: 12 mg/dL (ref 7–25)
CO2: 28 mmol/L (ref 20–32)
Calcium: 9.2 mg/dL (ref 8.6–10.4)
Chloride: 106 mmol/L (ref 98–110)
Creat: 0.73 mg/dL (ref 0.60–1.00)
Globulin: 3.4 g/dL (calc) (ref 1.9–3.7)
Glucose, Bld: 126 mg/dL — ABNORMAL HIGH (ref 65–99)
Potassium: 3.7 mmol/L (ref 3.5–5.3)
Sodium: 142 mmol/L (ref 135–146)
Total Bilirubin: 0.5 mg/dL (ref 0.2–1.2)
Total Protein: 7.5 g/dL (ref 6.1–8.1)
eGFR: 84 mL/min/{1.73_m2} (ref >=60)

## 2020-10-17 LAB — HEMOGLOBIN A1C
Hgb A1c MFr Bld: 7.2 % of total Hgb — ABNORMAL HIGH (ref <5.7)
Mean Plasma Glucose: 160 mg/dL
eAG (mmol/L): 8.9 mmol/L

## 2020-10-17 LAB — TSH: TSH: 1.9 mIU/L (ref 0.40–4.50)

## 2020-10-20 ENCOUNTER — Ambulatory Visit (INDEPENDENT_AMBULATORY_CARE_PROVIDER_SITE_OTHER): Payer: PPO | Admitting: Family

## 2020-10-20 ENCOUNTER — Other Ambulatory Visit: Payer: Self-pay

## 2020-10-20 ENCOUNTER — Encounter: Payer: Self-pay | Admitting: Family

## 2020-10-20 ENCOUNTER — Other Ambulatory Visit: Payer: Self-pay | Admitting: Family

## 2020-10-20 VITALS — BP 140/88 | HR 63 | Temp 97.3°F | Resp 16 | Ht 63.0 in | Wt 166.8 lb

## 2020-10-20 DIAGNOSIS — G2581 Restless legs syndrome: Secondary | ICD-10-CM | POA: Diagnosis not present

## 2020-10-20 DIAGNOSIS — I10 Essential (primary) hypertension: Secondary | ICD-10-CM

## 2020-10-20 DIAGNOSIS — E119 Type 2 diabetes mellitus without complications: Secondary | ICD-10-CM | POA: Diagnosis not present

## 2020-10-20 DIAGNOSIS — I2699 Other pulmonary embolism without acute cor pulmonale: Secondary | ICD-10-CM | POA: Diagnosis not present

## 2020-10-20 DIAGNOSIS — T45515A Adverse effect of anticoagulants, initial encounter: Secondary | ICD-10-CM

## 2020-10-20 DIAGNOSIS — G8929 Other chronic pain: Secondary | ICD-10-CM | POA: Diagnosis not present

## 2020-10-20 DIAGNOSIS — M25561 Pain in right knee: Secondary | ICD-10-CM

## 2020-10-20 DIAGNOSIS — Z23 Encounter for immunization: Secondary | ICD-10-CM | POA: Diagnosis not present

## 2020-10-20 DIAGNOSIS — E782 Mixed hyperlipidemia: Secondary | ICD-10-CM

## 2020-10-20 DIAGNOSIS — Z7901 Long term (current) use of anticoagulants: Secondary | ICD-10-CM

## 2020-10-20 MED ORDER — ZOSTER VAC RECOMB ADJUVANTED 50 MCG/0.5ML IM SUSR: 0.5000 mL | Freq: Once | INTRAMUSCULAR | 0 refills | Status: AC

## 2020-10-20 NOTE — Progress Notes (Signed)
Provider: Marlowe Sax FNP-C   Kamia Insalaco, Nelda Bucks, NP  Patient Care Team: Kierre Hintz, Nelda Bucks, NP as PCP - General (Family Medicine)  Extended Emergency Contact Information Primary Emergency Contact: Tinoco,Jeanette Address: [address]           [place], [place] [address] [place] Phone: [phone] Mobile Phone: [phone] Relation: Daughter Secondary Emergency Contact: Chiquita Loth [place] Phone: [phone] Mobile Phone: [phone] Relation: Son  Code Status: DNR  Goals of care: Advanced Directive information Advanced Directives 10/17/2020  Does Patient Have a Medical Advance Directive? Yes  Type of Paramedic of [place];Living will;Out of facility DNR (pink MOST or yellow form)  Does patient want to make changes to medical advance directive? No - Patient declined  Copy of [place] in Chart? Yes - validated most recent copy scanned in chart (See row information)  Would patient like information on creating a medical advance directive? -     Chief Complaint  Patient presents with   Medical Management of Chronic Issues    6 month follow up   Immunizations    Discuss the need for influenza vaccine, and shingrix vaccine.     HPI:  Pt is a 80 y.o. female seen today for medical management of chronic diseases.    Has to use a ACE band on right knee keeps her awake at night.sometimes loss balance due to knee pain.Has gotten better with exercise.   Left lower back pain - uses OTC patch which eases off the pain.  No Home blood pressure readings.denies any headache,dizziness,vision changes,fatigue,chest tightness,palpitation,chest pain or shortness of breath.    No more rectal bleeding.   Hgb 7.2 Glucose 126 eats pastries and bread.sometimes drinks diet soda.walks every day.  Cholesterol ,TRG LDL are all normal.HDL   Past Medical History:  Diagnosis Date   Absolute anemia     CAD in native artery    40% LAD at cath 02/04/16   Chronic anticoagulation 03/02/2016   Demand ischemia of myocardium - Not NSTEMI 02/03/2016   Dyslipidemia 03/02/2016   Dyspnea on exertion    Elevated troponin    Essential hypertension 11/27/2014   Frequency of urination    Hematuria    History of bladder cancer    a. 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   RLS (restless legs syndrome) 05/21/2016   Saddle embolus of pulmonary artery without acute cor pulmonale    Acute saddle PE treated with direct thrombolytics 02/07/16   Wears glasses    Wears partial dentures    upper   Past Surgical History:  Procedure Laterality Date   CARDIAC CATHETERIZATION N/A 02/04/2016   Procedure: RIGHT/LEFT HEART CATH AND CORONARY ANGIOGRAPHY;  Surgeon: Troy Sine, MD;  Location: [facility];  Service: Cardiovascular;  Laterality: N/A;   CYSTOSCOPY W/ RETROGRADES Bilateral 07/07/2015   Procedure: CYSTOSCOPY WITH RETROGRADE PYELOGRAM;  Surgeon: Cleon Gustin, MD;  Location: [facility];  Service: Urology;  Laterality: Bilateral;   IR GENERIC HISTORICAL  02/07/2016   IR ANGIOGRAM SELECTIVE EACH ADDITIONAL VESSEL 02/07/2016 Jacqulynn Cadet, MD [facility]   IR GENERIC HISTORICAL  02/07/2016   IR US GUIDE VASC ACCESS RIGHT 02/07/2016 Jacqulynn Cadet, MD [facility]   IR  GENERIC HISTORICAL  02/07/2016   IR ANGIOGRAM PULMONARY BILATERAL SELECTIVE 02/07/2016 Jacqulynn Cadet, MD [facility]   IR GENERIC HISTORICAL  02/07/2016   IR INFUSION THROMBOL ARTERIAL INITIAL (MS) 02/07/2016 Jacqulynn Cadet, MD [facility]   IR GENERIC HISTORICAL  02/07/2016   IR INFUSION THROMBOL ARTERIAL INITIAL (MS) 02/07/2016 Jacqulynn Cadet, MD [facility]   IR GENERIC HISTORICAL   02/07/2016   IR ANGIOGRAM SELECTIVE EACH ADDITIONAL VESSEL 02/07/2016 Jacqulynn Cadet, MD [facility]   NO PAST SURGERIES     TRANSURETHRAL RESECTION OF BLADDER TUMOR WITH GYRUS (TURBT-GYRUS) N/A 07/07/2015   Procedure: TRANSURETHRAL RESECTION OF BLADDER TUMOR WITH GYRUS (TURBT-GYRUS);  Surgeon: Cleon Gustin, MD;  Location: [facility];  Service: Urology;  Laterality: N/A;    No Known Allergies  Allergies as of 10/20/2020   No Known Allergies      Medication List        Accurate as of October 20, 2020  8:15 PM. If you have any questions, ask your nurse or doctor.          acetaminophen 500 MG tablet Commonly known as: TYLENOL Take 500 mg by mouth every 6 (six) hours as needed for moderate pain.   amLODipine 10 MG tablet Commonly known as: NORVASC TAKE 1 TABLET BY MOUTH EVERY DAY   atorvastatin 80 MG tablet Commonly known as: LIPITOR TAKE ONE TABLET BY MOUTH ONCE DAILY AT 6PM   diclofenac Sodium 1 % Gel Commonly known as: VOLTAREN Apply 2 g topically as needed.   nitroGLYCERIN 0.4 MG SL tablet Commonly known as: NITROSTAT Place 1 tablet (0.4 mg total) under the tongue every 5 (five) minutes x 3 doses as needed for chest pain.   oxymetazoline 0.05 % nasal spray Commonly known as: AFRIN Place 1 spray into both nostrils 2 (two) times daily.   rOPINIRole 0.5 MG tablet Commonly known as: REQUIP TAKE 1 TABLET (0.5 MG TOTAL) BY MOUTH AT BEDTIME.   vitamin C 1000 MG tablet Take 1,000 mg by mouth daily.   Vitamin D 50 MCG (2000 UT) tablet Take 2,000 Units by mouth daily.   Xarelto 20 MG Tabs tablet Generic drug: rivaroxaban TAKE ONE TABLET BY MOUTH ONCE DAILY WITH SUPPER   Zoster Vaccine Adjuvanted injection Commonly known as: SHINGRIX Inject 0.5 mLs into the muscle once for 1 dose.        Review of Systems  Constitutional:  Negative for appetite change, chills, fatigue, fever and unexpected weight change.  HENT:  Negative for  congestion, dental problem, ear discharge, ear pain, facial swelling, hearing loss, nosebleeds, postnasal drip, rhinorrhea, sinus pressure, sinus pain, sneezing, sore throat, tinnitus and trouble swallowing.   Eyes:  Negative for pain, discharge, redness, itching and visual disturbance.  Respiratory:  Negative for cough, chest tightness, shortness of breath and wheezing.   Cardiovascular:  Negative for chest pain, palpitations and leg swelling.  Gastrointestinal:  Negative for abdominal distention, abdominal pain, blood in stool, constipation, diarrhea, nausea and vomiting.  Endocrine: Negative for cold intolerance, heat intolerance, polydipsia, polyphagia and polyuria.  Genitourinary:  Negative for difficulty urinating, dysuria, flank pain, frequency and urgency.  Musculoskeletal:  Positive for arthralgias and back pain. Negative for gait problem, joint swelling, myalgias, neck pain and neck stiffness.       Right knee pain   Skin:  Negative for color change, pallor, rash and wound.  Neurological:  Negative for dizziness, syncope, speech difficulty, weakness, light-headedness, numbness and headaches.  Hematological:  Does not bruise/bleed  easily.  Psychiatric/Behavioral:  Negative for agitation, behavioral problems, confusion, hallucinations, self-injury, sleep disturbance and suicidal ideas. The patient is not nervous/anxious.    Immunization History  Administered Date(s) Administered   Influenza,inj,Quad PF,6+ Mos 11/27/2014   Pneumococcal Conjugate-13 06/13/2017   Pertinent  Health Maintenance Due  Topic Date Due   URINE MICROALBUMIN  Never done   INFLUENZA VACCINE  10/13/2020   PNA vac Low Risk Adult (2 of 2 - PPSV23) 04/22/2021 (Originally 06/14/2018)   MAMMOGRAM  12/14/2020   DEXA SCAN  Completed   Fall Risk  10/17/2020 09/01/2020 04/22/2020 07/17/2019 03/19/2019  Falls in the past year? 0 0 0 0 0  Number falls in past yr: 0 0 0 0 0  Injury with Fall? 0 0 0 0 0  Risk for fall due to : No  Fall Risks No Fall Risks - - -  Follow up Falls evaluation completed - - - -   Functional Status Survey:    Vitals:   10/20/20 1142  BP: 140/88  Pulse: 63  Resp: 16  Temp: (!) 97.3 F (36.3 C)  SpO2: 98%  Weight: 166 lb 12.8 oz (75.7 kg)  Height: _0  (1.6 m)   Body mass index is 29.55 kg/m. Physical Exam Vitals reviewed.  Constitutional:      General: She is not in acute distress.    Appearance: Normal appearance. She is overweight. She is not ill-appearing or diaphoretic.  HENT:     Head: Normocephalic.     Right Ear: Tympanic membrane, ear canal and external ear normal. There is no impacted cerumen.     Left Ear: Tympanic membrane, ear canal and external ear normal. There is no impacted cerumen.     Nose: Nose normal. No congestion or rhinorrhea.     Mouth/Throat:     Mouth: Mucous membranes are moist.     Pharynx: Oropharynx is clear. No oropharyngeal exudate or posterior oropharyngeal erythema.  Eyes:     General: No scleral icterus.       Right eye: No discharge.        Left eye: No discharge.     Extraocular Movements: Extraocular movements intact.     Conjunctiva/sclera: Conjunctivae normal.     Pupils: Pupils are equal, round, and reactive to light.  Neck:     Vascular: No carotid bruit.  Cardiovascular:     Rate and Rhythm: Normal rate and regular rhythm.     Pulses: Normal pulses.     Heart sounds: Normal heart sounds. No murmur heard.   No friction rub. No gallop.  Pulmonary:     Effort: Pulmonary effort is normal. No respiratory distress.     Breath sounds: Normal breath sounds. No wheezing, rhonchi or rales.  Chest:     Chest wall: No tenderness.  Abdominal:     General: Bowel sounds are normal. There is no distension.     Palpations: Abdomen is soft. There is no mass.     Tenderness: There is no abdominal tenderness. There is no right CVA tenderness, left CVA tenderness, guarding or rebound.  Musculoskeletal:        General: No swelling or  tenderness. Normal range of motion.     Cervical back: Normal range of motion. No rigidity or tenderness.     Right knee: Crepitus present. No swelling, effusion or erythema. Normal range of motion. No tenderness.     Left knee: Normal.     Right lower leg: No edema.  Left lower leg: No edema.  Lymphadenopathy:     Cervical: No cervical adenopathy.  Skin:    General: Skin is warm and dry.     Coloration: Skin is not pale.     Findings: No bruising, erythema, lesion or rash.  Neurological:     Mental Status: She is alert and oriented to person, place, and time.     Cranial Nerves: No cranial nerve deficit.     Sensory: No sensory deficit.     Motor: No weakness.     Coordination: Coordination normal.     Gait: Gait normal.  Psychiatric:        Mood and Affect: Mood normal.        Speech: Speech normal.        Behavior: Behavior normal.        Thought Content: Thought content normal.        Judgment: Judgment normal.    Labs reviewed: Recent Labs    04/22/20 1056 10/16/20 0946  NA 141 142  K 4.0 3.7  CL 107 106  CO2 27 28  GLUCOSE 127* 126*  BUN 17 12  CREATININE 0.52* 0.73  CALCIUM 9.4 9.2   Recent Labs    04/22/20 1056 10/16/20 0946  AST 22 23  ALT 18 16  BILITOT 0.4 0.5  PROT 7.6 7.5   Recent Labs    04/22/20 1056 10/16/20 0946  WBC 3.7* 4.0  NEUTROABS 1,976 2,104  HGB 12.8 11.9  HCT 38.3 37.3  MCV 91.2 89.2  PLT 269 249   Lab Results  Component Value Date   TSH 1.90 10/16/2020   Lab Results  Component Value Date   HGBA1C 7.2 (H) 10/16/2020   Lab Results  Component Value Date   CHOL 103 10/16/2020   HDL 43 (L) 10/16/2020   LDLCALC 47 10/16/2020   TRIG 57 10/16/2020   CHOLHDL 2.4 10/16/2020    Significant Diagnostic Results in last 30 days:  No results found.  Assessment/Plan 1. Need for shingles vaccine Advised to get shingrix vaccine at her pharmacy  - Zoster Vaccine Adjuvanted [facility]) injection; Inject 0.5 mLs into the  muscle once for 1 dose.  Dispense: 0.5 mL; Refill: 0  2. Essential hypertension B/p stable  - continue on amlodipine  - on Statin  - CBC with Differential/Platelet; Future - BMP with eGFR([facility]); Future - TSH; Future  3. Type 2 diabetes mellitus without complication, without long-term current use of insulin (HCC) Lab Results  Component Value Date   HGBA1C 7.2 (H) 10/16/2020  New onset  Dietary modification and exercise then recheck A1 C in 4 months.admits to eating bread and pastries but willing to cut down. - CBC with Differential/Platelet; Future - BMP with eGFR([facility]); Future - TSH; Future - Hemoglobin A1c; Future  4. RLS (restless legs syndrome) Ropinirole effective   5. Mixed hyperlipidemia LDL at goal  - continue Atorvastatin  - dietary modification and exercise at least 3 times per week for 30 minutes  - Lipid panel; Future  6. Pulmonary embolism on long-term anticoagulation therapy (HCC) No shortness of breath - continue on Xarelto   7. Chronic pain of right knee Worsening knee pain  - crepitus noted with ROM  - continue on Voltaren gel and tylenol  Will obtain imaging to rule out acute abnormalities  - DG Knee Complete 4 Views Right; Future  Family/ staff Communication: Reviewed plan of care with patient verbalized understanding  Labs/tests ordered:  - CBC with Differential/Platelet -  CMP with eGFR([facility]) - TSH - Hgb A1C - Lipid panel  Next Appointment : 6 months for medical management of chronic issues.Fasting Labs prior to visit.    Sandrea Hughs, NP

## 2020-12-12 ENCOUNTER — Other Ambulatory Visit: Payer: Self-pay | Admitting: Family

## 2020-12-12 DIAGNOSIS — G2581 Restless legs syndrome: Secondary | ICD-10-CM

## 2021-02-19 ENCOUNTER — Ambulatory Visit: Payer: PPO | Admitting: Family

## 2021-02-25 ENCOUNTER — Other Ambulatory Visit: Payer: Self-pay | Admitting: *Deleted

## 2021-02-25 DIAGNOSIS — I10 Essential (primary) hypertension: Secondary | ICD-10-CM

## 2021-02-25 DIAGNOSIS — G2581 Restless legs syndrome: Secondary | ICD-10-CM

## 2021-02-25 MED ORDER — RIVAROXABAN 20 MG PO TABS: ORAL_TABLET | ORAL | 1 refills | Status: DC

## 2021-02-25 MED ORDER — ATORVASTATIN CALCIUM 80 MG PO TABS: ORAL_TABLET | ORAL | 1 refills | Status: DC

## 2021-02-25 MED ORDER — ROPINIROLE HCL 0.5 MG PO TABS: 0.5000 mg | ORAL_TABLET | Freq: Every day | ORAL | 1 refills | Status: DC

## 2021-02-25 MED ORDER — AMLODIPINE BESYLATE 10 MG PO TABS: 10.0000 mg | ORAL_TABLET | Freq: Every day | ORAL | 1 refills | Status: DC

## 2021-02-25 NOTE — Telephone Encounter (Signed)
Patient called requesting refill and to schedule appointment.

## 2021-03-05 ENCOUNTER — Other Ambulatory Visit: Payer: Self-pay

## 2021-03-05 ENCOUNTER — Ambulatory Visit (INDEPENDENT_AMBULATORY_CARE_PROVIDER_SITE_OTHER): Payer: PPO | Admitting: Family

## 2021-03-05 ENCOUNTER — Encounter: Payer: Self-pay | Admitting: Family

## 2021-03-05 VITALS — BP 118/86 | HR 59 | Temp 97.4°F | Resp 15 | Ht 63.0 in | Wt 166.4 lb

## 2021-03-05 DIAGNOSIS — M25561 Pain in right knee: Secondary | ICD-10-CM | POA: Diagnosis not present

## 2021-03-05 DIAGNOSIS — E663 Overweight: Secondary | ICD-10-CM

## 2021-03-05 DIAGNOSIS — I10 Essential (primary) hypertension: Secondary | ICD-10-CM

## 2021-03-05 DIAGNOSIS — G8929 Other chronic pain: Secondary | ICD-10-CM | POA: Diagnosis not present

## 2021-03-05 DIAGNOSIS — E119 Type 2 diabetes mellitus without complications: Secondary | ICD-10-CM

## 2021-03-05 DIAGNOSIS — M1812 Unilateral primary osteoarthritis of first carpometacarpal joint, left hand: Secondary | ICD-10-CM | POA: Diagnosis not present

## 2021-03-05 DIAGNOSIS — E782 Mixed hyperlipidemia: Secondary | ICD-10-CM | POA: Diagnosis not present

## 2021-03-05 DIAGNOSIS — G2581 Restless legs syndrome: Secondary | ICD-10-CM | POA: Diagnosis not present

## 2021-03-05 DIAGNOSIS — Z6829 Body mass index (BMI) 29.0-29.9, adult: Secondary | ICD-10-CM

## 2021-03-05 DIAGNOSIS — Z7901 Long term (current) use of anticoagulants: Secondary | ICD-10-CM

## 2021-03-05 DIAGNOSIS — I2699 Other pulmonary embolism without acute cor pulmonale: Secondary | ICD-10-CM

## 2021-03-05 MED ORDER — RIVAROXABAN 20 MG PO TABS: ORAL_TABLET | ORAL | 1 refills | Status: DC

## 2021-03-05 NOTE — Progress Notes (Signed)
Provider: Marlowe Sax FNP-C   Jermery Caratachea, Nelda Bucks, NP  Patient Care Team: Edom Schmuhl, Nelda Bucks, NP as PCP - General (Family Medicine)  Extended Emergency Contact Information Primary Emergency Contact: Mcphail,Jeanette Address: [address]           [place], [place] [address] [place] Phone: [phone] Mobile Phone: [phone] Relation: Daughter Secondary Emergency Contact: Chiquita Loth [place] Phone: [phone] Mobile Phone: [phone] Relation: Son  Code Status:  DNR Goals of care: Advanced Directive information Advanced Directives 10/17/2020  Does Patient Have a Medical Advance Directive? Yes  Type of Paramedic of [place];Living will;Out of facility DNR (pink MOST or yellow form)  Does patient want to make changes to medical advance directive? No - Patient declined  Copy of [place] in Chart? Yes - validated most recent copy scanned in chart (See row information)  Would patient like information on creating a medical advance directive? -     Chief Complaint  Patient presents with   Medical Management of Chronic Issues    Medical Management of Chronic Issues. 4 Month follow up. Health Maintenance: Urine Microalbumin, Mammogram, Pneumonia patient refused, Shingrix patient refused and Flu Vaccine patient refused.      HPI:  Pt is a 80 y.o. female seen today for 4 months follow up medical management of chronic diseases.  Denies any shortness of breath  but states has to stop and rest sometimes when working around the house.  Hypertension - feels great! Does not check blood pressure at home.denies any headache,dizziness,vision changes,fatigue,chest tightness,palpitation,chest pain or shortness of breath.    DM - No CBG log. She denies any signs of hypoglycemia or hyperglycemia. Follows up with ophthalmology states has appointment to get new eye glasses.   Right knee - pain worst  at night sometimes has to put wrap on it.   Nasal congestion - uses nasal spray sometimes.   RLS - Requip helps but knee pain bothers her too at night.    Fasting for lab work today.  No fall or weight changes since previous visit.     Past Medical History:  Diagnosis Date   Absolute anemia    CAD in native artery    40% LAD at cath 02/04/16   Chronic anticoagulation 03/02/2016   Demand ischemia of myocardium - Not NSTEMI 02/03/2016   Dyslipidemia 03/02/2016   Dyspnea on exertion    Elevated troponin    Essential hypertension 11/27/2014   Frequency of urination    Hematuria    History of bladder cancer    a. 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   RLS (restless legs syndrome) 05/21/2016   Saddle embolus of pulmonary artery without acute cor pulmonale    Acute saddle PE treated with direct thrombolytics 02/07/16   Wears glasses    Wears partial dentures    upper   Past Surgical History:  Procedure Laterality Date   CARDIAC CATHETERIZATION N/A 02/04/2016   Procedure: RIGHT/LEFT HEART CATH AND CORONARY ANGIOGRAPHY;  Surgeon: Troy Sine, MD;  Location: [facility];  Service: Cardiovascular;  Laterality: N/A;   CYSTOSCOPY W/ RETROGRADES Bilateral 07/07/2015   Procedure: CYSTOSCOPY WITH RETROGRADE PYELOGRAM;  Surgeon: Cleon Gustin, MD;  Location: [facility];  Service: Urology;  Laterality: Bilateral;   IR GENERIC HISTORICAL  02/07/2016   IR ANGIOGRAM SELECTIVE EACH ADDITIONAL VESSEL 02/07/2016 Jacqulynn Cadet, MD [facility]   IR GENERIC HISTORICAL  02/07/2016   IR US GUIDE VASC ACCESS RIGHT 02/07/2016 Jacqulynn Cadet, MD [facility]   IR GENERIC HISTORICAL  02/07/2016   IR ANGIOGRAM PULMONARY BILATERAL SELECTIVE 02/07/2016 Jacqulynn Cadet, MD [facility]   IR GENERIC HISTORICAL  02/07/2016   IR INFUSION THROMBOL ARTERIAL INITIAL (MS) 02/07/2016 Jacqulynn Cadet, MD [facility]   IR GENERIC HISTORICAL  02/07/2016   IR INFUSION THROMBOL ARTERIAL INITIAL (MS) 02/07/2016 Jacqulynn Cadet, MD [facility]   IR GENERIC HISTORICAL  02/07/2016   IR ANGIOGRAM SELECTIVE EACH ADDITIONAL VESSEL 02/07/2016 Jacqulynn Cadet, MD [facility]   NO PAST SURGERIES     TRANSURETHRAL RESECTION OF BLADDER TUMOR WITH GYRUS (TURBT-GYRUS) N/A 07/07/2015   Procedure: TRANSURETHRAL RESECTION OF BLADDER TUMOR WITH GYRUS (TURBT-GYRUS);  Surgeon: Cleon Gustin, MD;  Location: [facility];  Service: Urology;  Laterality: N/A;    No Known Allergies  Allergies as of 03/05/2021   No Known Allergies      Medication List        Accurate as of March 05, 2021  1:03 PM. If you have any questions, ask your nurse or doctor.          acetaminophen 500 MG tablet Commonly known as: TYLENOL Take 500 mg by mouth every 6 (six) hours as needed for moderate pain.   amLODipine 10 MG tablet Commonly known as: NORVASC Take 1 tablet (10 mg total) by mouth daily.   atorvastatin 80 MG tablet Commonly known as: LIPITOR Take one tablet by mouth once daily at 6pm   diclofenac Sodium 1 % Gel Commonly known as: VOLTAREN Apply 2 g topically as needed.   nitroGLYCERIN 0.4 MG SL tablet Commonly known as: NITROSTAT Place 1 tablet (0.4 mg total) under the tongue every 5 (five) minutes x 3 doses as needed for chest pain.   oxymetazoline 0.05 % nasal spray Commonly known as: AFRIN Place 1 spray into both nostrils 2 (two) times daily.   rivaroxaban 20 MG Tabs tablet Commonly known as: Xarelto TAKE ONE TABLET BY MOUTH ONCE DAILY WITH SUPPER   rOPINIRole 0.5 MG tablet Commonly known as: REQUIP Take 1 tablet (0.5 mg total) by mouth at bedtime.   vitamin C 1000 MG tablet Take 1,000 mg by mouth daily.   Vitamin D 50 MCG  (2000 UT) tablet Take 2,000 Units by mouth daily.        Review of Systems  Constitutional:  Negative for appetite change, chills, fatigue, fever and unexpected weight change.  HENT:  Negative for dental problem, ear discharge, ear pain, facial swelling, hearing loss, nosebleeds, postnasal drip, rhinorrhea, sinus pressure, sinus pain, sneezing, sore throat, tinnitus and trouble swallowing.        Nasal spray effective   Eyes:  Negative for pain, discharge, redness, itching and visual disturbance.  Respiratory:  Negative for cough, chest tightness, shortness of breath and wheezing.   Cardiovascular:  Negative for chest pain, palpitations and leg swelling.  Gastrointestinal:  Negative for abdominal distention, abdominal pain, blood in stool, constipation, diarrhea, nausea and vomiting.  Endocrine: Negative for cold intolerance, heat intolerance, polydipsia, polyphagia and polyuria.  Genitourinary:  Negative for difficulty urinating, dysuria, flank pain, frequency and urgency.  Musculoskeletal:  Positive for arthralgias, back pain and joint swelling. Negative for gait problem, myalgias, neck pain  and neck stiffness.       Arthritis   Skin:  Negative for color change, pallor, rash and wound.  Neurological:  Negative for dizziness, syncope, speech difficulty, weakness, light-headedness, numbness and headaches.  Hematological:  Does not bruise/bleed easily.  Psychiatric/Behavioral:  Negative for agitation, behavioral problems, confusion, hallucinations, self-injury, sleep disturbance and suicidal ideas. The patient is not nervous/anxious.    Immunization History  Administered Date(s) Administered   Influenza,inj,Quad PF,6+ Mos 11/27/2014   Pneumococcal Conjugate-13 06/13/2017   Pertinent  Health Maintenance Due  Topic Date Due   URINE MICROALBUMIN  Never done   MAMMOGRAM  12/14/2020   INFLUENZA VACCINE  12/11/2021 (Originally 10/13/2020)   DEXA SCAN  Completed   Fall Risk 03/19/2019  07/17/2019 04/22/2020 09/01/2020 10/17/2020  Falls in the past year? 0 0 0 0 0  Was there an injury with Fall? 0 0 0 0 0  Fall Risk Category Calculator 0 0 0 0 0  Fall Risk Category _0   Patient Fall Risk Level _1   Patient at Risk for Falls Due to - - - No Fall Risks No Fall Risks  Fall risk Follow up - - - - Falls evaluation completed   Functional Status Survey:    Vitals:   03/05/21 1246  BP: 118/86  Pulse: (!) 59  Resp: 15  Temp: (!) 97.4 F (36.3 C)  TempSrc: Skin  SpO2: 98%  Weight: 166 lb 6.4 oz (75.5 kg)  Height: _2  (1.6 m)   Body mass index is 29.48 kg/m. Physical Exam Vitals reviewed.  Constitutional:      General: She is not in acute distress.    Appearance: Normal appearance. She is normal weight. She is not ill-appearing or diaphoretic.  HENT:     Head: Normocephalic.     Right Ear: Tympanic membrane, ear canal and external ear normal. There is no impacted cerumen.     Left Ear: Tympanic membrane, ear canal and external ear normal. There is no impacted cerumen.     Nose: Nose normal. No congestion or rhinorrhea.     Mouth/Throat:     Mouth: Mucous membranes are moist.     Pharynx: Oropharynx is clear. No oropharyngeal exudate or posterior oropharyngeal erythema.  Eyes:     General: No scleral icterus.       Right eye: No discharge.        Left eye: No discharge.     Extraocular Movements: Extraocular movements intact.     Conjunctiva/sclera: Conjunctivae normal.     Pupils: Pupils are equal, round, and reactive to light.  Neck:     Vascular: No carotid bruit.  Cardiovascular:     Rate and Rhythm: Normal rate and regular rhythm.     Pulses: Normal pulses.     Heart sounds: Normal heart sounds. No murmur heard.   No friction rub. No gallop.  Pulmonary:     Effort: Pulmonary effort is normal. No respiratory distress.     Breath sounds: Normal breath sounds. No wheezing,  rhonchi or rales.  Chest:     Chest wall: No tenderness.  Abdominal:     General: Bowel sounds are normal. There is no distension.     Palpations: Abdomen is soft. There is no mass.     Tenderness: There is no abdominal tenderness. There is no right CVA tenderness, left CVA tenderness, guarding or rebound.  Musculoskeletal:  General: No swelling or tenderness. Normal range of motion.     Cervical back: Normal range of motion. No rigidity or tenderness.     Right lower leg: No edema.     Left lower leg: No edema.  Lymphadenopathy:     Cervical: No cervical adenopathy.  Skin:    General: Skin is warm and dry.     Coloration: Skin is not pale.     Findings: No bruising, erythema, lesion or rash.  Neurological:     Mental Status: She is alert and oriented to person, place, and time.     Cranial Nerves: No cranial nerve deficit.     Sensory: No sensory deficit.     Motor: No weakness.     Coordination: Coordination normal.     Gait: Gait normal.  Psychiatric:        Mood and Affect: Mood normal.        Speech: Speech normal.        Behavior: Behavior normal.        Thought Content: Thought content normal.        Judgment: Judgment normal.    Labs reviewed: Recent Labs    04/22/20 1056 10/16/20 0946  NA 141 142  K 4.0 3.7  CL 107 106  CO2 27 28  GLUCOSE 127* 126*  BUN 17 12  CREATININE 0.52* 0.73  CALCIUM 9.4 9.2   Recent Labs    04/22/20 1056 10/16/20 0946  AST 22 23  ALT 18 16  BILITOT 0.4 0.5  PROT 7.6 7.5   Recent Labs    04/22/20 1056 10/16/20 0946  WBC 3.7* 4.0  NEUTROABS 1,976 2,104  HGB 12.8 11.9  HCT 38.3 37.3  MCV 91.2 89.2  PLT 269 249   Lab Results  Component Value Date   TSH 1.90 10/16/2020   Lab Results  Component Value Date   HGBA1C 7.2 (H) 10/16/2020   Lab Results  Component Value Date   CHOL 103 10/16/2020   HDL 43 (L) 10/16/2020   LDLCALC 47 10/16/2020   TRIG 57 10/16/2020   CHOLHDL 2.4 10/16/2020    Significant  Diagnostic Results in last 30 days:  No results found.  Assessment/Plan 1. Type 2 diabetes mellitus without complication, without long-term current use of insulin (HCC) Lab Results  Component Value Date   HGBA1C 7.2 (H) 10/16/2020  No home CBG for review. Continue with dietary modification.stay active in the house no exercise.  - MM DIGITAL SCREENING BILATERAL; Future - Microalbumin / creatinine urine ratio - Hemoglobin A1c - TSH - BMP with eGFR([facility]) - CBC with Differential/Platelet  2. Essential hypertension B/p well controlled  Continue on Amlodipine  - continue on Atorvastatin  - TSH - BMP with eGFR(Quest) - CBC with Differential/Platelet  3. Mixed hyperlipidemia - continue on Atorvastatin  - continue with dietary modification.  - Lipid panel  4. Pulmonary embolism on long-term anticoagulation therapy ([facility]) Continue on Xarelto  - no signs of bleeding reported  - rivaroxaban (XARELTO) 20 MG TABS tablet; TAKE ONE TABLET BY MOUTH ONCE DAILY WITH SUPPER  Dispense: 90 tablet; Refill: 1  5. Chronic pain of right knee Continue on tylenol PRN and Voltaren gel   6. Primary osteoarthritis of first carpometacarpal joint of left hand Continue on tylenol and Voltaren gel   7. RLS (restless legs syndrome) Continue on Requip   8. Overweight with body mass index (BMI) 25.0-29.9 BMI 29.48  Dietary modification and exercise as tolerated  9. Body  mass index 29.0-29.9, adult BMI 29.48  Dietary modification and exercise as above    Family/ staff Communication: Reviewed plan of care with patient verbalized understanding.   Labs/tests ordered: Has lab orders in place   Next Appointment : 6 months for medical management of chronic issues.Fasting Labs prior to visit.    Sandrea Hughs, NP

## 2021-03-06 LAB — LIPID PANEL
Cholesterol: 158 mg/dL (ref <200)
HDL: 45 mg/dL — ABNORMAL LOW (ref >=50)
LDL Cholesterol (Calc): 93 mg/dL (calc)
Non-HDL Cholesterol (Calc): 113 mg/dL (calc) (ref <130)
Total CHOL/HDL Ratio: 3.5 (calc) (ref <5.0)
Triglycerides: 100 mg/dL (ref <150)

## 2021-03-06 LAB — BASIC METABOLIC PANEL WITH GFR
BUN: 8 mg/dL (ref 7–25)
CO2: 26 mmol/L (ref 20–32)
Calcium: 9.6 mg/dL (ref 8.6–10.4)
Chloride: 105 mmol/L (ref 98–110)
Creat: 0.82 mg/dL (ref 0.60–1.00)
Glucose, Bld: 105 mg/dL — ABNORMAL HIGH (ref 65–99)
Potassium: 3.8 mmol/L (ref 3.5–5.3)
Sodium: 141 mmol/L (ref 135–146)
eGFR: 73 mL/min/{1.73_m2} (ref >=60)

## 2021-03-06 LAB — CBC WITH DIFFERENTIAL/PLATELET
Absolute Monocytes: 370 cells/uL (ref 200–950)
Basophils Absolute: 10 cells/uL (ref 0–200)
Basophils Relative: 0.3 %
Eosinophils Absolute: 50 cells/uL (ref 15–500)
Eosinophils Relative: 1.5 %
HCT: 37.2 % (ref 35.0–45.0)
Hemoglobin: 12.3 g/dL (ref 11.7–15.5)
Lymphs Abs: 1455 cells/uL (ref 850–3900)
MCH: 29.1 pg (ref 27.0–33.0)
MCHC: 33.1 g/dL (ref 32.0–36.0)
MCV: 87.9 fL (ref 80.0–100.0)
MPV: 10.1 fL (ref 7.5–12.5)
Monocytes Relative: 11.2 %
Neutro Abs: 1416 cells/uL — ABNORMAL LOW (ref 1500–7800)
Neutrophils Relative %: 42.9 %
Platelets: 288 10*3/uL (ref 140–400)
RBC: 4.23 10*6/uL (ref 3.80–5.10)
RDW: 14.1 % (ref 11.0–15.0)
Total Lymphocyte: 44.1 %
WBC: 3.3 10*3/uL — ABNORMAL LOW (ref 3.8–10.8)

## 2021-03-06 LAB — HEMOGLOBIN A1C
Hgb A1c MFr Bld: 7.2 % of total Hgb — ABNORMAL HIGH (ref <5.7)
Mean Plasma Glucose: 160 mg/dL
eAG (mmol/L): 8.9 mmol/L

## 2021-03-06 LAB — MICROALBUMIN / CREATININE URINE RATIO
Creatinine, Urine: 545 mg/dL — ABNORMAL HIGH (ref 20–275)
Microalb Creat Ratio: 13 mcg/mg creat (ref <30)
Microalb, Ur: 7.1 mg/dL

## 2021-03-06 LAB — TSH: TSH: 1.45 mIU/L (ref 0.40–4.50)

## 2021-03-12 ENCOUNTER — Other Ambulatory Visit: Payer: Self-pay

## 2021-03-12 ENCOUNTER — Ambulatory Visit (INDEPENDENT_AMBULATORY_CARE_PROVIDER_SITE_OTHER): Payer: PPO | Admitting: Family

## 2021-03-12 ENCOUNTER — Telehealth: Payer: Self-pay | Admitting: *Deleted

## 2021-03-12 DIAGNOSIS — E119 Type 2 diabetes mellitus without complications: Secondary | ICD-10-CM

## 2021-03-12 DIAGNOSIS — I10 Essential (primary) hypertension: Secondary | ICD-10-CM | POA: Diagnosis not present

## 2021-03-12 DIAGNOSIS — E782 Mixed hyperlipidemia: Secondary | ICD-10-CM

## 2021-03-12 MED ORDER — LISINOPRIL 2.5 MG PO TABS: 2.5000 mg | ORAL_TABLET | Freq: Every day | ORAL | 0 refills | Status: DC

## 2021-03-12 NOTE — Progress Notes (Signed)
Provider: Marlowe Sax FNP-C  Selestino Nila, Nelda Bucks, NP  Patient Care Team: Anber Mckiver, Nelda Bucks, NP as PCP - General (Family Medicine)  Extended Emergency Contact Information Primary Emergency Contact: Newsham,Jeanette Address: [address]           [place], [place] [address] [place] Phone: [phone] Mobile Phone: [phone] Relation: Daughter Secondary Emergency Contact: Chiquita Loth [place] Phone: [phone] Mobile Phone: [phone] Relation: Son  Code Status:  DNR Goals of care: Advanced Directive information Advanced Directives 10/17/2020  Does Patient Have a Medical Advance Directive? Yes  Type of Paramedic of [place];Living will;Out of facility DNR (pink MOST or yellow form)  Does patient want to make changes to medical advance directive? No - Patient declined  Copy of [place] in Chart? Yes - validated most recent copy scanned in chart (See row information)  Would patient like information on creating a medical advance directive? -     Chief Complaint  Patient presents with   Acute Visit    To Discuss Labwork    HPI:  Pt is a 80 y.o. female seen today for an acute visit to discuss recent lab results.  Urine Micro albumin showed no protein.  Glucose level and Hgb A 1 C  7.2 still slightly high but stable compared to previous level.   Thyroid level ,electrolyte and liver function are normal   Cholesterol level are normal except HDL is slightly low.  White blood Cell was 3.3 takes vitamin C supplement.    Past Medical History:  Diagnosis Date   Absolute anemia    CAD in native artery    40% LAD at cath 02/04/16   Chronic anticoagulation 03/02/2016   Demand ischemia of myocardium - Not NSTEMI 02/03/2016   Dyslipidemia 03/02/2016   Dyspnea on exertion    Elevated troponin    Essential hypertension 11/27/2014   Frequency of urination    Hematuria    History  of bladder cancer    a. 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   RLS (restless legs syndrome) 05/21/2016   Saddle embolus of pulmonary artery without acute cor pulmonale    Acute saddle PE treated with direct thrombolytics 02/07/16   Wears glasses    Wears partial dentures    upper   Past Surgical History:  Procedure Laterality Date   CARDIAC CATHETERIZATION N/A 02/04/2016   Procedure: RIGHT/LEFT HEART CATH AND CORONARY ANGIOGRAPHY;  Surgeon: Troy Sine, MD;  Location: [facility];  Service: Cardiovascular;  Laterality: N/A;   CYSTOSCOPY W/ RETROGRADES Bilateral 07/07/2015   Procedure: CYSTOSCOPY WITH RETROGRADE PYELOGRAM;  Surgeon: Cleon Gustin, MD;  Location: [facility];  Service: Urology;  Laterality: Bilateral;   IR GENERIC HISTORICAL  02/07/2016   IR ANGIOGRAM SELECTIVE EACH ADDITIONAL VESSEL 02/07/2016 Jacqulynn Cadet, MD [facility]   IR GENERIC HISTORICAL  02/07/2016   IR US GUIDE VASC ACCESS RIGHT 02/07/2016 Jacqulynn Cadet, MD [facility]   IR GENERIC HISTORICAL  02/07/2016   IR ANGIOGRAM PULMONARY BILATERAL SELECTIVE 02/07/2016 Jacqulynn Cadet, MD [facility]   IR GENERIC HISTORICAL  02/07/2016   IR INFUSION THROMBOL ARTERIAL INITIAL (MS) 02/07/2016 Jacqulynn Cadet, MD [facility]   IR GENERIC HISTORICAL  02/07/2016  IR INFUSION THROMBOL ARTERIAL INITIAL (MS) 02/07/2016 Jacqulynn Cadet, MD [facility]   IR GENERIC HISTORICAL  02/07/2016   IR ANGIOGRAM SELECTIVE EACH ADDITIONAL VESSEL 02/07/2016 Jacqulynn Cadet, MD [facility]   NO PAST SURGERIES     TRANSURETHRAL RESECTION OF BLADDER TUMOR WITH GYRUS (TURBT-GYRUS) N/A 07/07/2015   Procedure: TRANSURETHRAL RESECTION OF BLADDER TUMOR WITH GYRUS (TURBT-GYRUS);  Surgeon:  Cleon Gustin, MD;  Location: [facility];  Service: Urology;  Laterality: N/A;    No Known Allergies  Outpatient Encounter Medications as of 03/12/2021  Medication Sig   acetaminophen (TYLENOL) 500 MG tablet Take 500 mg by mouth every 6 (six) hours as needed for moderate pain.   amLODipine (NORVASC) 10 MG tablet Take 1 tablet (10 mg total) by mouth daily.   Ascorbic Acid (VITAMIN C) 1000 MG tablet Take 1,000 mg by mouth daily.   atorvastatin (LIPITOR) 80 MG tablet Take one tablet by mouth once daily at 6pm   Cholecalciferol (VITAMIN D) 2000 units tablet Take 2,000 Units by mouth daily.   diclofenac Sodium (VOLTAREN) 1 % GEL Apply 2 g topically as needed.   nitroGLYCERIN (NITROSTAT) 0.4 MG SL tablet Place 1 tablet (0.4 mg total) under the tongue every 5 (five) minutes x 3 doses as needed for chest pain.   oxymetazoline (AFRIN) 0.05 % nasal spray Place 1 spray into both nostrils 2 (two) times daily.   rivaroxaban (XARELTO) 20 MG TABS tablet TAKE ONE TABLET BY MOUTH ONCE DAILY WITH SUPPER   rOPINIRole (REQUIP) 0.5 MG tablet Take 1 tablet (0.5 mg total) by mouth at bedtime.   No facility-administered encounter medications on file as of 03/12/2021.    Review of Systems  Constitutional:  Negative for appetite change, chills, fatigue, fever and unexpected weight change.  HENT:  Negative for congestion, dental problem, ear discharge, ear pain, facial swelling, hearing loss, nosebleeds, postnasal drip, rhinorrhea, sinus pressure, sinus pain, sneezing, sore throat and tinnitus.   Eyes:  Negative for pain, discharge, redness, itching and visual disturbance.  Respiratory:  Negative for cough, chest tightness, shortness of breath and wheezing.   Cardiovascular:  Negative for chest pain, palpitations and leg swelling.  Gastrointestinal:  Negative for abdominal distention, abdominal pain, constipation, diarrhea, nausea and vomiting.  Endocrine: Negative for cold intolerance, heat  intolerance, polydipsia, polyphagia and polyuria.  Skin:  Negative for color change, pallor and rash.  Neurological:  Negative for dizziness, syncope, speech difficulty, light-headedness, numbness and headaches.  Hematological:  Does not bruise/bleed easily.  Psychiatric/Behavioral:  Negative for agitation, behavioral problems and sleep disturbance. The patient is not nervous/anxious.    Immunization History  Administered Date(s) Administered   Influenza,inj,Quad PF,6+ Mos 11/27/2014   Pneumococcal Conjugate-13 06/13/2017   Pertinent  Health Maintenance Due  Topic Date Due   MAMMOGRAM  12/14/2020   INFLUENZA VACCINE  12/11/2021 (Originally 10/13/2020)   URINE MICROALBUMIN  03/05/2022   DEXA SCAN  Completed   Fall Risk 03/19/2019 07/17/2019 04/22/2020 09/01/2020 10/17/2020  Falls in the past year? 0 0 0 0 0  Was there an injury with Fall? 0 0 0 0 0  Fall Risk Category Calculator 0 0 0 0 0  Fall Risk Category Low Low Low Low Low  Patient Fall Risk Level Low fall risk Low fall risk Low fall risk Low fall risk Low fall risk  Patient at Risk for Falls Due to - - - No Fall Risks No Fall Risks  Fall risk Follow up - - - - Falls  evaluation completed   Functional Status Survey:    There were no vitals filed for this visit. There is no height or weight on file to calculate BMI. Physical Exam Unable to complete on Telephone visit.  Labs reviewed:  Recent Labs    04/22/20 1056 10/16/20 0946  AST 22 23  ALT 18 16  BILITOT 0.4 0.5  PROT 7.6 7.5   Recent Labs    04/22/20 1056 10/16/20 0946 03/05/21 1337  WBC 3.7* 4.0 3.3*  NEUTROABS 1,976 2,104 1,416*  HGB 12.8 11.9 12.3  HCT 38.3 37.3 37.2  MCV 91.2 89.2 87.9  PLT 269 249 288   Lab Results  Component Value Date   TSH 1.45 03/05/2021   Lab Results  Component Value Date   HGBA1C 7.2 (H) 03/05/2021   Lab Results  Component Value Date   CHOL 158 03/05/2021   HDL 45 (L) 03/05/2021   LDLCALC 93 03/05/2021   TRIG 100 03/05/2021    CHOLHDL 3.5 03/05/2021    Significant Diagnostic Results in last 30 days:  No results found.  Assessment/Plan  1. Essential hypertension No home blood pressure readings for review  -- Advised to check Blood pressure at home and record on log provided and notify provider if B/p > 140/90 or < 100/60  - start on lisinopril 2.5 mg tablet one by mouth daily for renal protection.  - CBC with Differential/Platelet; Future - BMP with eGFR([facility]); Future - TSH; Future  2. Type 2 diabetes mellitus without complication, without long-term current use of insulin (HCC) Lab Results  Component Value Date   HGBA1C 7.2 (H) 03/05/2021  - Dietary modification and exercise at least 3 times per week for 30 minutes advised. - additional DASH Eating plan Education information provided on AVS  Will recheck A 1 C in 6 months then start on metformin if indicated.  Start on lisinopril for renal protection - CBC with Differential/Platelet; Future - BMP with eGFR([facility]); Future - TSH; Future - Hemoglobin A1c; Future - lisinopril (ZESTRIL) 2.5 MG tablet; Take 1 tablet (2.5 mg total) by mouth daily.  Dispense: 90 tablet; Refill: 0  3. Mixed hyperlipidemia LDL at goal continue on atorvastatin  - Dietary modification and exercise at least 3 times per week for 30 minutes advised. - additional DASH Eating plan Education information provided on AVS  - Lipid panel; Future  Family/ staff Communication: Reviewed plan of care with patient verbalized understanding.   Labs/tests ordered:  - CBC with Differential/Platelet; Future - BMP with eGFR([facility]); Future - TSH; Future - Lipid panel; Future - Hemoglobin A1c; Future  Next Appointment: 6 months for Fasting Labs prior to visit.   I connected with  Ishmael Holter on 03/12/21 by a Telephone enabled telemedicine application and verified that I am speaking with the correct person using two identifiers.   I discussed the limitations of evaluation and  management by telemedicine.The patient expressed understanding and agreed to proceed.  Spent 12 minutes of non-face to face with patient  >50% time spent counseling; reviewing labs and developing future plan of care.     Sandrea Hughs, NP

## 2021-03-12 NOTE — Telephone Encounter (Signed)
Ms. Catherine Snyder, Catherine Snyder are scheduled for a virtual visit with your provider today.    Just as we do with appointments in the office, we must obtain your consent to participate.  Your consent will be active for this visit and any virtual visit you Catherine Snyder have with one of our providers in the next 365 days.    If you have a MyChart account, I can also send a copy of this consent to you electronically.  All virtual visits are billed to your insurance company just like a traditional visit in the office.  As this is a virtual visit, video technology does not allow for your provider to perform a traditional examination.  This Catherine Snyder limit your provider's ability to fully assess your condition.  If your provider identifies any concerns that need to be evaluated in person or the need to arrange testing such as labs, EKG, etc, we will make arrangements to do so.    Although advances in technology are sophisticated, we cannot ensure that it will always work on either your end or our end.  If the connection with a video visit is poor, we Catherine Snyder have to switch to a telephone visit.  With either a video or telephone visit, we are not always able to ensure that we have a secure connection.   I need to obtain your verbal consent now.   Are you willing to proceed with your visit today?   Catherine Snyder has provided verbal consent on 03/12/2021 for a virtual visit (video or telephone).   Catherine Snyder, [place] 03/12/2021  12:08 PM

## 2021-03-12 NOTE — Progress Notes (Signed)
This service is provided via telemedicine  No vital signs collected/recorded due to the encounter was a telemedicine visit.   Location of patient (ex: home, work):  home  Patient consents to a telephone visit:  yes  Location of the provider (ex: office, home):  [facility] Office  Name of any referring provider:  Dinah Ngetich,NP  Names of all persons participating in the telemedicine service and their role in the encounter:  Rodena Piety CMA, Quita Skye, Marlowe Sax, NP  Time spent on call:  6:45

## 2021-03-12 NOTE — Patient Instructions (Signed)
- Dietary modification and exercise at least 3 times per week for 30 minutes advised.  DASH Eating Plan DASH stands for Dietary Approaches to Stop Hypertension. The DASH eating plan is a healthy eating plan that has been shown to: Reduce high blood pressure (hypertension). Reduce your risk for type 2 diabetes, heart disease, and stroke. Help with weight loss. What are tips for following this plan? Reading food labels Check food labels for the amount of salt (sodium) per serving. Choose foods with less than 5 percent of the Daily Value of sodium. Generally, foods with less than 300 milligrams (mg) of sodium per serving fit into this eating plan. To find whole grains, look for the word "whole" as the first word in the ingredient list. Shopping Buy products labeled as "low-sodium" or "no salt added." Buy fresh foods. Avoid canned foods and pre-made or frozen meals. Cooking Avoid adding salt when cooking. Use salt-free seasonings or herbs instead of table salt or sea salt. Check with your health care provider or pharmacist before using salt substitutes. Do not fry foods. Cook foods using healthy methods such as baking, boiling, grilling, roasting, and broiling instead. Cook with heart-healthy oils, such as olive, canola, avocado, soybean, or sunflower oil. Meal planning  Eat a balanced diet that includes: 4 or more servings of fruits and 4 or more servings of vegetables each day. Try to fill one-half of your plate with fruits and vegetables. 6-8 servings of whole grains each day. Less than 6 oz (170 g) of lean meat, poultry, or fish each day. A 3-oz (85-g) serving of meat is about the same size as a deck of cards. One egg equals 1 oz (28 g). 2-3 servings of low-fat dairy each day. One serving is 1 cup (237 mL). 1 serving of nuts, seeds, or beans 5 times each week. 2-3 servings of heart-healthy fats. Healthy fats called omega-3 fatty acids are found in foods such as walnuts, flaxseeds, fortified  milks, and eggs. These fats are also found in cold-water fish, such as sardines, salmon, and mackerel. Limit how much you eat of: Canned or prepackaged foods. Food that is high in trans fat, such as some fried foods. Food that is high in saturated fat, such as fatty meat. Desserts and other sweets, sugary drinks, and other foods with added sugar. Full-fat dairy products. Do not salt foods before eating. Do not eat more than 4 egg yolks a week. Try to eat at least 2 vegetarian meals a week. Eat more home-cooked food and less restaurant, buffet, and fast food. Lifestyle When eating at a restaurant, ask that your food be prepared with less salt or no salt, if possible. If you drink alcohol: Limit how much you use to: 0-1 drink a day for women who are not pregnant. 0-2 drinks a day for men. Be aware of how much alcohol is in your drink. In the [place] one drink equals one 12 oz bottle of beer (355 mL), one 5 oz glass of wine (148 mL), or one 1 oz glass of hard liquor (44 mL). General information Avoid eating more than 2,300 mg of salt a day. If you have hypertension, you may need to reduce your sodium intake to 1,500 mg a day. Work with your health care provider to maintain a healthy body weight or to lose weight. Ask what an ideal weight is for you. Get at least 30 minutes of exercise that causes your heart to beat faster (aerobic exercise) most days of the week.  Activities may include walking, swimming, or biking. Work with your health care provider or dietitian to adjust your eating plan to your individual calorie needs. What foods should I eat? Fruits All fresh, dried, or frozen fruit. Canned fruit in natural juice (without added sugar). Vegetables Fresh or frozen vegetables (raw, steamed, roasted, or grilled). Low-sodium or reduced-sodium tomato and vegetable juice. Low-sodium or reduced-sodium tomato sauce and tomato paste. Low-sodium or reduced-sodium canned  vegetables. Grains Whole-grain or whole-wheat bread. Whole-grain or whole-wheat pasta. Brown rice. [place] Morrow. Bulgur. Whole-grain and low-sodium cereals. Pita bread. Low-fat, low-sodium crackers. Whole-wheat flour tortillas. Meats and other proteins Skinless chicken or [place]. Ground chicken or [place]. Pork with fat trimmed off. Fish and seafood. Egg whites. Dried beans, peas, or lentils. Unsalted nuts, nut butters, and seeds. Unsalted canned beans. Lean cuts of beef with fat trimmed off. Low-sodium, lean precooked or cured meat, such as sausages or meat loaves. Dairy Low-fat (1%) or fat-free (skim) milk. Reduced-fat, low-fat, or fat-free cheeses. Nonfat, low-sodium ricotta or cottage cheese. Low-fat or nonfat yogurt. Low-fat, low-sodium cheese. Fats and oils Soft margarine without trans fats. Vegetable oil. Reduced-fat, low-fat, or light mayonnaise and salad dressings (reduced-sodium). Canola, safflower, olive, avocado, soybean, and sunflower oils. Avocado. Seasonings and condiments Herbs. Spices. Seasoning mixes without salt. Other foods Unsalted popcorn and pretzels. Fat-free sweets. The items listed above may not be a complete list of foods and beverages you can eat. Contact a dietitian for more information. What foods should I avoid? Fruits Canned fruit in a light or heavy syrup. Fried fruit. Fruit in cream or butter sauce. Vegetables Creamed or fried vegetables. Vegetables in a cheese sauce. Regular canned vegetables (not low-sodium or reduced-sodium). Regular canned tomato sauce and paste (not low-sodium or reduced-sodium). Regular tomato and vegetable juice (not low-sodium or reduced-sodium). Angie Fava. Olives. Grains Baked goods made with fat, such as croissants, muffins, or some breads. Dry pasta or rice meal packs. Meats and other proteins Fatty cuts of meat. Ribs. Fried meat. Berniece Salines. Bologna, salami, and other precooked or cured meats, such as sausages or meat loaves. Fat from  the back of a pig (fatback). Bratwurst. Salted nuts and seeds. Canned beans with added salt. Canned or smoked fish. Whole eggs or egg yolks. Chicken or [place] with skin. Dairy Whole or 2% milk, cream, and half-and-half. Whole or full-fat cream cheese. Whole-fat or sweetened yogurt. Full-fat cheese. Nondairy creamers. Whipped toppings. Processed cheese and cheese spreads. Fats and oils Butter. Stick margarine. Lard. Shortening. Ghee. Bacon fat. Tropical oils, such as coconut, palm kernel, or palm oil. Seasonings and condiments Onion salt, garlic salt, seasoned salt, table salt, and sea salt. Worcestershire sauce. Tartar sauce. Barbecue sauce. Teriyaki sauce. Soy sauce, including reduced-sodium. Steak sauce. Canned and packaged gravies. Fish sauce. Oyster sauce. Cocktail sauce. Store-bought horseradish. Ketchup. Mustard. Meat flavorings and tenderizers. Bouillon cubes. Hot sauces. Pre-made or packaged marinades. Pre-made or packaged taco seasonings. Relishes. Regular salad dressings. Other foods Salted popcorn and pretzels. The items listed above may not be a complete list of foods and beverages you should avoid. Contact a dietitian for more information. Where to find more information National Heart, Lung, and Blood Institute: https://wilson-eaton.com/ American Heart Association: www.heart.org Academy of Nutrition and Dietetics: www.eatright.[place]: www.kidney.org Summary The DASH eating plan is a healthy eating plan that has been shown to reduce high blood pressure (hypertension). It may also reduce your risk for type 2 diabetes, heart disease, and stroke. When on the DASH eating plan, aim to eat more  fresh fruits and vegetables, whole grains, lean proteins, low-fat dairy, and heart-healthy fats. With the DASH eating plan, you should limit salt (sodium) intake to 2,300 mg a day. If you have hypertension, you may need to reduce your sodium intake to 1,500 mg a day. Work with your  health care provider or dietitian to adjust your eating plan to your individual calorie needs. This information is not intended to replace advice given to you by your health care provider. Make sure you discuss any questions you have with your health care provider. Document Revised: 02/02/2019 Document Reviewed: 02/02/2019 Elsevier Patient Education  2022 [employer].

## 2021-06-08 ENCOUNTER — Other Ambulatory Visit: Payer: Self-pay | Admitting: Family

## 2021-06-08 DIAGNOSIS — E119 Type 2 diabetes mellitus without complications: Secondary | ICD-10-CM

## 2021-06-15 ENCOUNTER — Other Ambulatory Visit: Payer: Self-pay | Admitting: Family

## 2021-06-15 DIAGNOSIS — Z1231 Encounter for screening mammogram for malignant neoplasm of breast: Secondary | ICD-10-CM

## 2021-08-11 ENCOUNTER — Ambulatory Visit
Admission: RE | Admit: 2021-08-11 | Discharge: 2021-08-11 | Disposition: A | Discharge status: Home / Self Care | Payer: PPO | Source: Ambulatory Visit | Attending: Family | Admitting: Family

## 2021-08-11 DIAGNOSIS — Z1231 Encounter for screening mammogram for malignant neoplasm of breast: Secondary | ICD-10-CM | POA: Diagnosis not present

## 2021-08-29 ENCOUNTER — Other Ambulatory Visit: Payer: Self-pay | Admitting: Family

## 2021-08-29 DIAGNOSIS — G2581 Restless legs syndrome: Secondary | ICD-10-CM

## 2021-09-07 ENCOUNTER — Other Ambulatory Visit: Payer: PPO

## 2021-09-07 DIAGNOSIS — I10 Essential (primary) hypertension: Secondary | ICD-10-CM

## 2021-09-07 DIAGNOSIS — E119 Type 2 diabetes mellitus without complications: Secondary | ICD-10-CM

## 2021-09-07 DIAGNOSIS — E782 Mixed hyperlipidemia: Secondary | ICD-10-CM

## 2021-09-08 ENCOUNTER — Encounter: Payer: Self-pay | Admitting: Family

## 2021-09-08 ENCOUNTER — Ambulatory Visit (INDEPENDENT_AMBULATORY_CARE_PROVIDER_SITE_OTHER): Payer: PPO | Admitting: Family

## 2021-09-08 DIAGNOSIS — Z0000 Encounter for general adult medical examination without abnormal findings: Secondary | ICD-10-CM | POA: Diagnosis not present

## 2021-09-10 ENCOUNTER — Encounter: Payer: Self-pay | Admitting: Family

## 2021-09-10 ENCOUNTER — Ambulatory Visit (INDEPENDENT_AMBULATORY_CARE_PROVIDER_SITE_OTHER): Payer: PPO | Admitting: Family

## 2021-09-10 VITALS — BP 150/84 | HR 70 | Temp 97.1°F | Resp 16 | Ht 63.0 in | Wt 158.8 lb

## 2021-09-10 DIAGNOSIS — I272 Pulmonary hypertension, unspecified: Secondary | ICD-10-CM

## 2021-09-10 DIAGNOSIS — M25561 Pain in right knee: Secondary | ICD-10-CM | POA: Diagnosis not present

## 2021-09-10 DIAGNOSIS — G8929 Other chronic pain: Secondary | ICD-10-CM

## 2021-09-10 DIAGNOSIS — E663 Overweight: Secondary | ICD-10-CM

## 2021-09-10 DIAGNOSIS — Z23 Encounter for immunization: Secondary | ICD-10-CM

## 2021-09-10 DIAGNOSIS — E782 Mixed hyperlipidemia: Secondary | ICD-10-CM | POA: Diagnosis not present

## 2021-09-10 DIAGNOSIS — Z6828 Body mass index (BMI) 28.0-28.9, adult: Secondary | ICD-10-CM | POA: Diagnosis not present

## 2021-09-10 DIAGNOSIS — E119 Type 2 diabetes mellitus without complications: Secondary | ICD-10-CM | POA: Diagnosis not present

## 2021-09-10 DIAGNOSIS — Z86711 Personal history of pulmonary embolism: Secondary | ICD-10-CM | POA: Diagnosis not present

## 2021-09-10 DIAGNOSIS — I10 Essential (primary) hypertension: Secondary | ICD-10-CM

## 2021-09-10 DIAGNOSIS — G2581 Restless legs syndrome: Secondary | ICD-10-CM | POA: Diagnosis not present

## 2021-09-10 NOTE — Progress Notes (Signed)
Provider: Marlowe Sax FNP-C   Genice Kimberlin, Nelda Bucks, NP  Patient Care Team: Tifini Reeder, Nelda Bucks, NP as PCP - General (Family Medicine)  Extended Emergency Contact Information Primary Emergency Contact: Peach,Jeanette Address: [address]           [place], [place] [address] [place] Phone: [phone] Mobile Phone: [phone] Relation: Daughter Secondary Emergency Contact: Chiquita Loth [place] Phone: [phone] Mobile Phone: [phone] Relation: Son  Code Status:  DNR Goals of care: Advanced Directive information    09/10/2021   10:33 AM  Advanced Directives  Does Patient Have a Medical Advance Directive? Yes  Type of Paramedic of [place];Living will  Does patient want to make changes to medical advance directive? No - Patient declined  Copy of [place] in Chart? Yes - validated most recent copy scanned in chart (See row information)     Chief Complaint  Patient presents with   Medical Management of Chronic Issues    6 month follow up/ Fasting Labs   Immunizations    Discuss the need for Tetanus vaccine, and Shingrix vaccine.    HPI:  Pt is a 81 y.o. female seen today for 6 months follow up for medical management of chronic diseases.    Hypertension -Has cut down on salt and bacon.she denies any headache,dizziness,vision changes,fatigue,chest tightness,palpitation,chest pain or shortness of breath.      Type 2 DM - not checking CBG   Hyperlipidemia - Has cut back on sugary foods.Also veggies like broccoli.does not eat too much fried foods. Taking Atorvastatin.  Does exercise by stretching and walks with 68 yr old granddaughter.she does not go for long walk since she has no one to walk with.   Eats breakfast and dinner since she eats late breakfast.   RLS - sometimes get the cramps on right leg but not restless as it used to.Sleeps throughout.  Due for Tetanus and  shingles   Knee pain - has not required voltaren gel.uses [place] [employer] butter.No fall episode.    Past Medical History:  Diagnosis Date   Absolute anemia    CAD in native artery    40% LAD at cath 02/04/16   Chronic anticoagulation 03/02/2016   Demand ischemia of myocardium - Not NSTEMI 02/03/2016   Dyslipidemia 03/02/2016   Dyspnea on exertion    Elevated troponin    Essential hypertension 11/27/2014   Frequency of urination    Hematuria    History of bladder cancer    a. 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   RLS (restless legs syndrome) 05/21/2016   Saddle embolus of pulmonary artery without acute cor pulmonale    Acute saddle PE treated with direct thrombolytics 02/07/16   Wears glasses    Wears partial dentures    upper   Past Surgical History:  Procedure Laterality Date   CARDIAC CATHETERIZATION N/A 02/04/2016   Procedure: RIGHT/LEFT HEART CATH AND CORONARY ANGIOGRAPHY;  Surgeon: Troy Sine, MD;  Location: [facility];  Service: Cardiovascular;  Laterality: N/A;   CYSTOSCOPY W/ RETROGRADES Bilateral 07/07/2015   Procedure: CYSTOSCOPY WITH RETROGRADE PYELOGRAM;  Surgeon: Cleon Gustin, MD;  Location: [facility];  Service: Urology;  Laterality: Bilateral;   IR GENERIC HISTORICAL  02/07/2016   IR ANGIOGRAM SELECTIVE EACH ADDITIONAL VESSEL 02/07/2016 Jacqulynn Cadet, MD [facility]   IR GENERIC HISTORICAL  02/07/2016   IR US GUIDE VASC ACCESS RIGHT 02/07/2016 Jacqulynn Cadet, MD [facility]   IR GENERIC HISTORICAL  02/07/2016   IR ANGIOGRAM PULMONARY BILATERAL SELECTIVE 02/07/2016 Jacqulynn Cadet, MD [facility]   IR GENERIC HISTORICAL  02/07/2016   IR INFUSION THROMBOL ARTERIAL INITIAL (MS) 02/07/2016 Jacqulynn Cadet, MD  [facility]   IR GENERIC HISTORICAL  02/07/2016   IR INFUSION THROMBOL ARTERIAL INITIAL (MS) 02/07/2016 Jacqulynn Cadet, MD [facility]   IR GENERIC HISTORICAL  02/07/2016   IR ANGIOGRAM SELECTIVE EACH ADDITIONAL VESSEL 02/07/2016 Jacqulynn Cadet, MD [facility]   NO PAST SURGERIES     TRANSURETHRAL RESECTION OF BLADDER TUMOR WITH GYRUS (TURBT-GYRUS) N/A 07/07/2015   Procedure: TRANSURETHRAL RESECTION OF BLADDER TUMOR WITH GYRUS (TURBT-GYRUS);  Surgeon: Cleon Gustin, MD;  Location: [facility];  Service: Urology;  Laterality: N/A;    No Known Allergies  Allergies as of 09/10/2021   No Known Allergies      Medication List        Accurate as of September 10, 2021 10:35 AM. If you have any questions, ask your nurse or doctor.          acetaminophen 500 MG tablet Commonly known as: TYLENOL Take 500 mg by mouth every 6 (six) hours as needed for moderate pain.   amLODipine 10 MG tablet Commonly known as: NORVASC Take 1 tablet (10 mg total) by mouth daily.   atorvastatin 80 MG tablet Commonly known as: LIPITOR TAKE ONE TABLET BY MOUTH ONCE DAILY AT 6PM   diclofenac Sodium 1 % Gel Commonly known as: VOLTAREN Apply 2 g topically as needed.   lisinopril 2.5 MG tablet Commonly known as: ZESTRIL TAKE 1 TABLET BY MOUTH EVERY DAY   nitroGLYCERIN 0.4 MG SL tablet Commonly known as: NITROSTAT Place 1 tablet (0.4 mg total) under the tongue every 5 (five) minutes x 3 doses as needed for chest pain.   oxymetazoline 0.05 % nasal spray Commonly known as: AFRIN Place 1 spray into both nostrils 2 (two) times daily.   rivaroxaban 20 MG Tabs tablet Commonly known as: Xarelto TAKE ONE TABLET BY MOUTH ONCE DAILY WITH SUPPER   rOPINIRole 0.5 MG tablet Commonly known as: REQUIP TAKE 1 TABLET BY MOUTH AT BEDTIME.   vitamin C 1000 MG tablet Take 1,000 mg by mouth daily.   Vitamin D 50 MCG (2000 UT) tablet Take 2,000 Units by mouth daily.        Review  of Systems  Constitutional:  Negative for appetite change, chills, fatigue, fever and unexpected weight change.  HENT:  Negative for congestion, dental problem, ear discharge, ear pain, facial swelling, hearing loss, nosebleeds, postnasal drip, rhinorrhea, sinus pressure, sinus pain, sneezing, sore throat, tinnitus and trouble swallowing.   Eyes:  Positive for visual disturbance. Negative for pain, discharge, redness and itching.       Wears eye glasses for reading but overall does not use them. Follows up with Ophthalmology   Respiratory:  Negative for cough, chest tightness, shortness of breath and wheezing.   Cardiovascular:  Negative for chest pain, palpitations and leg swelling.  Gastrointestinal:  Negative for abdominal distention, abdominal pain, blood in stool, constipation, diarrhea, nausea and vomiting.  Endocrine: Negative for cold intolerance, heat intolerance, polydipsia, polyphagia and polyuria.  Genitourinary:  Negative for difficulty urinating, dysuria, flank pain, frequency and urgency.  Musculoskeletal:  Negative for arthralgias, back pain, gait problem, joint swelling, myalgias, neck pain and neck stiffness.  Skin:  Negative for color change, pallor, rash and wound.  Neurological:  Negative for dizziness, syncope, speech difficulty, weakness, light-headedness, numbness and headaches.  Hematological:  Does not bruise/bleed easily.  Psychiatric/Behavioral:  Negative for agitation, behavioral problems, confusion, hallucinations, self-injury, sleep disturbance and suicidal ideas. The patient is not nervous/anxious.     Immunization History  Administered Date(s) Administered   Influenza,inj,Quad PF,6+ Mos 11/27/2014   Pneumococcal Conjugate-13 06/13/2017   Pertinent  Health Maintenance Due  Topic Date Due   INFLUENZA VACCINE  10/13/2021   MAMMOGRAM  08/12/2023   DEXA SCAN  Completed      04/22/2020   10:09 AM 09/01/2020   10:45 AM 10/17/2020    3:51 PM 09/08/2021    3:22 PM  09/10/2021   10:33 AM  Fall Risk  Falls in the past year? 0 0 0 0 0  Was there an injury with Fall? 0 0 0 0 0  Fall Risk Category Calculator 0 0 0 0 0  Fall Risk Category Low Low Low Low Low  Patient Fall Risk Level Low fall risk Low fall risk Low fall risk Low fall risk Low fall risk  Patient at Risk for Falls Due to  No Fall Risks No Fall Risks No Fall Risks No Fall Risks  Fall risk Follow up   Falls evaluation completed Falls evaluation completed Falls evaluation completed   Functional Status Survey:    Vitals:   09/10/21 1028  BP: (!) 150/84  Pulse: 70  Resp: 16  Temp: (!) 97.1 F (36.2 C)  SpO2: 98%  Weight: 158 lb 12.8 oz (72 kg)  Height: '5\' 3"'$  (1.6 m)   Body mass index is 28.13 kg/m. Physical Exam Vitals reviewed.  Constitutional:      General: She is not in acute distress.    Appearance: Normal appearance. She is normal weight. She is not ill-appearing or diaphoretic.  HENT:     Head: Normocephalic.     Right Ear: Tympanic membrane, ear canal and external ear normal. There is no impacted cerumen.     Left Ear: Tympanic membrane, ear canal and external ear normal. There is no impacted cerumen.     Nose: Nose normal. No congestion or rhinorrhea.     Mouth/Throat:     Mouth: Mucous membranes are moist.     Pharynx: Oropharynx is clear. No oropharyngeal exudate or posterior oropharyngeal erythema.  Eyes:     General: No scleral icterus.       Right eye: No discharge.        Left eye: No discharge.     Extraocular Movements: Extraocular movements intact.     Conjunctiva/sclera: Conjunctivae normal.     Pupils: Pupils are equal, round, and reactive to light.  Neck:     Vascular: No carotid bruit.  Cardiovascular:     Rate and Rhythm: Normal rate and regular rhythm.     Pulses: Normal pulses.     Heart sounds: Normal heart sounds. No murmur heard.    No friction rub. No gallop.  Pulmonary:     Effort: Pulmonary effort is normal. No respiratory distress.      Breath sounds: Normal breath sounds. No wheezing, rhonchi or rales.  Chest:     Chest wall: No tenderness.  Abdominal:     General: Bowel sounds are normal. There is no distension.     Palpations: Abdomen is soft.  There is no mass.     Tenderness: There is no abdominal tenderness. There is no right CVA tenderness, left CVA tenderness, guarding or rebound.  Musculoskeletal:        General: No swelling or tenderness. Normal range of motion.     Cervical back: Normal range of motion. No rigidity or tenderness.     Right lower leg: No edema.     Left lower leg: No edema.     Comments: Arthritic changes on the fingers   Lymphadenopathy:     Cervical: No cervical adenopathy.  Skin:    General: Skin is warm and dry.     Coloration: Skin is not pale.     Findings: No bruising, erythema, lesion or rash.  Neurological:     Mental Status: She is alert and oriented to person, place, and time.     Cranial Nerves: No cranial nerve deficit.     Sensory: No sensory deficit.     Motor: No weakness.     Coordination: Coordination normal.     Gait: Gait normal.  Psychiatric:        Mood and Affect: Mood normal.        Speech: Speech normal.        Behavior: Behavior normal.        Thought Content: Thought content normal.        Judgment: Judgment normal.     Labs reviewed: Recent Labs    10/16/20 0946 03/05/21 1337  NA 142 141  K 3.7 3.8  CL 106 105  CO2 28 26  GLUCOSE 126* 105*  BUN 12 8  CREATININE 0.73 0.82  CALCIUM 9.2 9.6   Recent Labs    10/16/20 0946  AST 23  ALT 16  BILITOT 0.5  PROT 7.5   Recent Labs    10/16/20 0946 03/05/21 1337  WBC 4.0 3.3*  NEUTROABS 2,104 1,416*  HGB 11.9 12.3  HCT 37.3 37.2  MCV 89.2 87.9  PLT 249 288   Lab Results  Component Value Date   TSH 1.45 03/05/2021   Lab Results  Component Value Date   HGBA1C 7.2 (H) 03/05/2021   Lab Results  Component Value Date   CHOL 158 03/05/2021   HDL 45 (L) 03/05/2021   LDLCALC 93  03/05/2021   TRIG 100 03/05/2021   CHOLHDL 3.5 03/05/2021    Significant Diagnostic Results in last 30 days:  MM 3D SCREEN BREAST BILATERAL  Result Date: 08/12/2021 CLINICAL DATA:  Screening. EXAM: DIGITAL SCREENING BILATERAL MAMMOGRAM WITH TOMOSYNTHESIS AND CAD TECHNIQUE: Bilateral screening digital craniocaudal and mediolateral oblique mammograms were obtained. Bilateral screening digital breast tomosynthesis was performed. The images were evaluated with computer-aided detection. COMPARISON:  Previous exam(s). ACR Breast Density Category c: The breast tissue is heterogeneously dense, which may obscure small masses. FINDINGS: There are no findings suspicious for malignancy. IMPRESSION: No mammographic evidence of malignancy. A result letter of this screening mammogram will be mailed directly to the patient. RECOMMENDATION: Screening mammogram in one year. (Code:SM-B-01Y) BI-RADS CATEGORY  1: Negative. Electronically Signed   By: Ammie Ferrier M.D.   On: 08/12/2021 14:05    Assessment/Plan  1. Need for pneumococcal vaccination Pneumococcal conjugate vaccine 20 administered today by CMA no reaction reported.  - Pneumococcal conjugate vaccine 20-valent (Prevnar 20)  2. Essential hypertension Blood pressure stable -Continue on lisinopril and amlodipine -Continue on atorvastatin   3. Mixed hyperlipidemia -Advised to continue on dietary modification and encourage to exercise at least 3  times per week for 30 minutes -Continue on atorvastatin  4. RLS (restless legs syndrome) Stable on Requip  5. Chronic pain of right knee Over-the-counter analgesic effective  6. Type 2 diabetes mellitus without complication, without long-term current use of insulin (HCC) Lab Results  Component Value Date   HGBA1C 7.2 (H) 03/05/2021   Dietary control -Dietary modification and exercise discussed as above  7. History of pulmonary embolism As symptomatic -Continue on Xarelto no bleeding  reported  8. Pulmonary hypertension ([place]) Stable -Continue to follow-up with cardiologist since advised  9. Overweight with body mass index (BMI) 25.0-29.9 BMI 28.13 Has had some weight loss with her dietary modification  10. Body mass index 28.0-28.9, adult Continue dietary modification encouraged to avoid exercise at least 3 times per week for 30 minutes as tolerated  Family/ staff Communication: Reviewed plan of care with patient verbalized understanding   Labs/tests ordered:Has lab order in place to be drawn today.   Next Appointment : Return in about 6 months (around 03/12/2022) for medical mangement of chronic issues.Sandrea Hughs, NP

## 2021-09-10 NOTE — Patient Instructions (Addendum)
Please contact your local pharmacy, previous provider, or insurance carrier for vaccine/immunization records. Ensure that any procedures done outside of [facility] are faxed to [place] [phone] or you can sign release of records form at the front desk to keep your medical record updated.    - Please get Tdap and shingles vaccine at pharmacy.

## 2021-09-10 NOTE — Assessment & Plan Note (Signed)
Continue to follow up with cardiologist

## 2021-09-10 NOTE — Assessment & Plan Note (Signed)
Stable on Xarelto. 

## 2021-09-11 LAB — BASIC METABOLIC PANEL WITH GFR
BUN: 7 mg/dL (ref 7–25)
CO2: 26 mmol/L (ref 20–32)
Calcium: 9.2 mg/dL (ref 8.6–10.4)
Chloride: 107 mmol/L (ref 98–110)
Creat: 0.7 mg/dL (ref 0.60–1.00)
Glucose, Bld: 104 mg/dL — ABNORMAL HIGH (ref 65–99)
Potassium: 3.6 mmol/L (ref 3.5–5.3)
Sodium: 142 mmol/L (ref 135–146)
eGFR: 88 mL/min/{1.73_m2} (ref >=60)

## 2021-09-11 LAB — CBC WITH DIFFERENTIAL/PLATELET
Absolute Monocytes: 459 cells/uL (ref 200–950)
Basophils Absolute: 11 cells/uL (ref 0–200)
Basophils Relative: 0.3 %
Eosinophils Absolute: 49 cells/uL (ref 15–500)
Eosinophils Relative: 1.4 %
HCT: 36.6 % (ref 35.0–45.0)
Hemoglobin: 11.9 g/dL (ref 11.7–15.5)
Lymphs Abs: 1257 cells/uL (ref 850–3900)
MCH: 29.2 pg (ref 27.0–33.0)
MCHC: 32.5 g/dL (ref 32.0–36.0)
MCV: 89.7 fL (ref 80.0–100.0)
MPV: 10.4 fL (ref 7.5–12.5)
Monocytes Relative: 13.1 %
Neutro Abs: 1726 cells/uL (ref 1500–7800)
Neutrophils Relative %: 49.3 %
Platelets: 237 10*3/uL (ref 140–400)
RBC: 4.08 10*6/uL (ref 3.80–5.10)
RDW: 13.9 % (ref 11.0–15.0)
Total Lymphocyte: 35.9 %
WBC: 3.5 10*3/uL — ABNORMAL LOW (ref 3.8–10.8)

## 2021-09-11 LAB — LIPID PANEL
Cholesterol: 110 mg/dL (ref <200)
HDL: 47 mg/dL — ABNORMAL LOW (ref >=50)
LDL Cholesterol (Calc): 49 mg/dL (calc)
Non-HDL Cholesterol (Calc): 63 mg/dL (calc) (ref <130)
Total CHOL/HDL Ratio: 2.3 (calc) (ref <5.0)
Triglycerides: 65 mg/dL (ref <150)

## 2021-09-11 LAB — TSH: TSH: 1.3 mIU/L (ref 0.40–4.50)

## 2021-09-11 LAB — HEMOGLOBIN A1C
Hgb A1c MFr Bld: 6.9 % of total Hgb — ABNORMAL HIGH (ref <5.7)
Mean Plasma Glucose: 151 mg/dL
eAG (mmol/L): 8.4 mmol/L

## 2021-10-25 ENCOUNTER — Other Ambulatory Visit: Payer: Self-pay

## 2021-10-25 ENCOUNTER — Encounter (HOSPITAL_BASED_OUTPATIENT_CLINIC_OR_DEPARTMENT_OTHER): Payer: Self-pay

## 2021-10-25 ENCOUNTER — Emergency Department (HOSPITAL_BASED_OUTPATIENT_CLINIC_OR_DEPARTMENT_OTHER)
Admission: EM | Admit: 2021-10-25 | Discharge: 2021-10-25 | Disposition: A | Discharge status: Home / Self Care | Payer: PPO | Attending: Emergency Medicine | Admitting: Emergency Medicine

## 2021-10-25 DIAGNOSIS — S61411A Laceration without foreign body of right hand, initial encounter: Secondary | ICD-10-CM | POA: Diagnosis not present

## 2021-10-25 DIAGNOSIS — W268XXA Contact with other sharp object(s), not elsewhere classified, initial encounter: Secondary | ICD-10-CM | POA: Insufficient documentation

## 2021-10-25 DIAGNOSIS — S6991XA Unspecified injury of right wrist, hand and finger(s), initial encounter: Secondary | ICD-10-CM | POA: Diagnosis present

## 2021-10-25 DIAGNOSIS — Z7901 Long term (current) use of anticoagulants: Secondary | ICD-10-CM | POA: Diagnosis not present

## 2021-10-25 DIAGNOSIS — Z79899 Other long term (current) drug therapy: Secondary | ICD-10-CM | POA: Insufficient documentation

## 2021-10-25 NOTE — ED Notes (Signed)
Pt verbalizes understanding of discharge instructions. Opportunity for questioning and answers were provided. Pt discharged from ED to home with daughter.    

## 2021-10-25 NOTE — Discharge Instructions (Addendum)
I would change bandage on the palm once daily, apply thin layer of Polysporin, Neosporin to the affected area.  Please monitor for signs of infections including worsening redness, pain, swelling, pus draining from the affected area and return for further treatment if you note any of these signs of symptoms.

## 2021-10-25 NOTE — ED Triage Notes (Signed)
Pt presents to the ED with daughter (jeanette) POV from home. Small laceration noted to right palm near wrist from a broken broom. Pt is on Xarelto and daughter states that they had some issues getting the bleeding to stop. Bleeding controlled at this time. Unknown when last teatnus was, but daughter reports that she thinks it's been within the last three years.

## 2021-10-25 NOTE — ED Provider Notes (Signed)
[facility] EMERGENCY DEPT Provider Note   CSN: [id number] Arrival date & time: 10/25/21  1843     History  Chief Complaint  Patient presents with   Laceration    Catherine Snyder is a 81 y.o. female with past medical history significant for previous pulmonary embolism who is currently anticoagulated on Xarelto who presents with concern for a small laceration of the right palm near her wrist from a broken broom.  Laceration occurred earlier today.  Daughter thinks that last tetanus was in the last 3 years.  Concern by patient and daughter due to excessive bleeding, difficulty to get bleeding to stop initially.  There is no bleeding at time of my evaluation.   Laceration      Home Medications Prior to Admission medications   Medication Sig Start Date End Date Taking? Authorizing Provider  acetaminophen (TYLENOL) 500 MG tablet Take 500 mg by mouth every 6 (six) hours as needed for moderate pain.    [provider]  amLODipine (NORVASC) 10 MG tablet Take 1 tablet (10 mg total) by mouth daily. 02/25/21   Ngetich, Dinah C, NP  Ascorbic Acid (VITAMIN C) 1000 MG tablet Take 1,000 mg by mouth daily.    [provider]  atorvastatin (LIPITOR) 80 MG tablet TAKE ONE TABLET BY MOUTH ONCE DAILY AT 6PM 08/31/21   Ngetich, Dinah C, NP  Cholecalciferol (VITAMIN D) 2000 units tablet Take 2,000 Units by mouth daily.    [provider]  diclofenac Sodium (VOLTAREN) 1 % GEL Apply 2 g topically as needed.    [provider]  lisinopril (ZESTRIL) 2.5 MG tablet TAKE 1 TABLET BY MOUTH EVERY DAY 06/08/21   Ngetich, Dinah C, NP  nitroGLYCERIN (NITROSTAT) 0.4 MG SL tablet Place 1 tablet (0.4 mg total) under the tongue every 5 (five) minutes x 3 doses as needed for chest pain. 02/11/16   Allie Bossier, MD  oxymetazoline (AFRIN) 0.05 % nasal spray Place 1 spray into both nostrils 2 (two) times daily. 06/07/19   Ngetich, Dinah C, NP  rivaroxaban (XARELTO) 20 MG  TABS tablet TAKE ONE TABLET BY MOUTH ONCE DAILY WITH SUPPER 03/05/21   Ngetich, Dinah C, NP  rOPINIRole (REQUIP) 0.5 MG tablet TAKE 1 TABLET BY MOUTH AT BEDTIME. 08/31/21   Ngetich, Nelda Bucks, NP      Allergies    Patient has no known allergies.    Review of Systems   Review of Systems  Skin:  Positive for wound.  All other systems reviewed and are negative.   Physical Exam Updated Vital Signs BP 137/85   Pulse (!) 53   Temp 99.1 F (37.3 C) (Oral)   Resp 18   Ht '5\' 3"'$  (1.6 m)   Wt 68 kg   SpO2 99%   BMI 26.57 kg/m  Physical Exam Vitals and nursing note reviewed.  Constitutional:      General: She is not in acute distress.    Appearance: Normal appearance.  HENT:     Head: Normocephalic and atraumatic.  Eyes:     General:        Right eye: No discharge.        Left eye: No discharge.  Cardiovascular:     Rate and Rhythm: Normal rate and regular rhythm.  Pulmonary:     Effort: Pulmonary effort is normal. No respiratory distress.  Musculoskeletal:        General: No deformity.  Skin:    General: Skin is warm and  dry.     Comments: Very small, very shallow laceration versus abrasion noted on the volar surface of the right palm close to the wrist border.  No active bleeding at time my evaluation.  No evidence of retained foreign body.  Wound explored through full range of motion, it does not separated, poorly aligned or contaminated.  Neurological:     Mental Status: She is alert and oriented to person, place, and time.  Psychiatric:        Mood and Affect: Mood normal.        Behavior: Behavior normal.     ED Results / Procedures / Treatments   Labs (all labs ordered are listed, but only abnormal results are displayed) Labs Reviewed - No data to display  EKG None  Radiology No results found.  Procedures Procedures    Medications Ordered in ED Medications - No data to display  ED Course/ Medical Decision Making/ A&P                           Medical  Decision Making  This is a well-appearing 81 yo Female presents due to concern for right palm/wrist injury earlier today.  Patient is on Xarelto also had some significant bleeding of the injury.  On my physical exam and note a small laceration versus abrasion that does not require any suture or skin glue repair.  It is not bleeding at this time.  We will clean and apply a new pressure dressing.  Encouraged repeat pressure dressings, antibacterial ointment to prevent infection.  Patient is up-to-date on her tetanus.  No evidence of foreign body, patient is discharged in stable condition at this time, encouraged to return if any signs of wound infection noted. Final Clinical Impression(s) / ED Diagnoses Final diagnoses:  Laceration of right palm, initial encounter    Rx / DC Orders ED Discharge Orders     None         Dorien Chihuahua 10/25/21 2207    Pattricia Boss, MD 10/27/21 1239

## 2021-10-27 ENCOUNTER — Other Ambulatory Visit: Payer: Self-pay | Admitting: Family

## 2021-10-27 DIAGNOSIS — I2699 Other pulmonary embolism without acute cor pulmonale: Secondary | ICD-10-CM

## 2022-03-07 ENCOUNTER — Emergency Department (HOSPITAL_BASED_OUTPATIENT_CLINIC_OR_DEPARTMENT_OTHER): Payer: PPO | Admitting: Radiology

## 2022-03-07 ENCOUNTER — Emergency Department (HOSPITAL_BASED_OUTPATIENT_CLINIC_OR_DEPARTMENT_OTHER)
Admission: EM | Admit: 2022-03-07 | Discharge: 2022-03-07 | Disposition: A | Discharge status: Home / Self Care | Payer: PPO | Attending: Emergency Medicine | Admitting: Emergency Medicine

## 2022-03-07 DIAGNOSIS — Z7901 Long term (current) use of anticoagulants: Secondary | ICD-10-CM | POA: Diagnosis not present

## 2022-03-07 DIAGNOSIS — S61212A Laceration without foreign body of right middle finger without damage to nail, initial encounter: Secondary | ICD-10-CM | POA: Diagnosis not present

## 2022-03-07 DIAGNOSIS — Z79899 Other long term (current) drug therapy: Secondary | ICD-10-CM | POA: Diagnosis not present

## 2022-03-07 DIAGNOSIS — W010XXA Fall on same level from slipping, tripping and stumbling without subsequent striking against object, initial encounter: Secondary | ICD-10-CM | POA: Diagnosis not present

## 2022-03-07 DIAGNOSIS — Y9389 Activity, other specified: Secondary | ICD-10-CM | POA: Insufficient documentation

## 2022-03-07 DIAGNOSIS — S6991XA Unspecified injury of right wrist, hand and finger(s), initial encounter: Secondary | ICD-10-CM | POA: Diagnosis present

## 2022-03-07 DIAGNOSIS — M151 Heberden's nodes (with arthropathy): Secondary | ICD-10-CM | POA: Diagnosis not present

## 2022-03-07 MED ORDER — LIDOCAINE HCL (PF) 1 % IJ SOLN
10.0000 mL | Freq: Once | INTRAMUSCULAR | Status: AC
  Administered 2022-03-07: 10 mL
  Filled 2022-03-07: qty 10

## 2022-03-07 NOTE — ED Notes (Signed)
Discharge paperwork given and verbally understood. 

## 2022-03-07 NOTE — ED Provider Notes (Signed)
[facility] EMERGENCY DEPT Provider Note   CSN: [id number] Arrival date & time: 03/07/22  1027     History  Chief Complaint  Patient presents with   Finger Injury    Catherine Snyder is a 81 y.o. female.  Currently on Xarelto who presents to the ED for evaluation of a fall.  She states she was playing with her grandchild outside [address] earlier today when she tripped over the curb and fell forward.  She landed on her hands and knees.  Did not hit her head or lose consciousness.  Does take a blood thinner.  States she cut her right middle finger when she fell.  Denies other injuries or pains other body parts.  Last tetanus vaccine was 2 years ago.  Complaining of pain to the right middle finger with swelling and laceration on the dorsal aspect.  Has full range of motion of finger.  HPI     Home Medications Prior to Admission medications   Medication Sig Start Date End Date Taking? Authorizing Provider  acetaminophen (TYLENOL) 500 MG tablet Take 500 mg by mouth every 6 (six) hours as needed for moderate pain.    [provider]  amLODipine (NORVASC) 10 MG tablet Take 1 tablet (10 mg total) by mouth daily. 02/25/21   Ngetich, Dinah C, NP  Ascorbic Acid (VITAMIN C) 1000 MG tablet Take 1,000 mg by mouth daily.    [provider]  atorvastatin (LIPITOR) 80 MG tablet TAKE ONE TABLET BY MOUTH ONCE DAILY AT 6PM 08/31/21   Ngetich, Dinah C, NP  Cholecalciferol (VITAMIN D) 2000 units tablet Take 2,000 Units by mouth daily.    [provider]  diclofenac Sodium (VOLTAREN) 1 % GEL Apply 2 g topically as needed.    [provider]  lisinopril (ZESTRIL) 2.5 MG tablet TAKE 1 TABLET BY MOUTH EVERY DAY 06/08/21   Ngetich, Dinah C, NP  nitroGLYCERIN (NITROSTAT) 0.4 MG SL tablet Place 1 tablet (0.4 mg total) under the tongue every 5 (five) minutes x 3 doses as needed for chest pain. 02/11/16   Allie Bossier, MD  oxymetazoline (AFRIN) 0.05 % nasal spray  Place 1 spray into both nostrils 2 (two) times daily. 06/07/19   Ngetich, Dinah C, NP  rivaroxaban (XARELTO) 20 MG TABS tablet TAKE ONE TABLET BY MOUTH ONCE DAILY WITH SUPPER 10/27/21   Ngetich, Dinah C, NP  rOPINIRole (REQUIP) 0.5 MG tablet TAKE 1 TABLET BY MOUTH AT BEDTIME. 08/31/21   Ngetich, Nelda Bucks, NP      Allergies    Patient has no known allergies.    Review of Systems   Review of Systems  Skin:  Positive for wound.  All other systems reviewed and are negative.   Physical Exam Updated Vital Signs BP 136/76 (BP Location: Right Arm)   Pulse 60   Temp 98.5 F (36.9 C)   Resp 16   SpO2 98%  Physical Exam Vitals and nursing note reviewed.  Constitutional:      General: She is not in acute distress.    Appearance: Normal appearance. She is well-developed and normal weight. She is not ill-appearing.  HENT:     Head: Normocephalic and atraumatic.  Eyes:     Conjunctiva/sclera: Conjunctivae normal.  Cardiovascular:     Rate and Rhythm: Normal rate and regular rhythm.     Heart sounds: No murmur heard. Pulmonary:     Effort: Pulmonary effort is normal. No respiratory distress.     Breath  sounds: Normal breath sounds.  Abdominal:     General: Abdomen is flat.     Palpations: Abdomen is soft.     Tenderness: There is no abdominal tenderness.  Musculoskeletal:        General: Swelling (R middle finger) present. Normal range of motion.     Cervical back: Neck supple.     Comments: Full ROM of affected extremity. Sensation and capillary refill in tact.  Skin:    General: Skin is warm and dry.     Capillary Refill: Capillary refill takes less than 2 seconds.     Findings: Lesion present.     Comments: 3 cm laceration to dorsum of right middle finger just proximal to the PIP joint  Neurological:     Mental Status: She is alert and oriented to person, place, and time.  Psychiatric:        Mood and Affect: Mood normal.        Behavior: Behavior normal.     ED Results /  Procedures / Treatments   Labs (all labs ordered are listed, but only abnormal results are displayed) Labs Reviewed - No data to display  EKG None  Radiology DG Finger Middle Right  Result Date: 03/07/2022 CLINICAL DATA:  Tripped and fell cutting the right third finger. EXAM: RIGHT MIDDLE FINGER 2+V COMPARISON:  None Available. FINDINGS: No fracture.  No bone lesion. There are marked osteoarthritic changes at the D IP joint of the third finger, mildly at the PIP joint and third metacarpal phalangeal joint. No radiopaque foreign body. IMPRESSION: 1. No fracture or acute finding.  No radiopaque foreign body. 2. Multiple joint osteoarthritis. Electronically Signed   By: Lajean Manes M.D.   On: 03/07/2022 11:43    Procedures .Marland KitchenLaceration Repair  Date/Time: 03/07/2022 2:49 PM  Performed by: Roylene Reason, PA-C Authorized by: Roylene Reason, PA-C   Consent:    Consent obtained:  Verbal   Consent given by:  Patient   Risks discussed:  Infection, pain and poor wound healing   Alternatives discussed:  No treatment Universal protocol:    Procedure explained and questions answered to patient or proxy's satisfaction: yes     Relevant documents present and verified: yes     Test results available: yes     Imaging studies available: yes     Immediately prior to procedure, a time out was called: yes     Patient identity confirmed:  Verbally with patient Anesthesia:    Anesthesia method:  Local infiltration   Local anesthetic:  Lidocaine 1% w/o epi Laceration details:    Location:  Finger   Finger location:  R long finger   Length (cm):  3 Pre-procedure details:    Preparation:  Patient was prepped and draped in usual sterile fashion Exploration:    Hemostasis achieved with:  Tourniquet   Imaging obtained: x-ray     Imaging outcome: foreign body not noted     Wound exploration: wound explored through full range of motion   Treatment:    Area cleansed with:  Soap and  water   Amount of cleaning:  Standard   Irrigation solution:  Tap water   Irrigation method:  Tap Skin repair:    Repair method:  Sutures   Suture size:  5-0   Suture material:  Nylon   Suture technique:  Simple interrupted   Number of sutures:  5 Approximation:    Approximation:  Close Repair type:    Repair type:  Simple Post-procedure details:    Dressing:  Non-adherent dressing   Procedure completion:  Tolerated well, no immediate complications     Medications Ordered in ED Medications  lidocaine (PF) (XYLOCAINE) 1 % injection 10 mL (10 mLs Infiltration Given by Other 03/07/22 1400)    ED Course/ Medical Decision Making/ A&P                           Medical Decision Making Amount and/or Complexity of Data Reviewed Radiology: ordered.  Risk Prescription drug management.  This patient presents to the ED for concern of finger laceration, this involves an extensive number of treatment options   Co morbidities that complicate the patient evaluation  current xarelto use  My initial workup includes finger x ray, laceration repair  Additional history obtained from: Nursing notes from this visit.  I ordered imaging studies including x ray right middle finger I independently visualized and interpreted imaging which showed no fracture or foreign body I agree with the radiologist interpretation  Afebrile, hemodynamically stable.  81 year old female presenting to ED for evaluation of laceration to the right middle finger.  No other complaints.  Neurovascular status of the finger is intact.  X-ray negative for fracture or foreign body.  Laceration was repaired as above and without incident.  Bleeding was controlled with direct pressure.  Patient was given information regarding return for suture removal, signs and symptoms of infection.  was given return precautions.  Stable at discharge.  At this time there does not appear to be any evidence of an acute emergency medical  condition and the patient appears stable for discharge with appropriate outpatient follow up. Diagnosis was discussed with patient who verbalizes understanding of care plan and is agreeable to discharge. I have discussed return precautions with patient and daughter who verbalizes understanding. Patient encouraged to follow-up with their PCP within 1 week. All questions answered.  Note: Portions of this report may have been transcribed using voice recognition software. Every effort was made to ensure accuracy; however, inadvertent computerized transcription errors may still be present.         Final Clinical Impression(s) / ED Diagnoses Final diagnoses:  Laceration of right middle finger without foreign body without damage to nail, initial encounter    Rx / DC Orders ED Discharge Orders     None         Roylene Reason, Hershal Coria 03/07/22 1450    Hayden Rasmussen, MD 03/07/22 [phone]

## 2022-03-07 NOTE — Discharge Instructions (Addendum)
You have been seen today for your complaint of finger laceration. Your imaging was negative for fracture or retained foreign body. Your discharge medications include tylenol for pain. You may take up to 1000 mg of tylenol every 6 hours. Home care instructions are as follows:  Keep the area clean and dry for 24 hours.  You may clean with warm soapy water once daily after that. Follow up with: Your primary care provider in 7 to 10 days for suture removal Please seek immediate medical care if you develop any of the following symptoms: You develop severe swelling around the wound. Your pain suddenly increases and is severe. You develop painful lumps near the wound or on skin anywhere else on your body. You have a red streak going away from your wound. The wound is on your hand or foot, and you cannot properly move a finger or toe. The wound is on your hand or foot, and you notice that your fingers or toes look pale or bluish. At this time there does not appear to be the presence of an emergent medical condition, however there is always the potential for conditions to change. Please read and follow the below instructions.  Do not take your medicine if  develop an itchy rash, swelling in your mouth or lips, or difficulty breathing; call 911 and seek immediate emergency medical attention if this occurs.  You may review your lab tests and imaging results in their entirety on your MyChart account.  Please discuss all results of fully with your primary care provider and other specialist at your follow-up visit.  Note: Portions of this text may have been transcribed using voice recognition software. Every effort was made to ensure accuracy; however, inadvertent computerized transcription errors may still be present.

## 2022-03-07 NOTE — ED Triage Notes (Signed)
Pt had mechanical trip and fall, landing on right hand, cutting right 3rd finger.  Adipose tissue exposed at site of injury, pt on blood thinners and bleeding is moderate from site of wound.  Pt denies head or other injury from fall.  Pressure dressing applied in triage.

## 2022-03-11 ENCOUNTER — Telehealth: Payer: Self-pay

## 2022-03-11 NOTE — Telephone Encounter (Signed)
     Patient  visit on 12/24  at [facility]  Patient is doing ok and no medications were given follow up with PCP or other provider 12/29    Have you been able to follow up with your primary care physician? Yes   The patient was or was not able to obtain any needed medicine or equipment. None   Are there diet recommendations that you are having difficulty following? Na    Patient expresses understanding of discharge instructions and education provided has no other needs at this time.  Yes        [place], [facility], Care Management  [phone] [address], [place], [place] [address] Phone: [phone] Email: [email or web address]

## 2022-03-12 ENCOUNTER — Encounter: Payer: PPO | Admitting: Family

## 2022-03-18 ENCOUNTER — Encounter: Payer: PPO | Admitting: Family

## 2022-03-18 ENCOUNTER — Ambulatory Visit: Payer: PPO | Admitting: Family

## 2022-03-22 ENCOUNTER — Other Ambulatory Visit: Payer: Self-pay

## 2022-03-22 ENCOUNTER — Emergency Department (HOSPITAL_BASED_OUTPATIENT_CLINIC_OR_DEPARTMENT_OTHER)
Admission: EM | Admit: 2022-03-22 | Discharge: 2022-03-22 | Disposition: A | Discharge status: Home / Self Care | Payer: PPO | Attending: Emergency Medicine | Admitting: Emergency Medicine

## 2022-03-22 ENCOUNTER — Encounter (HOSPITAL_BASED_OUTPATIENT_CLINIC_OR_DEPARTMENT_OTHER): Payer: Self-pay

## 2022-03-22 DIAGNOSIS — Z4802 Encounter for removal of sutures: Secondary | ICD-10-CM | POA: Diagnosis not present

## 2022-03-22 DIAGNOSIS — Z7901 Long term (current) use of anticoagulants: Secondary | ICD-10-CM | POA: Diagnosis not present

## 2022-03-22 DIAGNOSIS — S61212D Laceration without foreign body of right middle finger without damage to nail, subsequent encounter: Secondary | ICD-10-CM | POA: Diagnosis not present

## 2022-03-22 NOTE — Discharge Instructions (Signed)
Thank you for allowing me to be part of your care today.  Continue to keep your wound clean and you may bandage it as needed.  Have your PCP re-evaluate the swelling at your appointment later this week.  Return to the ER if you develop worsening symptoms or have any new concerns.

## 2022-03-22 NOTE — ED Triage Notes (Addendum)
Pt arrives POV for suture removal from right long finger.  5 Sutures removed in triage.

## 2022-03-22 NOTE — ED Provider Notes (Signed)
[facility] EMERGENCY DEPT Provider Note   CSN: [id number] Arrival date & time: 03/22/22  1829     History  Chief Complaint  Patient presents with   Suture / Staple Removal    Catherine Snyder is a 82 y.o. female presents to the ED for removal of sutures to her right long finger.  5 sutures were removed by RN in triage.  Patient reports it has been healing well.  She has not had any bleeding or abnormal discharge from the finger.  She states the finger is still swollen and tender, but she has normal sensation and can bend her finger some.  Denies fever, chills, redness or increased warmth to the site of repair.         Home Medications Prior to Admission medications   Medication Sig Start Date End Date Taking? Authorizing Provider  acetaminophen (TYLENOL) 500 MG tablet Take 500 mg by mouth every 6 (six) hours as needed for moderate pain.    [provider]  amLODipine (NORVASC) 10 MG tablet Take 1 tablet (10 mg total) by mouth daily. 02/25/21   Ngetich, Dinah C, NP  Ascorbic Acid (VITAMIN C) 1000 MG tablet Take 1,000 mg by mouth daily.    [provider]  atorvastatin (LIPITOR) 80 MG tablet TAKE ONE TABLET BY MOUTH ONCE DAILY AT 6PM 08/31/21   Ngetich, Dinah C, NP  Cholecalciferol (VITAMIN D) 2000 units tablet Take 2,000 Units by mouth daily.    [provider]  diclofenac Sodium (VOLTAREN) 1 % GEL Apply 2 g topically as needed.    [provider]  lisinopril (ZESTRIL) 2.5 MG tablet TAKE 1 TABLET BY MOUTH EVERY DAY 06/08/21   Ngetich, Dinah C, NP  nitroGLYCERIN (NITROSTAT) 0.4 MG SL tablet Place 1 tablet (0.4 mg total) under the tongue every 5 (five) minutes x 3 doses as needed for chest pain. 02/11/16   Allie Bossier, MD  oxymetazoline (AFRIN) 0.05 % nasal spray Place 1 spray into both nostrils 2 (two) times daily. 06/07/19   Ngetich, Dinah C, NP  rivaroxaban (XARELTO) 20 MG TABS tablet TAKE ONE TABLET BY MOUTH ONCE DAILY WITH SUPPER  10/27/21   Ngetich, Dinah C, NP  rOPINIRole (REQUIP) 0.5 MG tablet TAKE 1 TABLET BY MOUTH AT BEDTIME. 08/31/21   Ngetich, Nelda Bucks, NP      Allergies    Patient has no known allergies.    Review of Systems   Review of Systems  Skin:  Positive for wound.       Suture removal    Physical Exam Updated Vital Signs BP 123/79   Temp 98.1 F (36.7 C) (Oral)   Resp 18   Ht '5\' 3"'$  (1.6 m)   Wt 68 kg   SpO2 99%   BMI 26.56 kg/m  Physical Exam Vitals and nursing note reviewed.  Constitutional:      General: She is not in acute distress.    Appearance: Normal appearance. She is not ill-appearing or diaphoretic.  Pulmonary:     Effort: Pulmonary effort is normal.  Musculoskeletal:     Right hand: Tenderness present. No bony tenderness. Decreased range of motion. Normal strength. Normal sensation. Normal capillary refill. Normal pulse.     Comments: Mild swelling to right long finger with healing laceration site to the palmar side.  Sutures were removed in triage.  Finger is neurovascularly intact.  She is able to bend it, mildly limited ROM due to swelling of the finger.  No  tenderness to palpation of finger joints.    Neurological:     Mental Status: She is alert. Mental status is at baseline.  Psychiatric:        Mood and Affect: Mood normal.        Behavior: Behavior normal.     ED Results / Procedures / Treatments   Labs (all labs ordered are listed, but only abnormal results are displayed) Labs Reviewed - No data to display  EKG None  Radiology No results found.  Procedures Procedures    Medications Ordered in ED Medications - No data to display  ED Course/ Medical Decision Making/ A&P                           Medical Decision Making  Patient presents to ED for suture removal.  She reports she has been keeping the area clean and has been changing the bandage regularly.  She denies bleeding or drainage from the laceration site.  5 sutures were removed in triage by  RN.  Right long finger is swollen, but without erythema, bleeding, or drainage.  Mildly limited ROM due to swelling.  Finger is neurovascularly intact.  Discussed with patient continued wound care and supportive care measures for finger swelling at home.  She verbally expressed her understanding.  Patient will have PCP follow-up later this week and will have it re-evaluated at that time.          Final Clinical Impression(s) / ED Diagnoses Final diagnoses:  Visit for suture removal    Rx / DC Orders ED Discharge Orders     None         Pat Kocher, [place] 03/22/22 1915    Fredia Sorrow, MD 03/26/22 2036

## 2022-03-23 ENCOUNTER — Encounter: Payer: PPO | Admitting: Family

## 2022-03-23 NOTE — Progress Notes (Signed)
  This encounter was created in error - please disregard. No show 

## 2022-03-25 ENCOUNTER — Other Ambulatory Visit: Payer: Self-pay | Admitting: Family

## 2022-03-25 NOTE — Progress Notes (Signed)
  This encounter was created in error - please disregard. No show 

## 2022-03-28 NOTE — Progress Notes (Signed)
This encounter was created in error - please disregard. Provider was out of office. 

## 2022-04-19 ENCOUNTER — Other Ambulatory Visit: Payer: Self-pay | Admitting: Family

## 2022-04-19 DIAGNOSIS — G2581 Restless legs syndrome: Secondary | ICD-10-CM

## 2022-06-10 ENCOUNTER — Other Ambulatory Visit: Payer: Self-pay | Admitting: Family

## 2022-06-10 DIAGNOSIS — I10 Essential (primary) hypertension: Secondary | ICD-10-CM

## 2022-06-15 ENCOUNTER — Ambulatory Visit (INDEPENDENT_AMBULATORY_CARE_PROVIDER_SITE_OTHER): Payer: PPO | Admitting: Family

## 2022-06-15 ENCOUNTER — Encounter: Payer: Self-pay | Admitting: Family

## 2022-06-15 VITALS — BP 128/78 | HR 64 | Temp 97.4°F | Resp 18 | Ht 63.0 in | Wt 160.0 lb

## 2022-06-15 DIAGNOSIS — E782 Mixed hyperlipidemia: Secondary | ICD-10-CM

## 2022-06-15 DIAGNOSIS — Z86711 Personal history of pulmonary embolism: Secondary | ICD-10-CM

## 2022-06-15 DIAGNOSIS — E119 Type 2 diabetes mellitus without complications: Secondary | ICD-10-CM

## 2022-06-15 DIAGNOSIS — G2581 Restless legs syndrome: Secondary | ICD-10-CM

## 2022-06-15 DIAGNOSIS — I10 Essential (primary) hypertension: Secondary | ICD-10-CM | POA: Diagnosis not present

## 2022-06-15 DIAGNOSIS — I272 Pulmonary hypertension, unspecified: Secondary | ICD-10-CM | POA: Diagnosis not present

## 2022-06-15 MED ORDER — ROPINIROLE HCL 0.5 MG PO TABS: ORAL_TABLET | ORAL | 2 refills | Status: DC

## 2022-06-15 MED ORDER — LISINOPRIL 2.5 MG PO TABS: 2.5000 mg | ORAL_TABLET | Freq: Every day | ORAL | 1 refills | Status: DC

## 2022-06-15 NOTE — Progress Notes (Signed)
Provider: Marlowe Sax FNP-C   Iley Breeden, Nelda Bucks, NP  Patient Care Team: Idil Maslanka, Nelda Bucks, NP as PCP - General (Family Medicine)  Extended Emergency Contact Information Primary Emergency Contact: Cashatt,Jeanette Address: [address]           [place], [place] [address] [place] Phone: [phone] Mobile Phone: [phone] Relation: Daughter Secondary Emergency Contact: Chiquita Loth [place] Phone: [phone] Mobile Phone: [phone] Relation: Son  Code Status:  DNR Goals of care: Advanced Directive information    03/22/2022    6:44 PM  Advanced Directives  Does Patient Have a Medical Advance Directive? No     Chief Complaint  Patient presents with   Medical Management of Chronic Issues    6 month follow up.    Health Maintenance    Discuss the need for Eye exam, Foot exam, Hemoglobin A1C, and Diabetic kidney evaluation.    Immunizations    Discuss the need for DTAP vaccine, and Shingrix vaccine.     HPI:  Pt is a 82 y.o. female seen today for 6 month for medical management of chronic diseases. She is here with her niece, She states no acute issues.states missed her visit due to bad weather.   Due for Tdap and shingles vaccine made aware to get vaccine at the pharmacy.  No home CBG or B/p readings for review.Reading normal today.she denies any headache,dizziness,vision changes,fatigue,chest tightness,palpitation,chest pain or shortness of breath.    She reports no signs of bleeding on Xarelto for previous PE.     Past Medical History:  Diagnosis Date   Absolute anemia    CAD in native artery    40% LAD at cath 02/04/16   Chronic anticoagulation 03/02/2016   Demand ischemia of myocardium - Not NSTEMI 02/03/2016   Dyslipidemia 03/02/2016   Dyspnea on exertion    Elevated troponin    Essential hypertension 11/27/2014   Frequency of urination    Hematuria    History of bladder cancer    a. s/p  resection in 06/2015   MCI (mild cognitive impairment) 06/30/2020   Nocturia    Primary osteoarthritis of first carpometacarpal joint of left hand 04/22/2017   Pulmonary hypertension    Pulmonary hypertension due to thromboembolism 02/05/2016   RLS (restless legs syndrome) 05/21/2016   Saddle embolus of pulmonary artery without acute cor pulmonale    Acute saddle PE treated with direct thrombolytics 02/07/16   Wears glasses    Wears partial dentures    upper   Past Surgical History:  Procedure Laterality Date   CARDIAC CATHETERIZATION N/A 02/04/2016   Procedure: RIGHT/LEFT HEART CATH AND CORONARY ANGIOGRAPHY;  Surgeon: Troy Sine, MD;  Location: [facility];  Service: Cardiovascular;  Laterality: N/A;   CYSTOSCOPY W/ RETROGRADES Bilateral 07/07/2015   Procedure: CYSTOSCOPY WITH RETROGRADE PYELOGRAM;  Surgeon: Cleon Gustin, MD;  Location: [facility];  Service: Urology;  Laterality: Bilateral;   IR GENERIC HISTORICAL  02/07/2016   IR ANGIOGRAM SELECTIVE EACH ADDITIONAL VESSEL 02/07/2016 Jacqulynn Cadet, MD [facility]   IR GENERIC HISTORICAL  02/07/2016   IR US GUIDE VASC ACCESS RIGHT 02/07/2016 Jacqulynn Cadet, MD [facility]   IR GENERIC HISTORICAL  02/07/2016   IR ANGIOGRAM PULMONARY BILATERAL SELECTIVE 02/07/2016 Jacqulynn Cadet, MD [facility]   IR GENERIC HISTORICAL  02/07/2016   IR INFUSION THROMBOL ARTERIAL INITIAL (MS) 02/07/2016 Jacqulynn Cadet, MD [facility]   IR GENERIC HISTORICAL  02/07/2016  IR INFUSION THROMBOL ARTERIAL INITIAL (MS) 02/07/2016 Jacqulynn Cadet, MD [facility]   IR GENERIC HISTORICAL  02/07/2016   IR ANGIOGRAM SELECTIVE EACH ADDITIONAL VESSEL 02/07/2016 Jacqulynn Cadet, MD [facility]   NO PAST SURGERIES     TRANSURETHRAL RESECTION OF BLADDER TUMOR WITH GYRUS (TURBT-GYRUS) N/A 07/07/2015   Procedure: TRANSURETHRAL RESECTION OF BLADDER TUMOR WITH GYRUS (TURBT-GYRUS);  Surgeon: Cleon Gustin, MD;   Location: [facility];  Service: Urology;  Laterality: N/A;    No Known Allergies  Allergies as of 06/15/2022   No Known Allergies      Medication List        Accurate as of June 15, 2022  1:11 PM. If you have any questions, ask your nurse or doctor.          STOP taking these medications    lisinopril 2.5 MG tablet Commonly known as: ZESTRIL Stopped by: Nelda Bucks Emojean Gertz, NP   nitroGLYCERIN 0.4 MG SL tablet Commonly known as: NITROSTAT Stopped by: Nelda Bucks Miamarie Moll, NP   oxymetazoline 0.05 % nasal spray Commonly known as: AFRIN Stopped by: Sandrea Hughs, NP   Vitamin D 50 MCG (2000 UT) tablet Stopped by: Sandrea Hughs, NP       TAKE these medications    acetaminophen 500 MG tablet Commonly known as: TYLENOL Take 500 mg by mouth every 6 (six) hours as needed for moderate pain.   amLODipine 10 MG tablet Commonly known as: NORVASC TAKE 1 TABLET BY MOUTH EVERY DAY   atorvastatin 80 MG tablet Commonly known as: LIPITOR TAKE ONE TABLET BY MOUTH ONCE DAILY AT 6PM   diclofenac Sodium 1 % Gel Commonly known as: VOLTAREN Apply 2 g topically as needed.   rOPINIRole 0.5 MG tablet Commonly known as: REQUIP TAKE 1 TABLET BY MOUTH EVERYDAY AT BEDTIME   vitamin C 1000 MG tablet Take 1,000 mg by mouth daily.   Xarelto 20 MG Tabs tablet Generic drug: rivaroxaban TAKE ONE TABLET BY MOUTH ONCE DAILY WITH SUPPER        Review of Systems  Constitutional:  Negative for appetite change, chills, fatigue, fever and unexpected weight change.  HENT:  Negative for congestion, dental problem, ear discharge, ear pain, facial swelling, hearing loss, nosebleeds, postnasal drip, rhinorrhea, sinus pressure, sinus pain, sneezing, sore throat, tinnitus and trouble swallowing.   Eyes:  Negative for pain, discharge, redness, itching and visual disturbance.  Respiratory:  Negative for cough, chest tightness, shortness of breath and wheezing.   Cardiovascular:   Negative for chest pain, palpitations and leg swelling.  Gastrointestinal:  Negative for abdominal distention, abdominal pain, blood in stool, constipation, diarrhea, nausea and vomiting.  Endocrine: Negative for cold intolerance, heat intolerance, polydipsia, polyphagia and polyuria.  Genitourinary:  Negative for difficulty urinating, dysuria, flank pain, frequency and urgency.  Musculoskeletal:  Positive for arthralgias and back pain. Negative for gait problem, joint swelling, myalgias, neck pain and neck stiffness.  Skin:  Negative for color change, pallor, rash and wound.  Neurological:  Negative for dizziness, syncope, speech difficulty, weakness, light-headedness, numbness and headaches.  Hematological:  Does not bruise/bleed easily.  Psychiatric/Behavioral:  Negative for agitation, behavioral problems, confusion, hallucinations, self-injury, sleep disturbance and suicidal ideas. The patient is not nervous/anxious.     Immunization History  Administered Date(s) Administered   Influenza,inj,Quad PF,6+ Mos 11/27/2014   PNEUMOCOCCAL CONJUGATE-20 09/10/2021   Pneumococcal Conjugate-13 06/13/2017   Pertinent  Health Maintenance Due  Topic Date Due   FOOT EXAM  Never  done   OPHTHALMOLOGY EXAM  Never done   HEMOGLOBIN A1C  03/12/2022   INFLUENZA VACCINE  10/14/2022   MAMMOGRAM  08/12/2023   DEXA SCAN  Completed      09/10/2021   10:33 AM 10/25/2021    7:25 PM 03/07/2022   11:23 AM 03/22/2022    6:44 PM 06/15/2022   12:51 PM  Fall Risk  Falls in the past year? 0    0  Was there an injury with Fall? 0    0  Fall Risk Category Calculator 0    0  Fall Risk Category (Retired) Low      (RETIRED) Patient Fall Risk Level Low fall risk Low fall risk Low fall risk Low fall risk   Patient at Risk for Falls Due to No Fall Risks    History of fall(s)  Fall risk Follow up Falls evaluation completed    Falls evaluation completed   Functional Status Survey:    Vitals:   06/15/22 1254  BP:  128/78  Pulse: 64  Resp: 18  Temp: (!) 97.4 F (36.3 C)  SpO2: 98%  Weight: 160 lb (72.6 kg)  Height: 5\' 3"  (1.6 m)   Body mass index is 28.34 kg/m. Physical Exam Vitals reviewed.  Constitutional:      General: She is not in acute distress.    Appearance: Normal appearance. She is overweight. She is not ill-appearing or diaphoretic.  HENT:     Head: Normocephalic.     Right Ear: Tympanic membrane, ear canal and external ear normal. There is no impacted cerumen.     Left Ear: Tympanic membrane, ear canal and external ear normal. There is no impacted cerumen.     Nose: Nose normal. No congestion or rhinorrhea.     Mouth/Throat:     Mouth: Mucous membranes are moist.     Pharynx: Oropharynx is clear. No oropharyngeal exudate or posterior oropharyngeal erythema.  Eyes:     General: No scleral icterus.       Right eye: No discharge.        Left eye: No discharge.     Extraocular Movements: Extraocular movements intact.     Conjunctiva/sclera: Conjunctivae normal.     Pupils: Pupils are equal, round, and reactive to light.  Neck:     Vascular: No carotid bruit.  Cardiovascular:     Rate and Rhythm: Normal rate and regular rhythm.     Pulses: Normal pulses.     Heart sounds: Normal heart sounds. No murmur heard.    No friction rub. No gallop.  Pulmonary:     Effort: Pulmonary effort is normal. No respiratory distress.     Breath sounds: Normal breath sounds. No wheezing, rhonchi or rales.  Chest:     Chest wall: No tenderness.  Abdominal:     General: Bowel sounds are normal. There is no distension.     Palpations: Abdomen is soft. There is no mass.     Tenderness: There is no abdominal tenderness. There is no right CVA tenderness, left CVA tenderness, guarding or rebound.  Musculoskeletal:        General: No swelling or tenderness. Normal range of motion.     Cervical back: Normal range of motion. No rigidity or tenderness.     Right lower leg: No edema.     Left lower  leg: No edema.  Lymphadenopathy:     Cervical: No cervical adenopathy.  Skin:    General: Skin is warm and  dry.     Coloration: Skin is not pale.     Findings: No bruising, erythema, lesion or rash.     Comments: Bilateral bunion and left 3 rd toe callus   Neurological:     Mental Status: She is alert. Mental status is at baseline.     Cranial Nerves: No cranial nerve deficit.     Sensory: No sensory deficit.     Motor: No weakness.     Coordination: Coordination normal.     Gait: Gait abnormal.  Psychiatric:        Mood and Affect: Mood normal.        Speech: Speech normal.        Behavior: Behavior normal.        Thought Content: Thought content normal.        Judgment: Judgment normal.     Labs reviewed: Recent Labs    09/10/21 1119  NA 142  K 3.6  CL 107  CO2 26  GLUCOSE 104*  BUN 7  CREATININE 0.70  CALCIUM 9.2   No results for input(s): "AST", "ALT", "ALKPHOS", "BILITOT", "PROT", "ALBUMIN" in the last 8760 hours. Recent Labs    09/10/21 1119  WBC 3.5*  NEUTROABS 1,726  HGB 11.9  HCT 36.6  MCV 89.7  PLT 237   Lab Results  Component Value Date   TSH 1.30 09/10/2021   Lab Results  Component Value Date   HGBA1C 6.9 (H) 09/10/2021   Lab Results  Component Value Date   CHOL 110 09/10/2021   HDL 47 (L) 09/10/2021   LDLCALC 49 09/10/2021   TRIG 65 09/10/2021   CHOLHDL 2.3 09/10/2021    Significant Diagnostic Results in last 30 days:  No results found.  Assessment/Plan 1. Essential hypertension Blood pressure well-controlled -Continue on lisinopril and amlodipine - TSH - COMPLETE METABOLIC PANEL WITH GFR - CBC with Differential/Platelet  2. Mixed hyperlipidemia Previous LDL at goal -Continue on atorvastatin -Continue with dietary modification and walking exercises - Lipid panel  3. Type 2 diabetes mellitus without complication, without long-term current use of insulin Lab Results  Component Value Date   HGBA1C 6.9 (H) 09/10/2021  -  continue on dietary modification and exercise  - TSH - COMPLETE METABOLIC PANEL WITH GFR - CBC with Differential/Platelet - Hemoglobin A1c - Microalbumin / creatinine urine ratio - lisinopril (ZESTRIL) 2.5 MG tablet; Take 1 tablet (2.5 mg total) by mouth daily.  Dispense: 90 tablet; Refill: 1  4. RLS (restless legs syndrome) Stable  - continue on Requip  - rOPINIRole (REQUIP) 0.5 MG tablet; TAKE 1 TABLET BY MOUTH EVERYDAY AT BEDTIME  Dispense: 90 tablet; Refill: 2  5. Pulmonary hypertension No shortness of breath  - continue to monitor   6. History of pulmonary embolism Continue on Rivaroxaban   Family/ staff Communication: Reviewed plan of care with patient verbalized understanding.   Labs/tests ordered:   - TSH - COMPLETE METABOLIC PANEL WITH GFR - CBC with Differential/Platelet - Hemoglobin A1c - Lipid panel  Next Appointment : Return in about 6 months (around 12/15/2022) for medical mangement of chronic issues.Sandrea Hughs, NP

## 2022-06-16 LAB — COMPLETE METABOLIC PANEL WITH GFR
AG Ratio: 1.1 (calc) (ref 1.0–2.5)
ALT: 17 U/L (ref 6–29)
AST: 23 U/L (ref 10–35)
Albumin: 4.1 g/dL (ref 3.6–5.1)
Alkaline phosphatase (APISO): 96 U/L (ref 37–153)
BUN: 10 mg/dL (ref 7–25)
CO2: 24 mmol/L (ref 20–32)
Calcium: 9.4 mg/dL (ref 8.6–10.4)
Chloride: 108 mmol/L (ref 98–110)
Creat: 0.65 mg/dL (ref 0.60–0.95)
Globulin: 3.6 g/dL (calc) (ref 1.9–3.7)
Glucose, Bld: 100 mg/dL — ABNORMAL HIGH (ref 65–99)
Potassium: 3.8 mmol/L (ref 3.5–5.3)
Sodium: 142 mmol/L (ref 135–146)
Total Bilirubin: 0.6 mg/dL (ref 0.2–1.2)
Total Protein: 7.7 g/dL (ref 6.1–8.1)
eGFR: 89 mL/min/{1.73_m2} (ref >=60)

## 2022-06-16 LAB — HEMOGLOBIN A1C
Hgb A1c MFr Bld: 7.4 % of total Hgb — ABNORMAL HIGH (ref <5.7)
Mean Plasma Glucose: 166 mg/dL
eAG (mmol/L): 9.2 mmol/L

## 2022-06-16 LAB — CBC WITH DIFFERENTIAL/PLATELET
Absolute Monocytes: 497 cells/uL (ref 200–950)
Basophils Absolute: 22 cells/uL (ref 0–200)
Basophils Relative: 0.6 %
Eosinophils Absolute: 50 cells/uL (ref 15–500)
Eosinophils Relative: 1.4 %
HCT: 35.8 % (ref 35.0–45.0)
Hemoglobin: 11.6 g/dL — ABNORMAL LOW (ref 11.7–15.5)
Lymphs Abs: 1587.6 cells/uL (ref 850–3900)
MCH: 28.5 pg (ref 27.0–33.0)
MCHC: 32.4 g/dL (ref 32.0–36.0)
MCV: 88 fL (ref 80.0–100.0)
MPV: 10.3 fL (ref 7.5–12.5)
Monocytes Relative: 13.8 %
Neutro Abs: 1444 cells/uL — ABNORMAL LOW (ref 1500–7800)
Neutrophils Relative %: 40.1 %
Platelets: 242 10*3/uL (ref 140–400)
RBC: 4.07 10*6/uL (ref 3.80–5.10)
RDW: 13.7 % (ref 11.0–15.0)
Total Lymphocyte: 44.1 %
WBC: 3.6 10*3/uL — ABNORMAL LOW (ref 3.8–10.8)

## 2022-06-16 LAB — MICROALBUMIN / CREATININE URINE RATIO
Creatinine, Urine: 165 mg/dL (ref 20–275)
Microalb Creat Ratio: 7 mg/g creat (ref <30)
Microalb, Ur: 1.1 mg/dL

## 2022-06-16 LAB — LIPID PANEL
Cholesterol: 117 mg/dL (ref <200)
HDL: 55 mg/dL (ref >=50)
LDL Cholesterol (Calc): 48 mg/dL (calc)
Non-HDL Cholesterol (Calc): 62 mg/dL (calc) (ref <130)
Total CHOL/HDL Ratio: 2.1 (calc) (ref <5.0)
Triglycerides: 62 mg/dL (ref <150)

## 2022-06-16 LAB — TSH: TSH: 0.8 mIU/L (ref 0.40–4.50)

## 2022-06-21 ENCOUNTER — Other Ambulatory Visit: Payer: Self-pay

## 2022-06-21 MED ORDER — ATORVASTATIN CALCIUM 80 MG PO TABS: 80.0000 mg | ORAL_TABLET | Freq: Every day | ORAL | 2 refills | Status: DC

## 2022-07-03 ENCOUNTER — Other Ambulatory Visit: Payer: Self-pay | Admitting: Family

## 2022-07-03 DIAGNOSIS — I10 Essential (primary) hypertension: Secondary | ICD-10-CM

## 2022-07-05 ENCOUNTER — Other Ambulatory Visit: Payer: PPO | Admitting: Pharmacist

## 2022-07-05 NOTE — Progress Notes (Signed)
   07/05/2022 Name: Catherine Snyder MRN: [id number] DOB: October 17, 1940   Catherine Snyder is a 82 y.o. year old female who presented for a telephone visit.   They were referred to the pharmacist by a quality report for assistance in managing  controlling blood pressure (CBP) .   Subjective:  Care Team: Primary Care Provider: Caesar Bookman, NP  Medication Access/Adherence  Current Pharmacy:  [facility] - [place], [place] - [address] [place] [address] Phone: [phone] Fax: [phone]  [id number] - [place],  - [address] AT [facility] [address] [place] [address] Phone: [phone] Fax: [phone]    Hypertension:  Current medications: lisinopril 2.5mg  daily, amlodipine  daily  Patient does not have a validated, automated, upper arm home BP cuff, she prefers to only check at office visits as expressing no symptoms or issues with blood pressure  Patient denies hypotensive s/sx including dizziness, lightheadedness.  Patient denies hypertensive symptoms including headache, chest pain, shortness of breath    Objective:  Lab Results  Component Value Date   HGBA1C 7.4 (H) 06/15/2022    Lab Results  Component Value Date   CREATININE 0.65 06/15/2022   BUN 10 06/15/2022   NA 142 06/15/2022   K 3.8 06/15/2022   CL 108 06/15/2022   CO2 24 06/15/2022    Lab Results  Component Value Date   CHOL 117 06/15/2022   HDL 55 06/15/2022   LDLCALC 48 06/15/2022   TRIG 62 06/15/2022   CHOLHDL 2.1 06/15/2022    Medications Reviewed Today     Reviewed by Michaell Cowing, CMA (Certified Medical Assistant) on 06/15/22 at 1301  Med List Status: <None>   Medication Order Taking? Sig Documenting Provider Last Dose Status Informant  acetaminophen (TYLENOL) 500 MG tablet [id number] Yes Take 500 mg by mouth every 6 (six) hours as needed for moderate  pain. [provider] Taking Active   amLODipine (NORVASC) 10 MG tablet [id number] Yes TAKE 1 TABLET BY MOUTH EVERY DAY Ngetich, Dinah C, NP Taking Active   Ascorbic Acid (VITAMIN C) 1000 MG tablet [id number] Yes Take 1,000 mg by mouth daily. [provider] Taking Active Self  atorvastatin (LIPITOR) 80 MG tablet [id number] Yes TAKE ONE TABLET BY MOUTH ONCE DAILY AT 6PM Ngetich, Donalee Citrin, NP Taking Active   Discontinued 06/15/22 1300 (Patient Preference) diclofenac Sodium (VOLTAREN) 1 % GEL [id number] Yes Apply 2 g topically as needed. [provider] Taking Active   Discontinued 06/15/22 1301 (Patient Preference) Discontinued 06/15/22 1301 (Patient Preference)   rivaroxaban (XARELTO) 20 MG TABS tablet [id number] Yes TAKE ONE TABLET BY MOUTH ONCE DAILY WITH SUPPER Ngetich, Dinah C, NP Taking Active   rOPINIRole (REQUIP) 0.5 MG tablet [id number] Yes TAKE 1 TABLET BY MOUTH EVERYDAY AT BEDTIME Ngetich, Dinah C, NP Taking Active               Assessment/Plan:   Hypertension: - Currently controlled - Recommend to continue current regimen   Lynnda Shields, PharmD, BCPS Clinical Pharmacist [facility]

## 2022-09-10 ENCOUNTER — Encounter: Payer: PPO | Admitting: Family

## 2022-09-20 ENCOUNTER — Encounter: Payer: Self-pay | Admitting: Family

## 2022-09-20 ENCOUNTER — Ambulatory Visit (INDEPENDENT_AMBULATORY_CARE_PROVIDER_SITE_OTHER): Payer: PPO | Admitting: Family

## 2022-09-20 VITALS — BP 136/80 | HR 69 | Temp 97.1°F | Ht 63.0 in | Wt 149.0 lb

## 2022-09-20 DIAGNOSIS — Z0000 Encounter for general adult medical examination without abnormal findings: Secondary | ICD-10-CM | POA: Diagnosis not present

## 2022-09-20 NOTE — Progress Notes (Signed)
Subjective:   Catherine Snyder is a 82 y.o. female who presents for Medicare Annual (Subsequent) preventive examination.  Visit Complete: In person  Patient Medicare AWV questionnaire was completed by the patient on 09/20/2022; I have confirmed that all information answered by patient is correct and no changes since this date.  Review of Systems    No acute issues. Cardiac Risk Factors include: advanced age (>62men, >16 women);hypertension     Objective:    Today's Vitals   09/20/22 1022  BP: 136/80  Pulse: 69  Temp: (!) 97.1 F (36.2 C)  TempSrc: Temporal  SpO2: 99%  Weight: 149 lb (67.6 kg)  Height: 5\' 3"  (1.6 m)   Body mass index is 26.39 kg/m.     09/20/2022    9:51 AM 03/22/2022    6:44 PM 10/25/2021    7:25 PM 09/10/2021   10:33 AM 09/08/2021    3:26 PM 10/17/2020    3:51 PM 09/01/2020   10:47 AM  Advanced Directives  Does Patient Have a Medical Advance Directive? Yes No Yes Yes Yes Yes Yes  Type of Estate agent of [place];Living will;Out of facility DNR (pink MOST or yellow form)  Healthcare Power of [employer] of [place];Living will Healthcare Power of [place];Living will Healthcare Power of [place];Living will;Out of facility DNR (pink MOST or yellow form) Healthcare Power of [place];Living will  Does patient want to make changes to medical advance directive? No - Patient declined   No - Patient declined No - Patient declined No - Patient declined No - Patient declined  Copy of Healthcare Power of Attorney in Chart? Yes - validated most recent copy scanned in chart (See row information)  Yes - validated most recent copy scanned in chart (See row information) Yes - validated most recent copy scanned in chart (See row information) Yes - validated most recent copy scanned in chart (See row information) Yes - validated most recent copy scanned in chart (See row information) Yes - validated most recent copy scanned in chart (See row  information)  Pre-existing out of facility DNR order (yellow form or pink MOST form) Pink MOST form placed in chart (order not valid for inpatient use)          Current Medications (verified) Outpatient Encounter Medications as of 09/20/2022  Medication Sig   acetaminophen (TYLENOL) 500 MG tablet Take 500 mg by mouth every 6 (six) hours as needed for moderate pain.   amLODipine (NORVASC) 10 MG tablet TAKE 1 TABLET BY MOUTH EVERY DAY   Ascorbic Acid (VITAMIN C) 1000 MG tablet Take 1,000 mg by mouth daily.   atorvastatin (LIPITOR) 80 MG tablet Take 1 tablet (80 mg total) by mouth at bedtime.   diclofenac Sodium (VOLTAREN) 1 % GEL Apply 2 g topically as needed.   lisinopril (ZESTRIL) 2.5 MG tablet Take 1 tablet (2.5 mg total) by mouth daily.   rivaroxaban (XARELTO) 20 MG TABS tablet TAKE ONE TABLET BY MOUTH ONCE DAILY WITH SUPPER   rOPINIRole (REQUIP) 0.5 MG tablet TAKE 1 TABLET BY MOUTH EVERYDAY AT BEDTIME   No facility-administered encounter medications on file as of 09/20/2022.    Allergies (verified) Patient has no known allergies.   History: Past Medical History:  Diagnosis Date   Absolute anemia    CAD in native artery    40% LAD at cath 02/04/16   Chronic anticoagulation 03/02/2016   Demand ischemia of myocardium - Not NSTEMI 02/03/2016   Dyslipidemia 03/02/2016  Dyspnea on exertion    Elevated troponin    Essential hypertension 11/27/2014   Frequency of urination    Hematuria    History of bladder cancer    a. 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   RLS (restless legs syndrome) 05/21/2016   Saddle embolus of pulmonary artery without acute cor pulmonale    Acute saddle PE treated with direct thrombolytics 02/07/16   Wears glasses    Wears partial dentures    upper    Past Surgical History:  Procedure Laterality Date   CARDIAC CATHETERIZATION N/A 02/04/2016   Procedure: RIGHT/LEFT HEART CATH AND CORONARY ANGIOGRAPHY;  Surgeon: Lennette Bihari, MD;  Location: [facility];  Service: Cardiovascular;  Laterality: N/A;   CYSTOSCOPY W/ RETROGRADES Bilateral 07/07/2015   Procedure: CYSTOSCOPY WITH RETROGRADE PYELOGRAM;  Surgeon: Malen Gauze, MD;  Location: [facility];  Service: Urology;  Laterality: Bilateral;   IR GENERIC HISTORICAL  02/07/2016   IR ANGIOGRAM SELECTIVE EACH ADDITIONAL VESSEL 02/07/2016 Malachy Moan, MD [facility]   IR GENERIC HISTORICAL  02/07/2016   IR US GUIDE VASC ACCESS RIGHT 02/07/2016 Malachy Moan, MD [facility]   IR GENERIC HISTORICAL  02/07/2016   IR ANGIOGRAM PULMONARY BILATERAL SELECTIVE 02/07/2016 Malachy Moan, MD [facility]   IR GENERIC HISTORICAL  02/07/2016   IR INFUSION THROMBOL ARTERIAL INITIAL (MS) 02/07/2016 Malachy Moan, MD [facility]   IR GENERIC HISTORICAL  02/07/2016   IR INFUSION THROMBOL ARTERIAL INITIAL (MS) 02/07/2016 Malachy Moan, MD [facility]   IR GENERIC HISTORICAL  02/07/2016   IR ANGIOGRAM SELECTIVE EACH ADDITIONAL VESSEL 02/07/2016 Malachy Moan, MD [facility]   NO PAST SURGERIES     TRANSURETHRAL RESECTION OF BLADDER TUMOR WITH GYRUS (TURBT-GYRUS) N/A 07/07/2015   Procedure: TRANSURETHRAL RESECTION OF BLADDER TUMOR WITH GYRUS (TURBT-GYRUS);  Surgeon: Malen Gauze, MD;  Location: [facility];  Service: Urology;  Laterality: N/A;   Family History  Problem Relation Age of Onset   CAD Mother 3   Diabetes Mother    Seizures Mother    Heart disease Father 54   Seizures Brother    CAD Brother 68   Social History   Socioeconomic History   Marital status: Single    Spouse name: Not on file   Number of children: Not on file   Years of education: 12   Highest education level: High school graduate  Occupational  History   Occupation: Retired    Comment: Dietician  Tobacco Use   Smoking status: Never   Smokeless tobacco: Never  Building services engineer Use: Never used  Substance and Sexual Activity   Alcohol use: No   Drug use: No   Sexual activity: Not Currently    Comment: 1st intercourse- 20's, partners- 3, divorced  Other Topics Concern   Not on file  Social History Narrative    Diet:   Do you drink/eat things with caffeine? Yes coffee   Marital status: Divorced                              What year were you married?    Do you live in a house, apartment, assisted living, condo, trailer, etc)? House   Is  it one or more stories? 1 story   How many persons live in your home? 4   Do you have any pets in your home? 0   Current or past profession: Dietitian   Do you exercise? Yes                                       Type & how often:  Walk daily   Do you have a living will? No   Do you have a DNR Form? No   Do you have a POA/HPOA forms? No   Social Determinants of Health   Financial Resource Strain: Low Risk  (06/13/2017)   Overall Financial Resource Strain (CARDIA)    Difficulty of Paying Living Expenses: Not hard at all  Food Insecurity: No Food Insecurity (06/13/2017)   Hunger Vital Sign    Worried About Running Out of Food in the Last Year: Never true    Ran Out of Food in the Last Year: Never true  Transportation Needs: No Transportation Needs (06/13/2017)   PRAPARE - Administrator, Civil Service (Medical): No    Lack of Transportation (Non-Medical): No  Physical Activity: Insufficiently Active (06/13/2017)   Exercise Vital Sign    Days of Exercise per Week: 7 days    Minutes of Exercise per Session: 10 min  Stress: No Stress Concern Present (06/13/2017)   [employer] of Occupational Health - Occupational Stress Questionnaire    Feeling of Stress : Only a little  Social Connections: Somewhat Isolated (06/13/2017)   Social Connection and Isolation Panel [NHANES]     Frequency of Communication with Friends and Family: More than three times a week    Frequency of Social Gatherings with Friends and Family: More than three times a week    Attends Religious Services: More than 4 times per year    Active Member of [employer] or Organizations: No    Attends Banker Meetings: Never    Marital Status: Widowed    Tobacco Counseling Counseling given: Not Answered   Clinical Intake:  Pre-visit preparation completed: No  Pain : No/denies pain     BMI - recorded: 26.39 Nutritional Status: BMI 25 -29 Overweight Nutritional Risks: None Diabetes: No  How often do you need to have someone help you when you read instructions, pamphlets, or other written materials from your doctor or pharmacy?: 3 - Sometimes (gives to her daughter) What is the last grade level you completed in school?: 12 grade  Interpreter Needed?: No      Activities of Daily Living    09/20/2022   10:50 AM  In your present state of health, do you have any difficulty performing the following activities:  Hearing? 0  Vision? 0  Difficulty concentrating or making decisions? 0  Walking or climbing stairs? 0  Dressing or bathing? 0  Doing errands, shopping? 1  Comment daughter Insurance claims handler and eating ? N  Using the Toilet? N  In the past six months, have you accidently leaked urine? N  Do you have problems with loss of bowel control? N  Managing your Medications? N  Managing your Finances? N  Housekeeping or managing your Housekeeping? N    Patient Care Team: Yarden Manuelito, Donalee Citrin, NP as PCP - General (Family Medicine)  Indicate any recent Medical Services you may have received from other than [facility] providers in the  past year (date may be approximate).     Assessment:   This is a routine wellness examination for [place].  Hearing/Vision screen Vision Screening   Right eye Left eye Both eyes  Without correction 20/40 20/50 20/20   With correction      Comments: Last eye exam greater than 12 months ago with Dr.Groat.   Hearing Screening - Comments:: No hearing issues   Dietary issues and exercise activities discussed:     Goals Addressed             This Visit's Progress    Patient Stated       Exercise daily         Depression Screen    09/20/2022   10:20 AM 06/15/2022   12:53 PM 09/08/2021    3:22 PM 09/01/2020   10:45 AM 07/17/2019    2:45 PM 09/07/2018    1:31 PM 07/14/2018    2:00 PM  PHQ 2/9 Scores  PHQ - 2 Score 0 0 0 0 0 0 0    Fall Risk    09/20/2022   10:20 AM 06/15/2022   12:51 PM 09/10/2021   10:33 AM 09/08/2021    3:22 PM 10/17/2020    3:51 PM  Fall Risk   Falls in the past year? 0 0 0 0 0  Number falls in past yr: 0 0 0 0 0  Injury with Fall? 0 0 0 0 0  Risk for fall due to : No Fall Risks History of fall(s) No Fall Risks No Fall Risks No Fall Risks  Follow up Falls evaluation completed Falls evaluation completed Falls evaluation completed Falls evaluation completed Falls evaluation completed    MEDICARE RISK AT HOME:   TIMED UP AND GO:  Was the test performed?  Yes  Length of time to ambulate 10 feet: 15 sec Gait slow and steady with assistive device    Cognitive Function:    09/20/2022   10:26 AM 06/13/2017    2:46 PM 05/07/2016    2:09 PM 05/07/2015    3:30 PM  MMSE - Mini Mental State Exam  Orientation to time 4 5 5 4   Orientation to time comments siad today was the 7th     Orientation to Place 5 3 4 4   Registration 3 3 3 3   Attention/ Calculation 5 5 5 5   Recall 1 1 3 2   Recall-comments missed table and penny     Language- name 2 objects 2 2 2 2   Language- repeat 1 1 1  0  Language- follow 3 step command 1 3 3 3   Language- follow 3 step command-comments folded paper twice and did not place in chair     Language- read & follow direction 0 1 1 1   Language-read & follow direction-comments closed eyes, however did not read sentence out loud     Write a sentence 1 1 0 1  Copy design 0 1 1 0   Total score 23 26 28 25         09/08/2021    3:23 PM 07/17/2019    2:46 PM 07/14/2018    2:01 PM  6CIT Screen  What Year? 0 points 0 points 0 points  What month? 0 points 0 points 0 points  What time? 0 points 0 points 0 points  Count back from 20 4 points 0 points 0 points  Months in reverse 0 points  0 points  Repeat phrase 4 points 2 points 0 points  Total  Score 8 points  0 points    Immunizations Immunization History  Administered Date(s) Administered   Influenza,inj,Quad PF,6+ Mos 11/27/2014   PNEUMOCOCCAL CONJUGATE-20 09/10/2021   Pneumococcal Conjugate-13 06/13/2017    TDAP status: Due, Education has been provided regarding the importance of this vaccine. Advised may receive this vaccine at local pharmacy or Health Dept. Aware to provide a copy of the vaccination record if obtained from local pharmacy or Health Dept. Verbalized acceptance and understanding.  Flu Vaccine status: Up to date  Pneumococcal vaccine status: Up to date  Covid-19 vaccine status: Information provided on how to obtain vaccines.   Qualifies for Shingles Vaccine? Yes   Zostavax completed No   Shingrix Completed?: No.    Education has been provided regarding the importance of this vaccine. Patient has been advised to call insurance company to determine out of pocket expense if they have not yet received this vaccine. Advised may also receive vaccine at local pharmacy or Health Dept. Verbalized acceptance and understanding.  Screening Tests Health Maintenance  Topic Date Due   OPHTHALMOLOGY EXAM  Never done   DTaP/Tdap/Td (1 - Tdap) Never done   Zoster Vaccines- Shingrix (1 of 2) Never done   INFLUENZA VACCINE  10/14/2022   HEMOGLOBIN A1C  12/15/2022   Diabetic kidney evaluation - eGFR measurement  06/15/2023   Diabetic kidney evaluation - Urine ACR  06/15/2023   FOOT EXAM  06/15/2023   MAMMOGRAM  08/12/2023   Medicare Annual Wellness (AWV)  09/20/2023   Pneumonia Vaccine 35+ Years old   Completed   DEXA SCAN  Completed   HPV VACCINES  Aged Out   COVID-19 Vaccine  Discontinued   Hepatitis C Screening  Discontinued    Health Maintenance  Health Maintenance Due  Topic Date Due   OPHTHALMOLOGY EXAM  Never done   DTaP/Tdap/Td (1 - Tdap) Never done   Zoster Vaccines- Shingrix (1 of 2) Never done    Colorectal cancer screening: No longer required.   Mammogram status: Completed 08/11/2021. Repeat every year  Bone Density status: Completed 5/20/20219. Results reflect: Bone density results: NORMAL. Repeat every 2 years.  Lung Cancer Screening: (Low Dose CT Chest recommended if Age 25-80 years, 20 pack-year currently smoking OR have quit w/in 15years.) does not qualify.   Lung Cancer Screening Referral: No   Additional Screening:  Hepatitis C Screening: does not qualify; Completed Yes   Vision Screening: Recommended annual ophthalmology exams for early detection of glaucoma and other disorders of the eye. Is the patient up to date with their annual eye exam?  No  Who is the provider or what is the name of the office in which the patient attends annual eye exams? Dr.Groat  If pt is not established with a provider, would they like to be referred to a provider to establish care? No .   Dental Screening: Recommended annual dental exams for proper oral hygiene  Diabetic Foot Exam: Diabetic Foot Exam: Completed N/A   Community Resource Referral / Chronic Care Management: CRR required this visit?  No   CCM required this visit?  No     Plan:     I have personally reviewed and noted the following in the patient's chart:   Medical and social history Use of alcohol, tobacco or illicit drugs  Current medications and supplements including opioid prescriptions. Patient is not currently taking opioid prescriptions. Functional ability and status Nutritional status Physical activity Advanced directives List of other physicians Hospitalizations, surgeries, and ER visits  in previous 12 months Vitals Screenings to include cognitive, depression, and falls Referrals and appointments  In addition, I have reviewed and discussed with patient certain preventive protocols, quality metrics, and best practice recommendations. A written personalized care plan for preventive services as well as general preventive health recommendations were provided to patient.     Caesar Bookman, NP   09/20/2022   After Visit Summary: (Pick Up) Due to this being a telephonic visit, with patients personalized plan was offered to patient and patient has requested to Pick up at office. In person   Nurse Notes: Advised to get Tdap,shingles and COVID-19 vaccines at the pharmacy.

## 2022-09-20 NOTE — Patient Instructions (Signed)
1.) Visit your local pharmacy to receive the tetanus,COVID-19 and shingrix vaccines  2.) Scheduled your next annual wellness visit for 12 months and 1 day out.   Catherine Snyder , Thank you for taking time to come for your Medicare Wellness Visit. I appreciate your ongoing commitment to your health goals. Please review the following plan we discussed and let me know if I can assist you in the future.   Screening recommendations/referrals: Colonoscopy : N/A Mammogram : Up to date  Bone Density : : Up to date  Recommended yearly ophthalmology/optometry visit for glaucoma screening and checkup Recommended yearly dental visit for hygiene and checkup  Vaccinations: Influenza vaccine- due annually in September/October Pneumococcal vaccine : Up to date  Tdap vaccine : Due  Shingles vaccine : due     Advanced directives: Yes   Conditions/risks identified:  advanced age (>76men, >47 women);hypertension  Next appointment: 1 year    Preventive Care 68 Years and Older, Female Preventive care refers to lifestyle choices and visits with your health care provider that can promote health and wellness. What does preventive care include? A yearly physical exam. This is also called an annual well check. Dental exams once or twice a year. Routine eye exams. Ask your health care provider how often you should have your eyes checked. Personal lifestyle choices, including: Daily care of your teeth and gums. Regular physical activity. Eating a healthy diet. Avoiding tobacco and drug use. Limiting alcohol use. Practicing safe sex. Taking low-dose aspirin every day. Taking vitamin and mineral supplements as recommended by your health care provider. What happens during an annual well check? The services and screenings done by your health care provider during your annual well check will depend on your age, overall health, lifestyle risk factors, and family history of disease. Counseling  Your health care  provider may ask you questions about your: Alcohol use. Tobacco use. Drug use. Emotional well-being. Home and relationship well-being. Sexual activity. Eating habits. History of falls. Memory and ability to understand (cognition). Work and work Astronomer. Reproductive health. Screening  You may have the following tests or measurements: Height, weight, and BMI. Blood pressure. Lipid and cholesterol levels. These may be checked every 5 years, or more frequently if you are over 59 years old. Skin check. Lung cancer screening. You may have this screening every year starting at age 70 if you have a 30-pack-year history of smoking and currently smoke or have quit within the past 15 years. Fecal occult blood test (FOBT) of the stool. You may have this test every year starting at age 95. Flexible sigmoidoscopy or colonoscopy. You may have a sigmoidoscopy every 5 years or a colonoscopy every 10 years starting at age 41. Hepatitis C blood test. Hepatitis B blood test. Sexually transmitted disease (STD) testing. Diabetes screening. This is done by checking your blood sugar (glucose) after you have not eaten for a while (fasting). You may have this done every 1-3 years. Bone density scan. This is done to screen for osteoporosis. You may have this done starting at age 25. Mammogram. This may be done every 1-2 years. Talk to your health care provider about how often you should have regular mammograms. Talk with your health care provider about your test results, treatment options, and if necessary, the need for more tests. Vaccines  Your health care provider may recommend certain vaccines, such as: Influenza vaccine. This is recommended every year. Tetanus, diphtheria, and acellular pertussis (Tdap, Td) vaccine. You may need a Td booster  every 10 years. Zoster vaccine. You may need this after age 22. Pneumococcal 13-valent conjugate (PCV13) vaccine. One dose is recommended after age  74. Pneumococcal polysaccharide (PPSV23) vaccine. One dose is recommended after age 62. Talk to your health care provider about which screenings and vaccines you need and how often you need them. This information is not intended to replace advice given to you by your health care provider. Make sure you discuss any questions you have with your health care provider. Document Released: 03/28/2015 Document Revised: 11/19/2015 Document Reviewed: 12/31/2014 Elsevier Interactive Patient Education  2017 [employer].  Fall Prevention in the Home Falls can cause injuries. They can happen to people of all ages. There are many things you can do to make your home safe and to help prevent falls. What can I do on the outside of my home? Regularly fix the edges of walkways and driveways and fix any cracks. Remove anything that might make you trip as you walk through a door, such as a raised step or threshold. Trim any bushes or trees on the path to your home. Use bright outdoor lighting. Clear any walking paths of anything that might make someone trip, such as rocks or tools. Regularly check to see if handrails are loose or broken. Make sure that both sides of any steps have handrails. Any raised decks and porches should have guardrails on the edges. Have any leaves, snow, or ice cleared regularly. Use sand or salt on walking paths during winter. Clean up any spills in your garage right away. This includes oil or grease spills. What can I do in the bathroom? Use night lights. Install grab bars by the toilet and in the tub and shower. Do not use towel bars as grab bars. Use non-skid mats or decals in the tub or shower. If you need to sit down in the shower, use a plastic, non-slip stool. Keep the floor dry. Clean up any water that spills on the floor as soon as it happens. Remove soap buildup in the tub or shower regularly. Attach bath mats securely with double-sided non-slip rug tape. Do not have throw  rugs and other things on the floor that can make you trip. What can I do in the bedroom? Use night lights. Make sure that you have a light by your bed that is easy to reach. Do not use any sheets or blankets that are too big for your bed. They should not hang down onto the floor. Have a firm chair that has side arms. You can use this for support while you get dressed. Do not have throw rugs and other things on the floor that can make you trip. What can I do in the kitchen? Clean up any spills right away. Avoid walking on wet floors. Keep items that you use a lot in easy-to-reach places. If you need to reach something above you, use a strong step stool that has a grab bar. Keep electrical cords out of the way. Do not use floor polish or wax that makes floors slippery. If you must use wax, use non-skid floor wax. Do not have throw rugs and other things on the floor that can make you trip. What can I do with my stairs? Do not leave any items on the stairs. Make sure that there are handrails on both sides of the stairs and use them. Fix handrails that are broken or loose. Make sure that handrails are as long as the stairways. Check any carpeting to  make sure that it is firmly attached to the stairs. Fix any carpet that is loose or worn. Avoid having throw rugs at the top or bottom of the stairs. If you do have throw rugs, attach them to the floor with carpet tape. Make sure that you have a light switch at the top of the stairs and the bottom of the stairs. If you do not have them, ask someone to add them for you. What else can I do to help prevent falls? Wear shoes that: Do not have high heels. Have rubber bottoms. Are comfortable and fit you well. Are closed at the toe. Do not wear sandals. If you use a stepladder: Make sure that it is fully opened. Do not climb a closed stepladder. Make sure that both sides of the stepladder are locked into place. Ask someone to hold it for you, if  possible. Clearly mark and make sure that you can see: Any grab bars or handrails. First and last steps. Where the edge of each step is. Use tools that help you move around (mobility aids) if they are needed. These include: Canes. Walkers. Scooters. Crutches. Turn on the lights when you go into a dark area. Replace any light bulbs as soon as they burn out. Set up your furniture so you have a clear path. Avoid moving your furniture around. If any of your floors are uneven, fix them. If there are any pets around you, be aware of where they are. Review your medicines with your doctor. Some medicines can make you feel dizzy. This can increase your chance of falling. Ask your doctor what other things that you can do to help prevent falls. This information is not intended to replace advice given to you by your health care provider. Make sure you discuss any questions you have with your health care provider. Document Released: 12/26/2008 Document Revised: 08/07/2015 Document Reviewed: 04/05/2014 Elsevier Interactive Patient Education  2017 [employer].

## 2022-10-21 ENCOUNTER — Other Ambulatory Visit: Payer: Self-pay | Admitting: Family

## 2022-10-21 DIAGNOSIS — I10 Essential (primary) hypertension: Secondary | ICD-10-CM

## 2022-10-28 ENCOUNTER — Encounter (HOSPITAL_BASED_OUTPATIENT_CLINIC_OR_DEPARTMENT_OTHER): Payer: Self-pay

## 2022-10-28 ENCOUNTER — Other Ambulatory Visit: Payer: Self-pay

## 2022-10-28 ENCOUNTER — Inpatient Hospital Stay (HOSPITAL_BASED_OUTPATIENT_CLINIC_OR_DEPARTMENT_OTHER)
Admission: EM | Admit: 2022-10-28 | Discharge: 2022-10-30 | DRG: 390 | Disposition: A | Discharge status: Home / Self Care | Payer: PPO | Attending: Internal Medicine | Admitting: Internal Medicine

## 2022-10-28 ENCOUNTER — Emergency Department (HOSPITAL_BASED_OUTPATIENT_CLINIC_OR_DEPARTMENT_OTHER): Payer: PPO

## 2022-10-28 ENCOUNTER — Other Ambulatory Visit (HOSPITAL_BASED_OUTPATIENT_CLINIC_OR_DEPARTMENT_OTHER): Payer: Self-pay

## 2022-10-28 DIAGNOSIS — K449 Diaphragmatic hernia without obstruction or gangrene: Secondary | ICD-10-CM | POA: Diagnosis not present

## 2022-10-28 DIAGNOSIS — R030 Elevated blood-pressure reading, without diagnosis of hypertension: Secondary | ICD-10-CM

## 2022-10-28 DIAGNOSIS — Z7901 Long term (current) use of anticoagulants: Secondary | ICD-10-CM | POA: Diagnosis not present

## 2022-10-28 DIAGNOSIS — D509 Iron deficiency anemia, unspecified: Secondary | ICD-10-CM | POA: Diagnosis present

## 2022-10-28 DIAGNOSIS — Z833 Family history of diabetes mellitus: Secondary | ICD-10-CM

## 2022-10-28 DIAGNOSIS — I272 Pulmonary hypertension, unspecified: Secondary | ICD-10-CM | POA: Diagnosis not present

## 2022-10-28 DIAGNOSIS — E876 Hypokalemia: Secondary | ICD-10-CM | POA: Diagnosis present

## 2022-10-28 DIAGNOSIS — K56609 Unspecified intestinal obstruction, unspecified as to partial versus complete obstruction: Secondary | ICD-10-CM

## 2022-10-28 DIAGNOSIS — Z86711 Personal history of pulmonary embolism: Secondary | ICD-10-CM | POA: Diagnosis not present

## 2022-10-28 DIAGNOSIS — G2581 Restless legs syndrome: Secondary | ICD-10-CM | POA: Diagnosis present

## 2022-10-28 DIAGNOSIS — Z8249 Family history of ischemic heart disease and other diseases of the circulatory system: Secondary | ICD-10-CM | POA: Diagnosis not present

## 2022-10-28 DIAGNOSIS — E785 Hyperlipidemia, unspecified: Secondary | ICD-10-CM | POA: Diagnosis present

## 2022-10-28 DIAGNOSIS — R1010 Upper abdominal pain, unspecified: Principal | ICD-10-CM

## 2022-10-28 DIAGNOSIS — K56600 Partial intestinal obstruction, unspecified as to cause: Secondary | ICD-10-CM | POA: Diagnosis not present

## 2022-10-28 DIAGNOSIS — R112 Nausea with vomiting, unspecified: Secondary | ICD-10-CM

## 2022-10-28 DIAGNOSIS — K828 Other specified diseases of gallbladder: Secondary | ICD-10-CM | POA: Diagnosis not present

## 2022-10-28 DIAGNOSIS — I2510 Atherosclerotic heart disease of native coronary artery without angina pectoris: Secondary | ICD-10-CM | POA: Diagnosis not present

## 2022-10-28 DIAGNOSIS — E860 Dehydration: Secondary | ICD-10-CM | POA: Diagnosis not present

## 2022-10-28 DIAGNOSIS — E119 Type 2 diabetes mellitus without complications: Secondary | ICD-10-CM | POA: Diagnosis present

## 2022-10-28 DIAGNOSIS — I10 Essential (primary) hypertension: Secondary | ICD-10-CM | POA: Diagnosis not present

## 2022-10-28 DIAGNOSIS — Z8551 Personal history of malignant neoplasm of bladder: Secondary | ICD-10-CM | POA: Diagnosis not present

## 2022-10-28 DIAGNOSIS — Z66 Do not resuscitate: Secondary | ICD-10-CM | POA: Diagnosis present

## 2022-10-28 DIAGNOSIS — Z79899 Other long term (current) drug therapy: Secondary | ICD-10-CM | POA: Diagnosis not present

## 2022-10-28 LAB — COMPREHENSIVE METABOLIC PANEL
ALT: 14 U/L (ref 0–44)
AST: 23 U/L (ref 15–41)
Albumin: 4.3 g/dL (ref 3.5–5.0)
Alkaline Phosphatase: 103 U/L (ref 38–126)
Anion gap: 13 (ref 5–15)
BUN: 9 mg/dL (ref 8–23)
CO2: 23 mmol/L (ref 22–32)
Calcium: 10 mg/dL (ref 8.9–10.3)
Chloride: 105 mmol/L (ref 98–111)
Creatinine, Ser: 0.73 mg/dL (ref 0.44–1.00)
GFR, Estimated: >60 mL/min (ref >60)
Glucose, Bld: 163 mg/dL — ABNORMAL HIGH (ref 70–99)
Potassium: 3.3 mmol/L — ABNORMAL LOW (ref 3.5–5.1)
Sodium: 141 mmol/L (ref 135–145)
Total Bilirubin: 0.8 mg/dL (ref 0.3–1.2)
Total Protein: 8.5 g/dL — ABNORMAL HIGH (ref 6.5–8.1)

## 2022-10-28 LAB — CBC
HCT: 38.1 % (ref 36.0–46.0)
Hemoglobin: 12.6 g/dL (ref 12.0–15.0)
MCH: 29 pg (ref 26.0–34.0)
MCHC: 33.1 g/dL (ref 30.0–36.0)
MCV: 87.8 fL (ref 80.0–100.0)
Platelets: 296 10*3/uL (ref 150–400)
RBC: 4.34 MIL/uL (ref 3.87–5.11)
RDW: 15.4 % (ref 11.5–15.5)
WBC: 6.1 10*3/uL (ref 4.0–10.5)
nRBC: 0 % (ref 0.0–0.2)

## 2022-10-28 LAB — URINALYSIS, ROUTINE W REFLEX MICROSCOPIC
Bilirubin Urine: NEGATIVE
Glucose, UA: NEGATIVE mg/dL
Hgb urine dipstick: NEGATIVE
Ketones, ur: NEGATIVE mg/dL
Leukocytes,Ua: NEGATIVE
Nitrite: NEGATIVE
Protein, ur: NEGATIVE mg/dL
Specific Gravity, Urine: 1.011 (ref 1.005–1.030)
pH: 7 (ref 5.0–8.0)

## 2022-10-28 LAB — MAGNESIUM: Magnesium: 2.2 mg/dL (ref 1.7–2.4)

## 2022-10-28 LAB — LIPASE, BLOOD: Lipase: 12 U/L (ref 11–51)

## 2022-10-28 LAB — GLUCOSE, CAPILLARY
Glucose-Capillary: 110 mg/dL — ABNORMAL HIGH (ref 70–99)
Glucose-Capillary: 90 mg/dL (ref 70–99)

## 2022-10-28 LAB — HEPARIN LEVEL (UNFRACTIONATED): Heparin Unfractionated: >1.1 [IU]/mL — ABNORMAL HIGH (ref 0.30–0.70)

## 2022-10-28 LAB — HEMOGLOBIN A1C
Hgb A1c MFr Bld: 7.1 % — ABNORMAL HIGH (ref 4.8–5.6)
Mean Plasma Glucose: 157.07 mg/dL

## 2022-10-28 LAB — APTT: aPTT: 28 seconds (ref 24–36)

## 2022-10-28 MED ORDER — IOHEXOL 300 MG/ML  SOLN
100.0000 mL | Freq: Once | INTRAMUSCULAR | Status: AC | PRN
  Administered 2022-10-28: 75 mL via INTRAVENOUS

## 2022-10-28 MED ORDER — POTASSIUM CHLORIDE CRYS ER 20 MEQ PO TBCR
40.0000 meq | EXTENDED_RELEASE_TABLET | Freq: Once | ORAL | Status: AC
  Administered 2022-10-28: 40 meq via ORAL
  Filled 2022-10-28: qty 2

## 2022-10-28 MED ORDER — ACETAMINOPHEN 650 MG RE SUPP: 650.0000 mg | Freq: Four times a day (QID) | RECTAL | Status: DC | PRN

## 2022-10-28 MED ORDER — SODIUM CHLORIDE 0.9% FLUSH
3.0000 mL | Freq: Two times a day (BID) | INTRAVENOUS | Status: DC
  Administered 2022-10-28 (×2): 3 mL via INTRAVENOUS

## 2022-10-28 MED ORDER — INSULIN ASPART 100 UNIT/ML IJ SOLN: 0.0000 [IU] | INTRAMUSCULAR | Status: DC

## 2022-10-28 MED ORDER — HEPARIN (PORCINE) 25000 UT/250ML-% IV SOLN
1100.0000 [IU]/h | INTRAVENOUS | Status: AC
  Administered 2022-10-28: 1100 [IU]/h via INTRAVENOUS
  Filled 2022-10-28: qty 250

## 2022-10-28 MED ORDER — ONDANSETRON HCL 4 MG PO TABS: 4.0000 mg | ORAL_TABLET | Freq: Four times a day (QID) | ORAL | Status: DC | PRN

## 2022-10-28 MED ORDER — ACETAMINOPHEN 325 MG PO TABS: 650.0000 mg | ORAL_TABLET | Freq: Four times a day (QID) | ORAL | Status: DC | PRN

## 2022-10-28 MED ORDER — POTASSIUM CHLORIDE IN NACL 20-0.9 MEQ/L-% IV SOLN
INTRAVENOUS | Status: DC
  Filled 2022-10-28 (×8): qty 1000

## 2022-10-28 MED ORDER — LACTATED RINGERS IV BOLUS
1000.0000 mL | Freq: Once | INTRAVENOUS | Status: AC
  Administered 2022-10-28: 1000 mL via INTRAVENOUS

## 2022-10-28 MED ORDER — ENOXAPARIN SODIUM 40 MG/0.4ML IJ SOSY: 40.0000 mg | PREFILLED_SYRINGE | INTRAMUSCULAR | Status: DC

## 2022-10-28 MED ORDER — HYDRALAZINE HCL 20 MG/ML IJ SOLN: 10.0000 mg | Freq: Four times a day (QID) | INTRAMUSCULAR | Status: DC | PRN

## 2022-10-28 MED ORDER — ALUM & MAG HYDROXIDE-SIMETH 200-200-20 MG/5ML PO SUSP
30.0000 mL | Freq: Once | ORAL | Status: AC
  Administered 2022-10-28: 30 mL via ORAL
  Filled 2022-10-28: qty 30

## 2022-10-28 MED ORDER — ONDANSETRON HCL 4 MG/2ML IJ SOLN: 4.0000 mg | Freq: Four times a day (QID) | INTRAMUSCULAR | Status: DC | PRN

## 2022-10-28 MED ORDER — FAMOTIDINE 20 MG PO TABS
20.0000 mg | ORAL_TABLET | Freq: Once | ORAL | Status: AC
  Administered 2022-10-28: 20 mg via ORAL
  Filled 2022-10-28: qty 1

## 2022-10-28 MED ORDER — ONDANSETRON HCL 4 MG/2ML IJ SOLN
4.0000 mg | Freq: Once | INTRAMUSCULAR | Status: AC
  Administered 2022-10-28: 4 mg via INTRAVENOUS
  Filled 2022-10-28: qty 2

## 2022-10-28 NOTE — ED Notes (Signed)
Pt given fluids... Pt had no current N/V.Marland KitchenMarland Kitchen

## 2022-10-28 NOTE — Discharge Instructions (Addendum)
It was our pleasure to provide your ER care today - we hope that you feel better.  Drink plenty of fluids/stay well hydrated.   Follow up with primary care doctor in the next couple days for recheck if symptoms fail to improve/resolve. Also follow up with primary care doctor regarding your blood pressure that is high today.  From today's labs, your potassium level is mildly low - eat plenty of fruits and vegetables, and follow up with primary care doctor.   Return to ER right If worse, new symptoms, fevers, new or worsening or severe abdominal pain, persistent vomiting, chest pain, trouble breathing, or other concern.

## 2022-10-28 NOTE — ED Notes (Signed)
Catherine Snyder with cl called for transport

## 2022-10-28 NOTE — Plan of Care (Signed)

## 2022-10-28 NOTE — ED Notes (Signed)
Report given to [facility] 

## 2022-10-28 NOTE — ED Triage Notes (Signed)
Pt arrived POV from home, caox4, ambulatory c/o nausea stating she's "felt sick to her stomach since eating dried mango last night." Pt denies vomiting, states she's had "some diarrhea" described as soft BM. Pt initially denies abd pain, CP or any pain.

## 2022-10-28 NOTE — ED Notes (Signed)
PT aware of the need for a urine... Unable to currently provide the sample.Marland KitchenMarland Kitchen

## 2022-10-28 NOTE — ED Notes (Signed)
Report given to the floor RN.

## 2022-10-28 NOTE — ED Provider Notes (Addendum)
[place] [facility] AT [facility] Provider Note   CSN: [id number] Arrival date & time: 10/28/22  5284     History  Chief Complaint  Patient presents with   Nausea   Abdominal Pain   Emesis    Catherine Snyder is a 82 y.o. female.  Pt c/o nausea, spitting up small amount clear emesis, and abdominal pain. Pt indicates symptoms started last evening after eating dried mango fruit snacks. No other new/different food ingestion. Denies ill contacts. Epigastric/mid abd discomfort/cramping. No severe/constant abd pain. No back/flank pain. No dsyuria and hematuria. Had bm in past day. No rectal bleeding or melena. No vaginal bleeding or discharge. No chest pain or discomfort. No sob. No abd distension. No fever or chills.   The history is provided by the patient and medical records.       Home Medications Prior to Admission medications   Medication Sig Start Date End Date Taking? Authorizing Provider  acetaminophen (TYLENOL) 500 MG tablet Take 500 mg by mouth every 6 (six) hours as needed for moderate pain.    [provider]  amLODipine (NORVASC) 10 MG tablet TAKE 1 TABLET BY MOUTH EVERY DAY 07/05/22   Ngetich, Dinah C, NP  Ascorbic Acid (VITAMIN C) 1000 MG tablet Take 1,000 mg by mouth daily.    [provider]  atorvastatin (LIPITOR) 80 MG tablet Take 1 tablet (80 mg total) by mouth at bedtime. 06/21/22   Ngetich, Dinah C, NP  diclofenac Sodium (VOLTAREN) 1 % GEL Apply 2 g topically as needed.    [provider]  lisinopril (ZESTRIL) 2.5 MG tablet Take 1 tablet (2.5 mg total) by mouth daily. 06/15/22   Ngetich, Dinah C, NP  rivaroxaban (XARELTO) 20 MG TABS tablet TAKE ONE TABLET BY MOUTH ONCE DAILY WITH SUPPER 10/27/21   Ngetich, Dinah C, NP  rOPINIRole (REQUIP) 0.5 MG tablet TAKE 1 TABLET BY MOUTH EVERYDAY AT BEDTIME 06/15/22   Ngetich, Dinah C, NP      Allergies    Patient has no known allergies.    Review of Systems   Review of Systems   Constitutional:  Negative for chills and fever.  HENT:  Negative for sore throat.   Respiratory:  Negative for shortness of breath.   Cardiovascular:  Negative for chest pain.  Gastrointestinal:  Positive for abdominal pain, nausea and vomiting. Negative for blood in stool and constipation.  Genitourinary:  Negative for dysuria and flank pain.  Musculoskeletal:  Negative for back pain.  Skin:  Negative for rash.  Neurological:  Negative for headaches.    Physical Exam Updated Vital Signs BP (!) 168/92 (BP Location: Right Arm)   Pulse 60   Temp 98.1 F (36.7 C) (Oral)   Resp 16   SpO2 100%  Physical Exam Vitals and nursing note reviewed.  Constitutional:      Appearance: Normal appearance. She is well-developed.  HENT:     Head: Atraumatic.     Nose: Nose normal.     Mouth/Throat:     Mouth: Mucous membranes are moist.  Eyes:     General: No scleral icterus.    Conjunctiva/sclera: Conjunctivae normal.  Neck:     Trachea: No tracheal deviation.  Cardiovascular:     Rate and Rhythm: Normal rate and regular rhythm.     Pulses: Normal pulses.     Heart sounds: Normal heart sounds. No murmur heard.    No friction rub. No gallop.  Pulmonary:     Effort:  Pulmonary effort is normal. No respiratory distress.     Breath sounds: Normal breath sounds.  Abdominal:     General: Bowel sounds are normal. There is distension.     Palpations: Abdomen is soft.     Tenderness: There is abdominal tenderness. There is no guarding.     Comments: Mild mid abd and epigastric tenderness. Mild distension.   Genitourinary:    Comments: No cva tenderness.  Musculoskeletal:        General: No swelling or tenderness.     Cervical back: Normal range of motion and neck supple. No rigidity. No muscular tenderness.  Skin:    General: Skin is warm and dry.     Findings: No rash.  Neurological:     Mental Status: She is alert.     Comments: Alert, speech normal.   Psychiatric:        Mood and  Affect: Mood normal.     ED Results / Procedures / Treatments   Labs (all labs ordered are listed, but only abnormal results are displayed) Results for orders placed or performed during the hospital encounter of 10/28/22  CBC  Result Value Ref Range   WBC 6.1 4.0 - 10.5 K/uL   RBC 4.34 3.87 - 5.11 MIL/uL   Hemoglobin 12.6 12.0 - 15.0 g/dL   HCT 60.4 54.0 - 98.1 %   MCV 87.8 80.0 - 100.0 fL   MCH 29.0 26.0 - 34.0 pg   MCHC 33.1 30.0 - 36.0 g/dL   RDW 19.1 47.8 - 29.5 %   Platelets 296 150 - 400 K/uL   nRBC 0.0 0.0 - 0.2 %  Comprehensive metabolic panel  Result Value Ref Range   Sodium 141 135 - 145 mmol/L   Potassium 3.3 (L) 3.5 - 5.1 mmol/L   Chloride 105 98 - 111 mmol/L   CO2 23 22 - 32 mmol/L   Glucose, Bld 163 (H) 70 - 99 mg/dL   BUN 9 8 - 23 mg/dL   Creatinine, Ser 6.21 0.44 - 1.00 mg/dL   Calcium 30.8 8.9 - 65.7 mg/dL   Total Protein 8.5 (H) 6.5 - 8.1 g/dL   Albumin 4.3 3.5 - 5.0 g/dL   AST 23 15 - 41 U/L   ALT 14 0 - 44 U/L   Alkaline Phosphatase 103 38 - 126 U/L   Total Bilirubin 0.8 0.3 - 1.2 mg/dL   GFR, Estimated >84 >69 mL/min   Anion gap 13 5 - 15  Lipase, blood  Result Value Ref Range   Lipase 12 11 - 51 U/L  Urinalysis, Routine w reflex microscopic -Urine, Clean Catch  Result Value Ref Range   Color, Urine YELLOW YELLOW   APPearance CLEAR CLEAR   Specific Gravity, Urine 1.011 1.005 - 1.030   pH 7.0 5.0 - 8.0   Glucose, UA NEGATIVE NEGATIVE mg/dL   Hgb urine dipstick NEGATIVE NEGATIVE   Bilirubin Urine NEGATIVE NEGATIVE   Ketones, ur NEGATIVE NEGATIVE mg/dL   Protein, ur NEGATIVE NEGATIVE mg/dL   Nitrite NEGATIVE NEGATIVE   Leukocytes,Ua NEGATIVE NEGATIVE    EKG None  Radiology CT ABDOMEN PELVIS W CONTRAST  Result Date: 10/28/2022 CLINICAL DATA:  Pain.  Vomiting EXAM: CT ABDOMEN AND PELVIS WITH CONTRAST TECHNIQUE: Multidetector CT imaging of the abdomen and pelvis was performed using the standard protocol following bolus administration of  intravenous contrast. RADIATION DOSE REDUCTION: This exam was performed according to the departmental dose-optimization program which includes automated exposure control, adjustment of the mA  and/or kV according to patient size and/or use of iterative reconstruction technique. CONTRAST:  75mL OMNIPAQUE IOHEXOL 300 MG/ML  SOLN COMPARISON:  CT 06/05/2015 FINDINGS: Lower chest: There is some linear opacity lung bases likely scar or atelectasis. No pleural effusion. Heart is borderline in size. Coronary artery calcifications are seen. Small pericardial effusion. Small hiatal hernia. Hepatobiliary: Preserved hepatic parenchyma. Patent portal vein. There is a dilated gallbladder. If there is concern of gallbladder pathology, ultrasound may be useful as the next step in the workup when clinically appropriate. Pancreas: Unremarkable. No pancreatic ductal dilatation or surrounding inflammatory changes. Spleen: Normal in size without focal abnormality. Adrenals/Urinary Tract: Adrenal glands are preserved. No enhancing renal mass or collecting system dilatation. There is a duplicated right renal collecting system. The ureters have normal course and caliber extending down to the bladder Stomach/Bowel: Stomach is nondilated. Large bowel has a normal course and caliber with scattered diffuse colonic stool. There are a few redundant courses to the colon. Normal appendix. Small bowel is fluid-filled and distended with some mild areas of dilatation measuring up to 3 cm. There are some areas of small bowel stool appearance as well. There is a transition to collapsed small bowel in the left hemipelvis as seen on coronal series 5, image 44, axial series 2, image 60. Please correlate for developing or partial obstruction. Mild mesenteric stranding. No pneumatosis. Vascular/Lymphatic: Aortic atherosclerosis. No enlarged abdominal or pelvic lymph nodes. Reproductive: Uterus and bilateral adnexa are unremarkable. Other: No free intra-air.  Musculoskeletal: Moderate degenerative changes of the spine and pelvis. Transitional lumbosacral segment. Osteopenia. Multilevel stenosis along the lumbar spine with disc bulging and osteophytes. Particularly at L3-4 and L4-5. IMPRESSION: Mildly dilated fluid-filled loops of small bowel with a transition of small bowel in the left hemipelvis. A developing or partial obstruction is possible. There are some stranding. No pneumatosis or free air. Distended gallbladder. If there is concern of acute gallbladder pathology ultrasound can be performed when clinically appropriate. Multilevel degenerative changes along the spine with the areas of stenosis in the lower lumbar spine. Please correlate with any symptoms. Electronically Signed   By: Karen Kays M.D.   On: 10/28/2022 11:10    Procedures Procedures    Medications Ordered in ED Medications  lactated ringers bolus 1,000 mL (0 mLs Intravenous Stopped 10/28/22 0845)  ondansetron (ZOFRAN) injection 4 mg (4 mg Intravenous Given 10/28/22 0801)  alum & mag hydroxide-simeth (MAALOX/MYLANTA) 200-200-20 MG/5ML suspension 30 mL (30 mLs Oral Given 10/28/22 0811)  famotidine (PEPCID) tablet 20 mg (20 mg Oral Given 10/28/22 0810)  potassium chloride SA (KLOR-CON M) CR tablet 40 mEq (40 mEq Oral Given 10/28/22 0929)  iohexol (OMNIPAQUE) 300 MG/ML solution 100 mL (75 mLs Intravenous Contrast Given 10/28/22 1014)    ED Course/ Medical Decision Making/ A&P                                 Medical Decision Making Problems Addressed: Elevated blood pressure reading: acute illness or injury Essential hypertension: chronic illness or injury with exacerbation, progression, or side effects of treatment that poses a threat to life or bodily functions Hypokalemia: acute illness or injury Nausea and vomiting in adult: acute illness or injury with systemic symptoms that poses a threat to life or bodily functions Small bowel obstruction, partial (HCC): acute illness or injury  with systemic symptoms that poses a threat to life or bodily functions Upper abdominal pain: acute illness or  injury with systemic symptoms that poses a threat to life or bodily functions  Amount and/or Complexity of Data Reviewed External Data Reviewed: notes. Labs: ordered. Decision-making details documented in ED Course. Radiology: ordered.  Risk OTC drugs. Prescription drug management. Decision regarding hospitalization.   Iv ns. Continuous pulse ox and cardiac monitoring. Labs ordered/sent.  Differential diagnosis includes gastroenteritis, gastritis, pud, pancreatitis, etc. Dispo decision including potential need for admission considered - will get labs  and reassess.   Reviewed nursing notes and prior charts for additional history. External reports reviewed.   LR bolus. Zofran iv. Pepcid iv, maalox po.   Cardiac monitor: sinus rhythm, rate 60.  Labs reviewed/interpreted by me - wbc and hgb normal. Lipase normal. K sl low.   Kcl po. Po fluids.  W trial of po fluids, recurrent nv - will get CT.  CT reviewed/interpreted by me - dilated loops sb, ?early/partial sbo.  Pt with no ruq pain or tenderness, no fever.   Will consult medicine for admission. Discussed pt with Dr Erenest Blank - will admit. Request gen surg be consulted - gen surgery consulted.            Final Clinical Impression(s) / ED Diagnoses Final diagnoses:  Upper abdominal pain  Nausea and vomiting in adult  Elevated blood pressure reading  Essential hypertension  Hypokalemia  Small bowel obstruction, partial (HCC)    Rx / DC Orders ED Discharge Orders     None         Cathren Laine, MD 10/28/22 1159

## 2022-10-28 NOTE — H&P (Signed)
History and Physical    Patient: Catherine Snyder:[id number] DOB: Aug 09, 1940 DOA: 10/28/2022 DOS: the patient was seen and examined on 10/28/2022 PCP: Ngetich, Donalee Citrin, NP  Patient coming from: Home-  Chief Complaint:  Chief Complaint  Patient presents with   Nausea   Abdominal Pain   Emesis   HPI: [name] is a 82 y.o. female with medical history significant of hypertension, pulmonary hypertension in the context of bilateral saddle pulmonary embolus, CAD, chronic anticoagulation for history of PE, iron deficiency anemia and diabetes mellitus 2.  Patient had been in her usual state of health when she became nauseated after eating dried mango the night before.  She has not had any vomiting home but by the time she arrived to the [facility] she emesis.  Prior to coming in she did pass 1 small bowel movement but has not had any flatus since that.  She has not had any recent constitutional symptoms and she is currently not having any belly pain.  She has had no prior abdominal surgeries and has not had any bowel disease such as Crohn's, ulcerative colitis or diverticular disease.  In the ED he was afebrile, mildly Chane her sats were 98.  She appeared dehydrated so she was given 1 L.  She was mildly hypokalemic with a potassium of 3.3, LFTs were normal, creatinine within normal limits, no leukocytosis and platelets were normal.  CT of the abdomen pelvis revealed mildly dilated fluid-filled loops of small bowel with a transition zone of the small bowel in the left hemipelvis with a developing or partial bowel obstruction.  There was a small amount of stranding but no pneumatosis or free the gallbladder was also.  Hospitalist service was consulted for admission.   Review of Systems: As mentioned in the history of present illness. All other systems reviewed and are negative. Past Medical History:  Diagnosis Date   Absolute anemia    CAD in native artery    40% LAD at  cath 02/04/16   Chronic anticoagulation 03/02/2016   Demand ischemia of myocardium - Not NSTEMI 02/03/2016   Dyslipidemia 03/02/2016   Dyspnea on exertion    Elevated troponin    Essential hypertension 11/27/2014   Frequency of urination    Hematuria    History of bladder cancer    a. 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   RLS (restless legs syndrome) 05/21/2016   Saddle embolus of pulmonary artery without acute cor pulmonale    Acute saddle PE treated with direct thrombolytics 02/07/16   Wears glasses    Wears partial dentures    upper   Past Surgical History:  Procedure Laterality Date   CARDIAC CATHETERIZATION N/A 02/04/2016   Procedure: RIGHT/LEFT HEART CATH AND CORONARY ANGIOGRAPHY;  Surgeon: Lennette Bihari, MD;  Location: [facility];  Service: Cardiovascular;  Laterality: N/A;   CYSTOSCOPY W/ RETROGRADES Bilateral 07/07/2015   Procedure: CYSTOSCOPY WITH RETROGRADE PYELOGRAM;  Surgeon: Malen Gauze, MD;  Location: [facility];  Service: Urology;  Laterality: Bilateral;   IR GENERIC HISTORICAL  02/07/2016   IR ANGIOGRAM SELECTIVE EACH ADDITIONAL VESSEL 02/07/2016 Malachy Moan, MD [facility]   IR GENERIC HISTORICAL  02/07/2016   IR US GUIDE VASC ACCESS RIGHT 02/07/2016 Malachy Moan, MD [facility]   IR GENERIC HISTORICAL  02/07/2016   IR ANGIOGRAM PULMONARY BILATERAL SELECTIVE 02/07/2016 Malachy Moan, MD [facility]   IR GENERIC HISTORICAL  02/07/2016   IR INFUSION THROMBOL ARTERIAL INITIAL (MS) 02/07/2016 Malachy Moan, MD [facility]   IR GENERIC HISTORICAL  02/07/2016   IR INFUSION THROMBOL ARTERIAL INITIAL (MS) 02/07/2016 Malachy Moan, MD [facility]   IR GENERIC HISTORICAL  02/07/2016   IR ANGIOGRAM  SELECTIVE EACH ADDITIONAL VESSEL 02/07/2016 Malachy Moan, MD [facility]   NO PAST SURGERIES     TRANSURETHRAL RESECTION OF BLADDER TUMOR WITH GYRUS (TURBT-GYRUS) N/A 07/07/2015   Procedure: TRANSURETHRAL RESECTION OF BLADDER TUMOR WITH GYRUS (TURBT-GYRUS);  Surgeon: Malen Gauze, MD;  Location: [facility];  Service: Urology;  Laterality: N/A;   Social History:  reports that she has never smoked. She has never used smokeless tobacco. She reports that she does not drink alcohol and does not use drugs.  No Known Allergies  Family History  Problem Relation Age of Onset   CAD Mother 40   Diabetes Mother    Seizures Mother    Heart disease Father 79   Seizures Brother    CAD Brother 35    Prior to Admission medications   Medication Sig Start Date End Date Taking? Authorizing Provider  acetaminophen (TYLENOL) 500 MG tablet Take 500 mg by mouth every 6 (six) hours as needed for moderate pain.   Yes [provider]  amLODipine (NORVASC) 10 MG tablet TAKE 1 TABLET BY MOUTH EVERY DAY 07/05/22  Yes Ngetich, Dinah C, NP  Ascorbic Acid (VITAMIN C) 1000 MG tablet Take 1,000 mg by mouth daily.   Yes [provider]  atorvastatin (LIPITOR) 80 MG tablet Take 1 tablet (80 mg total) by mouth at bedtime. 06/21/22  Yes Ngetich, Dinah C, NP  rivaroxaban (XARELTO) 20 MG TABS tablet TAKE ONE TABLET BY MOUTH ONCE DAILY WITH SUPPER 10/27/21  Yes Ngetich, Dinah C, NP  rOPINIRole (REQUIP) 0.5 MG tablet TAKE 1 TABLET BY MOUTH EVERYDAY AT BEDTIME 06/15/22  Yes Ngetich, Dinah C, NP  lisinopril (ZESTRIL) 2.5 MG tablet Take 1 tablet (2.5 mg total) by mouth daily. Patient not taking: Reported on 10/28/2022 06/15/22   Ngetich, Donalee Citrin, NP    Physical Exam: Vitals:   10/28/22 0730 10/28/22 0930 10/28/22 1000 10/28/22 1431  BP: (!) 161/89 (!) 158/87 (!) 168/92 137/68  Pulse: (!) 57 (!) 59 60 (!) 50  Resp: 20 18 16 18   Temp: 98.1 F (36.7 C)   99.2 F (37.3 C)  TempSrc: Oral    Oral  SpO2: 98% 99% 100% 98%  Weight:    69.1 kg  Height:    5\' 3"  (1.6 m)   Constitutional: NAD, calm, comfortable Respiratory: clear to auscultation bilaterally, no wheezing, no crackles. Normal respiratory effort. No accessory muscle use.  Cardiovascular: Regular rate and rhythm, no murmurs / rubs / gallops. No extremity edema. 2+ pedal pulses.   Abdomen: no tenderness, no masses palpated. No hepatosplenomegaly. Bowel sounds very scattered and hypoactive.  She has not passed any flatus.  Musculoskeletal: no clubbing / cyanosis. No joint deformity upper and lower extremities. Good ROM, no contractures. Normal muscle tone.  Skin: no rashes, lesions, ulcers. No induration Neurologic: CN 2-12 grossly intact. Sensation intact, DTR normal. Strength 5/5 x all 4 extremities.  Psychiatric: Patient does have some minor short-term memory deficits at bedside had to correct her regarding some of her recent history when she was asked alert  and oriented x 3. Normal mood.     Data Reviewed:  As per HPI  Assessment and Plan: Partial small bowel obstruction No further emesis.  Abdomen is soft nondistended although bowel sounds are No indication at this time to place an NG Continue bowel rest and IV fluid hydration Notify surgical team of arrival  Mild hypokalemia Likely from GI losses Maintenance fluid with potassium  History of PE on Xarelto Hold Xarelto in favor of IV heparin with pharmacy to dose  Hypertension Blood pressure elevated Utilize as needed Apresoline until able to tolerate oral medications Norvasc home dose 10 mg daily in addition to lisinopril 2.5 mg daily  Diabetes mellitus 2 Follow CBGs and provide SSI Check hemoglobin A1c  History of CAD/HLD 40% LAD at cath 02/04/16  On Lipitor 80 mg daily at home  History of iron deficiency anemia Hemoglobin stable with baseline between 11 and 12  Restless leg syndrome On Requip 0.5 mg at bedtime    Advance Care Planning:    Code Status: DNR   VTE prophylaxis: Heparin infusion per pharmacy  Consults: General Surgery  Family Communication: Daughter at bedside  Severity of Illness: The appropriate patient status for this patient is OBSERVATION. Observation status is judged to be reasonable and necessary in order to provide the required intensity of service to ensure the patient's safety. The patient's presenting symptoms, physical exam findings, and initial radiographic and laboratory data in the context of their medical condition is felt to place them at decreased risk for further clinical deterioration. Furthermore, it is anticipated that the patient will be medically stable for discharge from the hospital within 2 midnights of admission.   Author: Junious Silk, NP 10/28/2022 4:09 PM  For on call review [email or web address].

## 2022-10-28 NOTE — Progress Notes (Signed)
ANTICOAGULATION CONSULT NOTE - Initial Consult  Pharmacy Consult for Heparin  Indication: Hx pulmonary embolus (PTA Xarelto held)  No Known Allergies  Patient Measurements: Height: 5\' 3"  (160 cm) Weight: 69.1 kg (152 lb 5.4 oz) IBW/kg (Calculated) : 52.4 Heparin Dosing Weight: 67 kg  Vital Signs: Temp: 99.2 F (37.3 C) (08/15 1431) Temp Source: Oral (08/15 1431) BP: 137/68 (08/15 1431) Pulse Rate: 50 (08/15 1431)  Labs: Recent Labs    10/28/22 0740  HGB 12.6  HCT 38.1  PLT 296  CREATININE 0.73    Estimated Creatinine Clearance: 52.3 mL/min (by C-G formula based on SCr of 0.73 mg/dL).   Medical History: Past Medical History:  Diagnosis Date   Absolute anemia    CAD in native artery    40% LAD at cath 02/04/16   Chronic anticoagulation 03/02/2016   Demand ischemia of myocardium - Not NSTEMI 02/03/2016   Dyslipidemia 03/02/2016   Dyspnea on exertion    Elevated troponin    Essential hypertension 11/27/2014   Frequency of urination    Hematuria    History of bladder cancer    a. 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   RLS (restless legs syndrome) 05/21/2016   Saddle embolus of pulmonary artery without acute cor pulmonale    Acute saddle PE treated with direct thrombolytics 02/07/16   Wears glasses    Wears partial dentures    upper    Medications:  Medications Prior to Admission  Medication Sig Dispense Refill Last Dose   acetaminophen (TYLENOL) 500 MG tablet Take 500 mg by mouth every 6 (six) hours as needed for moderate pain.      amLODipine (NORVASC) 10 MG tablet TAKE 1 TABLET BY MOUTH EVERY DAY 90 tablet 1 10/27/2022   Ascorbic Acid (VITAMIN C) 1000 MG tablet Take 1,000 mg by mouth daily.   10/27/2022   atorvastatin (LIPITOR) 80 MG tablet Take 1  tablet (80 mg total) by mouth at bedtime. 90 tablet 2 10/27/2022   rivaroxaban (XARELTO) 20 MG TABS tablet TAKE ONE TABLET BY MOUTH ONCE DAILY WITH SUPPER 90 tablet 3 10/27/2022   rOPINIRole (REQUIP) 0.5 MG tablet TAKE 1 TABLET BY MOUTH EVERYDAY AT BEDTIME 90 tablet 2 10/27/2022   lisinopril (ZESTRIL) 2.5 MG tablet Take 1 tablet (2.5 mg total) by mouth daily. (Patient not taking: Reported on 10/28/2022) 90 tablet 1 Not Taking   Scheduled:   insulin aspart  0-9 Units Subcutaneous Q4H   sodium chloride flush  3 mL Intravenous Q12H   Infusions:   0.9 % NaCl with KCl 20 mEq / L      Assessment: 81 yoF presents to ED on 8/15 with N/V and abdominal pain.  CT shows possible developing or partial obstruction.  Pharmacy is consulted to dose Heparin while PTA xarelto for history of PE (2019) is on hold.  PTA Xarelto 20mg  PO daily with supper, last dose on 8/14 at 22:00  Today, 10/28/2022: Baseline aPTT and HL in process.   CBC: Hgb 12.6, Plt WNL SCr 0.73 No s/s bleeding reported  Goal of Therapy:  Heparin level 0.3-0.7 units/ml aPTT 66-102 seconds Monitor platelets by anticoagulation protocol: Yes   Plan:  No bolus d/t recent DOAC use. Start heparin IV infusion at 1100 units/hr at 22:00 tonight aPTT 8 hours after starting  Daily heparin level and CBC   Lynann Beaver PharmD, BCPS [facility] main pharmacy [phone] 10/28/2022 3:03 PM

## 2022-10-29 ENCOUNTER — Observation Stay (HOSPITAL_COMMUNITY): Payer: PPO

## 2022-10-29 DIAGNOSIS — I272 Pulmonary hypertension, unspecified: Secondary | ICD-10-CM | POA: Diagnosis not present

## 2022-10-29 DIAGNOSIS — R112 Nausea with vomiting, unspecified: Secondary | ICD-10-CM | POA: Diagnosis not present

## 2022-10-29 DIAGNOSIS — I2510 Atherosclerotic heart disease of native coronary artery without angina pectoris: Secondary | ICD-10-CM | POA: Diagnosis not present

## 2022-10-29 DIAGNOSIS — K56600 Partial intestinal obstruction, unspecified as to cause: Secondary | ICD-10-CM | POA: Diagnosis not present

## 2022-10-29 DIAGNOSIS — Z79899 Other long term (current) drug therapy: Secondary | ICD-10-CM | POA: Diagnosis not present

## 2022-10-29 DIAGNOSIS — Z833 Family history of diabetes mellitus: Secondary | ICD-10-CM | POA: Diagnosis not present

## 2022-10-29 DIAGNOSIS — E119 Type 2 diabetes mellitus without complications: Secondary | ICD-10-CM | POA: Diagnosis not present

## 2022-10-29 DIAGNOSIS — E876 Hypokalemia: Secondary | ICD-10-CM | POA: Diagnosis not present

## 2022-10-29 DIAGNOSIS — G2581 Restless legs syndrome: Secondary | ICD-10-CM | POA: Diagnosis not present

## 2022-10-29 DIAGNOSIS — E860 Dehydration: Secondary | ICD-10-CM | POA: Diagnosis not present

## 2022-10-29 DIAGNOSIS — Z7901 Long term (current) use of anticoagulants: Secondary | ICD-10-CM | POA: Diagnosis not present

## 2022-10-29 DIAGNOSIS — D509 Iron deficiency anemia, unspecified: Secondary | ICD-10-CM | POA: Diagnosis not present

## 2022-10-29 DIAGNOSIS — Z86711 Personal history of pulmonary embolism: Secondary | ICD-10-CM | POA: Diagnosis not present

## 2022-10-29 DIAGNOSIS — Z8249 Family history of ischemic heart disease and other diseases of the circulatory system: Secondary | ICD-10-CM | POA: Diagnosis not present

## 2022-10-29 DIAGNOSIS — Z66 Do not resuscitate: Secondary | ICD-10-CM | POA: Diagnosis not present

## 2022-10-29 DIAGNOSIS — K56609 Unspecified intestinal obstruction, unspecified as to partial versus complete obstruction: Secondary | ICD-10-CM | POA: Diagnosis not present

## 2022-10-29 DIAGNOSIS — Z8551 Personal history of malignant neoplasm of bladder: Secondary | ICD-10-CM | POA: Diagnosis not present

## 2022-10-29 DIAGNOSIS — E785 Hyperlipidemia, unspecified: Secondary | ICD-10-CM | POA: Diagnosis not present

## 2022-10-29 DIAGNOSIS — I10 Essential (primary) hypertension: Secondary | ICD-10-CM | POA: Diagnosis not present

## 2022-10-29 LAB — CBC
HCT: 34.3 % — ABNORMAL LOW (ref 36.0–46.0)
Hemoglobin: 10.8 g/dL — ABNORMAL LOW (ref 12.0–15.0)
MCH: 28.6 pg (ref 26.0–34.0)
MCHC: 31.5 g/dL (ref 30.0–36.0)
MCV: 91 fL (ref 80.0–100.0)
Platelets: 242 10*3/uL (ref 150–400)
RBC: 3.77 MIL/uL — ABNORMAL LOW (ref 3.87–5.11)
RDW: 15.8 % — ABNORMAL HIGH (ref 11.5–15.5)
WBC: 5.1 10*3/uL (ref 4.0–10.5)
nRBC: 0 % (ref 0.0–0.2)

## 2022-10-29 LAB — BASIC METABOLIC PANEL
Anion gap: 7 (ref 5–15)
BUN: 9 mg/dL (ref 8–23)
CO2: 22 mmol/L (ref 22–32)
Calcium: 8.6 mg/dL — ABNORMAL LOW (ref 8.9–10.3)
Chloride: 109 mmol/L (ref 98–111)
Creatinine, Ser: 0.74 mg/dL (ref 0.44–1.00)
GFR, Estimated: >60 mL/min (ref >60)
Glucose, Bld: 97 mg/dL (ref 70–99)
Potassium: 3.6 mmol/L (ref 3.5–5.1)
Sodium: 138 mmol/L (ref 135–145)

## 2022-10-29 LAB — GLUCOSE, CAPILLARY
Glucose-Capillary: 105 mg/dL — ABNORMAL HIGH (ref 70–99)
Glucose-Capillary: 123 mg/dL — ABNORMAL HIGH (ref 70–99)
Glucose-Capillary: 75 mg/dL (ref 70–99)
Glucose-Capillary: 79 mg/dL (ref 70–99)
Glucose-Capillary: 93 mg/dL (ref 70–99)
Glucose-Capillary: 97 mg/dL (ref 70–99)

## 2022-10-29 LAB — HEPARIN LEVEL (UNFRACTIONATED): Heparin Unfractionated: 1.05 [IU]/mL — ABNORMAL HIGH (ref 0.30–0.70)

## 2022-10-29 LAB — APTT: aPTT: 101 s — ABNORMAL HIGH (ref 24–36)

## 2022-10-29 MED ORDER — BISACODYL 10 MG RE SUPP
10.0000 mg | Freq: Once | RECTAL | Status: AC
  Administered 2022-10-29: 10 mg via RECTAL
  Filled 2022-10-29: qty 1

## 2022-10-29 MED ORDER — RIVAROXABAN 20 MG PO TABS
20.0000 mg | ORAL_TABLET | Freq: Every day | ORAL | Status: DC
  Administered 2022-10-29: 20 mg via ORAL
  Filled 2022-10-29: qty 1

## 2022-10-29 MED ORDER — BOOST / RESOURCE BREEZE PO LIQD CUSTOM
1.0000 | Freq: Three times a day (TID) | ORAL | Status: DC
  Administered 2022-10-29 – 2022-10-30 (×2): 1 via ORAL

## 2022-10-29 NOTE — Progress Notes (Signed)
PROGRESS NOTE    Catherine Snyder  YNW:[id number] DOB: January 15, 1941 DOA: 10/28/2022 PCP: Caesar Bookman, NP    Brief Narrative: 82 y.o. female with medical history significant of hypertension, pulmonary hypertension in the context of bilateral saddle pulmonary embolus, CAD, chronic anticoagulation for history of PE, iron deficiency anemia and diabetes mellitus 2.  Patient had been in her usual state of health when she became nauseated after eating dried mango the night before.  She has not had any vomiting home but by the time she arrived to the [facility] ED she emesis.  Prior to coming in she did pass 1 small bowel movement but has not had any flatus since that.  She has not had any recent constitutional symptoms and she is currently not having any belly pain.  She has had no prior abdominal surgeries and has not had any bowel disease such as Crohn's, ulcerative colitis or diverticular disease.   In the ED She appeared dehydrated so she was given 1 L.  She was mildly hypokalemic with a potassium of 3.3, LFTs were normal, creatinine within normal limits, no leukocytosis and platelets were normal.  CT of the abdomen pelvis revealed mildly dilated fluid-filled loops of small bowel with a transition zone of the small bowel in the left hemipelvis with a developing or partial bowel obstruction.  There was a small amount of stranding but no pneumatosis or free the gallbladder was also.  Hospitalist service was consulted for admission.  Assessment & Plan:   Principal Problem:   SBO (small bowel obstruction) (HCC)  Partial small bowel obstruction-no further vomiting overnight still has some nausea.  KUB shows improved bowels and constipation with heavy stool burden.  Will start her on clear liquids today.  Continue IV fluids.  Dulcolax x 1.  Ambulate every shift and out of bed.   Mild hypokalemia-resolved. Likely from GI losses Maintenance fluid with potassium   History of PE on  Xarelto   Hypertension blood pressure soft we will hold off on restarting Norvasc and lisinopril.   Diabetes mellitus 2 Follow CBGs and provide SSI Check hemoglobin A1c 7.1   History of CAD/HLD 40% LAD at cath 02/04/16  On Lipitor 80 mg daily at home   History of iron deficiency anemia Hemoglobin stable with baseline between 11 and 12   Restless leg syndrome On Requip 0.5 mg at bedtime   Estimated body mass index is 26.99 kg/m as calculated from the following:   Height as of this encounter: 5\' 3"  (1.6 m).   Weight as of this encounter: 69.1 kg.  DVT prophylaxis: Xarelto 2 Code Status: DNR  family Communication: Discussed with daughter  disposition Plan:  Status is: Inpatient Remains inpatient appropriate because: Bowel obstruction has been n.p.o. on IV fluids   Consultants:  None  Procedures: None Antimicrobials: None Subjective: Patient resting in bed denies abdominal pain still has some nausea but no vomiting overnight did not have a bowel movement  Objective: Vitals:   10/28/22 1841 10/28/22 2300 10/29/22 0326 10/29/22 0541  BP: 135/86 127/84 112/64 117/70  Pulse: (!) 57 66 (!) 56 (!) 56  Resp: 17 17 18 18   Temp: 98.6 F (37 C) 99 F (37.2 C) 98.8 F (37.1 C) 98.8 F (37.1 C)  TempSrc: Oral Oral Oral Oral  SpO2: 99% 98% 98% 99%  Weight:      Height:        Intake/Output Summary (Last 24 hours) at 10/29/2022 1201 Last data filed at 10/28/2022  1800 Gross per 24 hour  Intake 255.68 ml  Output --  Net 255.68 ml   Filed Weights   10/28/22 1431  Weight: 69.1 kg    Examination:  General exam: Appears calm and comfortable  Respiratory system: Clear to auscultation. Respiratory effort normal. Cardiovascular system: S1 & S2 heard, RRR. No JVD, murmurs, rubs, gallops or clicks. No pedal edema. Gastrointestinal system: Abdomen is nondistended, soft and nontender. No organomegaly or masses felt. Normal bowel sounds heard. Central nervous system: Alert and  oriented. No focal neurological deficits. Extremities: No edema    Data Reviewed: I have personally reviewed following labs and imaging studies  CBC: Recent Labs  Lab 10/28/22 0740 10/29/22 0533  WBC 6.1 5.1  HGB 12.6 10.8*  HCT 38.1 34.3*  MCV 87.8 91.0  PLT 296 242   Basic Metabolic Panel: Recent Labs  Lab 10/28/22 0740 10/28/22 1517 10/29/22 0533  NA 141  --  138  K 3.3*  --  3.6  CL 105  --  109  CO2 23  --  22  GLUCOSE 163*  --  97  BUN 9  --  9  CREATININE 0.73  --  0.74  CALCIUM 10.0  --  8.6*  MG  --  2.2  --    GFR: Estimated Creatinine Clearance: 52.3 mL/min (by C-G formula based on SCr of 0.74 mg/dL). Liver Function Tests: Recent Labs  Lab 10/28/22 0740  AST 23  ALT 14  ALKPHOS 103  BILITOT 0.8  PROT 8.5*  ALBUMIN 4.3   Recent Labs  Lab 10/28/22 0740  LIPASE 12   No results for input(s): "AMMONIA" in the last 168 hours. Coagulation Profile: No results for input(s): "INR", "PROTIME" in the last 168 hours. Cardiac Enzymes: No results for input(s): "CKTOTAL", "CKMB", "CKMBINDEX", "TROPONINI" in the last 168 hours. BNP (last 3 results) No results for input(s): "PROBNP" in the last 8760 hours. HbA1C: Recent Labs    10/28/22 1517  HGBA1C 7.1*   CBG: Recent Labs  Lab 10/28/22 2010 10/29/22 0006 10/29/22 0328 10/29/22 0742 10/29/22 1156  GLUCAP 90 97 105* 93 123*   Lipid Profile: No results for input(s): "CHOL", "HDL", "LDLCALC", "TRIG", "CHOLHDL", "LDLDIRECT" in the last 72 hours. Thyroid Function Tests: No results for input(s): "TSH", "T4TOTAL", "FREET4", "T3FREE", "THYROIDAB" in the last 72 hours. Anemia Panel: No results for input(s): "VITAMINB12", "FOLATE", "FERRITIN", "TIBC", "IRON", "RETICCTPCT" in the last 72 hours. Sepsis Labs: No results for input(s): "PROCALCITON", "LATICACIDVEN" in the last 168 hours.  No results found for this or any previous visit (from the past 240 hour(s)).       Radiology Studies: DG Abd 1  View  Result Date: 10/29/2022 CLINICAL DATA:  Intractable nausea and vomiting. EXAM: ABDOMEN - 1 VIEW COMPARISON:  CT AP from 10/28/2022 FINDINGS: Bowel gas pattern appears nonobstructed. No pathologic dilatation of the large or small bowel loops. Gas and stool noted within the colon up to the level of the rectum. IMPRESSION: Bowel gas pattern appears nonobstructed on today's exam with gas and stool noted up to the rectum without plain film radiograph evidence of small-bowel dilatation. Electronically Signed   By: Signa Kell M.D.   On: 10/29/2022 10:57   CT ABDOMEN PELVIS W CONTRAST  Result Date: 10/28/2022 CLINICAL DATA:  Pain.  Vomiting EXAM: CT ABDOMEN AND PELVIS WITH CONTRAST TECHNIQUE: Multidetector CT imaging of the abdomen and pelvis was performed using the standard protocol following bolus administration of intravenous contrast. RADIATION DOSE REDUCTION: This exam  was performed according to the departmental dose-optimization program which includes automated exposure control, adjustment of the mA and/or kV according to patient size and/or use of iterative reconstruction technique. CONTRAST:  75mL OMNIPAQUE IOHEXOL 300 MG/ML  SOLN COMPARISON:  CT 06/05/2015 FINDINGS: Lower chest: There is some linear opacity lung bases likely scar or atelectasis. No pleural effusion. Heart is borderline in size. Coronary artery calcifications are seen. Small pericardial effusion. Small hiatal hernia. Hepatobiliary: Preserved hepatic parenchyma. Patent portal vein. There is a dilated gallbladder. If there is concern of gallbladder pathology, ultrasound may be useful as the next step in the workup when clinically appropriate. Pancreas: Unremarkable. No pancreatic ductal dilatation or surrounding inflammatory changes. Spleen: Normal in size without focal abnormality. Adrenals/Urinary Tract: Adrenal glands are preserved. No enhancing renal mass or collecting system dilatation. 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 The ureters have normal course and caliber extending down to the bladder Stomach/Bowel: Stomach is nondilated. Large bowel has a normal course and caliber with scattered diffuse colonic stool. There are a few redundant courses to the colon. Normal appendix. Small bowel is fluid-filled and distended with some mild areas of dilatation measuring up to 3 cm. There are some areas of small bowel stool appearance as well. There is a transition to collapsed small bowel in the left hemipelvis as seen on coronal series 5, image 44, axial series 2, image 60. Please correlate for developing or partial obstruction. Mild mesenteric stranding. No pneumatosis. Vascular/Lymphatic: Aortic atherosclerosis. No enlarged abdominal or pelvic lymph nodes. Reproductive: Uterus and bilateral adnexa are unremarkable. Other: No free intra-air. Musculoskeletal: Moderate degenerative changes of the spine and pelvis. Transitional lumbosacral segment. Osteopenia. Multilevel stenosis along the lumbar spine with disc bulging and osteophytes. Particularly at L3-4 and L4-5. IMPRESSION: Mildly dilated fluid-filled loops of small bowel with a transition of small bowel in the left hemipelvis. A developing or partial obstruction is possible. There are some stranding. No pneumatosis or free air. Distended gallbladder. If there is concern of acute gallbladder pathology ultrasound can be performed when clinically appropriate. Multilevel degenerative changes along the spine with the areas of stenosis in the lower lumbar spine. Please correlate with any symptoms. Electronically Signed   By: Karen Kays M.D.   On: 10/28/2022 11:10     Scheduled Meds:  bisacodyl  10 mg Rectal Once   insulin aspart  0-9 Units Subcutaneous Q4H   sodium chloride flush  3 mL Intravenous Q12H   Continuous Infusions:  0.9 % NaCl with KCl 20 mEq / L 125 mL/hr at 10/28/22 2336   heparin 1,100 Units/hr (10/28/22 2141)     LOS: 0 days    Time spent: 38  minutes  Alwyn Ren, MD  10/29/2022, 12:01 PM

## 2022-10-29 NOTE — Plan of Care (Signed)

## 2022-10-29 NOTE — Progress Notes (Addendum)
ANTICOAGULATION CONSULT NOTE  Pharmacy Consult for Heparin  Indication: Hx pulmonary embolus (PTA Xarelto held)  No Known Allergies  Patient Measurements: Height: 5\' 3"  (160 cm) Weight: 69.1 kg (152 lb 5.4 oz) IBW/kg (Calculated) : 52.4 Heparin Dosing Weight: 67 kg  Vital Signs: Temp: 98.8 F (37.1 C) (08/16 0326) Temp Source: Oral (08/16 0326) BP: 112/64 (08/16 0326) Pulse Rate: 56 (08/16 0326)  Labs: Recent Labs    10/28/22 0740 10/28/22 1517 10/29/22 0533  HGB 12.6  --  10.8*  HCT 38.1  --  34.3*  PLT 296  --  242  APTT  --  28 101*  HEPARINUNFRC  --  >1.10* 1.05*  CREATININE 0.73  --  0.74    Estimated Creatinine Clearance: 52.3 mL/min (by C-G formula based on SCr of 0.74 mg/dL).   Medical History: Past Medical History:  Diagnosis Date   Absolute anemia    CAD in native artery    40% LAD at cath 02/04/16   Chronic anticoagulation 03/02/2016   Demand ischemia of myocardium - Not NSTEMI 02/03/2016   Dyslipidemia 03/02/2016   Dyspnea on exertion    Elevated troponin    Essential hypertension 11/27/2014   Frequency of urination    Hematuria    History of bladder cancer    a. 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   RLS (restless legs syndrome) 05/21/2016   Saddle embolus of pulmonary artery without acute cor pulmonale    Acute saddle PE treated with direct thrombolytics 02/07/16   Wears glasses    Wears partial dentures    upper     Assessment: 70 yoF presents to ED on 8/15 with N/V and abdominal pain.  CT shows possible developing or partial obstruction.  Pharmacy is consulted to dose Heparin while PTA xarelto for history of PE (2019) is on hold.  PTA Xarelto 20mg  PO daily with supper, last dose on 8/14 at 22:00  Today, 10/29/2022: -aPTT 101  - therapeutic with heparin infusion at 1100 units/hr -Heparin level 1.05 - elevated s/t DOAC use -Hgb slight drop, plt stable -No complications noted  Goal of Therapy:  Heparin level 0.3-0.7 units/ml aPTT 66-102 seconds Monitor platelets by anticoagulation protocol: Yes   Plan:  -Continue heparin infusion at 1100 units/hr -aPTT in 8 hrs to confirm -Daily heparin level and CBC  ADDENDUM 8/16 1300: Pharmacy consulted to resume PTA Xarelto. Order placed for Xarelto 20 mg daily. Heparin to stop at same time. Sign off consult, no further interventions expected.   Pricilla Riffle, PharmD, BCPS Clinical Pharmacist 10/29/2022 7:46 AM

## 2022-10-30 ENCOUNTER — Other Ambulatory Visit (HOSPITAL_BASED_OUTPATIENT_CLINIC_OR_DEPARTMENT_OTHER): Payer: Self-pay

## 2022-10-30 DIAGNOSIS — K56609 Unspecified intestinal obstruction, unspecified as to partial versus complete obstruction: Secondary | ICD-10-CM | POA: Diagnosis not present

## 2022-10-30 LAB — CBC
HCT: 35.2 % — ABNORMAL LOW (ref 36.0–46.0)
Hemoglobin: 11.2 g/dL — ABNORMAL LOW (ref 12.0–15.0)
MCH: 28.8 pg (ref 26.0–34.0)
MCHC: 31.8 g/dL (ref 30.0–36.0)
MCV: 90.5 fL (ref 80.0–100.0)
Platelets: 236 10*3/uL (ref 150–400)
RBC: 3.89 MIL/uL (ref 3.87–5.11)
RDW: 15.7 % — ABNORMAL HIGH (ref 11.5–15.5)
WBC: 3.9 10*3/uL — ABNORMAL LOW (ref 4.0–10.5)
nRBC: 0 % (ref 0.0–0.2)

## 2022-10-30 LAB — COMPREHENSIVE METABOLIC PANEL
ALT: 16 U/L (ref 0–44)
AST: 24 U/L (ref 15–41)
Albumin: 3 g/dL — ABNORMAL LOW (ref 3.5–5.0)
Alkaline Phosphatase: 79 U/L (ref 38–126)
Anion gap: 7 (ref 5–15)
BUN: 8 mg/dL (ref 8–23)
CO2: 23 mmol/L (ref 22–32)
Calcium: 8.6 mg/dL — ABNORMAL LOW (ref 8.9–10.3)
Chloride: 109 mmol/L (ref 98–111)
Creatinine, Ser: 0.61 mg/dL (ref 0.44–1.00)
GFR, Estimated: >60 mL/min (ref >60)
Glucose, Bld: 91 mg/dL (ref 70–99)
Potassium: 3.6 mmol/L (ref 3.5–5.1)
Sodium: 139 mmol/L (ref 135–145)
Total Bilirubin: 0.9 mg/dL (ref 0.3–1.2)
Total Protein: 6.4 g/dL — ABNORMAL LOW (ref 6.5–8.1)

## 2022-10-30 LAB — GLUCOSE, CAPILLARY
Glucose-Capillary: 110 mg/dL — ABNORMAL HIGH (ref 70–99)
Glucose-Capillary: 115 mg/dL — ABNORMAL HIGH (ref 70–99)
Glucose-Capillary: 118 mg/dL — ABNORMAL HIGH (ref 70–99)
Glucose-Capillary: 83 mg/dL (ref 70–99)
Glucose-Capillary: 94 mg/dL (ref 70–99)

## 2022-10-30 MED ORDER — ONDANSETRON HCL 4 MG PO TABS: 4.0000 mg | ORAL_TABLET | Freq: Four times a day (QID) | ORAL | 0 refills | Status: AC | PRN

## 2022-10-30 MED ORDER — ONDANSETRON HCL 4 MG PO TABS
4.0000 mg | ORAL_TABLET | Freq: Four times a day (QID) | ORAL | 0 refills | Status: DC | PRN
  Filled 2022-10-30: qty 20, 5d supply, fill #0

## 2022-10-30 MED ORDER — AMLODIPINE BESYLATE 10 MG PO TABS
5.0000 mg | ORAL_TABLET | Freq: Every day | ORAL | 1 refills | Status: DC
Start: 2022-10-30 — End: 2022-10-30
  Filled 2022-10-30: qty 90, 180d supply, fill #0

## 2022-10-30 MED ORDER — AMLODIPINE BESYLATE 10 MG PO TABS
5.0000 mg | ORAL_TABLET | Freq: Every day | ORAL | 1 refills | Status: DC
Start: 2022-10-30 — End: 2023-01-28

## 2022-10-30 NOTE — Discharge Summary (Signed)
Physician Discharge Summary  Catherine Snyder WUJ:[id number] DOB: 1940/07/01 DOA: 10/28/2022  PCP: Caesar Bookman, NP  Admit date: 10/28/2022 Discharge date: 10/30/2022  Admitted From: Home  Disposition: Home  Recommendations for Outpatient Follow-up:  Follow up with PCP in 1-2 weeks Please obtain BMP/CBC in one week PCP please follow-up on her blood pressure I had to cut down on her dose of Norvasc due to soft and normal blood pressure during the hospital stay  Home Health: None Equipment/Devices: None Discharge Condition: Stable CODE STATUS: Full code Diet recommendation: Cardiac  Brief/Interim Summary:81 y.o. female with medical history significant of hypertension, pulmonary hypertension in the context of bilateral saddle pulmonary embolus, CAD, chronic anticoagulation for history of PE, iron deficiency anemia and diabetes mellitus 2.  Patient had been in her usual state of health when she became nauseated after eating dried mango the night before.  She has not had any vomiting home but by the time she arrived to the [facility] she emesis.  Prior to coming in she did pass 1 small bowel movement but has not had any flatus since that.  She has not had any recent constitutional symptoms and she is currently not having any belly pain.  She has had no prior abdominal surgeries and has not had any bowel disease such as Crohn's, ulcerative colitis or diverticular disease.   In the ED She appeared dehydrated so she was given 1 L.  She was mildly hypokalemic with a potassium of 3.3, LFTs were normal, creatinine within normal limits, no leukocytosis and platelets were normal.  CT of the abdomen pelvis revealed mildly dilated fluid-filled loops of small bowel with a transition zone of the small bowel in the left hemipelvis with a developing or partial bowel obstruction.  There was a small amount of stranding but no pneumatosis or free the gallbladder was also.  Hospitalist service was  consulted for admission.   Discharge Diagnoses:  Principal Problem:   SBO (small bowel obstruction) (HCC)  Partial small bowel obstruction-patient was treated conservatively.  She was kept n.p.o. on IV fluids.  Her activity was increased.  Her bowel functions returned back to normal with the above treatments.  She was able to tolerate a regular diet prior to discharge.     Mild hypokalemia-resolved. Likely from GI losses   History of PE on Xarelto   Hypertension blood pressure soft, decrease Norvasc to 5 mg daily.  Lisinopril was stopped on discharge.  She will follow-up with her PCP.     Diabetes mellitus 2-diet controlled.   History of CAD/HLD 40% LAD at cath 02/04/16  On Lipitor 80 mg daily at home   History of iron deficiency anemia Hemoglobin stable with baseline between 11 and 12   Restless leg syndrome On Requip 0.5 mg at bedtime    Estimated body mass index is 26.99 kg/m as calculated from the following:   Height as of this encounter: 5\' 3"  (1.6 m).   Weight as of this encounter: 69.1 kg.  Discharge Instructions  Discharge Instructions     Diet - low sodium heart healthy   Complete by: As directed    Diet - low sodium heart healthy   Complete by: As directed    Increase activity slowly   Complete by: As directed    Increase activity slowly   Complete by: As directed       Allergies as of 10/30/2022   No Known Allergies      Medication List  STOP taking these medications    lisinopril 2.5 MG tablet Commonly known as: ZESTRIL       TAKE these medications    acetaminophen 500 MG tablet Commonly known as: TYLENOL Take 500 mg by mouth every 6 (six) hours as needed for moderate pain.   amLODipine 10 MG tablet Commonly known as: NORVASC Take 0.5 tablets (5 mg total) by mouth daily. What changed: how much to take   atorvastatin 80 MG tablet Commonly known as: LIPITOR Take 1 tablet (80 mg total) by mouth at bedtime.   ondansetron 4 MG  tablet Commonly known as: ZOFRAN Take 1 tablet (4 mg total) by mouth every 6 (six) hours as needed for nausea.   rOPINIRole 0.5 MG tablet Commonly known as: REQUIP TAKE 1 TABLET BY MOUTH EVERYDAY AT BEDTIME   vitamin C 1000 MG tablet Take 1,000 mg by mouth daily.   Xarelto 20 MG Tabs tablet Generic drug: rivaroxaban TAKE ONE TABLET BY MOUTH ONCE DAILY WITH SUPPER        Follow-up Information     Ngetich, Dinah C, NP Follow up.   Specialty: Family Medicine Contact information: [address] [place] [address] [phone]                No Known Allergies  Consultations: none   Procedures/Studies: DG Abd 1 View  Result Date: 10/29/2022 CLINICAL DATA:  Intractable nausea and vomiting. EXAM: ABDOMEN - 1 VIEW COMPARISON:  CT AP from 10/28/2022 FINDINGS: Bowel gas pattern appears nonobstructed. No pathologic dilatation of the large or small bowel loops. Gas and stool noted within the colon up to the level of the rectum. IMPRESSION: Bowel gas pattern appears nonobstructed on today's exam with gas and stool noted up to the rectum without plain film radiograph evidence of small-bowel dilatation. Electronically Signed   By: Signa Kell M.D.   On: 10/29/2022 10:57   CT ABDOMEN PELVIS W CONTRAST  Result Date: 10/28/2022 CLINICAL DATA:  Pain.  Vomiting EXAM: CT ABDOMEN AND PELVIS WITH CONTRAST TECHNIQUE: Multidetector CT imaging of the abdomen and pelvis was performed using the standard protocol following bolus administration of intravenous contrast. RADIATION DOSE REDUCTION: This exam was performed according to the departmental dose-optimization program which includes automated exposure control, adjustment of the mA and/or kV according to patient size and/or use of iterative reconstruction technique. CONTRAST:  75mL OMNIPAQUE IOHEXOL 300 MG/ML  SOLN COMPARISON:  CT 06/05/2015 FINDINGS: Lower chest: There is some linear opacity lung bases likely scar or atelectasis. No  pleural effusion. Heart is borderline in size. Coronary artery calcifications are seen. Small pericardial effusion. Small hiatal hernia. Hepatobiliary: Preserved hepatic parenchyma. Patent portal vein. There is a dilated gallbladder. If there is concern of gallbladder pathology, ultrasound may be useful as the next step in the workup when clinically appropriate. Pancreas: Unremarkable. No pancreatic ductal dilatation or surrounding inflammatory changes. Spleen: Normal in size without focal abnormality. Adrenals/Urinary Tract: Adrenal glands are preserved. No enhancing renal mass or collecting system dilatation. There is a duplicated right renal collecting system. The ureters have normal course and caliber extending down to the bladder Stomach/Bowel: Stomach is nondilated. Large bowel has a normal course and caliber with scattered diffuse colonic stool. There are a few redundant courses to the colon. Normal appendix. Small bowel is fluid-filled and distended with some mild areas of dilatation measuring up to 3 cm. There are some areas of small bowel stool appearance as well. There is a transition to collapsed small bowel in  the left hemipelvis as seen on coronal series 5, image 44, axial series 2, image 60. Please correlate for developing or partial obstruction. Mild mesenteric stranding. No pneumatosis. Vascular/Lymphatic: Aortic atherosclerosis. No enlarged abdominal or pelvic lymph nodes. Reproductive: Uterus and bilateral adnexa are unremarkable. Other: No free intra-air. Musculoskeletal: Moderate degenerative changes of the spine and pelvis. Transitional lumbosacral segment. Osteopenia. Multilevel stenosis along the lumbar spine with disc bulging and osteophytes. Particularly at L3-4 and L4-5. IMPRESSION: Mildly dilated fluid-filled loops of small bowel with a transition of small bowel in the left hemipelvis. A developing or partial obstruction is possible. There are some stranding. No pneumatosis or free air.  Distended gallbladder. If there is concern of acute gallbladder pathology ultrasound can be performed when clinically appropriate. Multilevel degenerative changes along the spine with the areas of stenosis in the lower lumbar spine. Please correlate with any symptoms. Electronically Signed   By: Karen Kays M.D.   On: 10/28/2022 11:10   (Echo, Carotid, EGD, Colonoscopy, ERCP)    Subjective: Doing better had a bowel movement tolerating diet anxious to go home  Discharge Exam: Vitals:   10/30/22 0432 10/30/22 1339  BP: 125/67 114/71  Pulse: (!) 52 (!) 55  Resp: 14 18  Temp: 97.9 F (36.6 C) 98 F (36.7 C)  SpO2: 98% 100%   Vitals:   10/29/22 0541 10/29/22 2009 10/30/22 0432 10/30/22 1339  BP: 117/70 133/66 125/67 114/71  Pulse: (!) 56 (!) 54 (!) 52 (!) 55  Resp: 18 14 14 18   Temp: 98.8 F (37.1 C) 98.4 F (36.9 C) 97.9 F (36.6 C) 98 F (36.7 C)  TempSrc: Oral Oral Oral   SpO2: 99% 99% 98% 100%  Weight:      Height:        General: Pt is alert, awake, not in acute distress Cardiovascular: RRR, S1/S2 +, no rubs, no gallops Respiratory: CTA bilaterally, no wheezing, no rhonchi Abdominal: Soft, NT, ND, bowel sounds + Extremities: no edema, no cyanosis    The results of significant diagnostics from this hospitalization (including imaging, microbiology, ancillary and laboratory) are listed below for reference.     Microbiology: No results found for this or any previous visit (from the past 240 hour(s)).   Labs: BNP (last 3 results) No results for input(s): "BNP" in the last 8760 hours. Basic Metabolic Panel: Recent Labs  Lab 10/28/22 0740 10/28/22 1517 10/29/22 0533 10/30/22 0712  NA 141  --  138 139  K 3.3*  --  3.6 3.6  CL 105  --  109 109  CO2 23  --  22 23  GLUCOSE 163*  --  97 91  BUN 9  --  9 8  CREATININE 0.73  --  0.74 0.61  CALCIUM 10.0  --  8.6* 8.6*  MG  --  2.2  --   --    Liver Function Tests: Recent Labs  Lab 10/28/22 0740 10/30/22 0712   AST 23 24  ALT 14 16  ALKPHOS 103 79  BILITOT 0.8 0.9  PROT 8.5* 6.4*  ALBUMIN 4.3 3.0*   Recent Labs  Lab 10/28/22 0740  LIPASE 12   No results for input(s): "AMMONIA" in the last 168 hours. CBC: Recent Labs  Lab 10/28/22 0740 10/29/22 0533 10/30/22 0712  WBC 6.1 5.1 3.9*  HGB 12.6 10.8* 11.2*  HCT 38.1 34.3* 35.2*  MCV 87.8 91.0 90.5  PLT 296 242 236   Cardiac Enzymes: No results for input(s): "CKTOTAL", "CKMB", "CKMBINDEX", "TROPONINI"  in the last 168 hours. BNP: Invalid input(s): "POCBNP" CBG: Recent Labs  Lab 10/29/22 2011 10/30/22 0000 10/30/22 0433 10/30/22 0753 10/30/22 1107  GLUCAP 75 110* 83 94 118*   D-Dimer No results for input(s): "DDIMER" in the last 72 hours. Hgb A1c Recent Labs    10/28/22 1517  HGBA1C 7.1*   Lipid Profile No results for input(s): "CHOL", "HDL", "LDLCALC", "TRIG", "CHOLHDL", "LDLDIRECT" in the last 72 hours. Thyroid function studies No results for input(s): "TSH", "T4TOTAL", "T3FREE", "THYROIDAB" in the last 72 hours.  Invalid input(s): "FREET3" Anemia work up No results for input(s): "VITAMINB12", "FOLATE", "FERRITIN", "TIBC", "IRON", "RETICCTPCT" in the last 72 hours. Urinalysis    Component Value Date/Time   COLORURINE YELLOW 10/28/2022 0851   APPEARANCEUR CLEAR 10/28/2022 0851   APPEARANCEUR Turbid (A) 05/07/2015 1624   LABSPEC 1.011 10/28/2022 0851   PHURINE 7.0 10/28/2022 0851   GLUCOSEU NEGATIVE 10/28/2022 0851   HGBUR NEGATIVE 10/28/2022 0851   BILIRUBINUR NEGATIVE 10/28/2022 0851   BILIRUBINUR Negative 05/07/2015 1624   KETONESUR NEGATIVE 10/28/2022 0851   PROTEINUR NEGATIVE 10/28/2022 0851   NITRITE NEGATIVE 10/28/2022 0851   LEUKOCYTESUR NEGATIVE 10/28/2022 0851   Sepsis Labs Recent Labs  Lab 10/28/22 0740 10/29/22 0533 10/30/22 0712  WBC 6.1 5.1 3.9*   Microbiology No results found for this or any previous visit (from the past 240 hour(s)).   Time coordinating discharge: 39  minutes  SIGNED:   Alwyn Ren, MD  [facility] 10/30/2022, 4:29 PM

## 2022-10-30 NOTE — Progress Notes (Signed)
Patient discharged to home with family, discharge instructions reviewed with patient and daughter who verbalized understanding.

## 2022-10-30 NOTE — TOC Transition Note (Signed)
Transition of Care [facility]) - CM/SW Discharge Note   Patient Details  Name: Catherine Snyder MRN: [id number] Date of Birth: [birth year]  [facility] CM/SW Contact:  Lanier Clam, RN Phone Number: 10/30/2022, 2:52 PM   Clinical Narrative: d/c home no needs or orders.      Final next level of care: Home/Self Care Barriers to Discharge: No Barriers Identified   Patient Goals and CMS Choice      Discharge Placement                         Discharge Plan and Services Additional resources added to the After Visit Summary for                                       Social Determinants of Health (SDOH) Interventions SDOH Screenings   Food Insecurity: No Food Insecurity (10/28/2022)  Housing: Low Risk  (10/28/2022)  Transportation Needs: No Transportation Needs (10/28/2022)  Utilities: Not At Risk (10/28/2022)  Depression (PHQ2-9): Low Risk  (09/20/2022)  Financial Resource Strain: Low Risk  (06/13/2017)  Physical Activity: Insufficiently Active (06/13/2017)  Social Connections: Somewhat Isolated (06/13/2017)  Stress: No Stress Concern Present (06/13/2017)  Tobacco Use: Low Risk  (10/28/2022)     Readmission Risk Interventions     No data to display

## 2022-10-30 NOTE — Evaluation (Signed)
Physical Therapy One Time Evaluation Patient Details Name: Catherine Snyder MRN: [id number] DOB: May 15, 1940 Today's Date: 10/30/2022  History of Present Illness  82 y.o. female admitted 10/28/22 with Partial small bowel obstruction.  Past medical history significant of hypertension, pulmonary hypertension in the context of bilateral saddle pulmonary embolus, CAD, chronic anticoagulation for history of PE, iron deficiency anemia, and diabetes mellitus 2  Clinical Impression  Patient evaluated by Physical Therapy with no further acute PT needs identified. All education has been completed and the patient has no further questions.  Pt ambulated in hallway and feels at baseline with her mobility.  Pt lives with her daughter and grandchildren.  No follow-up Physical Therapy or equipment needs. PT is signing off. Thank you for this referral.         If plan is discharge home, recommend the following:     Can travel by private vehicle        Equipment Recommendations None recommended by PT  Recommendations for Other Services       Functional Status Assessment Patient has not had a recent decline in their functional status     Precautions / Restrictions Precautions Precautions: Fall      Mobility  Bed Mobility Overal bed mobility: Modified Independent                  Transfers Overall transfer level: Modified independent                      Ambulation/Gait Ambulation/Gait assistance: Supervision, Contact guard assist Gait Distance (Feet): 350 Feet Assistive device: Rolling walker (2 wheels) Gait Pattern/deviations: Step-through pattern, Decreased stride length Gait velocity: decr     General Gait Details: pt reports ambulation at baseline, no overt LOB or unsteadiness observed  Stairs            Wheelchair Mobility     Tilt Bed    Modified Rankin (Stroke Patients Only)       Balance Overall balance assessment: No apparent balance deficits  (not formally assessed) (denies any falls)                                           Pertinent Vitals/Pain Pain Assessment Pain Assessment: No/denies pain    Home Living Family/patient expects to be discharged to:: Private residence Living Arrangements: Children (daughter and grandchildren) Available Help at Discharge: Family;Available 24 hours/day Type of Home: House Home Access: Stairs to enter Entrance Stairs-Rails: Right Entrance Stairs-Number of Steps: 3-4   Home Layout: One level Home Equipment: None      Prior Function Prior Level of Function : Independent/Modified Independent               ADLs Comments: family does IADLs, no longer drives     Extremity/Trunk Assessment   Upper Extremity Assessment Upper Extremity Assessment: Overall WFL for tasks assessed    Lower Extremity Assessment Lower Extremity Assessment: Overall WFL for tasks assessed       Communication      Cognition Arousal: Alert Behavior During Therapy: WFL for tasks assessed/performed Overall Cognitive Status: Within Functional Limits for tasks assessed  General Comments      Exercises     Assessment/Plan    PT Assessment Patient does not need any further PT services  PT Problem List         PT Treatment Interventions      PT Goals (Current goals can be found in the Care Plan section)  Acute Rehab PT Goals PT Goal Formulation: All assessment and education complete, DC therapy    Frequency       Co-evaluation               AM-PAC PT "6 Clicks" Mobility  Outcome Measure Help needed turning from your back to your side while in a flat bed without using bedrails?: None Help needed moving from lying on your back to sitting on the side of a flat bed without using bedrails?: None Help needed moving to and from a bed to a chair (including a wheelchair)?: None Help needed standing up from a  chair using your arms (e.g., wheelchair or bedside chair)?: None Help needed to walk in hospital room?: None Help needed climbing 3-5 steps with a railing? : A Little 6 Click Score: 23    End of Session Equipment Utilized During Treatment: Gait belt Activity Tolerance: Patient tolerated treatment well Patient left: in chair;with call bell/phone within reach;with chair alarm set Nurse Communication: Mobility status PT Visit Diagnosis: Difficulty in walking, not elsewhere classified (R26.2)    Time: 4098-1191 PT Time Calculation (min) (ACUTE ONLY): 11 min   Charges:   PT Evaluation $PT Eval Low Complexity: 1 Low   PT General Charges $$ ACUTE PT VISIT: 1 Visit        Paulino Door, DPT Physical Therapist Acute Rehabilitation Services Office: [phone]   Janan Halter [place] 10/30/2022, 12:41 PM

## 2022-11-01 ENCOUNTER — Telehealth: Payer: Self-pay

## 2022-11-01 NOTE — Transitions of Care (Post Inpatient/ED Visit) (Signed)
   11/01/2022  Name: BESSE DELLA MRN: [id number] DOB: 01/08/1941  Today's TOC FU Call Status: Today's TOC FU Call Status:: Unsuccessful Call (1st Attempt) Unsuccessful Call (1st Attempt) Date: 11/01/22  Attempted to reach the patient regarding the most recent Inpatient/ED visit.  Follow Up Plan: Additional outreach attempts will be made to reach the patient to complete the Transitions of Care (Post Inpatient/ED visit) call.      Antionette Fairy, RN,BSN,CCM [facility] Care Management Care Management Community Coordinator Direct Phone: [phone] Toll Free: [phone] Fax: [phone]

## 2022-11-02 ENCOUNTER — Telehealth: Payer: Self-pay

## 2022-11-02 ENCOUNTER — Other Ambulatory Visit (HOSPITAL_BASED_OUTPATIENT_CLINIC_OR_DEPARTMENT_OTHER): Payer: Self-pay

## 2022-11-02 NOTE — Transitions of Care (Post Inpatient/ED Visit) (Signed)
11/02/2022  Name: Catherine Snyder MRN: [id number] DOB: 08-31-1940  Today's TOC FU Call Status: Today's TOC FU Call Status:: Successful TOC FU Call Completed TOC FU Call Complete Date: 11/02/22  Transition Care Management Follow-up Telephone Call Date of Discharge: 10/30/22 Discharge Facility: [facility]) Type of Discharge: Inpatient Admission Primary Inpatient Discharge Diagnosis:: "upper abd pain" How have you been since you were released from the hospital?: Better (Pt states she is still in bed "resting but doing good"-no further abd pain or GI issues. Appetite is "pretty good.' She had a BM since returning home. No N&V.) Any questions or concerns?: No  Items Reviewed: Did you receive and understand the discharge instructions provided?: Yes Medications obtained,verified, and reconciled?: Yes (Medications Reviewed) Any new allergies since your discharge?: No Dietary orders reviewed?: Yes Type of Diet Ordered:: low salt/heart healthy Do you have support at home?: Yes People in Home: child(ren), adult Name of Support/Comfort Primary Source: Para March  Medications Reviewed Today: Medications Reviewed Today     Reviewed by Charlyn Minerva, RN (Registered Nurse) on 11/02/22 at [phone]  Med List Status: <None>   Medication Order Taking? Sig Documenting Provider Last Dose Status Informant  acetaminophen (TYLENOL) 500 MG tablet [id number] Yes Take 500 mg by mouth every 6 (six) hours as needed for moderate pain. [provider] Taking Active Self, Multiple Informants           Med Note Merlene Morse, [facility]   Thu Oct 28, 2022 12:11 PM) prn  amLODipine (NORVASC) 10 MG tablet [id number] Yes Take 0.5 tablets (5 mg total) by mouth daily. Alwyn Ren, MD Taking Active   Ascorbic Acid (VITAMIN C) 1000 MG tablet [id number] Yes Take 1,000 mg by mouth daily. [provider] Taking Active Self, Multiple Informants  atorvastatin (LIPITOR) 80 MG tablet  [id number] Yes Take 1 tablet (80 mg total) by mouth at bedtime. Ngetich, Donalee Citrin, NP Taking Active Self, Multiple Informants  ondansetron (ZOFRAN) 4 MG tablet [id number]  Take 1 tablet (4 mg total) by mouth every 6 (six) hours as needed for nausea. Alwyn Ren, MD  Active   rivaroxaban Carlena Hurl) 20 MG TABS tablet [id number] Yes TAKE ONE TABLET BY MOUTH ONCE DAILY WITH SUPPER Ngetich, Dinah C, NP Taking Active Self, Multiple Informants  rOPINIRole (REQUIP) 0.5 MG tablet [id number] Yes TAKE 1 TABLET BY MOUTH EVERYDAY AT BEDTIME Ngetich, Dinah C, NP Taking Active Self, Multiple Informants            Home Care and Equipment/Supplies: Were Home Health Services Ordered?: NA Any new equipment or medical supplies ordered?: NA  Functional Questionnaire: Do you need assistance with bathing/showering or dressing?: No Do you need assistance with meal preparation?: No Do you need assistance with eating?: No Do you have difficulty maintaining continence: No Do you need assistance with getting out of bed/getting out of a chair/moving?: No Do you have difficulty managing or taking your medications?: No  Follow up appointments reviewed: PCP Follow-up appointment confirmed?: Yes Date of PCP follow-up appointment?: 11/04/22 Follow-up Provider: [facility] Follow-up appointment confirmed?: NA Do you need transportation to your follow-up appointment?: No (pt confirms daughter takes her to appts) Do you understand care options if your condition(s) worsen?: Yes-patient verbalized understanding  SDOH Interventions Today    Flowsheet Row Most Recent Value  SDOH Interventions   Food Insecurity Interventions Intervention Not Indicated  Transportation Interventions Intervention Not Indicated      TOC Interventions Today    Flowsheet Row  Most Recent Value  TOC Interventions   TOC Interventions Discussed/Reviewed TOC Interventions Discussed, Arranged PCP follow up within 7  days/Care Guide scheduled       Interventions Today    Flowsheet Row Most Recent Value  Chronic Disease   Chronic disease during today's visit Hypertension (HTN)  General Interventions   General Interventions Discussed/Reviewed General Interventions Discussed, Doctor Visits, Durable Medical Equipment (DME)  Doctor Visits Discussed/Reviewed Doctor Visits Discussed, PCP  Durable Medical Equipment (DME) BP Cuff  PCP/Specialist Visits Compliance with follow-up visit  Education Interventions   Education Provided Provided Education  Provided Verbal Education On Other, Nutrition, Medication, When to see the doctor  Nutrition Interventions   Nutrition Discussed/Reviewed Nutrition Discussed, Adding fruits and vegetables, Increasing proteins, Decreasing salt, Decreasing fats, Fluid intake  Pharmacy Interventions   Pharmacy Dicussed/Reviewed Pharmacy Topics Discussed, Medications and their functions       Alessandra Grout [facility] Care Management Care Management Community Coordinator Direct Phone: [phone] Toll Free: [phone] Fax: [phone]

## 2022-11-04 ENCOUNTER — Encounter: Payer: Self-pay | Admitting: Family

## 2022-11-04 ENCOUNTER — Ambulatory Visit (INDEPENDENT_AMBULATORY_CARE_PROVIDER_SITE_OTHER): Payer: PPO | Admitting: Family

## 2022-11-04 VITALS — BP 120/72 | HR 68 | Temp 97.4°F | Resp 16 | Ht 63.0 in | Wt 148.2 lb

## 2022-11-04 DIAGNOSIS — G8929 Other chronic pain: Secondary | ICD-10-CM | POA: Diagnosis not present

## 2022-11-04 DIAGNOSIS — M25551 Pain in right hip: Secondary | ICD-10-CM

## 2022-11-04 DIAGNOSIS — I10 Essential (primary) hypertension: Secondary | ICD-10-CM | POA: Diagnosis not present

## 2022-11-04 NOTE — Progress Notes (Signed)
Provider: Richarda Blade FNP-C  Aasiya Creasey, Donalee Citrin, NP  Patient Care Team: Rishik Tubby, Donalee Citrin, NP as PCP - General (Family Medicine)  Extended Emergency Contact Information Primary Emergency Contact: Roudebush,Jeanette Address: [address]           [place], [place] [address] [place] Home Phone: [phone] Mobile Phone: [phone] Relation: Daughter Secondary Emergency Contact: Vella Raring [place] Home Phone: [phone] Mobile Phone: [phone] Relation: Son  Code Status:  full Code  Goals of care: Advanced Directive information    11/04/2022    1:32 PM  Advanced Directives  Does Patient Have a Medical Advance Directive? No  Type of Estate agent of [place];Living will;Out of facility DNR (pink MOST or yellow form)  Does patient want to make changes to medical advance directive? No - Patient declined  Copy of Healthcare Power of Attorney in Chart? Yes - validated most recent copy scanned in chart (See row information)     Chief Complaint  Patient presents with  . Transitions Of Care    Patient is being seen for [facility] / Hospital follow up  . Immunizations    Patient is due for covid, tdap and shingles vaccine   . Health Maintenance    Patient is due for eye exam     HPI:  Pt is a 82 y.o. female seen today for an acute visit for transition of care post hospitalization from 10/28/2022 - 10/30/2022 after she presented with nausea after eating dried mangoand apricot the night before.No vomiting reported.she was noted to dehydrated was given 1 L of IV fluid.labs were unremarkable except K+ was low 3.3 CT of abd pelvis showed mildly dilated fluid -filled loops of small bowel with a transition zone of the small bowel in the left hemipelvis with a developing or partial bowel obstruction.   States symptoms have resolved.bowel movements have been moving well.Appetite is good. Her blood pressure was soft in the hospital  Norvasc was decreased from 10 mg to 5 mg tablet daily and lisinopril discontinued and advised to follow up with PCP to recheck B/p. B/p is within normal range.    Past Medical History:  Diagnosis Date  . Absolute anemia   . CAD in native artery    40% LAD at cath 02/04/16  . Chronic anticoagulation 03/02/2016  . Demand ischemia of myocardium - Not NSTEMI 02/03/2016  . Dyslipidemia 03/02/2016  . Dyspnea on exertion   . Elevated troponin   . Essential hypertension 11/27/2014  . Frequency of urination   . Hematuria   . History of bladder cancer    a. s/p resection in 06/2015  . MCI (mild cognitive impairment) 06/30/2020  . Nocturia   . Primary osteoarthritis of first carpometacarpal joint of left hand 04/22/2017  . Pulmonary hypertension   . Pulmonary hypertension due to thromboembolism 02/05/2016  . RLS (restless legs syndrome) 05/21/2016  . Saddle embolus of pulmonary artery without acute cor pulmonale    Acute saddle PE treated with direct thrombolytics 02/07/16  . Wears glasses   . Wears partial dentures    upper   Past Surgical History:  Procedure Laterality Date  . CARDIAC CATHETERIZATION N/A 02/04/2016   Procedure: RIGHT/LEFT HEART CATH AND CORONARY ANGIOGRAPHY;  Surgeon: Lennette Bihari, MD;  Location: [facility];  Service: Cardiovascular;  Laterality: N/A;  . CYSTOSCOPY W/ RETROGRADES Bilateral 07/07/2015   Procedure: CYSTOSCOPY WITH RETROGRADE PYELOGRAM;  Surgeon: Malen Gauze, MD;  Location: [facility];  Service: Urology;  Laterality: Bilateral;  . IR GENERIC HISTORICAL  02/07/2016   IR ANGIOGRAM SELECTIVE EACH ADDITIONAL VESSEL 02/07/2016 Malachy Moan, MD [facility]  . IR GENERIC HISTORICAL  02/07/2016   IR US GUIDE VASC ACCESS RIGHT 02/07/2016 Malachy Moan, MD [facility]  . IR GENERIC HISTORICAL  02/07/2016   IR ANGIOGRAM PULMONARY BILATERAL SELECTIVE 02/07/2016 Malachy Moan, MD [facility]  . IR GENERIC HISTORICAL   02/07/2016   IR INFUSION THROMBOL ARTERIAL INITIAL (MS) 02/07/2016 Malachy Moan, MD [facility]  . IR GENERIC HISTORICAL  02/07/2016   IR INFUSION THROMBOL ARTERIAL INITIAL (MS) 02/07/2016 Malachy Moan, MD [facility]  . IR GENERIC HISTORICAL  02/07/2016   IR ANGIOGRAM SELECTIVE EACH ADDITIONAL VESSEL 02/07/2016 Malachy Moan, MD [facility]  . NO PAST SURGERIES    . TRANSURETHRAL RESECTION OF BLADDER TUMOR WITH GYRUS (TURBT-GYRUS) N/A 07/07/2015   Procedure: TRANSURETHRAL RESECTION OF BLADDER TUMOR WITH GYRUS (TURBT-GYRUS);  Surgeon: Malen Gauze, MD;  Location: [facility];  Service: Urology;  Laterality: N/A;    No Known Allergies  Outpatient Encounter Medications as of 11/04/2022  Medication Sig  . acetaminophen (TYLENOL) 500 MG tablet Take 500 mg by mouth every 6 (six) hours as needed for moderate pain.  Marland Kitchen amLODipine (NORVASC) 10 MG tablet Take 0.5 tablets (5 mg total) by mouth daily.  . Ascorbic Acid (VITAMIN C) 1000 MG tablet Take 1,000 mg by mouth daily.  Marland Kitchen atorvastatin (LIPITOR) 80 MG tablet Take 1 tablet (80 mg total) by mouth at bedtime.  . ondansetron (ZOFRAN) 4 MG tablet Take 1 tablet (4 mg total) by mouth every 6 (six) hours as needed for nausea.  . rivaroxaban (XARELTO) 20 MG TABS tablet TAKE ONE TABLET BY MOUTH ONCE DAILY WITH SUPPER  . rOPINIRole (REQUIP) 0.5 MG tablet TAKE 1 TABLET BY MOUTH EVERYDAY AT BEDTIME   No facility-administered encounter medications on file as of 11/04/2022.    Review of Systems  Constitutional:  Negative for appetite change, chills, fatigue, fever and unexpected weight change.  HENT:  Negative for congestion, dental problem, ear discharge, ear pain, facial swelling, hearing loss, nosebleeds, postnasal drip, rhinorrhea, sinus pressure, sinus pain, sneezing, sore throat, tinnitus and trouble swallowing.   Eyes:  Negative for pain, discharge, redness, itching and visual disturbance.  Respiratory:  Negative for  cough, chest tightness, shortness of breath and wheezing.   Cardiovascular:  Negative for chest pain, palpitations and leg swelling.  Gastrointestinal:  Negative for abdominal distention, abdominal pain, blood in stool, constipation, diarrhea, nausea and vomiting.  Endocrine: Negative for cold intolerance, heat intolerance, polydipsia, polyphagia and polyuria.  Genitourinary:  Negative for difficulty urinating, dysuria, flank pain, frequency and urgency.  Musculoskeletal:  Negative for arthralgias, back pain, gait problem, joint swelling, myalgias, neck pain and neck stiffness.  Skin:  Negative for color change, pallor, rash and wound.  Neurological:  Negative for dizziness, syncope, speech difficulty, weakness, light-headedness, numbness and headaches.  Hematological:  Does not bruise/bleed easily.  Psychiatric/Behavioral:  Negative for agitation, behavioral problems, confusion, hallucinations, self-injury, sleep disturbance and suicidal ideas. The patient is not nervous/anxious.     Immunization History  Administered Date(s) Administered  . Influenza,inj,Quad PF,6+ Mos 11/27/2014  . PNEUMOCOCCAL CONJUGATE-20 09/10/2021  . Pneumococcal Conjugate-13 06/13/2017   Pertinent  Health Maintenance Due  Topic Date Due  . OPHTHALMOLOGY EXAM  Never done  . INFLUENZA VACCINE  10/14/2022  . HEMOGLOBIN A1C  04/30/2023  . FOOT EXAM  06/15/2023  .  MAMMOGRAM  08/12/2023  . DEXA SCAN  Completed      03/07/2022   11:23 AM 03/22/2022    6:44 PM 06/15/2022   12:51 PM 09/20/2022   10:20 AM 11/04/2022    1:32 PM  Fall Risk  Falls in the past year?   0 0 0  Was there an injury with Fall?   0 0 0  Fall Risk Category Calculator   0 0 0  (RETIRED) Patient Fall Risk Level Low fall risk Low fall risk     Patient at Risk for Falls Due to   History of fall(s) No Fall Risks No Fall Risks  Fall risk Follow up   Falls evaluation completed Falls evaluation completed Falls evaluation completed   Functional Status  Survey:    Vitals:   11/04/22 1329  BP: 120/72  Pulse: 68  Resp: 16  Temp: (!) 97.4 F (36.3 C)  TempSrc: Temporal  SpO2: 98%  Weight: 148 lb 3.2 oz (67.2 kg)  Height: 5\' 3"  (1.6 m)   Body mass index is 26.25 kg/m. Physical Exam Vitals reviewed.  Constitutional:      General: She is not in acute distress.    Appearance: Normal appearance. She is overweight. She is not ill-appearing or diaphoretic.  HENT:     Head: Normocephalic.     Right Ear: Tympanic membrane, ear canal and external ear normal. There is no impacted cerumen.     Left Ear: Tympanic membrane, ear canal and external ear normal. There is no impacted cerumen.     Nose: Nose normal. No congestion or rhinorrhea.     Mouth/Throat:     Mouth: Mucous membranes are moist.     Pharynx: Oropharynx is clear. No oropharyngeal exudate or posterior oropharyngeal erythema.  Eyes:     General: No scleral icterus.       Right eye: No discharge.        Left eye: No discharge.     Extraocular Movements: Extraocular movements intact.     Conjunctiva/sclera: Conjunctivae normal.     Pupils: Pupils are equal, round, and reactive to light.  Neck:     Vascular: No carotid bruit.  Cardiovascular:     Rate and Rhythm: Normal rate and regular rhythm.     Pulses: Normal pulses.     Heart sounds: Normal heart sounds. No murmur heard.    No friction rub. No gallop.  Pulmonary:     Effort: Pulmonary effort is normal. No respiratory distress.     Breath sounds: Normal breath sounds. No wheezing, rhonchi or rales.  Chest:     Chest wall: No tenderness.  Abdominal:     General: Bowel sounds are normal. There is no distension.     Palpations: Abdomen is soft. There is no mass.     Tenderness: There is no abdominal tenderness. There is no right CVA tenderness, left CVA tenderness, guarding or rebound.  Musculoskeletal:        General: No swelling or tenderness. Normal range of motion.     Cervical back: Normal range of motion. No  rigidity or tenderness.     Right lower leg: No edema.     Left lower leg: No edema.  Lymphadenopathy:     Cervical: No cervical adenopathy.  Skin:    General: Skin is warm and dry.     Coloration: Skin is not pale.     Findings: No bruising, erythema, lesion or rash.  Neurological:  Mental Status: She is alert and oriented to person, place, and time.     Cranial Nerves: No cranial nerve deficit.     Sensory: No sensory deficit.     Motor: No weakness.     Coordination: Coordination normal.     Gait: Gait normal.  Psychiatric:        Mood and Affect: Mood normal.        Speech: Speech normal.        Behavior: Behavior normal.        Thought Content: Thought content normal.        Judgment: Judgment normal.   Labs reviewed: Recent Labs    10/28/22 0740 10/28/22 1517 10/29/22 0533 10/30/22 0712  NA 141  --  138 139  K 3.3*  --  3.6 3.6  CL 105  --  109 109  CO2 23  --  22 23  GLUCOSE 163*  --  97 91  BUN 9  --  9 8  CREATININE 0.73  --  0.74 0.61  CALCIUM 10.0  --  8.6* 8.6*  MG  --  2.2  --   --    Recent Labs    06/15/22 1341 10/28/22 0740 10/30/22 0712  AST 23 23 24   ALT 17 14 16   ALKPHOS  --  103 79  BILITOT 0.6 0.8 0.9  PROT 7.7 8.5* 6.4*  ALBUMIN  --  4.3 3.0*   Recent Labs    06/15/22 1341 10/28/22 0740 10/29/22 0533 10/30/22 0712  WBC 3.6* 6.1 5.1 3.9*  NEUTROABS 1,444*  --   --   --   HGB 11.6* 12.6 10.8* 11.2*  HCT 35.8 38.1 34.3* 35.2*  MCV 88.0 87.8 91.0 90.5  PLT 242 296 242 236   Lab Results  Component Value Date   TSH 0.80 06/15/2022   Lab Results  Component Value Date   HGBA1C 7.1 (H) 10/28/2022   Lab Results  Component Value Date   CHOL 117 06/15/2022   HDL 55 06/15/2022   LDLCALC 48 06/15/2022   TRIG 62 06/15/2022   CHOLHDL 2.1 06/15/2022    Significant Diagnostic Results in last 30 days:  DG Abd 1 View  Result Date: 10/29/2022 CLINICAL DATA:  Intractable nausea and vomiting. EXAM: ABDOMEN - 1 VIEW COMPARISON:  CT  AP from 10/28/2022 FINDINGS: Bowel gas pattern appears nonobstructed. No pathologic dilatation of the large or small bowel loops. Gas and stool noted within the colon up to the level of the rectum. IMPRESSION: Bowel gas pattern appears nonobstructed on today's exam with gas and stool noted up to the rectum without plain film radiograph evidence of small-bowel dilatation. Electronically Signed   By: Signa Kell M.D.   On: 10/29/2022 10:57   CT ABDOMEN PELVIS W CONTRAST  Result Date: 10/28/2022 CLINICAL DATA:  Pain.  Vomiting EXAM: CT ABDOMEN AND PELVIS WITH CONTRAST TECHNIQUE: Multidetector CT imaging of the abdomen and pelvis was performed using the standard protocol following bolus administration of intravenous contrast. RADIATION DOSE REDUCTION: This exam was performed according to the departmental dose-optimization program which includes automated exposure control, adjustment of the mA and/or kV according to patient size and/or use of iterative reconstruction technique. CONTRAST:  75mL OMNIPAQUE IOHEXOL 300 MG/ML  SOLN COMPARISON:  CT 06/05/2015 FINDINGS: Lower chest: There is some linear opacity lung bases likely scar or atelectasis. No pleural effusion. Heart is borderline in size. Coronary artery calcifications are seen. Small pericardial effusion. Small hiatal hernia. Hepatobiliary: Preserved hepatic  parenchyma. Patent portal vein. There is a dilated gallbladder. If there is concern of gallbladder pathology, ultrasound may be useful as the next step in the workup when clinically appropriate. Pancreas: Unremarkable. No pancreatic ductal dilatation or surrounding inflammatory changes. Spleen: Normal in size without focal abnormality. Adrenals/Urinary Tract: Adrenal glands are preserved. No enhancing renal mass or collecting system dilatation. There is a duplicated right renal collecting system. The ureters have normal course and caliber extending down to the bladder Stomach/Bowel: Stomach is nondilated.  Large bowel has a normal course and caliber with scattered diffuse colonic stool. There are a few redundant courses to the colon. Normal appendix. Small bowel is fluid-filled and distended with some mild areas of dilatation measuring up to 3 cm. There are some areas of small bowel stool appearance as well. There is a transition to collapsed small bowel in the left hemipelvis as seen on coronal series 5, image 44, axial series 2, image 60. Please correlate for developing or partial obstruction. Mild mesenteric stranding. No pneumatosis. Vascular/Lymphatic: Aortic atherosclerosis. No enlarged abdominal or pelvic lymph nodes. Reproductive: Uterus and bilateral adnexa are unremarkable. Other: No free intra-air. Musculoskeletal: Moderate degenerative changes of the spine and pelvis. Transitional lumbosacral segment. Osteopenia. Multilevel stenosis along the lumbar spine with disc bulging and osteophytes. Particularly at L3-4 and L4-5. IMPRESSION: Mildly dilated fluid-filled loops of small bowel with a transition of small bowel in the left hemipelvis. A developing or partial obstruction is possible. There are some stranding. No pneumatosis or free air. Distended gallbladder. If there is concern of acute gallbladder pathology ultrasound can be performed when clinically appropriate. Multilevel degenerative changes along the spine with the areas of stenosis in the lower lumbar spine. Please correlate with any symptoms. Electronically Signed   By: Karen Kays M.D.   On: 10/28/2022 11:10    Assessment/Plan  1. Essential hypertension B/p well controlled.Norvasc was decreased to 5 mg and lisinopril was discontinued. - Advised to continue to monitor B/p and notify if > 140/90  - CBC with Differential/Platelet - Basic metabolic panel  2. Chronic right hip pain Right trochanter tender to palpation. - OTC tylenol 500 mg tablet twice daily as needed for pain  - will obtain right hip X-ray  - Please get right hip X-ray  at [facility] imaging at [address] then will call you with results. - DG HIP UNILAT W OR W/O PELVIS 2-3 VIEWS RIGHT; Future  Family/ staff Communication: Reviewed plan of care with patient verbalized understanding   Labs/tests ordered:  - DG HIP UNILAT W OR W/O PELVIS 2-3 VIEWS RIGHT; Future - CBC with Differential/Platelet - Basic metabolic panel  Next Appointment: Return in about 7 weeks (around 12/20/2022) for medical mangement of chronic issues.Caesar Bookman, NP

## 2022-11-04 NOTE — Patient Instructions (Signed)
-   Please get right hip X-ray at [facility] at [address] then will call you with results.  

## 2022-11-05 LAB — BASIC METABOLIC PANEL
BUN: 8 mg/dL (ref 7–25)
CO2: 29 mmol/L (ref 20–32)
Calcium: 9.7 mg/dL (ref 8.6–10.4)
Chloride: 106 mmol/L (ref 98–110)
Creat: 0.68 mg/dL (ref 0.60–0.95)
Glucose, Bld: 111 mg/dL — ABNORMAL HIGH (ref 65–99)
Potassium: 3.9 mmol/L (ref 3.5–5.3)
Sodium: 141 mmol/L (ref 135–146)

## 2022-11-05 LAB — CBC WITH DIFFERENTIAL/PLATELET
Absolute Monocytes: 369 {cells}/uL (ref 200–950)
Basophils Absolute: 21 {cells}/uL (ref 0–200)
Basophils Relative: 0.7 %
Eosinophils Absolute: 39 cells/uL (ref 15–500)
Eosinophils Relative: 1.3 %
HCT: 36.9 % (ref 35.0–45.0)
Hemoglobin: 12 g/dL (ref 11.7–15.5)
Lymphs Abs: 1203 {cells}/uL (ref 850–3900)
MCH: 28.8 pg (ref 27.0–33.0)
MCHC: 32.5 g/dL (ref 32.0–36.0)
MCV: 88.5 fL (ref 80.0–100.0)
MPV: 10.3 fL (ref 7.5–12.5)
Monocytes Relative: 12.3 %
Neutro Abs: 1368 cells/uL — ABNORMAL LOW (ref 1500–7800)
Neutrophils Relative %: 45.6 %
Platelets: 266 10*3/uL (ref 140–400)
RBC: 4.17 10*6/uL (ref 3.80–5.10)
RDW: 14 % (ref 11.0–15.0)
Total Lymphocyte: 40.1 %
WBC: 3 10*3/uL — ABNORMAL LOW (ref 3.8–10.8)

## 2022-11-08 ENCOUNTER — Other Ambulatory Visit: Payer: Self-pay

## 2022-11-27 ENCOUNTER — Other Ambulatory Visit: Payer: Self-pay | Admitting: Student

## 2022-11-27 ENCOUNTER — Encounter: Payer: Self-pay | Admitting: Student

## 2022-11-27 ENCOUNTER — Other Ambulatory Visit: Payer: Self-pay | Admitting: Family

## 2022-11-27 DIAGNOSIS — Z7901 Long term (current) use of anticoagulants: Secondary | ICD-10-CM

## 2022-11-27 MED ORDER — RIVAROXABAN 20 MG PO TABS
20.0000 mg | ORAL_TABLET | Freq: Every day | ORAL | 0 refills | Status: DC
Start: 2022-11-27 — End: 2023-03-07

## 2022-11-27 NOTE — Progress Notes (Signed)
Patient called on call requesting refill. Sent for 90 days, f/u with pcp for future refills.

## 2022-12-11 ENCOUNTER — Other Ambulatory Visit: Payer: Self-pay | Admitting: Family

## 2022-12-11 DIAGNOSIS — E119 Type 2 diabetes mellitus without complications: Secondary | ICD-10-CM

## 2022-12-13 NOTE — Telephone Encounter (Signed)
Requested medicaiton is not on active medication list

## 2022-12-20 ENCOUNTER — Encounter: Payer: PPO | Admitting: Family

## 2022-12-20 NOTE — Progress Notes (Signed)
  This encounter was created in error - please disregard. No show 

## 2022-12-28 ENCOUNTER — Other Ambulatory Visit: Payer: Self-pay | Admitting: Family

## 2022-12-28 DIAGNOSIS — E119 Type 2 diabetes mellitus without complications: Secondary | ICD-10-CM

## 2022-12-28 DIAGNOSIS — I10 Essential (primary) hypertension: Secondary | ICD-10-CM

## 2023-01-27 ENCOUNTER — Other Ambulatory Visit: Payer: Self-pay | Admitting: Family

## 2023-01-27 DIAGNOSIS — E119 Type 2 diabetes mellitus without complications: Secondary | ICD-10-CM

## 2023-01-27 DIAGNOSIS — I10 Essential (primary) hypertension: Secondary | ICD-10-CM

## 2023-01-28 NOTE — Telephone Encounter (Signed)
One of patient medications was discontinued.

## 2023-03-05 ENCOUNTER — Other Ambulatory Visit: Payer: Self-pay | Admitting: Student

## 2023-03-05 DIAGNOSIS — Z7901 Long term (current) use of anticoagulants: Secondary | ICD-10-CM

## 2023-04-10 ENCOUNTER — Other Ambulatory Visit: Payer: Self-pay | Admitting: Family

## 2023-04-10 DIAGNOSIS — I2699 Other pulmonary embolism without acute cor pulmonale: Secondary | ICD-10-CM

## 2023-05-22 ENCOUNTER — Other Ambulatory Visit: Payer: Self-pay | Admitting: Family

## 2023-05-22 DIAGNOSIS — G2581 Restless legs syndrome: Secondary | ICD-10-CM

## 2023-06-30 ENCOUNTER — Other Ambulatory Visit: Payer: Self-pay | Admitting: Family

## 2023-06-30 DIAGNOSIS — E119 Type 2 diabetes mellitus without complications: Secondary | ICD-10-CM

## 2023-06-30 DIAGNOSIS — I10 Essential (primary) hypertension: Secondary | ICD-10-CM

## 2023-09-22 ENCOUNTER — Encounter: Payer: PPO | Admitting: Adult Health

## 2024-02-14 ENCOUNTER — Encounter: Payer: Self-pay | Admitting: *Deleted

## 2024-02-14 NOTE — Progress Notes (Signed)
 Catherine Snyder                                          MRN: [id number]   02/14/2024   The [facility] Quality Team Specialist reviewed this patient medical record for the purposes of chart review for care gap closure. The following were reviewed: chart review for care gap closure-kidney health evaluation for diabetes:eGFR  and uACR.    [facility]BCI Quality Team

## 2024-03-13 ENCOUNTER — Encounter: Payer: Self-pay | Admitting: Pharmacist

## 2024-03-13 NOTE — Progress Notes (Signed)
" ° °  03/13/2024 Name: Catherine Snyder MRN: [id number] DOB: 1940-08-23  Chief Complaint  Patient presents with   Medication Adherence    Atorvastatin     Pharmacy Quality Measure Review  This patient is appearing on a report for being at risk of failing the adherence measure for cholesterol (statin) medications this calendar year.   Medication: Atorvastatin   Last fill date: 03/03/24 for 90 day supply    [name], PharmD, BCACP Clinical Pharmacist [phone]   "

## 2024-04-17 ENCOUNTER — Encounter: Admitting: Family

## 2024-04-17 NOTE — Progress Notes (Signed)
   This encounter was created in error - please disregard. No show
